# Patient Record
Sex: Female | Born: 1945 | Race: White | Hispanic: No | Smoking: Former smoker
Health system: Southern US, Community
[De-identification: ages and names within clinical notes are randomized; demographics above are authoritative.]

## PROBLEM LIST (undated history)

## (undated) DIAGNOSIS — Z972 Presence of dental prosthetic device (complete) (partial): Secondary | ICD-10-CM

## (undated) DIAGNOSIS — J309 Allergic rhinitis, unspecified: Secondary | ICD-10-CM

## (undated) DIAGNOSIS — I639 Cerebral infarction, unspecified: Secondary | ICD-10-CM

## (undated) DIAGNOSIS — I10 Essential (primary) hypertension: Secondary | ICD-10-CM

## (undated) DIAGNOSIS — J189 Pneumonia, unspecified organism: Secondary | ICD-10-CM

## (undated) DIAGNOSIS — R7303 Prediabetes: Secondary | ICD-10-CM

## (undated) DIAGNOSIS — D6851 Activated protein C resistance: Secondary | ICD-10-CM

## (undated) DIAGNOSIS — C801 Malignant (primary) neoplasm, unspecified: Secondary | ICD-10-CM

## (undated) DIAGNOSIS — R011 Cardiac murmur, unspecified: Secondary | ICD-10-CM

## (undated) DIAGNOSIS — E669 Obesity, unspecified: Secondary | ICD-10-CM

## (undated) DIAGNOSIS — E785 Hyperlipidemia, unspecified: Secondary | ICD-10-CM

## (undated) DIAGNOSIS — Z8489 Family history of other specified conditions: Secondary | ICD-10-CM

## (undated) DIAGNOSIS — R760 Raised antibody titer: Secondary | ICD-10-CM

## (undated) DIAGNOSIS — D759 Disease of blood and blood-forming organs, unspecified: Secondary | ICD-10-CM

## (undated) DIAGNOSIS — D689 Coagulation defect, unspecified: Secondary | ICD-10-CM

## (undated) DIAGNOSIS — E119 Type 2 diabetes mellitus without complications: Secondary | ICD-10-CM

## (undated) HISTORY — DX: Obesity, unspecified: E66.9

## (undated) HISTORY — DX: Allergic rhinitis, unspecified: J30.9

## (undated) HISTORY — DX: Hyperlipidemia, unspecified: E78.5

## (undated) HISTORY — DX: Raised antibody titer: R76.0

## (undated) HISTORY — PX: APPENDECTOMY: SHX54

## (undated) HISTORY — DX: Cerebral infarction, unspecified: I63.9

## (undated) HISTORY — DX: Type 2 diabetes mellitus without complications: E11.9

## (undated) HISTORY — DX: Coagulation defect, unspecified: D68.9

## (undated) HISTORY — DX: Essential (primary) hypertension: I10

## (undated) HISTORY — PX: DILATION AND CURETTAGE OF UTERUS: SHX78

---

## 1979-10-15 HISTORY — PX: ABDOMINAL HYSTERECTOMY: SHX81

## 2008-10-14 DIAGNOSIS — I639 Cerebral infarction, unspecified: Secondary | ICD-10-CM

## 2008-10-14 HISTORY — DX: Cerebral infarction, unspecified: I63.9

## 2009-08-30 ENCOUNTER — Inpatient Hospital Stay (HOSPITAL_COMMUNITY): Admission: EM | Admit: 2009-08-30 | Discharge: 2009-09-05 | Payer: Self-pay | Admitting: Emergency Medicine

## 2009-08-31 ENCOUNTER — Encounter (INDEPENDENT_AMBULATORY_CARE_PROVIDER_SITE_OTHER): Payer: Self-pay | Admitting: Internal Medicine

## 2009-09-01 ENCOUNTER — Encounter (INDEPENDENT_AMBULATORY_CARE_PROVIDER_SITE_OTHER): Payer: Self-pay | Admitting: Internal Medicine

## 2009-09-01 ENCOUNTER — Ambulatory Visit: Payer: Self-pay | Admitting: Physical Medicine & Rehabilitation

## 2009-09-05 ENCOUNTER — Ambulatory Visit: Payer: Self-pay | Admitting: Physical Medicine & Rehabilitation

## 2009-09-05 ENCOUNTER — Encounter (INDEPENDENT_AMBULATORY_CARE_PROVIDER_SITE_OTHER): Payer: Self-pay | Admitting: Internal Medicine

## 2009-09-05 ENCOUNTER — Ambulatory Visit: Payer: Self-pay | Admitting: Cardiology

## 2009-09-05 ENCOUNTER — Inpatient Hospital Stay (HOSPITAL_COMMUNITY)
Admission: RE | Admit: 2009-09-05 | Discharge: 2009-09-20 | Payer: Self-pay | Admitting: Physical Medicine & Rehabilitation

## 2009-09-14 ENCOUNTER — Ambulatory Visit: Payer: Self-pay | Admitting: Psychology

## 2009-10-18 ENCOUNTER — Encounter
Admission: RE | Admit: 2009-10-18 | Discharge: 2010-01-16 | Payer: Self-pay | Admitting: Physical Medicine & Rehabilitation

## 2009-10-18 ENCOUNTER — Ambulatory Visit: Payer: Self-pay | Admitting: Physical Medicine & Rehabilitation

## 2009-11-08 ENCOUNTER — Ambulatory Visit: Payer: Self-pay | Admitting: Psychology

## 2009-11-29 ENCOUNTER — Ambulatory Visit: Payer: Self-pay | Admitting: Physical Medicine & Rehabilitation

## 2010-07-09 IMAGING — CR DG WRIST 2V*R*
2 series · 2 of 2 positions shown · non-contrast
Comparison: 09/01/2009

CLINICAL DATA: Fracture

RIGHT WRIST - 2 VIEW

[x wrist pa right *]
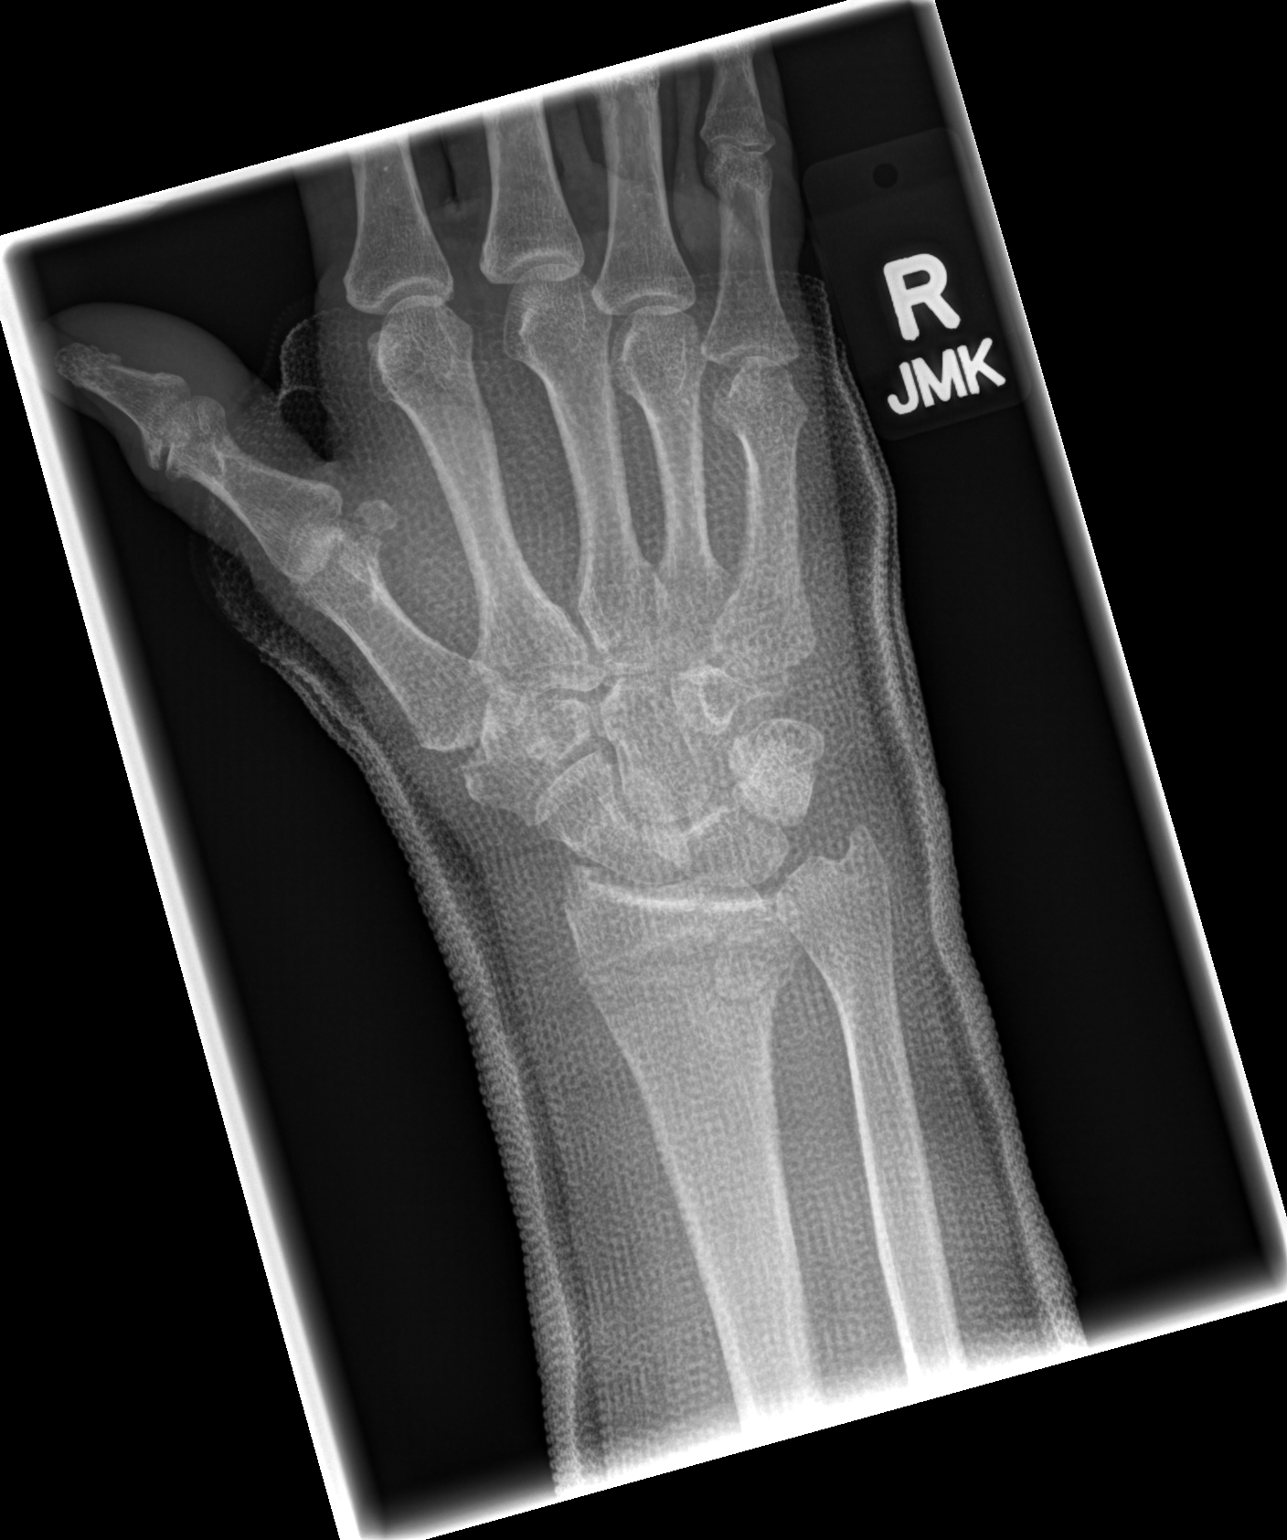

[x wrist lat right *]
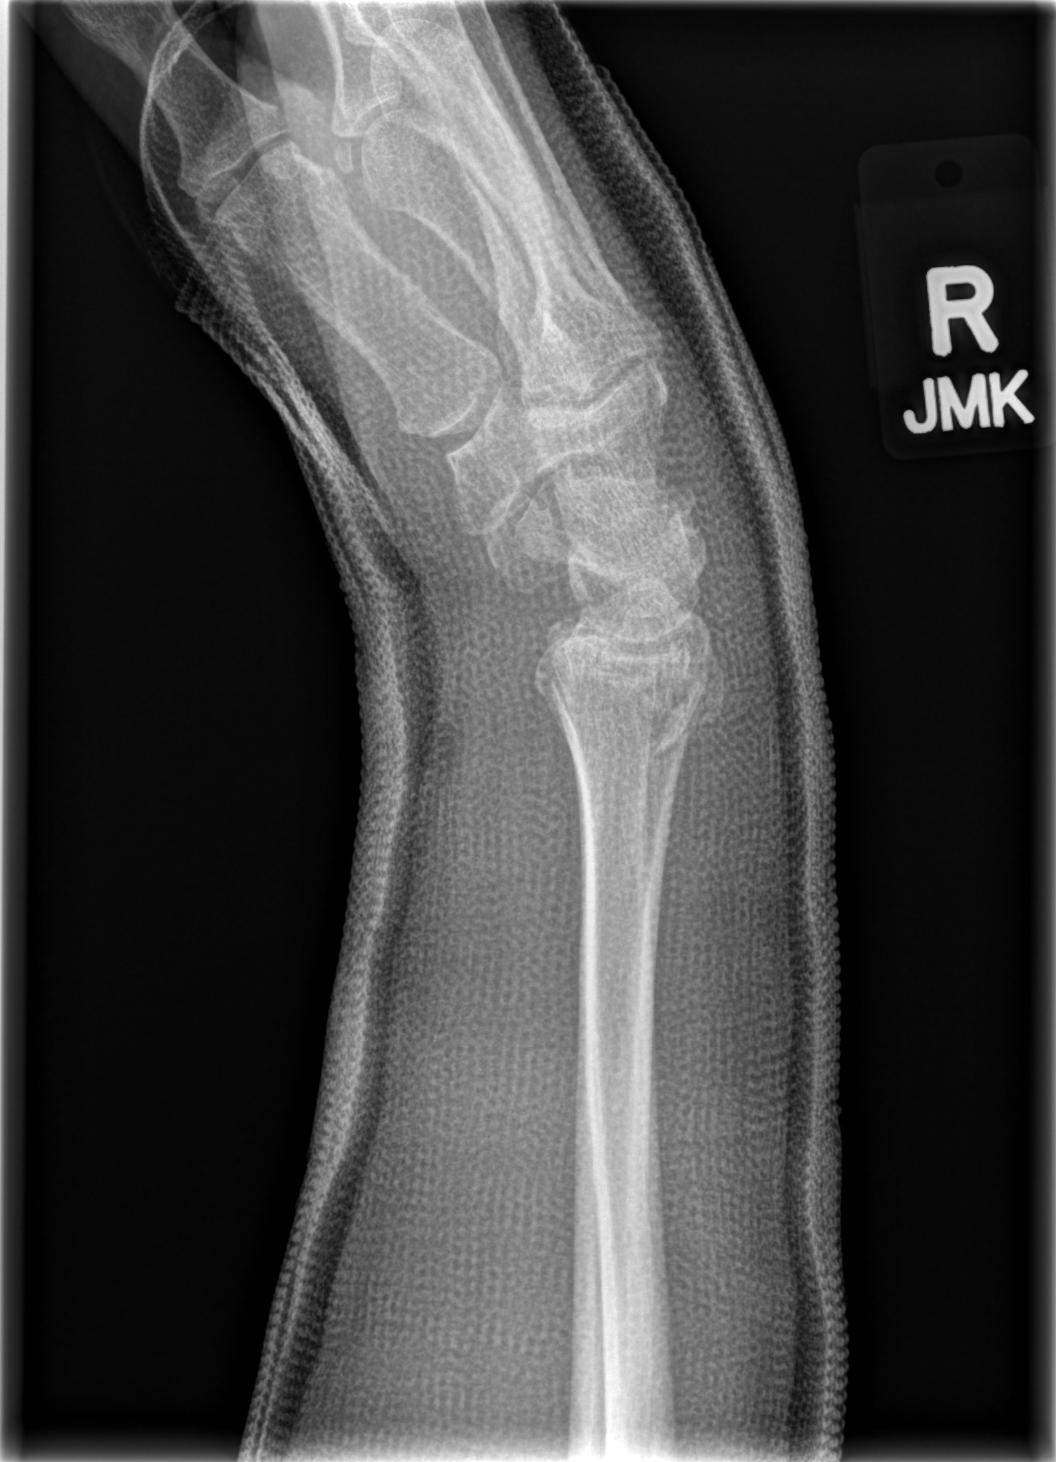

[2 of 2 positions shown; findings below may reference images not displayed]

FINDINGS: Cast material obscures fine bone detail.  The comminuted
distal right radial metaphyseal fracture is in near anatomic
alignment with neutral angulation of the distal radial articular
surface.  Carpal rows appear intact.  Diffuse osteopenia.
IMPRESSION: Stable fixation of distal radial fracture

## 2010-11-04 ENCOUNTER — Encounter: Payer: Self-pay | Admitting: Physical Medicine & Rehabilitation

## 2011-01-07 ENCOUNTER — Emergency Department: Payer: Self-pay | Admitting: Emergency Medicine

## 2011-01-15 LAB — PROTIME-INR
INR: 2.16 — ABNORMAL HIGH (ref 0.00–1.49)
INR: 2.17 — ABNORMAL HIGH (ref 0.00–1.49)
INR: 2.56 — ABNORMAL HIGH (ref 0.00–1.49)
INR: 2.89 — ABNORMAL HIGH (ref 0.00–1.49)
INR: 3.01 — ABNORMAL HIGH (ref 0.00–1.49)
Prothrombin Time: 23.9 seconds — ABNORMAL HIGH (ref 11.6–15.2)
Prothrombin Time: 24 seconds — ABNORMAL HIGH (ref 11.6–15.2)
Prothrombin Time: 25.8 seconds — ABNORMAL HIGH (ref 11.6–15.2)
Prothrombin Time: 26.1 seconds — ABNORMAL HIGH (ref 11.6–15.2)
Prothrombin Time: 27.1 seconds — ABNORMAL HIGH (ref 11.6–15.2)
Prothrombin Time: 30 seconds — ABNORMAL HIGH (ref 11.6–15.2)
Prothrombin Time: 31 seconds — ABNORMAL HIGH (ref 11.6–15.2)

## 2011-01-15 LAB — CBC
MCHC: 34.2 g/dL (ref 30.0–36.0)
MCV: 95.3 fL (ref 78.0–100.0)
RBC: 4.23 MIL/uL (ref 3.87–5.11)
RDW: 13.1 % (ref 11.5–15.5)

## 2011-01-16 LAB — URINE CULTURE: Colony Count: >=100000

## 2011-01-16 LAB — PROTIME-INR
INR: 1.06 (ref 0.00–1.49)
INR: 1.31 (ref 0.00–1.49)
INR: 1.85 — ABNORMAL HIGH (ref 0.00–1.49)
INR: 2.61 — ABNORMAL HIGH (ref 0.00–1.49)
Prothrombin Time: 12.9 seconds (ref 11.6–15.2)
Prothrombin Time: 13.7 seconds (ref 11.6–15.2)
Prothrombin Time: 16.2 seconds — ABNORMAL HIGH (ref 11.6–15.2)
Prothrombin Time: 22.3 seconds — ABNORMAL HIGH (ref 11.6–15.2)

## 2011-01-16 LAB — CBC
HCT: 39.6 % (ref 36.0–46.0)
HCT: 40.3 % (ref 36.0–46.0)
HCT: 43 % (ref 36.0–46.0)
Hemoglobin: 13.4 g/dL (ref 12.0–15.0)
Hemoglobin: 13.6 g/dL (ref 12.0–15.0)
Hemoglobin: 13.8 g/dL (ref 12.0–15.0)
Hemoglobin: 14.1 g/dL (ref 12.0–15.0)
MCHC: 33.8 g/dL (ref 30.0–36.0)
MCHC: 34.2 g/dL (ref 30.0–36.0)
MCHC: 34.2 g/dL (ref 30.0–36.0)
MCHC: 34.3 g/dL (ref 30.0–36.0)
MCV: 95.8 fL (ref 78.0–100.0)
MCV: 95.9 fL (ref 78.0–100.0)
MCV: 96.3 fL (ref 78.0–100.0)
Platelets: 214 10*3/uL (ref 150–400)
RBC: 4.06 MIL/uL (ref 3.87–5.11)
RBC: 4.13 MIL/uL (ref 3.87–5.11)
RBC: 4.18 MIL/uL (ref 3.87–5.11)
RBC: 4.31 MIL/uL (ref 3.87–5.11)
RBC: 4.45 MIL/uL (ref 3.87–5.11)
RDW: 12.9 % (ref 11.5–15.5)
RDW: 13.2 % (ref 11.5–15.5)
RDW: 13.2 % (ref 11.5–15.5)
WBC: 8.5 10*3/uL (ref 4.0–10.5)
WBC: 8.7 10*3/uL (ref 4.0–10.5)
WBC: 9 10*3/uL (ref 4.0–10.5)
WBC: 9.4 10*3/uL (ref 4.0–10.5)

## 2011-01-16 LAB — LIPID PANEL: VLDL: 16 mg/dL (ref 0–40)

## 2011-01-16 LAB — URINALYSIS, ROUTINE W REFLEX MICROSCOPIC
Bilirubin Urine: NEGATIVE
Glucose, UA: NEGATIVE mg/dL
Hgb urine dipstick: NEGATIVE
Ketones, ur: 40 mg/dL — AB
Ketones, ur: NEGATIVE mg/dL
Nitrite: POSITIVE — AB
Protein, ur: NEGATIVE mg/dL
Specific Gravity, Urine: 1.013 (ref 1.005–1.030)
Urobilinogen, UA: 1 mg/dL (ref 0.0–1.0)
pH: 7 (ref 5.0–8.0)

## 2011-01-16 LAB — COMPREHENSIVE METABOLIC PANEL
ALT: 31 U/L (ref 0–35)
AST: 21 U/L (ref 0–37)
AST: 25 U/L (ref 0–37)
Alkaline Phosphatase: 77 U/L (ref 39–117)
BUN: 14 mg/dL (ref 6–23)
CO2: 25 mEq/L (ref 19–32)
CO2: 26 mEq/L (ref 19–32)
CO2: 28 mEq/L (ref 19–32)
Calcium: 8.7 mg/dL (ref 8.4–10.5)
Calcium: 8.9 mg/dL (ref 8.4–10.5)
Calcium: 9.1 mg/dL (ref 8.4–10.5)
Chloride: 107 mEq/L (ref 96–112)
Chloride: 110 mEq/L (ref 96–112)
Creatinine, Ser: 0.7 mg/dL (ref 0.4–1.2)
Creatinine, Ser: 0.71 mg/dL (ref 0.4–1.2)
GFR calc Af Amer: >60 mL/min (ref >60)
GFR calc Af Amer: >60 mL/min (ref >60)
GFR calc non Af Amer: >60 mL/min (ref >60)
GFR calc non Af Amer: >60 mL/min (ref >60)
GFR calc non Af Amer: >60 mL/min (ref >60)
Glucose, Bld: 100 mg/dL — ABNORMAL HIGH (ref 70–99)
Glucose, Bld: 103 mg/dL — ABNORMAL HIGH (ref 70–99)
Potassium: 4.2 mEq/L (ref 3.5–5.1)
Sodium: 142 mEq/L (ref 135–145)
Total Bilirubin: 0.5 mg/dL (ref 0.3–1.2)
Total Bilirubin: 0.5 mg/dL (ref 0.3–1.2)
Total Protein: 6.2 g/dL (ref 6.0–8.3)

## 2011-01-16 LAB — BASIC METABOLIC PANEL
CO2: 24 mEq/L (ref 19–32)
CO2: 24 mEq/L (ref 19–32)
Calcium: 8.2 mg/dL — ABNORMAL LOW (ref 8.4–10.5)
Calcium: 8.5 mg/dL (ref 8.4–10.5)
Creatinine, Ser: 0.77 mg/dL (ref 0.4–1.2)
GFR calc Af Amer: >60 mL/min (ref >60)
GFR calc Af Amer: >60 mL/min (ref >60)
GFR calc non Af Amer: >60 mL/min (ref >60)
GFR calc non Af Amer: >60 mL/min (ref >60)
GFR calc non Af Amer: >60 mL/min (ref >60)
Glucose, Bld: 110 mg/dL — ABNORMAL HIGH (ref 70–99)
Potassium: 3.6 mEq/L (ref 3.5–5.1)
Sodium: 140 mEq/L (ref 135–145)
Sodium: 142 mEq/L (ref 135–145)

## 2011-01-16 LAB — ANA: Anti Nuclear Antibody(ANA): NEGATIVE

## 2011-01-16 LAB — URINE MICROSCOPIC-ADD ON

## 2011-01-16 LAB — POCT I-STAT, CHEM 8
BUN: 16 mg/dL (ref 6–23)
Calcium, Ion: 1.1 mmol/L — ABNORMAL LOW (ref 1.12–1.32)
Creatinine, Ser: 0.7 mg/dL (ref 0.4–1.2)
Hemoglobin: 16 g/dL — ABNORMAL HIGH (ref 12.0–15.0)
Sodium: 141 mEq/L (ref 135–145)
TCO2: 28 mmol/L (ref 0–100)

## 2011-01-16 LAB — BETA-2-GLYCOPROTEIN I ABS, IGG/M/A
Beta-2 Glyco I IgG: <3 U/mL (ref <=15)
Beta-2-Glycoprotein I IgA: <3 U/mL (ref <=15)
Beta-2-Glycoprotein I IgM: 34 U/mL — ABNORMAL HIGH (ref <=15)

## 2011-01-16 LAB — GLUCOSE, CAPILLARY
Glucose-Capillary: 101 mg/dL — ABNORMAL HIGH (ref 70–99)
Glucose-Capillary: 89 mg/dL (ref 70–99)

## 2011-01-16 LAB — CK TOTAL AND CKMB (NOT AT ARMC): Total CK: 1002 U/L — ABNORMAL HIGH (ref 7–177)

## 2011-01-16 LAB — PROTEIN C ACTIVITY: Protein C Activity: 106 % (ref 75–133)

## 2011-01-16 LAB — LUPUS ANTICOAGULANT PANEL
DRVVT: 61.3 secs — ABNORMAL HIGH (ref 34.7–40.5)
Lupus Anticoagulant: DETECTED — AB

## 2011-01-16 LAB — SEDIMENTATION RATE
Sed Rate: 6 mm/hr (ref 0–22)
Sed Rate: 9 mm/hr (ref 0–22)

## 2011-01-16 LAB — FACTOR 5 LEIDEN

## 2011-01-16 LAB — DIFFERENTIAL
Basophils Absolute: 0 10*3/uL (ref 0.0–0.1)
Lymphocytes Relative: 13 % (ref 12–46)
Lymphocytes Relative: 34 % (ref 12–46)
Lymphs Abs: 1.2 10*3/uL (ref 0.7–4.0)
Lymphs Abs: 2.9 10*3/uL (ref 0.7–4.0)
Monocytes Relative: 8 % (ref 3–12)
Neutrophils Relative %: 55 % (ref 43–77)
Neutrophils Relative %: 81 % — ABNORMAL HIGH (ref 43–77)

## 2011-01-16 LAB — PROTEIN S ACTIVITY: Protein S Activity: 70 % (ref 69–129)

## 2011-01-16 LAB — ANTI-SMITH ANTIBODY: ENA SM Ab Ser-aCnc: <0.2 AI (ref <1.0)

## 2011-01-16 LAB — PROTEIN C, TOTAL: Protein C, Total: 95 % (ref 70–140)

## 2011-01-16 LAB — PROTHROMBIN GENE MUTATION

## 2011-01-16 LAB — HEMOGLOBIN A1C: Hgb A1c MFr Bld: 5.9 % (ref 4.6–6.1)

## 2011-01-16 LAB — ANTITHROMBIN III: AntiThromb III Func: 86 % (ref 76–126)

## 2011-01-16 LAB — TROPONIN I: Troponin I: 0.03 ng/mL (ref 0.00–0.06)

## 2011-01-16 LAB — HOMOCYSTEINE: Homocysteine: 7.5 umol/L (ref 4.0–15.4)

## 2011-10-21 DIAGNOSIS — IMO0002 Reserved for concepts with insufficient information to code with codable children: Secondary | ICD-10-CM | POA: Diagnosis not present

## 2011-10-31 DIAGNOSIS — I69959 Hemiplegia and hemiparesis following unspecified cerebrovascular disease affecting unspecified side: Secondary | ICD-10-CM | POA: Diagnosis not present

## 2011-10-31 DIAGNOSIS — Z7901 Long term (current) use of anticoagulants: Secondary | ICD-10-CM | POA: Diagnosis not present

## 2011-10-31 DIAGNOSIS — D689 Coagulation defect, unspecified: Secondary | ICD-10-CM | POA: Diagnosis not present

## 2011-11-20 DIAGNOSIS — E119 Type 2 diabetes mellitus without complications: Secondary | ICD-10-CM | POA: Diagnosis not present

## 2011-11-20 DIAGNOSIS — Z7901 Long term (current) use of anticoagulants: Secondary | ICD-10-CM | POA: Diagnosis not present

## 2011-11-20 DIAGNOSIS — E785 Hyperlipidemia, unspecified: Secondary | ICD-10-CM | POA: Diagnosis not present

## 2011-11-20 DIAGNOSIS — I10 Essential (primary) hypertension: Secondary | ICD-10-CM | POA: Diagnosis not present

## 2011-12-24 DIAGNOSIS — D689 Coagulation defect, unspecified: Secondary | ICD-10-CM | POA: Diagnosis not present

## 2011-12-24 DIAGNOSIS — I69959 Hemiplegia and hemiparesis following unspecified cerebrovascular disease affecting unspecified side: Secondary | ICD-10-CM | POA: Diagnosis not present

## 2011-12-24 DIAGNOSIS — Z7901 Long term (current) use of anticoagulants: Secondary | ICD-10-CM | POA: Diagnosis not present

## 2012-01-23 DIAGNOSIS — D689 Coagulation defect, unspecified: Secondary | ICD-10-CM | POA: Diagnosis not present

## 2012-01-23 DIAGNOSIS — Z7901 Long term (current) use of anticoagulants: Secondary | ICD-10-CM | POA: Diagnosis not present

## 2012-01-23 DIAGNOSIS — I69959 Hemiplegia and hemiparesis following unspecified cerebrovascular disease affecting unspecified side: Secondary | ICD-10-CM | POA: Diagnosis not present

## 2012-04-02 DIAGNOSIS — Z7901 Long term (current) use of anticoagulants: Secondary | ICD-10-CM | POA: Diagnosis not present

## 2012-04-02 DIAGNOSIS — I69959 Hemiplegia and hemiparesis following unspecified cerebrovascular disease affecting unspecified side: Secondary | ICD-10-CM | POA: Diagnosis not present

## 2012-04-02 DIAGNOSIS — D689 Coagulation defect, unspecified: Secondary | ICD-10-CM | POA: Diagnosis not present

## 2012-05-08 DIAGNOSIS — B029 Zoster without complications: Secondary | ICD-10-CM | POA: Diagnosis not present

## 2012-05-08 DIAGNOSIS — Z7901 Long term (current) use of anticoagulants: Secondary | ICD-10-CM | POA: Diagnosis not present

## 2012-05-08 DIAGNOSIS — D689 Coagulation defect, unspecified: Secondary | ICD-10-CM | POA: Diagnosis not present

## 2012-05-08 DIAGNOSIS — I10 Essential (primary) hypertension: Secondary | ICD-10-CM | POA: Diagnosis not present

## 2013-01-27 DIAGNOSIS — IMO0002 Reserved for concepts with insufficient information to code with codable children: Secondary | ICD-10-CM | POA: Diagnosis not present

## 2013-02-02 DIAGNOSIS — IMO0002 Reserved for concepts with insufficient information to code with codable children: Secondary | ICD-10-CM | POA: Diagnosis not present

## 2013-02-04 DIAGNOSIS — IMO0002 Reserved for concepts with insufficient information to code with codable children: Secondary | ICD-10-CM | POA: Diagnosis not present

## 2013-02-08 DIAGNOSIS — IMO0002 Reserved for concepts with insufficient information to code with codable children: Secondary | ICD-10-CM | POA: Diagnosis not present

## 2013-02-15 DIAGNOSIS — IMO0002 Reserved for concepts with insufficient information to code with codable children: Secondary | ICD-10-CM | POA: Diagnosis not present

## 2013-02-17 DIAGNOSIS — IMO0002 Reserved for concepts with insufficient information to code with codable children: Secondary | ICD-10-CM | POA: Diagnosis not present

## 2013-02-22 DIAGNOSIS — IMO0002 Reserved for concepts with insufficient information to code with codable children: Secondary | ICD-10-CM | POA: Diagnosis not present

## 2013-02-24 DIAGNOSIS — IMO0002 Reserved for concepts with insufficient information to code with codable children: Secondary | ICD-10-CM | POA: Diagnosis not present

## 2013-03-01 DIAGNOSIS — IMO0002 Reserved for concepts with insufficient information to code with codable children: Secondary | ICD-10-CM | POA: Diagnosis not present

## 2013-03-16 DIAGNOSIS — IMO0002 Reserved for concepts with insufficient information to code with codable children: Secondary | ICD-10-CM | POA: Diagnosis not present

## 2013-03-23 DIAGNOSIS — IMO0002 Reserved for concepts with insufficient information to code with codable children: Secondary | ICD-10-CM | POA: Diagnosis not present

## 2013-03-25 DIAGNOSIS — IMO0002 Reserved for concepts with insufficient information to code with codable children: Secondary | ICD-10-CM | POA: Diagnosis not present

## 2013-03-25 DIAGNOSIS — I69959 Hemiplegia and hemiparesis following unspecified cerebrovascular disease affecting unspecified side: Secondary | ICD-10-CM | POA: Diagnosis not present

## 2013-03-25 DIAGNOSIS — Z7901 Long term (current) use of anticoagulants: Secondary | ICD-10-CM | POA: Diagnosis not present

## 2013-03-25 DIAGNOSIS — D689 Coagulation defect, unspecified: Secondary | ICD-10-CM | POA: Diagnosis not present

## 2013-03-30 DIAGNOSIS — IMO0002 Reserved for concepts with insufficient information to code with codable children: Secondary | ICD-10-CM | POA: Diagnosis not present

## 2013-04-01 DIAGNOSIS — IMO0002 Reserved for concepts with insufficient information to code with codable children: Secondary | ICD-10-CM | POA: Diagnosis not present

## 2013-04-26 DIAGNOSIS — Z7901 Long term (current) use of anticoagulants: Secondary | ICD-10-CM | POA: Diagnosis not present

## 2013-04-26 DIAGNOSIS — I69959 Hemiplegia and hemiparesis following unspecified cerebrovascular disease affecting unspecified side: Secondary | ICD-10-CM | POA: Diagnosis not present

## 2013-05-25 DIAGNOSIS — D689 Coagulation defect, unspecified: Secondary | ICD-10-CM | POA: Diagnosis not present

## 2013-05-25 DIAGNOSIS — I69959 Hemiplegia and hemiparesis following unspecified cerebrovascular disease affecting unspecified side: Secondary | ICD-10-CM | POA: Diagnosis not present

## 2013-05-25 DIAGNOSIS — Z7901 Long term (current) use of anticoagulants: Secondary | ICD-10-CM | POA: Diagnosis not present

## 2013-06-18 DIAGNOSIS — E119 Type 2 diabetes mellitus without complications: Secondary | ICD-10-CM | POA: Diagnosis not present

## 2013-06-29 DIAGNOSIS — Z23 Encounter for immunization: Secondary | ICD-10-CM | POA: Diagnosis not present

## 2013-06-29 DIAGNOSIS — I69959 Hemiplegia and hemiparesis following unspecified cerebrovascular disease affecting unspecified side: Secondary | ICD-10-CM | POA: Diagnosis not present

## 2013-06-29 DIAGNOSIS — Z7901 Long term (current) use of anticoagulants: Secondary | ICD-10-CM | POA: Diagnosis not present

## 2013-06-29 DIAGNOSIS — D689 Coagulation defect, unspecified: Secondary | ICD-10-CM | POA: Diagnosis not present

## 2013-07-19 ENCOUNTER — Ambulatory Visit: Payer: Self-pay | Admitting: Internal Medicine

## 2013-07-19 DIAGNOSIS — Z1231 Encounter for screening mammogram for malignant neoplasm of breast: Secondary | ICD-10-CM | POA: Diagnosis not present

## 2013-08-03 DIAGNOSIS — I69959 Hemiplegia and hemiparesis following unspecified cerebrovascular disease affecting unspecified side: Secondary | ICD-10-CM | POA: Diagnosis not present

## 2013-08-03 DIAGNOSIS — D689 Coagulation defect, unspecified: Secondary | ICD-10-CM | POA: Diagnosis not present

## 2013-08-03 DIAGNOSIS — Z7901 Long term (current) use of anticoagulants: Secondary | ICD-10-CM | POA: Diagnosis not present

## 2013-09-02 DIAGNOSIS — I69959 Hemiplegia and hemiparesis following unspecified cerebrovascular disease affecting unspecified side: Secondary | ICD-10-CM | POA: Diagnosis not present

## 2013-09-02 DIAGNOSIS — E119 Type 2 diabetes mellitus without complications: Secondary | ICD-10-CM | POA: Diagnosis not present

## 2013-09-02 DIAGNOSIS — I10 Essential (primary) hypertension: Secondary | ICD-10-CM | POA: Diagnosis not present

## 2013-09-02 DIAGNOSIS — Z7901 Long term (current) use of anticoagulants: Secondary | ICD-10-CM | POA: Diagnosis not present

## 2013-09-02 DIAGNOSIS — E785 Hyperlipidemia, unspecified: Secondary | ICD-10-CM | POA: Diagnosis not present

## 2013-09-13 ENCOUNTER — Encounter: Payer: Self-pay | Admitting: Internal Medicine

## 2013-09-13 DIAGNOSIS — R269 Unspecified abnormalities of gait and mobility: Secondary | ICD-10-CM | POA: Diagnosis not present

## 2013-09-13 DIAGNOSIS — E785 Hyperlipidemia, unspecified: Secondary | ICD-10-CM | POA: Diagnosis not present

## 2013-09-13 DIAGNOSIS — Z7901 Long term (current) use of anticoagulants: Secondary | ICD-10-CM | POA: Diagnosis not present

## 2013-09-13 DIAGNOSIS — E669 Obesity, unspecified: Secondary | ICD-10-CM | POA: Diagnosis not present

## 2013-09-13 DIAGNOSIS — Z780 Asymptomatic menopausal state: Secondary | ICD-10-CM | POA: Diagnosis not present

## 2013-09-13 DIAGNOSIS — Z1331 Encounter for screening for depression: Secondary | ICD-10-CM | POA: Diagnosis not present

## 2013-09-13 DIAGNOSIS — E1169 Type 2 diabetes mellitus with other specified complication: Secondary | ICD-10-CM | POA: Diagnosis not present

## 2013-09-13 DIAGNOSIS — Z0000 Encounter for general adult medical examination without abnormal findings: Secondary | ICD-10-CM | POA: Diagnosis not present

## 2013-09-13 DIAGNOSIS — M25559 Pain in unspecified hip: Secondary | ICD-10-CM | POA: Diagnosis not present

## 2013-09-16 DIAGNOSIS — Z1212 Encounter for screening for malignant neoplasm of rectum: Secondary | ICD-10-CM | POA: Diagnosis not present

## 2013-09-30 DIAGNOSIS — I69959 Hemiplegia and hemiparesis following unspecified cerebrovascular disease affecting unspecified side: Secondary | ICD-10-CM | POA: Diagnosis not present

## 2013-09-30 DIAGNOSIS — D689 Coagulation defect, unspecified: Secondary | ICD-10-CM | POA: Diagnosis not present

## 2013-09-30 DIAGNOSIS — Z7901 Long term (current) use of anticoagulants: Secondary | ICD-10-CM | POA: Diagnosis not present

## 2013-10-10 DIAGNOSIS — L049 Acute lymphadenitis, unspecified: Secondary | ICD-10-CM | POA: Diagnosis not present

## 2013-10-20 DIAGNOSIS — F329 Major depressive disorder, single episode, unspecified: Secondary | ICD-10-CM | POA: Diagnosis not present

## 2013-10-20 DIAGNOSIS — Z7901 Long term (current) use of anticoagulants: Secondary | ICD-10-CM | POA: Diagnosis not present

## 2013-10-20 DIAGNOSIS — F3289 Other specified depressive episodes: Secondary | ICD-10-CM | POA: Diagnosis not present

## 2013-10-20 DIAGNOSIS — E785 Hyperlipidemia, unspecified: Secondary | ICD-10-CM | POA: Diagnosis not present

## 2013-10-20 DIAGNOSIS — I10 Essential (primary) hypertension: Secondary | ICD-10-CM | POA: Diagnosis not present

## 2013-10-20 DIAGNOSIS — Z6831 Body mass index (BMI) 31.0-31.9, adult: Secondary | ICD-10-CM | POA: Diagnosis not present

## 2013-10-20 DIAGNOSIS — D689 Coagulation defect, unspecified: Secondary | ICD-10-CM | POA: Diagnosis not present

## 2013-10-22 ENCOUNTER — Ambulatory Visit: Payer: Self-pay | Admitting: Internal Medicine

## 2013-11-02 DIAGNOSIS — IMO0002 Reserved for concepts with insufficient information to code with codable children: Secondary | ICD-10-CM | POA: Diagnosis not present

## 2013-11-04 DIAGNOSIS — D689 Coagulation defect, unspecified: Secondary | ICD-10-CM | POA: Diagnosis not present

## 2013-11-04 DIAGNOSIS — Z7901 Long term (current) use of anticoagulants: Secondary | ICD-10-CM | POA: Diagnosis not present

## 2013-11-04 DIAGNOSIS — I69959 Hemiplegia and hemiparesis following unspecified cerebrovascular disease affecting unspecified side: Secondary | ICD-10-CM | POA: Diagnosis not present

## 2013-11-04 DIAGNOSIS — IMO0002 Reserved for concepts with insufficient information to code with codable children: Secondary | ICD-10-CM | POA: Diagnosis not present

## 2013-11-09 DIAGNOSIS — IMO0002 Reserved for concepts with insufficient information to code with codable children: Secondary | ICD-10-CM | POA: Diagnosis not present

## 2013-11-11 DIAGNOSIS — IMO0002 Reserved for concepts with insufficient information to code with codable children: Secondary | ICD-10-CM | POA: Diagnosis not present

## 2013-11-16 DIAGNOSIS — IMO0002 Reserved for concepts with insufficient information to code with codable children: Secondary | ICD-10-CM | POA: Diagnosis not present

## 2013-11-18 DIAGNOSIS — IMO0002 Reserved for concepts with insufficient information to code with codable children: Secondary | ICD-10-CM | POA: Diagnosis not present

## 2013-11-23 DIAGNOSIS — IMO0002 Reserved for concepts with insufficient information to code with codable children: Secondary | ICD-10-CM | POA: Diagnosis not present

## 2013-11-25 DIAGNOSIS — IMO0002 Reserved for concepts with insufficient information to code with codable children: Secondary | ICD-10-CM | POA: Diagnosis not present

## 2013-12-08 DIAGNOSIS — IMO0002 Reserved for concepts with insufficient information to code with codable children: Secondary | ICD-10-CM | POA: Diagnosis not present

## 2013-12-14 DIAGNOSIS — Z7901 Long term (current) use of anticoagulants: Secondary | ICD-10-CM | POA: Diagnosis not present

## 2013-12-14 DIAGNOSIS — D689 Coagulation defect, unspecified: Secondary | ICD-10-CM | POA: Diagnosis not present

## 2013-12-14 DIAGNOSIS — I69959 Hemiplegia and hemiparesis following unspecified cerebrovascular disease affecting unspecified side: Secondary | ICD-10-CM | POA: Diagnosis not present

## 2013-12-14 DIAGNOSIS — IMO0002 Reserved for concepts with insufficient information to code with codable children: Secondary | ICD-10-CM | POA: Diagnosis not present

## 2013-12-16 DIAGNOSIS — IMO0002 Reserved for concepts with insufficient information to code with codable children: Secondary | ICD-10-CM | POA: Diagnosis not present

## 2013-12-28 DIAGNOSIS — IMO0002 Reserved for concepts with insufficient information to code with codable children: Secondary | ICD-10-CM | POA: Diagnosis not present

## 2013-12-30 DIAGNOSIS — IMO0002 Reserved for concepts with insufficient information to code with codable children: Secondary | ICD-10-CM | POA: Diagnosis not present

## 2014-01-05 DIAGNOSIS — IMO0002 Reserved for concepts with insufficient information to code with codable children: Secondary | ICD-10-CM | POA: Diagnosis not present

## 2014-01-06 DIAGNOSIS — IMO0002 Reserved for concepts with insufficient information to code with codable children: Secondary | ICD-10-CM | POA: Diagnosis not present

## 2014-01-11 DIAGNOSIS — IMO0002 Reserved for concepts with insufficient information to code with codable children: Secondary | ICD-10-CM | POA: Diagnosis not present

## 2014-01-13 DIAGNOSIS — Z7901 Long term (current) use of anticoagulants: Secondary | ICD-10-CM | POA: Diagnosis not present

## 2014-01-13 DIAGNOSIS — IMO0002 Reserved for concepts with insufficient information to code with codable children: Secondary | ICD-10-CM | POA: Diagnosis not present

## 2014-01-13 DIAGNOSIS — I69959 Hemiplegia and hemiparesis following unspecified cerebrovascular disease affecting unspecified side: Secondary | ICD-10-CM | POA: Diagnosis not present

## 2014-01-13 DIAGNOSIS — D689 Coagulation defect, unspecified: Secondary | ICD-10-CM | POA: Diagnosis not present

## 2014-01-18 DIAGNOSIS — IMO0002 Reserved for concepts with insufficient information to code with codable children: Secondary | ICD-10-CM | POA: Diagnosis not present

## 2014-02-03 DIAGNOSIS — Z7901 Long term (current) use of anticoagulants: Secondary | ICD-10-CM | POA: Diagnosis not present

## 2014-03-09 DIAGNOSIS — L0292 Furuncle, unspecified: Secondary | ICD-10-CM | POA: Diagnosis not present

## 2014-03-09 DIAGNOSIS — Z7901 Long term (current) use of anticoagulants: Secondary | ICD-10-CM | POA: Diagnosis not present

## 2014-03-09 DIAGNOSIS — Z6832 Body mass index (BMI) 32.0-32.9, adult: Secondary | ICD-10-CM | POA: Diagnosis not present

## 2014-03-09 DIAGNOSIS — I69959 Hemiplegia and hemiparesis following unspecified cerebrovascular disease affecting unspecified side: Secondary | ICD-10-CM | POA: Diagnosis not present

## 2014-03-09 DIAGNOSIS — L0293 Carbuncle, unspecified: Secondary | ICD-10-CM | POA: Diagnosis not present

## 2014-03-09 DIAGNOSIS — I10 Essential (primary) hypertension: Secondary | ICD-10-CM | POA: Diagnosis not present

## 2014-03-14 ENCOUNTER — Encounter (INDEPENDENT_AMBULATORY_CARE_PROVIDER_SITE_OTHER): Payer: Self-pay | Admitting: General Surgery

## 2014-03-14 ENCOUNTER — Ambulatory Visit (INDEPENDENT_AMBULATORY_CARE_PROVIDER_SITE_OTHER): Payer: Medicare Other | Admitting: General Surgery

## 2014-03-14 VITALS — BP 130/76 | HR 80 | Temp 97.5°F | Ht 63.0 in | Wt 182.0 lb

## 2014-03-14 DIAGNOSIS — IMO0002 Reserved for concepts with insufficient information to code with codable children: Secondary | ICD-10-CM

## 2014-03-14 DIAGNOSIS — L02419 Cutaneous abscess of limb, unspecified: Secondary | ICD-10-CM

## 2014-03-14 NOTE — Progress Notes (Signed)
Patient ID: [name], female   DOB: [birth year], 68 y.o.   MRN: [id number]  Chief Complaint  Patient presents with  . left arm abscess    HPI [name] is a 68 y.o. female.  Referred by Dr Crist Infante HPI This is a 68 year old female is on Coumadin for a clotting disorder. She has a history of couple of times of a left axillary abscess that has been drained. This recurs every time. The last time she still had a marble-sized area that was present. Last Tuesday she began noticing this area again. This has  increased in size and become increasingly tender. She was attempting some local therapy but has not been successful. She comes in today to discuss drainage. Past Medical History  Diagnosis Date  . Clotting disorder   . Diabetes mellitus without complication   . Hyperlipidemia   . Hypertension   . Stroke     Past Surgical History  Procedure Laterality Date  . Abdominal hysterectomy      Family History  Problem Relation Age of Onset  . Heart disease Mother   . COPD Father     Social History History  Substance Use Topics  . Smoking status: Former Smoker    Types: Cigarettes    Quit date: 03/14/2009  . Smokeless tobacco: Not on file  . Alcohol Use: No    Allergies  Allergen Reactions  . Codeine     Current Outpatient Prescriptions  Medication Sig Dispense Refill  . amLODipine (NORVASC) 5 MG tablet Take 5 mg by mouth daily.      Marland Kitchen aspirin 81 MG tablet Take 81 mg by mouth daily.      Marland Kitchen ezetimibe (ZETIA) 10 MG tablet Take 10 mg by mouth daily.      Marland Kitchen lisinopril (PRINIVIL,ZESTRIL) 10 MG tablet Take 10 mg by mouth daily.      . simvastatin (ZOCOR) 20 MG tablet Take 20 mg by mouth daily.       No current facility-administered medications for this visit.    Review of Systems Review of Systems  Constitutional: Negative for fever, chills and unexpected weight change.  HENT: Negative for congestion, hearing loss, sore throat, trouble swallowing and voice change.     Eyes: Negative for visual disturbance.  Respiratory: Negative for cough and wheezing.   Cardiovascular: Negative for chest pain, palpitations and leg swelling.  Gastrointestinal: Negative for nausea, vomiting, abdominal pain, diarrhea, constipation, blood in stool, abdominal distention and anal bleeding.  Genitourinary: Negative for hematuria, vaginal bleeding and difficulty urinating.  Musculoskeletal: Negative for arthralgias.  Skin: Negative for rash and wound.  Neurological: Negative for seizures, syncope and headaches.  Hematological: Negative for adenopathy. Does not bruise/bleed easily.  Psychiatric/Behavioral: Negative for confusion.    Blood pressure 130/76, pulse 80, temperature 97.5 F (36.4 C), height 5\' 3"  (1.6 m), weight 182 lb (82.555 kg).  Physical Exam Physical Exam  Vitals reviewed. Constitutional: She appears well-developed and well-nourished.  Pulmonary/Chest:      Data Reviewed Dr Beckie Salts notes  Assessment    Left axillary abscess     Plan    I discussed incision and drainage. I think it would be worse to stop her Coumadin as this should be fairly limited. I then cleansed the area. I then injected lidocaine. I then made a cruciate incision entering the contents which were.. This was then packed. A dressing was placed. She's going to remove the packing on Wednesday. I will then follow her up  in a couple weeks. I do think that she's going to need what appears to be a sebaceous cyst excised at some point also.        [name] 03/14/2014, 4:21 PM

## 2014-04-12 ENCOUNTER — Telehealth (INDEPENDENT_AMBULATORY_CARE_PROVIDER_SITE_OTHER): Payer: Self-pay | Admitting: General Surgery

## 2014-04-12 ENCOUNTER — Ambulatory Visit (INDEPENDENT_AMBULATORY_CARE_PROVIDER_SITE_OTHER): Payer: Medicare Other | Admitting: General Surgery

## 2014-04-12 ENCOUNTER — Encounter (INDEPENDENT_AMBULATORY_CARE_PROVIDER_SITE_OTHER): Payer: Self-pay | Admitting: General Surgery

## 2014-04-12 ENCOUNTER — Encounter (INDEPENDENT_AMBULATORY_CARE_PROVIDER_SITE_OTHER): Payer: Self-pay

## 2014-04-12 VITALS — BP 122/80 | HR 76 | Temp 97.0°F | Ht 63.0 in | Wt 179.0 lb

## 2014-04-12 DIAGNOSIS — L02419 Cutaneous abscess of limb, unspecified: Secondary | ICD-10-CM

## 2014-04-12 DIAGNOSIS — IMO0002 Reserved for concepts with insufficient information to code with codable children: Secondary | ICD-10-CM | POA: Diagnosis not present

## 2014-04-12 DIAGNOSIS — Z7901 Long term (current) use of anticoagulants: Secondary | ICD-10-CM | POA: Diagnosis not present

## 2014-04-12 NOTE — Progress Notes (Signed)
Subjective:     Patient ID: [name], female   DOB: 01-21-1946, 68 y.o.   MRN: [id number]  HPI This is a 68 year old female has a history of a clotting disorder as well as a stroke on Coumadin who presents after undergoing incision and drainage of a left axillary abscess. She's doing well after I did that. This has happened 3 times now. She returns today to discuss definitive therapy.  Review of Systems     Objective:   Physical Exam    Healed left axillary incision with no evidence of infection, there appeared to be about a 2 cm cyst which is likely a sebaceous cyst underlying the incision Assessment:     Status post incision and drainage of left axillary abscess, likely sebaceous cyst     Plan:     We discussed observation versus surgery. I think to prevent this from coming back again that she will need to have this area removed. We discussed excision under local anesthesia with monitored anesthesia care. I discussed the risks including bleeding, infection, recurrence. Were going to plan on doing this in August all discussed with Dr. Joylene Draft management of her anti-coagulation.

## 2014-04-12 NOTE — Telephone Encounter (Signed)
[name], from Dr. Silvestre Mesi office, called to let us know that we need to go ahead and get the patient scheduled for a date to remove the axillary abscess and then we need to call their office so they can put the patient in for them to see 6-8 days prior to in order to start her lovenox bridge.  Informed them that I would pass this message along to Lars Mage so that they can start the order forms and get a date for them.

## 2014-04-12 NOTE — Telephone Encounter (Signed)
LMOM for pt to call me b/c I want to give her the message below from Dr Perini's office and let her know that her surgical orders have been turned into scheduling.

## 2014-04-27 ENCOUNTER — Other Ambulatory Visit (INDEPENDENT_AMBULATORY_CARE_PROVIDER_SITE_OTHER): Payer: Self-pay | Admitting: General Surgery

## 2014-04-28 ENCOUNTER — Encounter (INDEPENDENT_AMBULATORY_CARE_PROVIDER_SITE_OTHER): Payer: Self-pay

## 2014-04-28 NOTE — Telephone Encounter (Signed)
LMOM with Estill Bamberg at Dr Perini's office's giving them the surgery date for pt on 05/16/14 with Dr Donne Hazel b/c they wanted to know in order to arrange the Lovenox bridge on pt.

## 2014-04-28 NOTE — Telephone Encounter (Signed)
LMOM for pt notifying her that I did call Dr Perini's office with the surgery date and that she should be getting a call from their office about arranging the Lovenox bridge. I advised pt that if she does not hear from Dr Perini's office in the next few days for her to call Dr Perini's office.

## 2014-05-10 ENCOUNTER — Encounter (HOSPITAL_BASED_OUTPATIENT_CLINIC_OR_DEPARTMENT_OTHER): Payer: Self-pay | Admitting: *Deleted

## 2014-05-10 DIAGNOSIS — D689 Coagulation defect, unspecified: Secondary | ICD-10-CM | POA: Diagnosis not present

## 2014-05-10 DIAGNOSIS — Z7901 Long term (current) use of anticoagulants: Secondary | ICD-10-CM | POA: Diagnosis not present

## 2014-05-10 DIAGNOSIS — I69959 Hemiplegia and hemiparesis following unspecified cerebrovascular disease affecting unspecified side: Secondary | ICD-10-CM | POA: Diagnosis not present

## 2014-05-10 NOTE — Progress Notes (Signed)
Pt hx cva-but lives independent living in [place] She drives and comes to [facility] for her doctors She will bridge lovenox -coumadin herself-saw dr perrini today To come Friday for labs pt

## 2014-05-13 ENCOUNTER — Encounter (HOSPITAL_BASED_OUTPATIENT_CLINIC_OR_DEPARTMENT_OTHER): Payer: Self-pay | Admitting: *Deleted

## 2014-05-13 ENCOUNTER — Encounter (HOSPITAL_BASED_OUTPATIENT_CLINIC_OR_DEPARTMENT_OTHER)
Admission: RE | Admit: 2014-05-13 | Discharge: 2014-05-13 | Disposition: A | Discharge status: Home / Self Care | Payer: Medicare Other | Source: Ambulatory Visit | Attending: General Surgery | Admitting: General Surgery

## 2014-05-13 DIAGNOSIS — E785 Hyperlipidemia, unspecified: Secondary | ICD-10-CM | POA: Diagnosis not present

## 2014-05-13 DIAGNOSIS — Z7901 Long term (current) use of anticoagulants: Secondary | ICD-10-CM | POA: Diagnosis not present

## 2014-05-13 DIAGNOSIS — Z01812 Encounter for preprocedural laboratory examination: Secondary | ICD-10-CM | POA: Diagnosis not present

## 2014-05-13 DIAGNOSIS — M216X9 Other acquired deformities of unspecified foot: Secondary | ICD-10-CM | POA: Diagnosis not present

## 2014-05-13 DIAGNOSIS — Z7982 Long term (current) use of aspirin: Secondary | ICD-10-CM | POA: Diagnosis not present

## 2014-05-13 DIAGNOSIS — Z8673 Personal history of transient ischemic attack (TIA), and cerebral infarction without residual deficits: Secondary | ICD-10-CM | POA: Diagnosis not present

## 2014-05-13 DIAGNOSIS — Z87891 Personal history of nicotine dependence: Secondary | ICD-10-CM | POA: Diagnosis not present

## 2014-05-13 DIAGNOSIS — E119 Type 2 diabetes mellitus without complications: Secondary | ICD-10-CM | POA: Diagnosis not present

## 2014-05-13 DIAGNOSIS — L723 Sebaceous cyst: Secondary | ICD-10-CM | POA: Diagnosis not present

## 2014-05-13 DIAGNOSIS — Z01810 Encounter for preprocedural cardiovascular examination: Secondary | ICD-10-CM | POA: Diagnosis not present

## 2014-05-13 DIAGNOSIS — I10 Essential (primary) hypertension: Secondary | ICD-10-CM | POA: Diagnosis not present

## 2014-05-13 LAB — BASIC METABOLIC PANEL
ANION GAP: 13 (ref 5–15)
BUN: 14 mg/dL (ref 6–23)
CALCIUM: 9.1 mg/dL (ref 8.4–10.5)
CO2: 24 meq/L (ref 19–32)
CREATININE: 0.77 mg/dL (ref 0.50–1.10)
Chloride: 102 mEq/L (ref 96–112)
GFR calc non Af Amer: 84 mL/min — ABNORMAL LOW (ref >90)
Glucose, Bld: 91 mg/dL (ref 70–99)
Potassium: 4.8 mEq/L (ref 3.7–5.3)
SODIUM: 139 meq/L (ref 137–147)

## 2014-05-13 LAB — CBC WITH DIFFERENTIAL/PLATELET
BASOS ABS: 0 10*3/uL (ref 0.0–0.1)
BASOS PCT: 0 % (ref 0–1)
Eosinophils Absolute: 0.1 10*3/uL (ref 0.0–0.7)
Eosinophils Relative: 1 % (ref 0–5)
HCT: 44.9 % (ref 36.0–46.0)
Hemoglobin: 14.6 g/dL (ref 12.0–15.0)
LYMPHS PCT: 38 % (ref 12–46)
Lymphs Abs: 2.6 10*3/uL (ref 0.7–4.0)
MCH: 31 pg (ref 26.0–34.0)
MCHC: 32.5 g/dL (ref 30.0–36.0)
MCV: 95.3 fL (ref 78.0–100.0)
Monocytes Absolute: 0.7 10*3/uL (ref 0.1–1.0)
Monocytes Relative: 10 % (ref 3–12)
NEUTROS ABS: 3.6 10*3/uL (ref 1.7–7.7)
NEUTROS PCT: 51 % (ref 43–77)
PLATELETS: 241 10*3/uL (ref 150–400)
RBC: 4.71 MIL/uL (ref 3.87–5.11)
RDW: 13.7 % (ref 11.5–15.5)
WBC: 7 10*3/uL (ref 4.0–10.5)

## 2014-05-13 LAB — PROTIME-INR
INR: 1.88 — AB (ref 0.00–1.49)
PROTHROMBIN TIME: 21.6 s — AB (ref 11.6–15.2)

## 2014-05-16 ENCOUNTER — Ambulatory Visit (HOSPITAL_BASED_OUTPATIENT_CLINIC_OR_DEPARTMENT_OTHER): Payer: Medicare Other | Admitting: Anesthesiology

## 2014-05-16 ENCOUNTER — Telehealth (INDEPENDENT_AMBULATORY_CARE_PROVIDER_SITE_OTHER): Payer: Self-pay

## 2014-05-16 ENCOUNTER — Encounter (HOSPITAL_BASED_OUTPATIENT_CLINIC_OR_DEPARTMENT_OTHER): Payer: Self-pay | Admitting: *Deleted

## 2014-05-16 ENCOUNTER — Ambulatory Visit (HOSPITAL_BASED_OUTPATIENT_CLINIC_OR_DEPARTMENT_OTHER)
Admission: RE | Admit: 2014-05-16 | Discharge: 2014-05-16 | Disposition: A | Discharge status: Home / Self Care | Payer: Medicare Other | Source: Ambulatory Visit | Attending: General Surgery | Admitting: General Surgery

## 2014-05-16 ENCOUNTER — Encounter (HOSPITAL_BASED_OUTPATIENT_CLINIC_OR_DEPARTMENT_OTHER): Payer: Medicare Other | Admitting: Anesthesiology

## 2014-05-16 ENCOUNTER — Encounter (HOSPITAL_BASED_OUTPATIENT_CLINIC_OR_DEPARTMENT_OTHER)
Admission: RE | Disposition: A | Discharge status: Home / Self Care | Payer: Self-pay | Source: Ambulatory Visit | Attending: General Surgery

## 2014-05-16 DIAGNOSIS — Z87891 Personal history of nicotine dependence: Secondary | ICD-10-CM | POA: Insufficient documentation

## 2014-05-16 DIAGNOSIS — R229 Localized swelling, mass and lump, unspecified: Secondary | ICD-10-CM | POA: Diagnosis not present

## 2014-05-16 DIAGNOSIS — Z7982 Long term (current) use of aspirin: Secondary | ICD-10-CM | POA: Insufficient documentation

## 2014-05-16 DIAGNOSIS — L723 Sebaceous cyst: Secondary | ICD-10-CM | POA: Insufficient documentation

## 2014-05-16 DIAGNOSIS — I10 Essential (primary) hypertension: Secondary | ICD-10-CM | POA: Insufficient documentation

## 2014-05-16 DIAGNOSIS — Z01812 Encounter for preprocedural laboratory examination: Secondary | ICD-10-CM | POA: Insufficient documentation

## 2014-05-16 DIAGNOSIS — Z01810 Encounter for preprocedural cardiovascular examination: Secondary | ICD-10-CM | POA: Diagnosis not present

## 2014-05-16 DIAGNOSIS — Z8673 Personal history of transient ischemic attack (TIA), and cerebral infarction without residual deficits: Secondary | ICD-10-CM | POA: Diagnosis not present

## 2014-05-16 DIAGNOSIS — E119 Type 2 diabetes mellitus without complications: Secondary | ICD-10-CM | POA: Insufficient documentation

## 2014-05-16 DIAGNOSIS — E785 Hyperlipidemia, unspecified: Secondary | ICD-10-CM | POA: Insufficient documentation

## 2014-05-16 DIAGNOSIS — Z7901 Long term (current) use of anticoagulants: Secondary | ICD-10-CM | POA: Insufficient documentation

## 2014-05-16 DIAGNOSIS — M216X9 Other acquired deformities of unspecified foot: Secondary | ICD-10-CM | POA: Insufficient documentation

## 2014-05-16 HISTORY — PX: AXILLARY LYMPH NODE BIOPSY: SHX5737

## 2014-05-16 HISTORY — DX: Cardiac murmur, unspecified: R01.1

## 2014-05-16 SURGERY — AXILLARY LYMPH NODE BIOPSY
Anesthesia: Monitor Anesthesia Care | Site: Axilla | Laterality: Left

## 2014-05-16 MED ORDER — FENTANYL CITRATE 0.05 MG/ML IJ SOLN
INTRAMUSCULAR | Status: DC | PRN
  Administered 2014-05-16 (×3): 25 ug via INTRAVENOUS

## 2014-05-16 MED ORDER — BUPIVACAINE HCL (PF) 0.25 % IJ SOLN
INTRAMUSCULAR | Status: AC
  Filled 2014-05-16: qty 30

## 2014-05-16 MED ORDER — PROPOFOL 10 MG/ML IV EMUL
INTRAVENOUS | Status: DC | PRN
  Administered 2014-05-16: 120 ug/kg/min via INTRAVENOUS

## 2014-05-16 MED ORDER — LIDOCAINE HCL (PF) 1 % IJ SOLN
INTRAMUSCULAR | Status: AC
  Filled 2014-05-16: qty 30

## 2014-05-16 MED ORDER — FENTANYL CITRATE 0.05 MG/ML IJ SOLN: 50.0000 ug | INTRAMUSCULAR | Status: DC | PRN

## 2014-05-16 MED ORDER — DEXAMETHASONE SODIUM PHOSPHATE 4 MG/ML IJ SOLN
INTRAMUSCULAR | Status: DC | PRN
  Administered 2014-05-16: 10 mg via INTRAVENOUS

## 2014-05-16 MED ORDER — CEFAZOLIN SODIUM-DEXTROSE 2-3 GM-% IV SOLR
2.0000 g | INTRAVENOUS | Status: AC
  Administered 2014-05-16: 2 g via INTRAVENOUS

## 2014-05-16 MED ORDER — FENTANYL CITRATE 0.05 MG/ML IJ SOLN: 25.0000 ug | INTRAMUSCULAR | Status: DC | PRN

## 2014-05-16 MED ORDER — LIDOCAINE-EPINEPHRINE (PF) 1 %-1:200000 IJ SOLN
INTRAMUSCULAR | Status: AC
  Filled 2014-05-16: qty 10

## 2014-05-16 MED ORDER — FENTANYL CITRATE 0.05 MG/ML IJ SOLN
INTRAMUSCULAR | Status: AC
  Filled 2014-05-16: qty 4

## 2014-05-16 MED ORDER — LACTATED RINGERS IV SOLN
INTRAVENOUS | Status: DC
  Administered 2014-05-16: 07:00:00 via INTRAVENOUS

## 2014-05-16 MED ORDER — ONDANSETRON HCL 4 MG/2ML IJ SOLN
INTRAMUSCULAR | Status: DC | PRN
  Administered 2014-05-16: 4 mg via INTRAVENOUS

## 2014-05-16 MED ORDER — HYDROCODONE-ACETAMINOPHEN 5-325 MG PO TABS: 1.0000 | ORAL_TABLET | Freq: Four times a day (QID) | ORAL | Status: DC | PRN

## 2014-05-16 MED ORDER — LIDOCAINE HCL (CARDIAC) 20 MG/ML IV SOLN
INTRAVENOUS | Status: DC | PRN
  Administered 2014-05-16: 50 mg via INTRAVENOUS

## 2014-05-16 MED ORDER — MIDAZOLAM HCL 5 MG/5ML IJ SOLN
INTRAMUSCULAR | Status: DC | PRN
  Administered 2014-05-16: 2 mg via INTRAVENOUS

## 2014-05-16 MED ORDER — LIDOCAINE-EPINEPHRINE (PF) 1 %-1:200000 IJ SOLN
INTRAMUSCULAR | Status: DC | PRN
  Administered 2014-05-16: 08:00:00

## 2014-05-16 MED ORDER — CEFAZOLIN SODIUM-DEXTROSE 2-3 GM-% IV SOLR
INTRAVENOUS | Status: AC
Start: 2014-05-16 — End: 2014-05-16
  Filled 2014-05-16: qty 50

## 2014-05-16 MED ORDER — OXYCODONE HCL 5 MG PO TABS: 5.0000 mg | ORAL_TABLET | Freq: Once | ORAL | Status: DC | PRN

## 2014-05-16 MED ORDER — MIDAZOLAM HCL 2 MG/2ML IJ SOLN: 1.0000 mg | INTRAMUSCULAR | Status: DC | PRN

## 2014-05-16 MED ORDER — OXYCODONE HCL 5 MG/5ML PO SOLN: 5.0000 mg | Freq: Once | ORAL | Status: DC | PRN

## 2014-05-16 MED ORDER — PROMETHAZINE HCL 25 MG/ML IJ SOLN: 6.2500 mg | INTRAMUSCULAR | Status: DC | PRN

## 2014-05-16 MED ORDER — MIDAZOLAM HCL 2 MG/2ML IJ SOLN
INTRAMUSCULAR | Status: AC
  Filled 2014-05-16: qty 2

## 2014-05-16 SURGICAL SUPPLY — 54 items
ADH SKN CLS APL DERMABOND .7 (GAUZE/BANDAGES/DRESSINGS)
APL SKNCLS STERI-STRIP NONHPOA (GAUZE/BANDAGES/DRESSINGS) ×1
APPLIER CLIP 9.375 MED OPEN (MISCELLANEOUS)
APR CLP MED 9.3 20 MLT OPN (MISCELLANEOUS)
BENZOIN TINCTURE PRP APPL 2/3 (GAUZE/BANDAGES/DRESSINGS) ×2 IMPLANT
BLADE SURG 15 STRL LF DISP TIS (BLADE) ×1 IMPLANT
BLADE SURG 15 STRL SS (BLADE) ×2
BNDG COHESIVE 4X5 TAN STRL (GAUZE/BANDAGES/DRESSINGS) IMPLANT
CANISTER SUCT 1200ML W/VALVE (MISCELLANEOUS) ×1 IMPLANT
CHLORAPREP W/TINT 26ML (MISCELLANEOUS) ×2 IMPLANT
CLIP APPLIE 9.375 MED OPEN (MISCELLANEOUS) IMPLANT
COVER MAYO STAND STRL (DRAPES) ×2 IMPLANT
COVER PROBE W GEL 5X96 (DRAPES) ×1 IMPLANT
COVER TABLE BACK 60X90 (DRAPES) ×2 IMPLANT
DECANTER SPIKE VIAL GLASS SM (MISCELLANEOUS) IMPLANT
DERMABOND ADVANCED (GAUZE/BANDAGES/DRESSINGS)
DERMABOND ADVANCED .7 DNX12 (GAUZE/BANDAGES/DRESSINGS) IMPLANT
DRAPE LAPAROSCOPIC ABDOMINAL (DRAPES) IMPLANT
DRAPE U-SHAPE 76X120 STRL (DRAPES) IMPLANT
DRSG TEGADERM 4X4.75 (GAUZE/BANDAGES/DRESSINGS) ×2 IMPLANT
ELECT COATED BLADE 2.86 ST (ELECTRODE) ×2 IMPLANT
ELECT REM PT RETURN 9FT ADLT (ELECTROSURGICAL) ×2
ELECTRODE REM PT RTRN 9FT ADLT (ELECTROSURGICAL) ×1 IMPLANT
GLOVE BIO SURGEON STRL SZ7 (GLOVE) ×2 IMPLANT
GLOVE BIOGEL PI IND STRL 6.5 (GLOVE) IMPLANT
GLOVE BIOGEL PI IND STRL 7.5 (GLOVE) ×1 IMPLANT
GLOVE BIOGEL PI INDICATOR 6.5 (GLOVE) ×1
GLOVE BIOGEL PI INDICATOR 7.5 (GLOVE) ×1
GLOVE ECLIPSE 6.5 STRL STRAW (GLOVE) ×1 IMPLANT
GOWN STRL REUS W/ TWL LRG LVL3 (GOWN DISPOSABLE) ×3 IMPLANT
GOWN STRL REUS W/TWL LRG LVL3 (GOWN DISPOSABLE) ×4
NDL HYPO 25X1 1.5 SAFETY (NEEDLE) ×1 IMPLANT
NEEDLE HYPO 25X1 1.5 SAFETY (NEEDLE) ×2 IMPLANT
NS IRRIG 1000ML POUR BTL (IV SOLUTION) IMPLANT
PACK BASIN DAY SURGERY FS (CUSTOM PROCEDURE TRAY) ×2 IMPLANT
PENCIL BUTTON HOLSTER BLD 10FT (ELECTRODE) ×2 IMPLANT
SLEEVE SCD COMPRESS KNEE MED (MISCELLANEOUS) ×1 IMPLANT
SPONGE GAUZE 4X4 12PLY STER LF (GAUZE/BANDAGES/DRESSINGS) ×1 IMPLANT
SPONGE LAP 4X18 X RAY DECT (DISPOSABLE) ×2 IMPLANT
STAPLER VISISTAT 35W (STAPLE) ×1 IMPLANT
STOCKINETTE IMPERVIOUS LG (DRAPES) IMPLANT
STRIP CLOSURE SKIN 1/2X4 (GAUZE/BANDAGES/DRESSINGS) ×2 IMPLANT
SUT MNCRL AB 4-0 PS2 18 (SUTURE) ×3 IMPLANT
SUT SILK 2 0 SH (SUTURE) IMPLANT
SUT VIC AB 2-0 SH 27 (SUTURE) ×2
SUT VIC AB 2-0 SH 27XBRD (SUTURE) ×1 IMPLANT
SUT VIC AB 3-0 SH 27 (SUTURE) ×2
SUT VIC AB 3-0 SH 27X BRD (SUTURE) IMPLANT
SUT VICRYL AB 3 0 TIES (SUTURE) ×1 IMPLANT
SYR CONTROL 10ML LL (SYRINGE) ×3 IMPLANT
TOWEL OR 17X24 6PK STRL BLUE (TOWEL DISPOSABLE) ×2 IMPLANT
TOWEL OR NON WOVEN STRL DISP B (DISPOSABLE) ×1 IMPLANT
TUBE CONNECTING 20X1/4 (TUBING) IMPLANT
YANKAUER SUCT BULB TIP NO VENT (SUCTIONS) IMPLANT

## 2014-05-16 NOTE — H&P (Signed)
This is a 68 year old female is on Coumadin for a clotting disorder. She has a history of couple of times of a left axillary abscess that has been drained. This recurs every time. The last time she still had a marble-sized area that was present. She presents after undergoing incision and drainage of a left axillary abscess. She's doing well after I did that. This has happened 3 times now. She returns today to discuss definitive therapy.   Past Medical History   Diagnosis  Date   .  Clotting disorder    .  Diabetes mellitus without complication    .  Hyperlipidemia    .  Hypertension    .  Stroke     Past Surgical History   Procedure  Laterality  Date   .  Abdominal hysterectomy      Family History   Problem  Relation  Age of Onset   .  Heart disease  Mother    .  COPD  Father    Social History  History   Substance Use Topics   .  Smoking status:  Former Smoker     Types:  Cigarettes     Quit date:  03/14/2009   .  Smokeless tobacco:  Not on file   .  Alcohol Use:  No    Allergies   Allergen  Reactions   .  Codeine     Current Outpatient Prescriptions   Medication  Sig  Dispense  Refill   .  amLODipine (NORVASC) 5 MG tablet  Take 5 mg by mouth daily.     Marland Kitchen  aspirin 81 MG tablet  Take 81 mg by mouth daily.     Marland Kitchen  ezetimibe (ZETIA) 10 MG tablet  Take 10 mg by mouth daily.     Marland Kitchen  lisinopril (PRINIVIL,ZESTRIL) 10 MG tablet  Take 10 mg by mouth daily.     .  simvastatin (ZOCOR) 20 MG tablet  Take 20 mg by mouth daily.      No current facility-administered medications for this visit.   Review of Systems  Review of Systems  Constitutional: Negative for fever, chills and unexpected weight change.  HENT: Negative for congestion, hearing loss, sore throat, trouble swallowing and voice change.  Eyes: Negative for visual disturbance.  Respiratory: Negative for cough and wheezing.  Cardiovascular: Negative for chest pain, palpitations and leg swelling.  Gastrointestinal: Negative  for nausea, vomiting, abdominal pain, diarrhea, constipation, blood in stool, abdominal distention and anal bleeding.  Genitourinary: Negative for hematuria, vaginal bleeding and difficulty urinating.  Musculoskeletal: Negative for arthralgias.  Skin: Negative for rash and wound.  Neurological: Negative for seizures, syncope and headaches.  Hematological: Negative for adenopathy. Does not bruise/bleed easily.  Psychiatric/Behavioral: Negative for confusion.  Blood pressure 130/76, pulse 80, temperature 97.5 F (36.4 C), height 5\' 3"  (1.6 m), weight 182 lb (82.555 kg).  Physical Exam  Physical Exam  Vitals reviewed.  Constitutional: She appears well-developed and well-nourished.  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 I think to prevent this from coming back again that she will need to have this area removed. We discussed excision under local anesthesia with monitored anesthesia care. I discussed the risks including bleeding, infection, recurrence. Were going to plan on doing this in August all discussed with Dr. Joylene Draft management of her anti-coagulation.

## 2014-05-16 NOTE — Transfer of Care (Signed)
Immediate Anesthesia Transfer of Care Note  Patient: [name]  Procedure(s) Performed: Procedure(s) with comments: EXCISION 2CM LEFT AXILLARY MASS (Left) - Left axillary sebaceous cyst excision  Patient Location: PACU  Anesthesia Type:MAC  Level of Consciousness: awake, alert , oriented and patient cooperative  Airway & Oxygen Therapy: Patient Spontanous Breathing and Patient connected to nasal cannula oxygen  Post-op Assessment: Report given to PACU RN and Post -op Vital signs reviewed and stable  Post vital signs: Reviewed and stable  Complications: No apparent anesthesia complications

## 2014-05-16 NOTE — Transfer of Care (Deleted)
Immediate Anesthesia Transfer of Care Note  Patient: [name]  Procedure(s) Performed: Procedure(s) with comments: EXCISION 2CM LEFT AXILLARY MASS (Left) - Left axillary sebaceous cyst excision  Patient Location: PACU  Anesthesia Type:MAC  Level of Consciousness: awake, alert , oriented and patient cooperative  Airway & Oxygen Therapy: Patient Spontanous Breathing and Patient connected to nasal cannula oxygen  Post-op Assessment: Report given to PACU RN and Post -op Vital signs reviewed and stable  Post vital signs: Reviewed and stable  Complications: No apparent anesthesia complications

## 2014-05-16 NOTE — Anesthesia Postprocedure Evaluation (Signed)
  Anesthesia Post-op Note  Patient: [name]  Procedure(s) Performed: Procedure(s) with comments: EXCISION 2CM LEFT AXILLARY MASS (Left) - Left axillary sebaceous cyst excision  Patient Location: PACU  Anesthesia Type:MAC  Level of Consciousness: awake and alert   Airway and Oxygen Therapy: Patient Spontanous Breathing  Post-op Pain: none  Post-op Assessment: Post-op Vital signs reviewed  Post-op Vital Signs: stable  Last Vitals:  Filed Vitals:   05/16/14 0845  BP: 119/95  Pulse: 75  Temp:   Resp: 18    Complications: No apparent anesthesia complications

## 2014-05-16 NOTE — Telephone Encounter (Signed)
Called and spoke to [facility] to notify them that the pt did have her surgery today. Dr Donne Hazel is sending pt home on Lovenox and Coumadin today. Estill Bamberg is aware.

## 2014-05-16 NOTE — Discharge Instructions (Signed)
[facility] Phone Number [phone]  POST OP INSTRUCTIONS  Always review your discharge instruction sheet given to you by the facility where your surgery was performed.  IF YOU HAVE DISABILITY OR FAMILY LEAVE FORMS, YOU MUST BRING THEM TO THE OFFICE FOR PROCESSING.  DO NOT GIVE THEM TO YOUR DOCTOR.  1. A prescription for pain medication may be given to you upon discharge.  Take your pain medication as prescribed, if needed.  If narcotic pain medicine is not needed, then you may take acetaminophen (Tylenol), naprosyn (Alleve) or ibuprofen (Advil) as needed. 2. Take your usually prescribed medications unless otherwise directed 3. If you need a refill on your pain medication, please contact your pharmacy.  They will contact our office to request authorization.  Prescriptions will not be filled after 5pm or on week-ends. 4. You should eat very light the first 24 hours after surgery, such as soup, crackers, pudding, etc.  Resume your normal diet the day after surgery. 5. Most patients will experience some swelling and bruising.  Ice packs will help.  Swelling and bruising can take several days to resolve.  6. It is common to experience some constipation if taking pain medication after surgery.  Increasing fluid intake and taking a stool softener will usually help or prevent this problem from occurring.  A mild laxative (Milk of Magnesia or Miralax) should be taken according to package directions if there are no bowel movements after 48 hours. 7. Unless discharge instructions indicate otherwise, you may remove your bandages 48 hours after surgery and you may shower at that time.  You may have steri-strips (small skin tapes) in place directly over the incision.  These strips should be left on the skin for 7-10 days and will come off on their own.   8. ACTIVITIES:  You may resume regular daily activities (gradually increasing) beginning the next day.   a. You may drive when you no  longer are taking prescription pain medication, you can comfortably wear a seatbelt, and you can safely maneuver your car and apply brakes. b. RETURN TO WORK:  ______________________________________________________________________________________ 9. You should see your doctor in the office for a follow-up appointment approximately 2-3 weeks after your surgery.  Your doctors nurse will typically make your follow-up appointment when she calls you with your pathology report.  Expect your pathology report 3-4 business days after your surgery.  You may call to check if you do not hear from [place] after three days. 10. OTHER INSTRUCTIONS: _______________________________________________________________________________________________ _____________________________________________________________________________________________________________________________________ _____________________________________________________________________________________________________________________________________ _____________________________________________________________________________________________________________________________________  WHEN TO CALL DR WAKEFIELD: 1. Fever over 101.0 2. Nausea and/or vomiting. 3. Extreme swelling or bruising. 4. Continued bleeding from incision. 5. Increased pain, redness, or drainage from the incision.  The clinic staff is available to answer your questions during regular business hours.  Please dont hesitate to call and ask to speak to one of the nurses for clinical concerns.  If you have a medical emergency, go to the nearest emergency room or call 911.  A surgeon from [facility] is always on call at the hospital.  For further questions, please visit [email or web address]   Post Anesthesia Home Care Instructions  Activity: Get plenty of rest for the remainder of the day. A responsible adult should stay with you for 24 hours following the procedure.    For the next 24 hours, DO NOT: -Drive a car -Paediatric nurse -Drink alcoholic beverages -Take any medication unless instructed by your physician -Make any legal decisions or sign important  papers.  Meals: Start with liquid foods such as gelatin or soup. Progress to regular foods as tolerated. Avoid greasy, spicy, heavy foods. If nausea and/or vomiting occur, drink only clear liquids until the nausea and/or vomiting subsides. Call your physician if vomiting continues.  Special Instructions/Symptoms: Your throat may feel dry or sore from the anesthesia or the breathing tube placed in your throat during surgery. If this causes discomfort, gargle with warm salt water. The discomfort should disappear within 24 hours.

## 2014-05-16 NOTE — Telephone Encounter (Signed)
Message copied by Illene Regulus on Mon May 16, 2014 11:16 AM ------      Message from: Donne Hazel, MATTHEW      Created: Mon May 16, 2014  8:17 AM       Lars Mage,      Could you make sure Dr perinis office knows I operated on her today. She is back on lovenox and her coumadin as of today and will need follow up      Thanks,      MW ------

## 2014-05-16 NOTE — Anesthesia Preprocedure Evaluation (Addendum)
Anesthesia Evaluation  Patient identified by MRN, date of birth, ID band Patient awake    Reviewed: Allergy & Precautions, H&P , Patient's Chart, lab work & pertinent test results  Airway Mallampati: I TM Distance: >3 FB Neck ROM: Full    Dental  (+) Teeth Intact, Dental Advisory Given, Missing   Pulmonary former smoker,  breath sounds clear to auscultation        Cardiovascular hypertension, Pt. on medications + Valvular Problems/Murmurs AI Rhythm:Regular Rate:Normal     Neuro/Psych Right foot drop, uses cane to ambulate. CVA, Residual Symptoms    GI/Hepatic   Endo/Other  diabetes, Well Controlled, Type 2  Renal/GU      Musculoskeletal   Abdominal   Peds  Hematology  (+) Blood dyscrasia, , Factor V    Anesthesia Other Findings Several teeth missing upper and lower.  Reproductive/Obstetrics                      Anesthesia Physical Anesthesia Plan  ASA: III  Anesthesia Plan: MAC   Post-op Pain Management:    Induction: Intravenous  Airway Management Planned: Nasal Cannula  Additional Equipment:   Intra-op Plan:   Post-operative Plan:   Informed Consent: I have reviewed the patients History and Physical, chart, labs and discussed the procedure including the risks, benefits and alternatives for the proposed anesthesia with the patient or authorized representative who has indicated his/her understanding and acceptance.   Dental advisory given  Plan Discussed with:   Anesthesia Plan Comments:        Anesthesia Quick Evaluation

## 2014-05-16 NOTE — Op Note (Signed)
Preoperative diagnosis: Left axillary sebaceous cyst with recurrent infections Postoperative diagnosis: Same as above Procedure: Left axillary mass excision, 4 x 4 centimeters Surgeon: Dr. Serita Grammes Anesthesia: Local with monitored anesthesia care Specimens: Left axillary mass Estimated blood loss: Minimal Consultations: None Drains: None Sponge and needle count correct at completion Disposition to recovery stable  Indications: This is a 68 year old female multiple medical problems who is on Coumadin and has a history of a stroke who presents with a recurrent left axillary mass. This area has been drained multiple times and I drained it recently. We discussed all the options and we decided to proceed with excising this due to the recurrent infections.  Procedure: After informed consent was obtained she was taken to the operating room. She was given cefazolin. Sequential compression devices were on her legs. She was placed under monitored anesthesia care. Her left axilla was prepped and draped in the standard sterile surgical fashion. A surgical timeout was then performed.  I infiltrated a mixture of 1% lidocaine with epinephrine and quarter percent Marcaine and infiltrated the surrounding area. My prior incision as well as the connection of the skin were easily visible. There was an underlying mass. I made an elliptical incision around my old incision as well as this connection. I then used cautery to excise this area in its entirety. Hemostasis was then obtained. I then closed this with 2-0 Vicryl, 3-0 Vicryl, 4-0 Monocryl. Benzoin and Steri-Strips are placed. A sterile dressing was placed. She tolerated this well was and was transferred to recovery in stable condition.

## 2014-05-17 ENCOUNTER — Encounter (HOSPITAL_BASED_OUTPATIENT_CLINIC_OR_DEPARTMENT_OTHER): Payer: Self-pay | Admitting: General Surgery

## 2014-05-20 DIAGNOSIS — Z7901 Long term (current) use of anticoagulants: Secondary | ICD-10-CM | POA: Diagnosis not present

## 2014-05-23 DIAGNOSIS — Z7901 Long term (current) use of anticoagulants: Secondary | ICD-10-CM | POA: Diagnosis not present

## 2014-05-23 DIAGNOSIS — L0293 Carbuncle, unspecified: Secondary | ICD-10-CM | POA: Diagnosis not present

## 2014-05-23 DIAGNOSIS — L0292 Furuncle, unspecified: Secondary | ICD-10-CM | POA: Diagnosis not present

## 2014-05-23 DIAGNOSIS — D689 Coagulation defect, unspecified: Secondary | ICD-10-CM | POA: Diagnosis not present

## 2014-05-23 DIAGNOSIS — I69959 Hemiplegia and hemiparesis following unspecified cerebrovascular disease affecting unspecified side: Secondary | ICD-10-CM | POA: Diagnosis not present

## 2014-05-25 ENCOUNTER — Telehealth (INDEPENDENT_AMBULATORY_CARE_PROVIDER_SITE_OTHER): Payer: Self-pay

## 2014-05-25 NOTE — Telephone Encounter (Signed)
Pt notified of message below on the pathology.

## 2014-05-25 NOTE — Telephone Encounter (Signed)
LMOM for pt to call. I want to give her pathology results on left axillary cyst that is benign no malignancy per Dr Donne Hazel.

## 2014-06-09 ENCOUNTER — Ambulatory Visit (INDEPENDENT_AMBULATORY_CARE_PROVIDER_SITE_OTHER): Payer: Medicare Other | Admitting: General Surgery

## 2014-06-09 ENCOUNTER — Encounter (INDEPENDENT_AMBULATORY_CARE_PROVIDER_SITE_OTHER): Payer: Self-pay | Admitting: General Surgery

## 2014-06-09 VITALS — BP 122/80 | HR 76 | Temp 97.5°F | Ht 63.0 in | Wt 177.0 lb

## 2014-06-09 DIAGNOSIS — Z09 Encounter for follow-up examination after completed treatment for conditions other than malignant neoplasm: Secondary | ICD-10-CM

## 2014-06-09 NOTE — Progress Notes (Signed)
Subjective:     Patient ID: [name], female   DOB: 1946-09-05, 68 y.o.   MRN: [id number]  HPI This is a 68 year old female who had a left axillary sebaceous cyst that had multiple infections. I excised this. She has done well. She returns today. This area is healed. She is back on all her medications including her Coumadin. Her pathology shows a benign epidermal inclusion cyst. She has some numbness around the incision that will get better with some time. Review of Systems     Objective:   Physical Exam Healing left axillary incision without infection    Assessment:     Status post left axillary sebaceous cyst excision     Plan:     She can return to full activity. I will see her back as needed. The numbness will improve over some time.

## 2014-06-14 DIAGNOSIS — Z7901 Long term (current) use of anticoagulants: Secondary | ICD-10-CM | POA: Diagnosis not present

## 2014-06-14 DIAGNOSIS — I69959 Hemiplegia and hemiparesis following unspecified cerebrovascular disease affecting unspecified side: Secondary | ICD-10-CM | POA: Diagnosis not present

## 2014-06-14 DIAGNOSIS — D689 Coagulation defect, unspecified: Secondary | ICD-10-CM | POA: Diagnosis not present

## 2014-06-15 DIAGNOSIS — R82998 Other abnormal findings in urine: Secondary | ICD-10-CM | POA: Diagnosis not present

## 2014-06-15 DIAGNOSIS — R350 Frequency of micturition: Secondary | ICD-10-CM | POA: Diagnosis not present

## 2014-06-21 DIAGNOSIS — D689 Coagulation defect, unspecified: Secondary | ICD-10-CM | POA: Diagnosis not present

## 2014-06-21 DIAGNOSIS — Z7901 Long term (current) use of anticoagulants: Secondary | ICD-10-CM | POA: Diagnosis not present

## 2014-07-14 DIAGNOSIS — Z7901 Long term (current) use of anticoagulants: Secondary | ICD-10-CM | POA: Diagnosis not present

## 2014-07-14 DIAGNOSIS — I69959 Hemiplegia and hemiparesis following unspecified cerebrovascular disease affecting unspecified side: Secondary | ICD-10-CM | POA: Diagnosis not present

## 2014-07-14 DIAGNOSIS — D689 Coagulation defect, unspecified: Secondary | ICD-10-CM | POA: Diagnosis not present

## 2014-07-14 DIAGNOSIS — Z23 Encounter for immunization: Secondary | ICD-10-CM | POA: Diagnosis not present

## 2014-08-15 DIAGNOSIS — Z7901 Long term (current) use of anticoagulants: Secondary | ICD-10-CM | POA: Diagnosis not present

## 2014-08-15 DIAGNOSIS — D689 Coagulation defect, unspecified: Secondary | ICD-10-CM | POA: Diagnosis not present

## 2014-08-31 DIAGNOSIS — S90851A Superficial foreign body, right foot, initial encounter: Secondary | ICD-10-CM | POA: Diagnosis not present

## 2014-09-05 DIAGNOSIS — Z1881 Retained glass fragments: Secondary | ICD-10-CM | POA: Diagnosis not present

## 2014-09-05 DIAGNOSIS — L02611 Cutaneous abscess of right foot: Secondary | ICD-10-CM | POA: Diagnosis not present

## 2014-09-15 DIAGNOSIS — Z7901 Long term (current) use of anticoagulants: Secondary | ICD-10-CM | POA: Diagnosis not present

## 2014-09-15 DIAGNOSIS — D689 Coagulation defect, unspecified: Secondary | ICD-10-CM | POA: Diagnosis not present

## 2014-09-15 DIAGNOSIS — L02611 Cutaneous abscess of right foot: Secondary | ICD-10-CM | POA: Diagnosis not present

## 2014-09-15 DIAGNOSIS — I69959 Hemiplegia and hemiparesis following unspecified cerebrovascular disease affecting unspecified side: Secondary | ICD-10-CM | POA: Diagnosis not present

## 2014-09-15 DIAGNOSIS — L97909 Non-pressure chronic ulcer of unspecified part of unspecified lower leg with unspecified severity: Secondary | ICD-10-CM | POA: Diagnosis not present

## 2014-09-15 DIAGNOSIS — E1340 Other specified diabetes mellitus with diabetic neuropathy, unspecified: Secondary | ICD-10-CM | POA: Diagnosis not present

## 2014-09-15 DIAGNOSIS — Z1881 Retained glass fragments: Secondary | ICD-10-CM | POA: Diagnosis not present

## 2014-09-21 DIAGNOSIS — R8299 Other abnormal findings in urine: Secondary | ICD-10-CM | POA: Diagnosis not present

## 2014-09-21 DIAGNOSIS — R350 Frequency of micturition: Secondary | ICD-10-CM | POA: Diagnosis not present

## 2014-09-29 DIAGNOSIS — Z7901 Long term (current) use of anticoagulants: Secondary | ICD-10-CM | POA: Diagnosis not present

## 2014-10-14 HISTORY — PX: SKIN CANCER EXCISION: SHX779

## 2014-10-21 DIAGNOSIS — R8299 Other abnormal findings in urine: Secondary | ICD-10-CM | POA: Diagnosis not present

## 2014-10-21 DIAGNOSIS — E785 Hyperlipidemia, unspecified: Secondary | ICD-10-CM | POA: Diagnosis not present

## 2014-10-21 DIAGNOSIS — Z008 Encounter for other general examination: Secondary | ICD-10-CM | POA: Diagnosis not present

## 2014-10-21 DIAGNOSIS — E119 Type 2 diabetes mellitus without complications: Secondary | ICD-10-CM | POA: Diagnosis not present

## 2014-10-21 DIAGNOSIS — Z7901 Long term (current) use of anticoagulants: Secondary | ICD-10-CM | POA: Diagnosis not present

## 2014-10-21 DIAGNOSIS — I10 Essential (primary) hypertension: Secondary | ICD-10-CM | POA: Diagnosis not present

## 2014-10-26 ENCOUNTER — Encounter: Payer: Self-pay | Admitting: Internal Medicine

## 2014-10-26 DIAGNOSIS — Z0000 Encounter for general adult medical examination without abnormal findings: Secondary | ICD-10-CM | POA: Diagnosis not present

## 2014-10-26 DIAGNOSIS — F329 Major depressive disorder, single episode, unspecified: Secondary | ICD-10-CM | POA: Diagnosis not present

## 2014-10-26 DIAGNOSIS — Z23 Encounter for immunization: Secondary | ICD-10-CM | POA: Diagnosis not present

## 2014-10-26 DIAGNOSIS — R2689 Other abnormalities of gait and mobility: Secondary | ICD-10-CM | POA: Diagnosis not present

## 2014-10-26 DIAGNOSIS — E785 Hyperlipidemia, unspecified: Secondary | ICD-10-CM | POA: Diagnosis not present

## 2014-10-26 DIAGNOSIS — S62109D Fracture of unspecified carpal bone, unspecified wrist, subsequent encounter for fracture with routine healing: Secondary | ICD-10-CM | POA: Diagnosis not present

## 2014-10-26 DIAGNOSIS — Z7901 Long term (current) use of anticoagulants: Secondary | ICD-10-CM | POA: Diagnosis not present

## 2014-10-26 DIAGNOSIS — E119 Type 2 diabetes mellitus without complications: Secondary | ICD-10-CM | POA: Diagnosis not present

## 2014-10-26 DIAGNOSIS — M859 Disorder of bone density and structure, unspecified: Secondary | ICD-10-CM | POA: Diagnosis not present

## 2014-10-26 DIAGNOSIS — Z6831 Body mass index (BMI) 31.0-31.9, adult: Secondary | ICD-10-CM | POA: Diagnosis not present

## 2014-10-26 DIAGNOSIS — E669 Obesity, unspecified: Secondary | ICD-10-CM | POA: Diagnosis not present

## 2014-10-26 DIAGNOSIS — I10 Essential (primary) hypertension: Secondary | ICD-10-CM | POA: Diagnosis not present

## 2014-10-28 DIAGNOSIS — Z1212 Encounter for screening for malignant neoplasm of rectum: Secondary | ICD-10-CM | POA: Diagnosis not present

## 2014-11-02 DIAGNOSIS — Z1231 Encounter for screening mammogram for malignant neoplasm of breast: Secondary | ICD-10-CM | POA: Diagnosis not present

## 2014-11-24 DIAGNOSIS — E119 Type 2 diabetes mellitus without complications: Secondary | ICD-10-CM | POA: Diagnosis not present

## 2014-12-05 DIAGNOSIS — M6281 Muscle weakness (generalized): Secondary | ICD-10-CM | POA: Diagnosis not present

## 2014-12-05 DIAGNOSIS — R262 Difficulty in walking, not elsewhere classified: Secondary | ICD-10-CM | POA: Diagnosis not present

## 2014-12-05 DIAGNOSIS — R2689 Other abnormalities of gait and mobility: Secondary | ICD-10-CM | POA: Diagnosis not present

## 2014-12-07 DIAGNOSIS — M6281 Muscle weakness (generalized): Secondary | ICD-10-CM | POA: Diagnosis not present

## 2014-12-07 DIAGNOSIS — R2689 Other abnormalities of gait and mobility: Secondary | ICD-10-CM | POA: Diagnosis not present

## 2014-12-07 DIAGNOSIS — R262 Difficulty in walking, not elsewhere classified: Secondary | ICD-10-CM | POA: Diagnosis not present

## 2014-12-12 DIAGNOSIS — R2689 Other abnormalities of gait and mobility: Secondary | ICD-10-CM | POA: Diagnosis not present

## 2014-12-12 DIAGNOSIS — R262 Difficulty in walking, not elsewhere classified: Secondary | ICD-10-CM | POA: Diagnosis not present

## 2014-12-12 DIAGNOSIS — M6281 Muscle weakness (generalized): Secondary | ICD-10-CM | POA: Diagnosis not present

## 2014-12-13 ENCOUNTER — Ambulatory Visit (INDEPENDENT_AMBULATORY_CARE_PROVIDER_SITE_OTHER): Payer: Medicare Other | Admitting: Internal Medicine

## 2014-12-13 ENCOUNTER — Encounter: Payer: Self-pay | Admitting: Internal Medicine

## 2014-12-13 VITALS — BP 126/72 | HR 80 | Ht 63.0 in | Wt 177.6 lb

## 2014-12-13 DIAGNOSIS — Z1211 Encounter for screening for malignant neoplasm of colon: Secondary | ICD-10-CM

## 2014-12-13 DIAGNOSIS — Z7901 Long term (current) use of anticoagulants: Secondary | ICD-10-CM | POA: Diagnosis not present

## 2014-12-13 DIAGNOSIS — I6330 Cerebral infarction due to thrombosis of unspecified cerebral artery: Secondary | ICD-10-CM

## 2014-12-13 DIAGNOSIS — I639 Cerebral infarction, unspecified: Secondary | ICD-10-CM

## 2014-12-13 DIAGNOSIS — D6852 Prothrombin gene mutation: Secondary | ICD-10-CM

## 2014-12-13 DIAGNOSIS — D6859 Other primary thrombophilia: Secondary | ICD-10-CM

## 2014-12-13 NOTE — Progress Notes (Signed)
HISTORY OF PRESENT ILLNESS:  [name] is a 69 y.o. female who is referred to this office by Dr. Joylene Draft regarding a family history of colon cancer and colon cancer screening in the form of colonoscopy. Special consideration chronic anticoagulation and comorbidities. The patient has not had prior screening. Her sister, at age 38, recent diagnosed with colon cancer. Outside records reviewed. Recent stool Hemoccult testing was normal. Hemoglobin normal. Patient's GI review of systems is negative. 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 Patient has decreased mobility due to effects on her right side from a stroke. When she does come off anticoagulation, she must be bridged with Lovenox.  REVIEW OF SYSTEMS:  All non-GI ROS negative except for allergy, anxiety, depression, right-sided weakness  Past Medical History  Diagnosis Date  . Clotting disorder   . Hyperlipidemia   . Hypertension   . Diabetes mellitus without complication     no meds-diet controlled  . Stroke 2010  . Heart murmur     mild mitral regurg-echo 2010  . Allergic rhinitis   . Lupus anticoagulant positive   . Obesity     Past Surgical History  Procedure Laterality Date  . Abdominal hysterectomy  1981  . Appendectomy    . Dilation and curettage of uterus    . Axillary lymph node biopsy Left 05/16/2014    Procedure: EXCISION 2CM LEFT AXILLARY MASS;  Surgeon: [name], MD;  Location: [facility];  Service: General;  Laterality: Left;  Left axillary sebaceous cyst excision    Social History [name]  reports that she quit smoking about 5 years ago. Her smoking use included Cigarettes. She has never used smokeless tobacco. She reports that she does not drink alcohol or use illicit drugs.  family history includes COPD in her father; Clotting disorder in her sister; Colon cancer in  her sister; Diabetes in her sister; Heart disease in her brother and mother; Kidney disease in her sister.  Allergies  Allergen Reactions  . Codeine     hallucination       PHYSICAL EXAMINATION: Vital signs: BP 126/72 mmHg  Pulse 80  Ht 5\' 3"  (1.6 m)  Wt 177 lb 9.6 oz (80.559 kg)  BMI 31.47 kg/m2  Constitutional: generally well-appearing, no acute distress Psychiatric: alert and oriented x3, cooperative Eyes: extraocular movements intact, anicteric, conjunctiva pink Mouth: oral pharynx moist, no lesions Neck: supple no lymphadenopathy Cardiovascular: heart regular rate and rhythm, no murmur Lungs: clear to auscultation bilaterally Abdomen: soft, obese, nontender, nondistended, no obvious ascites, no peritoneal signs, normal bowel sounds, no organomegaly Rectal: Omitted Extremities: no lower extremity edema bilaterally Skin: no lesions on visible extremities Neuro: Right lower extremity weakness greater than right upper extremity weakness.    ASSESSMENT:   #1. Colon cancer screening. See below #2. Hypercoagulable state on chronic anticoagulation #3. Prior stroke with significant residual deficits #4. Multiple general medical problems. Stable #5. Family history of colon cancer in sister at age 42   PLAN:  #1. I discussed with the patient in great detail colon cancer screening strategies. She has had Hemoccult studies. We focused on optical colonoscopy and cologard testing. In addition to the nature of each procedure as well as the risks benefits, I discussed with her the sensitivity and specificity of each test. I also informed her that her risk of colon cancer is approximately that of the population a large,  as her sister was diagnosed in her 41s. We also discussed the pros and cons of needing to manipulate anticoagulation as well as difficulties related to her stroke. To that end, I recommended cologard. If negative, repeat cologard in 3 years based on [facility] studies  and recommendations. If positive, she would need optical colonoscopy with the associated risks (were she to agree) and anticoagulation management in the form of Lovenox bridge. 45 minutes were spent reviewing records and counseling the patient. Greater than half this time spent face-to-face with the patient  A copy has been sent to Dr. Crist Infante

## 2014-12-13 NOTE — Patient Instructions (Signed)
You will receive a phone call from [employer] [facility] to start the process of initiating a Cologuard test.

## 2014-12-14 ENCOUNTER — Telehealth: Payer: Self-pay

## 2014-12-14 DIAGNOSIS — R2689 Other abnormalities of gait and mobility: Secondary | ICD-10-CM | POA: Diagnosis not present

## 2014-12-14 DIAGNOSIS — R262 Difficulty in walking, not elsewhere classified: Secondary | ICD-10-CM | POA: Diagnosis not present

## 2014-12-14 DIAGNOSIS — M6281 Muscle weakness (generalized): Secondary | ICD-10-CM | POA: Diagnosis not present

## 2014-12-14 NOTE — Telephone Encounter (Signed)
Submitted Cologuard through portal.

## 2014-12-20 DIAGNOSIS — R262 Difficulty in walking, not elsewhere classified: Secondary | ICD-10-CM | POA: Diagnosis not present

## 2014-12-20 DIAGNOSIS — M6281 Muscle weakness (generalized): Secondary | ICD-10-CM | POA: Diagnosis not present

## 2014-12-20 DIAGNOSIS — R2689 Other abnormalities of gait and mobility: Secondary | ICD-10-CM | POA: Diagnosis not present

## 2014-12-22 DIAGNOSIS — R262 Difficulty in walking, not elsewhere classified: Secondary | ICD-10-CM | POA: Diagnosis not present

## 2014-12-22 DIAGNOSIS — R2689 Other abnormalities of gait and mobility: Secondary | ICD-10-CM | POA: Diagnosis not present

## 2014-12-22 DIAGNOSIS — M6281 Muscle weakness (generalized): Secondary | ICD-10-CM | POA: Diagnosis not present

## 2014-12-26 DIAGNOSIS — R262 Difficulty in walking, not elsewhere classified: Secondary | ICD-10-CM | POA: Diagnosis not present

## 2014-12-26 DIAGNOSIS — M6281 Muscle weakness (generalized): Secondary | ICD-10-CM | POA: Diagnosis not present

## 2014-12-26 DIAGNOSIS — R2689 Other abnormalities of gait and mobility: Secondary | ICD-10-CM | POA: Diagnosis not present

## 2014-12-28 DIAGNOSIS — M6281 Muscle weakness (generalized): Secondary | ICD-10-CM | POA: Diagnosis not present

## 2014-12-28 DIAGNOSIS — R2689 Other abnormalities of gait and mobility: Secondary | ICD-10-CM | POA: Diagnosis not present

## 2014-12-28 DIAGNOSIS — R262 Difficulty in walking, not elsewhere classified: Secondary | ICD-10-CM | POA: Diagnosis not present

## 2014-12-29 DIAGNOSIS — Z1211 Encounter for screening for malignant neoplasm of colon: Secondary | ICD-10-CM | POA: Diagnosis not present

## 2014-12-29 DIAGNOSIS — Z1212 Encounter for screening for malignant neoplasm of rectum: Secondary | ICD-10-CM | POA: Diagnosis not present

## 2014-12-29 LAB — COLOGUARD

## 2015-01-02 DIAGNOSIS — R2689 Other abnormalities of gait and mobility: Secondary | ICD-10-CM | POA: Diagnosis not present

## 2015-01-02 DIAGNOSIS — R262 Difficulty in walking, not elsewhere classified: Secondary | ICD-10-CM | POA: Diagnosis not present

## 2015-01-02 DIAGNOSIS — M6281 Muscle weakness (generalized): Secondary | ICD-10-CM | POA: Diagnosis not present

## 2015-01-04 DIAGNOSIS — R262 Difficulty in walking, not elsewhere classified: Secondary | ICD-10-CM | POA: Diagnosis not present

## 2015-01-04 DIAGNOSIS — R2689 Other abnormalities of gait and mobility: Secondary | ICD-10-CM | POA: Diagnosis not present

## 2015-01-04 DIAGNOSIS — M6281 Muscle weakness (generalized): Secondary | ICD-10-CM | POA: Diagnosis not present

## 2015-01-05 LAB — COLOGUARD: Cologuard: POSITIVE

## 2015-01-10 DIAGNOSIS — R262 Difficulty in walking, not elsewhere classified: Secondary | ICD-10-CM | POA: Diagnosis not present

## 2015-01-10 DIAGNOSIS — R2689 Other abnormalities of gait and mobility: Secondary | ICD-10-CM | POA: Diagnosis not present

## 2015-01-10 DIAGNOSIS — M6281 Muscle weakness (generalized): Secondary | ICD-10-CM | POA: Diagnosis not present

## 2015-01-12 DIAGNOSIS — R262 Difficulty in walking, not elsewhere classified: Secondary | ICD-10-CM | POA: Diagnosis not present

## 2015-01-12 DIAGNOSIS — M6281 Muscle weakness (generalized): Secondary | ICD-10-CM | POA: Diagnosis not present

## 2015-01-17 DIAGNOSIS — M6281 Muscle weakness (generalized): Secondary | ICD-10-CM | POA: Diagnosis not present

## 2015-01-17 DIAGNOSIS — R2689 Other abnormalities of gait and mobility: Secondary | ICD-10-CM | POA: Diagnosis not present

## 2015-01-17 DIAGNOSIS — R262 Difficulty in walking, not elsewhere classified: Secondary | ICD-10-CM | POA: Diagnosis not present

## 2015-01-18 DIAGNOSIS — Z7901 Long term (current) use of anticoagulants: Secondary | ICD-10-CM | POA: Diagnosis not present

## 2015-01-19 DIAGNOSIS — R262 Difficulty in walking, not elsewhere classified: Secondary | ICD-10-CM | POA: Diagnosis not present

## 2015-01-19 DIAGNOSIS — R2689 Other abnormalities of gait and mobility: Secondary | ICD-10-CM | POA: Diagnosis not present

## 2015-01-19 DIAGNOSIS — M6281 Muscle weakness (generalized): Secondary | ICD-10-CM | POA: Diagnosis not present

## 2015-01-25 DIAGNOSIS — M6281 Muscle weakness (generalized): Secondary | ICD-10-CM | POA: Diagnosis not present

## 2015-01-25 DIAGNOSIS — R262 Difficulty in walking, not elsewhere classified: Secondary | ICD-10-CM | POA: Diagnosis not present

## 2015-01-25 DIAGNOSIS — R2689 Other abnormalities of gait and mobility: Secondary | ICD-10-CM | POA: Diagnosis not present

## 2015-01-27 DIAGNOSIS — R262 Difficulty in walking, not elsewhere classified: Secondary | ICD-10-CM | POA: Diagnosis not present

## 2015-01-27 DIAGNOSIS — M6281 Muscle weakness (generalized): Secondary | ICD-10-CM | POA: Diagnosis not present

## 2015-01-27 DIAGNOSIS — R2689 Other abnormalities of gait and mobility: Secondary | ICD-10-CM | POA: Diagnosis not present

## 2015-01-31 DIAGNOSIS — M6281 Muscle weakness (generalized): Secondary | ICD-10-CM | POA: Diagnosis not present

## 2015-01-31 DIAGNOSIS — R2689 Other abnormalities of gait and mobility: Secondary | ICD-10-CM | POA: Diagnosis not present

## 2015-01-31 DIAGNOSIS — R262 Difficulty in walking, not elsewhere classified: Secondary | ICD-10-CM | POA: Diagnosis not present

## 2015-02-23 DIAGNOSIS — Z7901 Long term (current) use of anticoagulants: Secondary | ICD-10-CM | POA: Diagnosis not present

## 2015-02-27 DIAGNOSIS — M9903 Segmental and somatic dysfunction of lumbar region: Secondary | ICD-10-CM | POA: Diagnosis not present

## 2015-02-27 DIAGNOSIS — M5137 Other intervertebral disc degeneration, lumbosacral region: Secondary | ICD-10-CM | POA: Diagnosis not present

## 2015-02-27 DIAGNOSIS — M9904 Segmental and somatic dysfunction of sacral region: Secondary | ICD-10-CM | POA: Diagnosis not present

## 2015-02-27 DIAGNOSIS — M791 Myalgia: Secondary | ICD-10-CM | POA: Diagnosis not present

## 2015-02-27 DIAGNOSIS — M5441 Lumbago with sciatica, right side: Secondary | ICD-10-CM | POA: Diagnosis not present

## 2015-02-28 DIAGNOSIS — M9904 Segmental and somatic dysfunction of sacral region: Secondary | ICD-10-CM | POA: Diagnosis not present

## 2015-02-28 DIAGNOSIS — M5137 Other intervertebral disc degeneration, lumbosacral region: Secondary | ICD-10-CM | POA: Diagnosis not present

## 2015-02-28 DIAGNOSIS — M791 Myalgia: Secondary | ICD-10-CM | POA: Diagnosis not present

## 2015-02-28 DIAGNOSIS — M9903 Segmental and somatic dysfunction of lumbar region: Secondary | ICD-10-CM | POA: Diagnosis not present

## 2015-02-28 DIAGNOSIS — M5441 Lumbago with sciatica, right side: Secondary | ICD-10-CM | POA: Diagnosis not present

## 2015-03-01 DIAGNOSIS — M9904 Segmental and somatic dysfunction of sacral region: Secondary | ICD-10-CM | POA: Diagnosis not present

## 2015-03-01 DIAGNOSIS — M5137 Other intervertebral disc degeneration, lumbosacral region: Secondary | ICD-10-CM | POA: Diagnosis not present

## 2015-03-01 DIAGNOSIS — M791 Myalgia: Secondary | ICD-10-CM | POA: Diagnosis not present

## 2015-03-01 DIAGNOSIS — M5441 Lumbago with sciatica, right side: Secondary | ICD-10-CM | POA: Diagnosis not present

## 2015-03-01 DIAGNOSIS — M9903 Segmental and somatic dysfunction of lumbar region: Secondary | ICD-10-CM | POA: Diagnosis not present

## 2015-03-02 DIAGNOSIS — M791 Myalgia: Secondary | ICD-10-CM | POA: Diagnosis not present

## 2015-03-02 DIAGNOSIS — M5137 Other intervertebral disc degeneration, lumbosacral region: Secondary | ICD-10-CM | POA: Diagnosis not present

## 2015-03-02 DIAGNOSIS — M9903 Segmental and somatic dysfunction of lumbar region: Secondary | ICD-10-CM | POA: Diagnosis not present

## 2015-03-02 DIAGNOSIS — M5441 Lumbago with sciatica, right side: Secondary | ICD-10-CM | POA: Diagnosis not present

## 2015-03-02 DIAGNOSIS — M9904 Segmental and somatic dysfunction of sacral region: Secondary | ICD-10-CM | POA: Diagnosis not present

## 2015-03-06 DIAGNOSIS — M5137 Other intervertebral disc degeneration, lumbosacral region: Secondary | ICD-10-CM | POA: Diagnosis not present

## 2015-03-06 DIAGNOSIS — M9903 Segmental and somatic dysfunction of lumbar region: Secondary | ICD-10-CM | POA: Diagnosis not present

## 2015-03-06 DIAGNOSIS — M9904 Segmental and somatic dysfunction of sacral region: Secondary | ICD-10-CM | POA: Diagnosis not present

## 2015-03-06 DIAGNOSIS — M5441 Lumbago with sciatica, right side: Secondary | ICD-10-CM | POA: Diagnosis not present

## 2015-03-06 DIAGNOSIS — M791 Myalgia: Secondary | ICD-10-CM | POA: Diagnosis not present

## 2015-03-07 DIAGNOSIS — M791 Myalgia: Secondary | ICD-10-CM | POA: Diagnosis not present

## 2015-03-07 DIAGNOSIS — M9904 Segmental and somatic dysfunction of sacral region: Secondary | ICD-10-CM | POA: Diagnosis not present

## 2015-03-07 DIAGNOSIS — M5137 Other intervertebral disc degeneration, lumbosacral region: Secondary | ICD-10-CM | POA: Diagnosis not present

## 2015-03-07 DIAGNOSIS — M5441 Lumbago with sciatica, right side: Secondary | ICD-10-CM | POA: Diagnosis not present

## 2015-03-07 DIAGNOSIS — M9903 Segmental and somatic dysfunction of lumbar region: Secondary | ICD-10-CM | POA: Diagnosis not present

## 2015-03-08 DIAGNOSIS — M5441 Lumbago with sciatica, right side: Secondary | ICD-10-CM | POA: Diagnosis not present

## 2015-03-08 DIAGNOSIS — M9904 Segmental and somatic dysfunction of sacral region: Secondary | ICD-10-CM | POA: Diagnosis not present

## 2015-03-08 DIAGNOSIS — M5137 Other intervertebral disc degeneration, lumbosacral region: Secondary | ICD-10-CM | POA: Diagnosis not present

## 2015-03-08 DIAGNOSIS — M791 Myalgia: Secondary | ICD-10-CM | POA: Diagnosis not present

## 2015-03-08 DIAGNOSIS — M9903 Segmental and somatic dysfunction of lumbar region: Secondary | ICD-10-CM | POA: Diagnosis not present

## 2015-03-14 DIAGNOSIS — M5137 Other intervertebral disc degeneration, lumbosacral region: Secondary | ICD-10-CM | POA: Diagnosis not present

## 2015-03-14 DIAGNOSIS — M5441 Lumbago with sciatica, right side: Secondary | ICD-10-CM | POA: Diagnosis not present

## 2015-03-14 DIAGNOSIS — M9903 Segmental and somatic dysfunction of lumbar region: Secondary | ICD-10-CM | POA: Diagnosis not present

## 2015-03-14 DIAGNOSIS — M791 Myalgia: Secondary | ICD-10-CM | POA: Diagnosis not present

## 2015-03-14 DIAGNOSIS — M9904 Segmental and somatic dysfunction of sacral region: Secondary | ICD-10-CM | POA: Diagnosis not present

## 2015-03-15 DIAGNOSIS — M791 Myalgia: Secondary | ICD-10-CM | POA: Diagnosis not present

## 2015-03-15 DIAGNOSIS — M5441 Lumbago with sciatica, right side: Secondary | ICD-10-CM | POA: Diagnosis not present

## 2015-03-15 DIAGNOSIS — M9903 Segmental and somatic dysfunction of lumbar region: Secondary | ICD-10-CM | POA: Diagnosis not present

## 2015-03-15 DIAGNOSIS — M9904 Segmental and somatic dysfunction of sacral region: Secondary | ICD-10-CM | POA: Diagnosis not present

## 2015-03-15 DIAGNOSIS — M5137 Other intervertebral disc degeneration, lumbosacral region: Secondary | ICD-10-CM | POA: Diagnosis not present

## 2015-03-16 DIAGNOSIS — M791 Myalgia: Secondary | ICD-10-CM | POA: Diagnosis not present

## 2015-03-16 DIAGNOSIS — M5137 Other intervertebral disc degeneration, lumbosacral region: Secondary | ICD-10-CM | POA: Diagnosis not present

## 2015-03-16 DIAGNOSIS — M9903 Segmental and somatic dysfunction of lumbar region: Secondary | ICD-10-CM | POA: Diagnosis not present

## 2015-03-16 DIAGNOSIS — M5441 Lumbago with sciatica, right side: Secondary | ICD-10-CM | POA: Diagnosis not present

## 2015-03-16 DIAGNOSIS — M9904 Segmental and somatic dysfunction of sacral region: Secondary | ICD-10-CM | POA: Diagnosis not present

## 2015-03-20 DIAGNOSIS — M5441 Lumbago with sciatica, right side: Secondary | ICD-10-CM | POA: Diagnosis not present

## 2015-03-20 DIAGNOSIS — M9903 Segmental and somatic dysfunction of lumbar region: Secondary | ICD-10-CM | POA: Diagnosis not present

## 2015-03-20 DIAGNOSIS — M9904 Segmental and somatic dysfunction of sacral region: Secondary | ICD-10-CM | POA: Diagnosis not present

## 2015-03-20 DIAGNOSIS — M5137 Other intervertebral disc degeneration, lumbosacral region: Secondary | ICD-10-CM | POA: Diagnosis not present

## 2015-03-20 DIAGNOSIS — M791 Myalgia: Secondary | ICD-10-CM | POA: Diagnosis not present

## 2015-03-22 DIAGNOSIS — M9904 Segmental and somatic dysfunction of sacral region: Secondary | ICD-10-CM | POA: Diagnosis not present

## 2015-03-22 DIAGNOSIS — M791 Myalgia: Secondary | ICD-10-CM | POA: Diagnosis not present

## 2015-03-22 DIAGNOSIS — M9903 Segmental and somatic dysfunction of lumbar region: Secondary | ICD-10-CM | POA: Diagnosis not present

## 2015-03-22 DIAGNOSIS — M5137 Other intervertebral disc degeneration, lumbosacral region: Secondary | ICD-10-CM | POA: Diagnosis not present

## 2015-03-22 DIAGNOSIS — M5441 Lumbago with sciatica, right side: Secondary | ICD-10-CM | POA: Diagnosis not present

## 2015-03-23 DIAGNOSIS — M791 Myalgia: Secondary | ICD-10-CM | POA: Diagnosis not present

## 2015-03-23 DIAGNOSIS — M9903 Segmental and somatic dysfunction of lumbar region: Secondary | ICD-10-CM | POA: Diagnosis not present

## 2015-03-23 DIAGNOSIS — M5441 Lumbago with sciatica, right side: Secondary | ICD-10-CM | POA: Diagnosis not present

## 2015-03-23 DIAGNOSIS — M5137 Other intervertebral disc degeneration, lumbosacral region: Secondary | ICD-10-CM | POA: Diagnosis not present

## 2015-03-23 DIAGNOSIS — M9904 Segmental and somatic dysfunction of sacral region: Secondary | ICD-10-CM | POA: Diagnosis not present

## 2015-03-28 DIAGNOSIS — M9904 Segmental and somatic dysfunction of sacral region: Secondary | ICD-10-CM | POA: Diagnosis not present

## 2015-03-28 DIAGNOSIS — M9903 Segmental and somatic dysfunction of lumbar region: Secondary | ICD-10-CM | POA: Diagnosis not present

## 2015-03-28 DIAGNOSIS — M5137 Other intervertebral disc degeneration, lumbosacral region: Secondary | ICD-10-CM | POA: Diagnosis not present

## 2015-03-28 DIAGNOSIS — M5441 Lumbago with sciatica, right side: Secondary | ICD-10-CM | POA: Diagnosis not present

## 2015-03-28 DIAGNOSIS — M791 Myalgia: Secondary | ICD-10-CM | POA: Diagnosis not present

## 2015-03-30 DIAGNOSIS — M791 Myalgia: Secondary | ICD-10-CM | POA: Diagnosis not present

## 2015-03-30 DIAGNOSIS — M9904 Segmental and somatic dysfunction of sacral region: Secondary | ICD-10-CM | POA: Diagnosis not present

## 2015-03-30 DIAGNOSIS — M5137 Other intervertebral disc degeneration, lumbosacral region: Secondary | ICD-10-CM | POA: Diagnosis not present

## 2015-03-30 DIAGNOSIS — M9903 Segmental and somatic dysfunction of lumbar region: Secondary | ICD-10-CM | POA: Diagnosis not present

## 2015-03-30 DIAGNOSIS — M5441 Lumbago with sciatica, right side: Secondary | ICD-10-CM | POA: Diagnosis not present

## 2015-04-03 DIAGNOSIS — Z7901 Long term (current) use of anticoagulants: Secondary | ICD-10-CM | POA: Diagnosis not present

## 2015-04-04 DIAGNOSIS — M5137 Other intervertebral disc degeneration, lumbosacral region: Secondary | ICD-10-CM | POA: Diagnosis not present

## 2015-04-04 DIAGNOSIS — M791 Myalgia: Secondary | ICD-10-CM | POA: Diagnosis not present

## 2015-04-04 DIAGNOSIS — M9904 Segmental and somatic dysfunction of sacral region: Secondary | ICD-10-CM | POA: Diagnosis not present

## 2015-04-04 DIAGNOSIS — M9903 Segmental and somatic dysfunction of lumbar region: Secondary | ICD-10-CM | POA: Diagnosis not present

## 2015-04-04 DIAGNOSIS — M5441 Lumbago with sciatica, right side: Secondary | ICD-10-CM | POA: Diagnosis not present

## 2015-04-06 DIAGNOSIS — M5137 Other intervertebral disc degeneration, lumbosacral region: Secondary | ICD-10-CM | POA: Diagnosis not present

## 2015-04-06 DIAGNOSIS — M9903 Segmental and somatic dysfunction of lumbar region: Secondary | ICD-10-CM | POA: Diagnosis not present

## 2015-04-06 DIAGNOSIS — M9904 Segmental and somatic dysfunction of sacral region: Secondary | ICD-10-CM | POA: Diagnosis not present

## 2015-04-06 DIAGNOSIS — M791 Myalgia: Secondary | ICD-10-CM | POA: Diagnosis not present

## 2015-04-06 DIAGNOSIS — M5441 Lumbago with sciatica, right side: Secondary | ICD-10-CM | POA: Diagnosis not present

## 2015-04-11 DIAGNOSIS — M9904 Segmental and somatic dysfunction of sacral region: Secondary | ICD-10-CM | POA: Diagnosis not present

## 2015-04-11 DIAGNOSIS — M5137 Other intervertebral disc degeneration, lumbosacral region: Secondary | ICD-10-CM | POA: Diagnosis not present

## 2015-04-11 DIAGNOSIS — M791 Myalgia: Secondary | ICD-10-CM | POA: Diagnosis not present

## 2015-04-11 DIAGNOSIS — M5441 Lumbago with sciatica, right side: Secondary | ICD-10-CM | POA: Diagnosis not present

## 2015-04-11 DIAGNOSIS — M9903 Segmental and somatic dysfunction of lumbar region: Secondary | ICD-10-CM | POA: Diagnosis not present

## 2015-04-13 DIAGNOSIS — M791 Myalgia: Secondary | ICD-10-CM | POA: Diagnosis not present

## 2015-04-13 DIAGNOSIS — M9904 Segmental and somatic dysfunction of sacral region: Secondary | ICD-10-CM | POA: Diagnosis not present

## 2015-04-13 DIAGNOSIS — M5441 Lumbago with sciatica, right side: Secondary | ICD-10-CM | POA: Diagnosis not present

## 2015-04-13 DIAGNOSIS — M5137 Other intervertebral disc degeneration, lumbosacral region: Secondary | ICD-10-CM | POA: Diagnosis not present

## 2015-04-13 DIAGNOSIS — M9903 Segmental and somatic dysfunction of lumbar region: Secondary | ICD-10-CM | POA: Diagnosis not present

## 2015-04-18 ENCOUNTER — Telehealth: Payer: Self-pay

## 2015-04-18 DIAGNOSIS — M9904 Segmental and somatic dysfunction of sacral region: Secondary | ICD-10-CM | POA: Diagnosis not present

## 2015-04-18 DIAGNOSIS — M5137 Other intervertebral disc degeneration, lumbosacral region: Secondary | ICD-10-CM | POA: Diagnosis not present

## 2015-04-18 DIAGNOSIS — M5441 Lumbago with sciatica, right side: Secondary | ICD-10-CM | POA: Diagnosis not present

## 2015-04-18 DIAGNOSIS — M791 Myalgia: Secondary | ICD-10-CM | POA: Diagnosis not present

## 2015-04-18 DIAGNOSIS — M9903 Segmental and somatic dysfunction of lumbar region: Secondary | ICD-10-CM | POA: Diagnosis not present

## 2015-04-18 NOTE — Telephone Encounter (Signed)
Pt with +Cologuard. Dr. Henrene Pastor would like to see pt to discuss. Pt scheduled to see Dr. Henrene Pastor 06/01/15@1 :30pm. Left message for pt to call back.  Spoke with pt and she is aware of appt. Copy of cologuard placed on Dr. Blanch [employer] desk and copy sent to be scanned into [facility] asap.

## 2015-04-19 ENCOUNTER — Encounter: Payer: Self-pay | Admitting: Internal Medicine

## 2015-04-20 DIAGNOSIS — M9904 Segmental and somatic dysfunction of sacral region: Secondary | ICD-10-CM | POA: Diagnosis not present

## 2015-04-20 DIAGNOSIS — M9903 Segmental and somatic dysfunction of lumbar region: Secondary | ICD-10-CM | POA: Diagnosis not present

## 2015-04-20 DIAGNOSIS — M5441 Lumbago with sciatica, right side: Secondary | ICD-10-CM | POA: Diagnosis not present

## 2015-04-20 DIAGNOSIS — M791 Myalgia: Secondary | ICD-10-CM | POA: Diagnosis not present

## 2015-04-20 DIAGNOSIS — M5137 Other intervertebral disc degeneration, lumbosacral region: Secondary | ICD-10-CM | POA: Diagnosis not present

## 2015-05-01 DIAGNOSIS — I69959 Hemiplegia and hemiparesis following unspecified cerebrovascular disease affecting unspecified side: Secondary | ICD-10-CM | POA: Diagnosis not present

## 2015-05-01 DIAGNOSIS — M859 Disorder of bone density and structure, unspecified: Secondary | ICD-10-CM | POA: Diagnosis not present

## 2015-05-01 DIAGNOSIS — D689 Coagulation defect, unspecified: Secondary | ICD-10-CM | POA: Diagnosis not present

## 2015-05-01 DIAGNOSIS — R7301 Impaired fasting glucose: Secondary | ICD-10-CM | POA: Diagnosis not present

## 2015-05-01 DIAGNOSIS — L989 Disorder of the skin and subcutaneous tissue, unspecified: Secondary | ICD-10-CM | POA: Diagnosis not present

## 2015-05-01 DIAGNOSIS — I10 Essential (primary) hypertension: Secondary | ICD-10-CM | POA: Diagnosis not present

## 2015-05-01 DIAGNOSIS — Z7901 Long term (current) use of anticoagulants: Secondary | ICD-10-CM | POA: Diagnosis not present

## 2015-05-01 DIAGNOSIS — Z6831 Body mass index (BMI) 31.0-31.9, adult: Secondary | ICD-10-CM | POA: Diagnosis not present

## 2015-05-01 DIAGNOSIS — E785 Hyperlipidemia, unspecified: Secondary | ICD-10-CM | POA: Diagnosis not present

## 2015-05-11 DIAGNOSIS — C44729 Squamous cell carcinoma of skin of left lower limb, including hip: Secondary | ICD-10-CM | POA: Diagnosis not present

## 2015-06-01 ENCOUNTER — Encounter: Payer: Self-pay | Admitting: Internal Medicine

## 2015-06-01 ENCOUNTER — Ambulatory Visit (INDEPENDENT_AMBULATORY_CARE_PROVIDER_SITE_OTHER): Payer: Medicare Other | Admitting: Internal Medicine

## 2015-06-01 VITALS — BP 118/64 | HR 80 | Ht 64.0 in | Wt 181.4 lb

## 2015-06-01 DIAGNOSIS — R195 Other fecal abnormalities: Secondary | ICD-10-CM | POA: Diagnosis not present

## 2015-06-01 DIAGNOSIS — K921 Melena: Secondary | ICD-10-CM | POA: Diagnosis not present

## 2015-06-01 DIAGNOSIS — Z7901 Long term (current) use of anticoagulants: Secondary | ICD-10-CM | POA: Diagnosis not present

## 2015-06-01 DIAGNOSIS — K625 Hemorrhage of anus and rectum: Secondary | ICD-10-CM | POA: Diagnosis not present

## 2015-06-01 DIAGNOSIS — I6330 Cerebral infarction due to thrombosis of unspecified cerebral artery: Secondary | ICD-10-CM

## 2015-06-01 NOTE — Progress Notes (Signed)
HISTORY OF PRESENT ILLNESS:  [name] is a 69 y.o. female who was initially evaluated in consultation, upon referral by Dr. Joylene Draft, regarding a family history of colon cancer (sister at age 74) and colon cancer screening. The patient had not had previous colon cancer screening. However, she has significant comorbidities as outlined including post stroke deficits and the need for chronic anticoagulation. The patient had no GI symptoms, normal hemoglobin, and Hemoccult-negative stool. We elected to perform Cologaurd which returned positive (released 01/05/2015 [facility]). This follow-up appointment made to discuss. Patient is accompanied today by her daughter. GI review of systems remains negative. Patient states that she is not dizzy ask about the prospects of coming off anticoagulation with Lovenox bridge  REVIEW OF SYSTEMS:  All non-GI ROS negative except for sinus and allergy, back pain  Past Medical History  Diagnosis Date  . Clotting disorder   . Hyperlipidemia   . Hypertension   . Diabetes mellitus without complication     no meds-diet controlled  . Stroke 2010  . Heart murmur     mild mitral regurg-echo 2010  . Allergic rhinitis   . Lupus anticoagulant positive   . Obesity     Past Surgical History  Procedure Laterality Date  . Abdominal hysterectomy  1981  . Appendectomy    . Dilation and curettage of uterus    . Axillary lymph node biopsy Left 05/16/2014    Procedure: EXCISION 2CM LEFT AXILLARY MASS;  Surgeon: [name], MD;  Location: [facility];  Service: General;  Laterality: Left;  Left axillary sebaceous cyst excision  . Skin cancer excision  2016    [facility]    Social History [name]  reports that she quit smoking about 6 years ago. Her smoking use included Cigarettes. She has never used smokeless tobacco. She reports that she does not drink alcohol or use illicit drugs.  family history includes COPD in her father; Clotting  disorder in her sister; Colon cancer in her sister; Diabetes in her sister; Heart disease in her brother and mother; Kidney disease in her sister.  Allergies  Allergen Reactions  . Codeine     hallucination       PHYSICAL EXAMINATION: Vital signs: BP 118/64 mmHg  Pulse 80  Ht 5\' 4"  (1.626 m)  Wt 181 lb 6 oz (82.271 kg)  BMI 31.12 kg/m2 General: Well-developed, well-nourished, no acute distress Abdomen: Not reexamined Psychiatric: alert and oriented x3. Cooperative  ASSESSMENT:  #1. Positive cologard. We discussed the data regarding positive cologard. 3.7% finding of colon cancer, 20% with advanced adenoma, 31% with non-advanced adenoma, and 45% with no adenoma. We also discussed ways to further evaluate the colon and focused on virtual colonoscopy and optical colonoscopy. Risk-benefit strategy of each discussed in detail. Multiple questions answered  PLAN:  #1. Virtual colonoscopy. If no significant abnormalities found, no further workup and could consider repeat virtual colonoscopy in 5 years. If small polyps found, could watch. If significant pathology found, would need optical colonoscopy.  20 minutes was spent with the patient and her daughter. Essentially the entire time used for counseling

## 2015-06-01 NOTE — Patient Instructions (Signed)
You have been scheduled for a virtual colonoscopy at [facility] located at [place].        If you need to reschedule this appointment please call [phone].

## 2015-06-08 ENCOUNTER — Telehealth: Payer: Self-pay | Admitting: Internal Medicine

## 2015-06-08 NOTE — Telephone Encounter (Signed)
Pt calling about virtual colon that is supposed to be scheduled per last OV note. Magda Paganini do you know about this?

## 2015-06-09 NOTE — Telephone Encounter (Signed)
Lm for patient regarding virtual colonoscopy scheduled for 06/15/2015 - she will need to go by [facility] Monday to get the prep and instructions from them.  I requested that she call me back to clarify that she understood everything.

## 2015-06-15 ENCOUNTER — Ambulatory Visit
Admission: RE | Admit: 2015-06-15 | Discharge: 2015-06-15 | Disposition: A | Discharge status: Home / Self Care | Payer: Medicare Other | Source: Ambulatory Visit | Attending: Internal Medicine | Admitting: Internal Medicine

## 2015-06-15 DIAGNOSIS — Z7901 Long term (current) use of anticoagulants: Secondary | ICD-10-CM | POA: Diagnosis not present

## 2015-06-15 DIAGNOSIS — K5730 Diverticulosis of large intestine without perforation or abscess without bleeding: Secondary | ICD-10-CM | POA: Diagnosis not present

## 2015-06-15 DIAGNOSIS — K921 Melena: Secondary | ICD-10-CM

## 2015-06-15 DIAGNOSIS — R195 Other fecal abnormalities: Secondary | ICD-10-CM

## 2015-06-16 NOTE — Telephone Encounter (Signed)
Patient did not return my call but successfully completed virtual colonoscopy

## 2015-07-04 DIAGNOSIS — M791 Myalgia: Secondary | ICD-10-CM | POA: Diagnosis not present

## 2015-07-04 DIAGNOSIS — M9904 Segmental and somatic dysfunction of sacral region: Secondary | ICD-10-CM | POA: Diagnosis not present

## 2015-07-04 DIAGNOSIS — M9903 Segmental and somatic dysfunction of lumbar region: Secondary | ICD-10-CM | POA: Diagnosis not present

## 2015-07-04 DIAGNOSIS — M5441 Lumbago with sciatica, right side: Secondary | ICD-10-CM | POA: Diagnosis not present

## 2015-07-04 DIAGNOSIS — M5137 Other intervertebral disc degeneration, lumbosacral region: Secondary | ICD-10-CM | POA: Diagnosis not present

## 2015-07-18 DIAGNOSIS — Z01419 Encounter for gynecological examination (general) (routine) without abnormal findings: Secondary | ICD-10-CM | POA: Diagnosis not present

## 2015-07-27 DIAGNOSIS — Z7901 Long term (current) use of anticoagulants: Secondary | ICD-10-CM | POA: Diagnosis not present

## 2015-07-27 DIAGNOSIS — Z23 Encounter for immunization: Secondary | ICD-10-CM | POA: Diagnosis not present

## 2015-08-01 DIAGNOSIS — M791 Myalgia: Secondary | ICD-10-CM | POA: Diagnosis not present

## 2015-08-01 DIAGNOSIS — M9903 Segmental and somatic dysfunction of lumbar region: Secondary | ICD-10-CM | POA: Diagnosis not present

## 2015-08-01 DIAGNOSIS — M9904 Segmental and somatic dysfunction of sacral region: Secondary | ICD-10-CM | POA: Diagnosis not present

## 2015-08-01 DIAGNOSIS — M5137 Other intervertebral disc degeneration, lumbosacral region: Secondary | ICD-10-CM | POA: Diagnosis not present

## 2015-08-01 DIAGNOSIS — M5441 Lumbago with sciatica, right side: Secondary | ICD-10-CM | POA: Diagnosis not present

## 2015-08-16 DIAGNOSIS — M6281 Muscle weakness (generalized): Secondary | ICD-10-CM | POA: Diagnosis not present

## 2015-08-16 DIAGNOSIS — R262 Difficulty in walking, not elsewhere classified: Secondary | ICD-10-CM | POA: Diagnosis not present

## 2015-08-16 DIAGNOSIS — R2689 Other abnormalities of gait and mobility: Secondary | ICD-10-CM | POA: Diagnosis not present

## 2015-08-29 DIAGNOSIS — M9903 Segmental and somatic dysfunction of lumbar region: Secondary | ICD-10-CM | POA: Diagnosis not present

## 2015-08-29 DIAGNOSIS — M5441 Lumbago with sciatica, right side: Secondary | ICD-10-CM | POA: Diagnosis not present

## 2015-08-29 DIAGNOSIS — M791 Myalgia: Secondary | ICD-10-CM | POA: Diagnosis not present

## 2015-08-29 DIAGNOSIS — M9904 Segmental and somatic dysfunction of sacral region: Secondary | ICD-10-CM | POA: Diagnosis not present

## 2015-08-29 DIAGNOSIS — M5137 Other intervertebral disc degeneration, lumbosacral region: Secondary | ICD-10-CM | POA: Diagnosis not present

## 2015-09-01 DIAGNOSIS — Z7901 Long term (current) use of anticoagulants: Secondary | ICD-10-CM | POA: Diagnosis not present

## 2015-09-19 DIAGNOSIS — N390 Urinary tract infection, site not specified: Secondary | ICD-10-CM | POA: Diagnosis not present

## 2015-09-19 DIAGNOSIS — Z7901 Long term (current) use of anticoagulants: Secondary | ICD-10-CM | POA: Diagnosis not present

## 2015-09-19 DIAGNOSIS — Z85828 Personal history of other malignant neoplasm of skin: Secondary | ICD-10-CM | POA: Diagnosis not present

## 2015-09-19 DIAGNOSIS — R8299 Other abnormal findings in urine: Secondary | ICD-10-CM | POA: Diagnosis not present

## 2015-09-26 DIAGNOSIS — M791 Myalgia: Secondary | ICD-10-CM | POA: Diagnosis not present

## 2015-09-26 DIAGNOSIS — M5441 Lumbago with sciatica, right side: Secondary | ICD-10-CM | POA: Diagnosis not present

## 2015-09-26 DIAGNOSIS — M9904 Segmental and somatic dysfunction of sacral region: Secondary | ICD-10-CM | POA: Diagnosis not present

## 2015-09-26 DIAGNOSIS — M9903 Segmental and somatic dysfunction of lumbar region: Secondary | ICD-10-CM | POA: Diagnosis not present

## 2015-09-26 DIAGNOSIS — M5137 Other intervertebral disc degeneration, lumbosacral region: Secondary | ICD-10-CM | POA: Diagnosis not present

## 2015-10-17 DIAGNOSIS — J069 Acute upper respiratory infection, unspecified: Secondary | ICD-10-CM | POA: Diagnosis not present

## 2015-10-17 DIAGNOSIS — D689 Coagulation defect, unspecified: Secondary | ICD-10-CM | POA: Diagnosis not present

## 2015-10-17 DIAGNOSIS — Z7901 Long term (current) use of anticoagulants: Secondary | ICD-10-CM | POA: Diagnosis not present

## 2015-10-17 DIAGNOSIS — Z6831 Body mass index (BMI) 31.0-31.9, adult: Secondary | ICD-10-CM | POA: Diagnosis not present

## 2015-11-07 DIAGNOSIS — M5137 Other intervertebral disc degeneration, lumbosacral region: Secondary | ICD-10-CM | POA: Diagnosis not present

## 2015-11-07 DIAGNOSIS — M791 Myalgia: Secondary | ICD-10-CM | POA: Diagnosis not present

## 2015-11-07 DIAGNOSIS — M5441 Lumbago with sciatica, right side: Secondary | ICD-10-CM | POA: Diagnosis not present

## 2015-11-07 DIAGNOSIS — M9903 Segmental and somatic dysfunction of lumbar region: Secondary | ICD-10-CM | POA: Diagnosis not present

## 2015-11-07 DIAGNOSIS — M9904 Segmental and somatic dysfunction of sacral region: Secondary | ICD-10-CM | POA: Diagnosis not present

## 2015-11-14 DIAGNOSIS — R102 Pelvic and perineal pain: Secondary | ICD-10-CM | POA: Diagnosis not present

## 2015-11-16 DIAGNOSIS — M9904 Segmental and somatic dysfunction of sacral region: Secondary | ICD-10-CM | POA: Diagnosis not present

## 2015-11-16 DIAGNOSIS — M9903 Segmental and somatic dysfunction of lumbar region: Secondary | ICD-10-CM | POA: Diagnosis not present

## 2015-11-16 DIAGNOSIS — M791 Myalgia: Secondary | ICD-10-CM | POA: Diagnosis not present

## 2015-11-16 DIAGNOSIS — M5441 Lumbago with sciatica, right side: Secondary | ICD-10-CM | POA: Diagnosis not present

## 2015-11-16 DIAGNOSIS — M5137 Other intervertebral disc degeneration, lumbosacral region: Secondary | ICD-10-CM | POA: Diagnosis not present

## 2015-11-17 DIAGNOSIS — M9904 Segmental and somatic dysfunction of sacral region: Secondary | ICD-10-CM | POA: Diagnosis not present

## 2015-11-17 DIAGNOSIS — M791 Myalgia: Secondary | ICD-10-CM | POA: Diagnosis not present

## 2015-11-17 DIAGNOSIS — M5441 Lumbago with sciatica, right side: Secondary | ICD-10-CM | POA: Diagnosis not present

## 2015-11-17 DIAGNOSIS — M5137 Other intervertebral disc degeneration, lumbosacral region: Secondary | ICD-10-CM | POA: Diagnosis not present

## 2015-11-17 DIAGNOSIS — M9903 Segmental and somatic dysfunction of lumbar region: Secondary | ICD-10-CM | POA: Diagnosis not present

## 2015-11-21 DIAGNOSIS — M5441 Lumbago with sciatica, right side: Secondary | ICD-10-CM | POA: Diagnosis not present

## 2015-11-21 DIAGNOSIS — M5137 Other intervertebral disc degeneration, lumbosacral region: Secondary | ICD-10-CM | POA: Diagnosis not present

## 2015-11-21 DIAGNOSIS — M9903 Segmental and somatic dysfunction of lumbar region: Secondary | ICD-10-CM | POA: Diagnosis not present

## 2015-11-21 DIAGNOSIS — M791 Myalgia: Secondary | ICD-10-CM | POA: Diagnosis not present

## 2015-11-21 DIAGNOSIS — M9904 Segmental and somatic dysfunction of sacral region: Secondary | ICD-10-CM | POA: Diagnosis not present

## 2015-11-23 DIAGNOSIS — M5137 Other intervertebral disc degeneration, lumbosacral region: Secondary | ICD-10-CM | POA: Diagnosis not present

## 2015-11-23 DIAGNOSIS — M9904 Segmental and somatic dysfunction of sacral region: Secondary | ICD-10-CM | POA: Diagnosis not present

## 2015-11-23 DIAGNOSIS — M5441 Lumbago with sciatica, right side: Secondary | ICD-10-CM | POA: Diagnosis not present

## 2015-11-23 DIAGNOSIS — M9903 Segmental and somatic dysfunction of lumbar region: Secondary | ICD-10-CM | POA: Diagnosis not present

## 2015-11-23 DIAGNOSIS — M791 Myalgia: Secondary | ICD-10-CM | POA: Diagnosis not present

## 2015-11-30 DIAGNOSIS — E784 Other hyperlipidemia: Secondary | ICD-10-CM | POA: Diagnosis not present

## 2015-11-30 DIAGNOSIS — I10 Essential (primary) hypertension: Secondary | ICD-10-CM | POA: Diagnosis not present

## 2015-11-30 DIAGNOSIS — Z7901 Long term (current) use of anticoagulants: Secondary | ICD-10-CM | POA: Diagnosis not present

## 2015-11-30 DIAGNOSIS — E119 Type 2 diabetes mellitus without complications: Secondary | ICD-10-CM | POA: Diagnosis not present

## 2015-11-30 DIAGNOSIS — M859 Disorder of bone density and structure, unspecified: Secondary | ICD-10-CM | POA: Diagnosis not present

## 2015-12-05 DIAGNOSIS — M9903 Segmental and somatic dysfunction of lumbar region: Secondary | ICD-10-CM | POA: Diagnosis not present

## 2015-12-05 DIAGNOSIS — M9904 Segmental and somatic dysfunction of sacral region: Secondary | ICD-10-CM | POA: Diagnosis not present

## 2015-12-05 DIAGNOSIS — M5137 Other intervertebral disc degeneration, lumbosacral region: Secondary | ICD-10-CM | POA: Diagnosis not present

## 2015-12-05 DIAGNOSIS — M791 Myalgia: Secondary | ICD-10-CM | POA: Diagnosis not present

## 2015-12-05 DIAGNOSIS — M5441 Lumbago with sciatica, right side: Secondary | ICD-10-CM | POA: Diagnosis not present

## 2015-12-06 DIAGNOSIS — D688 Other specified coagulation defects: Secondary | ICD-10-CM | POA: Diagnosis not present

## 2015-12-06 DIAGNOSIS — M859 Disorder of bone density and structure, unspecified: Secondary | ICD-10-CM | POA: Diagnosis not present

## 2015-12-06 DIAGNOSIS — M25551 Pain in right hip: Secondary | ICD-10-CM | POA: Diagnosis not present

## 2015-12-06 DIAGNOSIS — Z0000 Encounter for general adult medical examination without abnormal findings: Secondary | ICD-10-CM | POA: Diagnosis not present

## 2015-12-06 DIAGNOSIS — Z6830 Body mass index (BMI) 30.0-30.9, adult: Secondary | ICD-10-CM | POA: Diagnosis not present

## 2015-12-06 DIAGNOSIS — I10 Essential (primary) hypertension: Secondary | ICD-10-CM | POA: Diagnosis not present

## 2015-12-06 DIAGNOSIS — I69959 Hemiplegia and hemiparesis following unspecified cerebrovascular disease affecting unspecified side: Secondary | ICD-10-CM | POA: Diagnosis not present

## 2015-12-06 DIAGNOSIS — F329 Major depressive disorder, single episode, unspecified: Secondary | ICD-10-CM | POA: Diagnosis not present

## 2015-12-06 DIAGNOSIS — E784 Other hyperlipidemia: Secondary | ICD-10-CM | POA: Diagnosis not present

## 2015-12-06 DIAGNOSIS — Z1389 Encounter for screening for other disorder: Secondary | ICD-10-CM | POA: Diagnosis not present

## 2015-12-06 DIAGNOSIS — R7301 Impaired fasting glucose: Secondary | ICD-10-CM | POA: Diagnosis not present

## 2015-12-06 DIAGNOSIS — R2689 Other abnormalities of gait and mobility: Secondary | ICD-10-CM | POA: Diagnosis not present

## 2015-12-15 DIAGNOSIS — Z85828 Personal history of other malignant neoplasm of skin: Secondary | ICD-10-CM | POA: Diagnosis not present

## 2015-12-15 DIAGNOSIS — D2262 Melanocytic nevi of left upper limb, including shoulder: Secondary | ICD-10-CM | POA: Diagnosis not present

## 2015-12-15 DIAGNOSIS — D1801 Hemangioma of skin and subcutaneous tissue: Secondary | ICD-10-CM | POA: Diagnosis not present

## 2015-12-15 DIAGNOSIS — L814 Other melanin hyperpigmentation: Secondary | ICD-10-CM | POA: Diagnosis not present

## 2015-12-15 DIAGNOSIS — L821 Other seborrheic keratosis: Secondary | ICD-10-CM | POA: Diagnosis not present

## 2015-12-15 DIAGNOSIS — L738 Other specified follicular disorders: Secondary | ICD-10-CM | POA: Diagnosis not present

## 2015-12-15 DIAGNOSIS — D2239 Melanocytic nevi of other parts of face: Secondary | ICD-10-CM | POA: Diagnosis not present

## 2015-12-15 DIAGNOSIS — D224 Melanocytic nevi of scalp and neck: Secondary | ICD-10-CM | POA: Diagnosis not present

## 2016-01-02 DIAGNOSIS — M5441 Lumbago with sciatica, right side: Secondary | ICD-10-CM | POA: Diagnosis not present

## 2016-01-02 DIAGNOSIS — M5137 Other intervertebral disc degeneration, lumbosacral region: Secondary | ICD-10-CM | POA: Diagnosis not present

## 2016-01-02 DIAGNOSIS — M9904 Segmental and somatic dysfunction of sacral region: Secondary | ICD-10-CM | POA: Diagnosis not present

## 2016-01-02 DIAGNOSIS — M9903 Segmental and somatic dysfunction of lumbar region: Secondary | ICD-10-CM | POA: Diagnosis not present

## 2016-01-02 DIAGNOSIS — M791 Myalgia: Secondary | ICD-10-CM | POA: Diagnosis not present

## 2016-01-17 DIAGNOSIS — Z7901 Long term (current) use of anticoagulants: Secondary | ICD-10-CM | POA: Diagnosis not present

## 2016-01-17 DIAGNOSIS — J028 Acute pharyngitis due to other specified organisms: Secondary | ICD-10-CM | POA: Diagnosis not present

## 2016-01-18 DIAGNOSIS — E119 Type 2 diabetes mellitus without complications: Secondary | ICD-10-CM | POA: Diagnosis not present

## 2016-01-25 DIAGNOSIS — M9903 Segmental and somatic dysfunction of lumbar region: Secondary | ICD-10-CM | POA: Diagnosis not present

## 2016-01-25 DIAGNOSIS — M5137 Other intervertebral disc degeneration, lumbosacral region: Secondary | ICD-10-CM | POA: Diagnosis not present

## 2016-01-25 DIAGNOSIS — M5441 Lumbago with sciatica, right side: Secondary | ICD-10-CM | POA: Diagnosis not present

## 2016-01-25 DIAGNOSIS — M791 Myalgia: Secondary | ICD-10-CM | POA: Diagnosis not present

## 2016-01-25 DIAGNOSIS — M9904 Segmental and somatic dysfunction of sacral region: Secondary | ICD-10-CM | POA: Diagnosis not present

## 2016-02-19 DIAGNOSIS — M9903 Segmental and somatic dysfunction of lumbar region: Secondary | ICD-10-CM | POA: Diagnosis not present

## 2016-02-19 DIAGNOSIS — M5137 Other intervertebral disc degeneration, lumbosacral region: Secondary | ICD-10-CM | POA: Diagnosis not present

## 2016-02-19 DIAGNOSIS — M9904 Segmental and somatic dysfunction of sacral region: Secondary | ICD-10-CM | POA: Diagnosis not present

## 2016-02-19 DIAGNOSIS — M791 Myalgia: Secondary | ICD-10-CM | POA: Diagnosis not present

## 2016-02-19 DIAGNOSIS — M5441 Lumbago with sciatica, right side: Secondary | ICD-10-CM | POA: Diagnosis not present

## 2016-02-20 DIAGNOSIS — Z7901 Long term (current) use of anticoagulants: Secondary | ICD-10-CM | POA: Diagnosis not present

## 2016-02-20 DIAGNOSIS — R8299 Other abnormal findings in urine: Secondary | ICD-10-CM | POA: Diagnosis not present

## 2016-02-20 DIAGNOSIS — N390 Urinary tract infection, site not specified: Secondary | ICD-10-CM | POA: Diagnosis not present

## 2016-02-22 DIAGNOSIS — Z1231 Encounter for screening mammogram for malignant neoplasm of breast: Secondary | ICD-10-CM | POA: Diagnosis not present

## 2016-02-29 DIAGNOSIS — M5441 Lumbago with sciatica, right side: Secondary | ICD-10-CM | POA: Diagnosis not present

## 2016-02-29 DIAGNOSIS — M9903 Segmental and somatic dysfunction of lumbar region: Secondary | ICD-10-CM | POA: Diagnosis not present

## 2016-02-29 DIAGNOSIS — M791 Myalgia: Secondary | ICD-10-CM | POA: Diagnosis not present

## 2016-02-29 DIAGNOSIS — M5137 Other intervertebral disc degeneration, lumbosacral region: Secondary | ICD-10-CM | POA: Diagnosis not present

## 2016-02-29 DIAGNOSIS — M9904 Segmental and somatic dysfunction of sacral region: Secondary | ICD-10-CM | POA: Diagnosis not present

## 2016-03-08 DIAGNOSIS — R8299 Other abnormal findings in urine: Secondary | ICD-10-CM | POA: Diagnosis not present

## 2016-03-08 DIAGNOSIS — N390 Urinary tract infection, site not specified: Secondary | ICD-10-CM | POA: Diagnosis not present

## 2016-03-08 DIAGNOSIS — Z7901 Long term (current) use of anticoagulants: Secondary | ICD-10-CM | POA: Diagnosis not present

## 2016-03-21 DIAGNOSIS — M791 Myalgia: Secondary | ICD-10-CM | POA: Diagnosis not present

## 2016-03-21 DIAGNOSIS — M9904 Segmental and somatic dysfunction of sacral region: Secondary | ICD-10-CM | POA: Diagnosis not present

## 2016-03-21 DIAGNOSIS — M5441 Lumbago with sciatica, right side: Secondary | ICD-10-CM | POA: Diagnosis not present

## 2016-03-21 DIAGNOSIS — M5137 Other intervertebral disc degeneration, lumbosacral region: Secondary | ICD-10-CM | POA: Diagnosis not present

## 2016-03-21 DIAGNOSIS — M9903 Segmental and somatic dysfunction of lumbar region: Secondary | ICD-10-CM | POA: Diagnosis not present

## 2016-04-02 DIAGNOSIS — Z7901 Long term (current) use of anticoagulants: Secondary | ICD-10-CM | POA: Diagnosis not present

## 2016-04-11 DIAGNOSIS — M5137 Other intervertebral disc degeneration, lumbosacral region: Secondary | ICD-10-CM | POA: Diagnosis not present

## 2016-04-11 DIAGNOSIS — M9903 Segmental and somatic dysfunction of lumbar region: Secondary | ICD-10-CM | POA: Diagnosis not present

## 2016-04-11 DIAGNOSIS — M5441 Lumbago with sciatica, right side: Secondary | ICD-10-CM | POA: Diagnosis not present

## 2016-04-11 DIAGNOSIS — M9904 Segmental and somatic dysfunction of sacral region: Secondary | ICD-10-CM | POA: Diagnosis not present

## 2016-04-11 DIAGNOSIS — M791 Myalgia: Secondary | ICD-10-CM | POA: Diagnosis not present

## 2016-04-23 DIAGNOSIS — Z7901 Long term (current) use of anticoagulants: Secondary | ICD-10-CM | POA: Diagnosis not present

## 2016-05-02 DIAGNOSIS — M9904 Segmental and somatic dysfunction of sacral region: Secondary | ICD-10-CM | POA: Diagnosis not present

## 2016-05-02 DIAGNOSIS — M5441 Lumbago with sciatica, right side: Secondary | ICD-10-CM | POA: Diagnosis not present

## 2016-05-02 DIAGNOSIS — M791 Myalgia: Secondary | ICD-10-CM | POA: Diagnosis not present

## 2016-05-02 DIAGNOSIS — M9903 Segmental and somatic dysfunction of lumbar region: Secondary | ICD-10-CM | POA: Diagnosis not present

## 2016-05-02 DIAGNOSIS — M5137 Other intervertebral disc degeneration, lumbosacral region: Secondary | ICD-10-CM | POA: Diagnosis not present

## 2016-05-28 DIAGNOSIS — M791 Myalgia: Secondary | ICD-10-CM | POA: Diagnosis not present

## 2016-05-28 DIAGNOSIS — M9904 Segmental and somatic dysfunction of sacral region: Secondary | ICD-10-CM | POA: Diagnosis not present

## 2016-05-28 DIAGNOSIS — M5137 Other intervertebral disc degeneration, lumbosacral region: Secondary | ICD-10-CM | POA: Diagnosis not present

## 2016-05-28 DIAGNOSIS — M5441 Lumbago with sciatica, right side: Secondary | ICD-10-CM | POA: Diagnosis not present

## 2016-05-28 DIAGNOSIS — M9903 Segmental and somatic dysfunction of lumbar region: Secondary | ICD-10-CM | POA: Diagnosis not present

## 2016-06-10 DIAGNOSIS — Z7901 Long term (current) use of anticoagulants: Secondary | ICD-10-CM | POA: Diagnosis not present

## 2016-06-10 DIAGNOSIS — Z23 Encounter for immunization: Secondary | ICD-10-CM | POA: Diagnosis not present

## 2016-06-18 DIAGNOSIS — M791 Myalgia: Secondary | ICD-10-CM | POA: Diagnosis not present

## 2016-06-18 DIAGNOSIS — M9904 Segmental and somatic dysfunction of sacral region: Secondary | ICD-10-CM | POA: Diagnosis not present

## 2016-06-18 DIAGNOSIS — M5441 Lumbago with sciatica, right side: Secondary | ICD-10-CM | POA: Diagnosis not present

## 2016-06-18 DIAGNOSIS — M9903 Segmental and somatic dysfunction of lumbar region: Secondary | ICD-10-CM | POA: Diagnosis not present

## 2016-06-18 DIAGNOSIS — M5137 Other intervertebral disc degeneration, lumbosacral region: Secondary | ICD-10-CM | POA: Diagnosis not present

## 2016-06-27 DIAGNOSIS — Z7901 Long term (current) use of anticoagulants: Secondary | ICD-10-CM | POA: Diagnosis not present

## 2016-07-09 DIAGNOSIS — M9904 Segmental and somatic dysfunction of sacral region: Secondary | ICD-10-CM | POA: Diagnosis not present

## 2016-07-09 DIAGNOSIS — M791 Myalgia: Secondary | ICD-10-CM | POA: Diagnosis not present

## 2016-07-09 DIAGNOSIS — M5441 Lumbago with sciatica, right side: Secondary | ICD-10-CM | POA: Diagnosis not present

## 2016-07-09 DIAGNOSIS — M5137 Other intervertebral disc degeneration, lumbosacral region: Secondary | ICD-10-CM | POA: Diagnosis not present

## 2016-07-09 DIAGNOSIS — M9903 Segmental and somatic dysfunction of lumbar region: Secondary | ICD-10-CM | POA: Diagnosis not present

## 2016-07-30 DIAGNOSIS — M791 Myalgia: Secondary | ICD-10-CM | POA: Diagnosis not present

## 2016-07-30 DIAGNOSIS — M9904 Segmental and somatic dysfunction of sacral region: Secondary | ICD-10-CM | POA: Diagnosis not present

## 2016-07-30 DIAGNOSIS — M9903 Segmental and somatic dysfunction of lumbar region: Secondary | ICD-10-CM | POA: Diagnosis not present

## 2016-07-30 DIAGNOSIS — M5441 Lumbago with sciatica, right side: Secondary | ICD-10-CM | POA: Diagnosis not present

## 2016-07-30 DIAGNOSIS — M5137 Other intervertebral disc degeneration, lumbosacral region: Secondary | ICD-10-CM | POA: Diagnosis not present

## 2016-08-12 DIAGNOSIS — Z7901 Long term (current) use of anticoagulants: Secondary | ICD-10-CM | POA: Diagnosis not present

## 2016-08-19 DIAGNOSIS — Z7901 Long term (current) use of anticoagulants: Secondary | ICD-10-CM | POA: Diagnosis not present

## 2016-08-22 DIAGNOSIS — M9903 Segmental and somatic dysfunction of lumbar region: Secondary | ICD-10-CM | POA: Diagnosis not present

## 2016-08-22 DIAGNOSIS — M791 Myalgia: Secondary | ICD-10-CM | POA: Diagnosis not present

## 2016-08-22 DIAGNOSIS — M5137 Other intervertebral disc degeneration, lumbosacral region: Secondary | ICD-10-CM | POA: Diagnosis not present

## 2016-08-22 DIAGNOSIS — M5441 Lumbago with sciatica, right side: Secondary | ICD-10-CM | POA: Diagnosis not present

## 2016-08-22 DIAGNOSIS — M9904 Segmental and somatic dysfunction of sacral region: Secondary | ICD-10-CM | POA: Diagnosis not present

## 2016-09-02 DIAGNOSIS — Z7901 Long term (current) use of anticoagulants: Secondary | ICD-10-CM | POA: Diagnosis not present

## 2016-09-10 DIAGNOSIS — M5137 Other intervertebral disc degeneration, lumbosacral region: Secondary | ICD-10-CM | POA: Diagnosis not present

## 2016-09-10 DIAGNOSIS — M791 Myalgia: Secondary | ICD-10-CM | POA: Diagnosis not present

## 2016-09-10 DIAGNOSIS — M9904 Segmental and somatic dysfunction of sacral region: Secondary | ICD-10-CM | POA: Diagnosis not present

## 2016-09-10 DIAGNOSIS — M9903 Segmental and somatic dysfunction of lumbar region: Secondary | ICD-10-CM | POA: Diagnosis not present

## 2016-09-10 DIAGNOSIS — M5441 Lumbago with sciatica, right side: Secondary | ICD-10-CM | POA: Diagnosis not present

## 2016-09-19 DIAGNOSIS — R262 Difficulty in walking, not elsewhere classified: Secondary | ICD-10-CM | POA: Diagnosis not present

## 2016-10-01 DIAGNOSIS — M9904 Segmental and somatic dysfunction of sacral region: Secondary | ICD-10-CM | POA: Diagnosis not present

## 2016-10-01 DIAGNOSIS — M5137 Other intervertebral disc degeneration, lumbosacral region: Secondary | ICD-10-CM | POA: Diagnosis not present

## 2016-10-01 DIAGNOSIS — M5441 Lumbago with sciatica, right side: Secondary | ICD-10-CM | POA: Diagnosis not present

## 2016-10-01 DIAGNOSIS — M791 Myalgia: Secondary | ICD-10-CM | POA: Diagnosis not present

## 2016-10-01 DIAGNOSIS — M9903 Segmental and somatic dysfunction of lumbar region: Secondary | ICD-10-CM | POA: Diagnosis not present

## 2016-10-17 DIAGNOSIS — Z7901 Long term (current) use of anticoagulants: Secondary | ICD-10-CM | POA: Diagnosis not present

## 2016-10-22 DIAGNOSIS — M9904 Segmental and somatic dysfunction of sacral region: Secondary | ICD-10-CM | POA: Diagnosis not present

## 2016-10-22 DIAGNOSIS — M9903 Segmental and somatic dysfunction of lumbar region: Secondary | ICD-10-CM | POA: Diagnosis not present

## 2016-10-22 DIAGNOSIS — M5137 Other intervertebral disc degeneration, lumbosacral region: Secondary | ICD-10-CM | POA: Diagnosis not present

## 2016-10-22 DIAGNOSIS — M791 Myalgia: Secondary | ICD-10-CM | POA: Diagnosis not present

## 2016-10-22 DIAGNOSIS — M5441 Lumbago with sciatica, right side: Secondary | ICD-10-CM | POA: Diagnosis not present

## 2016-11-14 DIAGNOSIS — M5441 Lumbago with sciatica, right side: Secondary | ICD-10-CM | POA: Diagnosis not present

## 2016-11-14 DIAGNOSIS — M5137 Other intervertebral disc degeneration, lumbosacral region: Secondary | ICD-10-CM | POA: Diagnosis not present

## 2016-11-14 DIAGNOSIS — M9904 Segmental and somatic dysfunction of sacral region: Secondary | ICD-10-CM | POA: Diagnosis not present

## 2016-11-14 DIAGNOSIS — M791 Myalgia: Secondary | ICD-10-CM | POA: Diagnosis not present

## 2016-11-14 DIAGNOSIS — M9903 Segmental and somatic dysfunction of lumbar region: Secondary | ICD-10-CM | POA: Diagnosis not present

## 2016-12-13 DIAGNOSIS — I10 Essential (primary) hypertension: Secondary | ICD-10-CM | POA: Diagnosis not present

## 2016-12-13 DIAGNOSIS — R8299 Other abnormal findings in urine: Secondary | ICD-10-CM | POA: Diagnosis not present

## 2016-12-13 DIAGNOSIS — N390 Urinary tract infection, site not specified: Secondary | ICD-10-CM | POA: Diagnosis not present

## 2016-12-13 DIAGNOSIS — E784 Other hyperlipidemia: Secondary | ICD-10-CM | POA: Diagnosis not present

## 2016-12-13 DIAGNOSIS — M859 Disorder of bone density and structure, unspecified: Secondary | ICD-10-CM | POA: Diagnosis not present

## 2016-12-13 DIAGNOSIS — R7301 Impaired fasting glucose: Secondary | ICD-10-CM | POA: Diagnosis not present

## 2016-12-13 DIAGNOSIS — Z7901 Long term (current) use of anticoagulants: Secondary | ICD-10-CM | POA: Diagnosis not present

## 2016-12-17 DIAGNOSIS — D224 Melanocytic nevi of scalp and neck: Secondary | ICD-10-CM | POA: Diagnosis not present

## 2016-12-17 DIAGNOSIS — Z85828 Personal history of other malignant neoplasm of skin: Secondary | ICD-10-CM | POA: Diagnosis not present

## 2016-12-17 DIAGNOSIS — L821 Other seborrheic keratosis: Secondary | ICD-10-CM | POA: Diagnosis not present

## 2016-12-17 DIAGNOSIS — L918 Other hypertrophic disorders of the skin: Secondary | ICD-10-CM | POA: Diagnosis not present

## 2016-12-17 DIAGNOSIS — L720 Epidermal cyst: Secondary | ICD-10-CM | POA: Diagnosis not present

## 2016-12-17 DIAGNOSIS — D2371 Other benign neoplasm of skin of right lower limb, including hip: Secondary | ICD-10-CM | POA: Diagnosis not present

## 2016-12-17 DIAGNOSIS — D2262 Melanocytic nevi of left upper limb, including shoulder: Secondary | ICD-10-CM | POA: Diagnosis not present

## 2016-12-17 DIAGNOSIS — D225 Melanocytic nevi of trunk: Secondary | ICD-10-CM | POA: Diagnosis not present

## 2016-12-17 DIAGNOSIS — L738 Other specified follicular disorders: Secondary | ICD-10-CM | POA: Diagnosis not present

## 2016-12-20 DIAGNOSIS — M545 Low back pain: Secondary | ICD-10-CM | POA: Diagnosis not present

## 2016-12-20 DIAGNOSIS — E784 Other hyperlipidemia: Secondary | ICD-10-CM | POA: Diagnosis not present

## 2016-12-20 DIAGNOSIS — Z0000 Encounter for general adult medical examination without abnormal findings: Secondary | ICD-10-CM | POA: Diagnosis not present

## 2016-12-20 DIAGNOSIS — R7301 Impaired fasting glucose: Secondary | ICD-10-CM | POA: Diagnosis not present

## 2016-12-20 DIAGNOSIS — Z1389 Encounter for screening for other disorder: Secondary | ICD-10-CM | POA: Diagnosis not present

## 2016-12-20 DIAGNOSIS — D689 Coagulation defect, unspecified: Secondary | ICD-10-CM | POA: Diagnosis not present

## 2016-12-20 DIAGNOSIS — M859 Disorder of bone density and structure, unspecified: Secondary | ICD-10-CM | POA: Diagnosis not present

## 2016-12-20 DIAGNOSIS — Z6828 Body mass index (BMI) 28.0-28.9, adult: Secondary | ICD-10-CM | POA: Diagnosis not present

## 2016-12-20 DIAGNOSIS — B029 Zoster without complications: Secondary | ICD-10-CM | POA: Diagnosis not present

## 2016-12-20 DIAGNOSIS — I69959 Hemiplegia and hemiparesis following unspecified cerebrovascular disease affecting unspecified side: Secondary | ICD-10-CM | POA: Diagnosis not present

## 2016-12-20 DIAGNOSIS — Z7901 Long term (current) use of anticoagulants: Secondary | ICD-10-CM | POA: Diagnosis not present

## 2016-12-20 DIAGNOSIS — E668 Other obesity: Secondary | ICD-10-CM | POA: Diagnosis not present

## 2016-12-26 DIAGNOSIS — Z1212 Encounter for screening for malignant neoplasm of rectum: Secondary | ICD-10-CM | POA: Diagnosis not present

## 2016-12-31 DIAGNOSIS — M5441 Lumbago with sciatica, right side: Secondary | ICD-10-CM | POA: Diagnosis not present

## 2016-12-31 DIAGNOSIS — M791 Myalgia: Secondary | ICD-10-CM | POA: Diagnosis not present

## 2016-12-31 DIAGNOSIS — M5137 Other intervertebral disc degeneration, lumbosacral region: Secondary | ICD-10-CM | POA: Diagnosis not present

## 2016-12-31 DIAGNOSIS — M9903 Segmental and somatic dysfunction of lumbar region: Secondary | ICD-10-CM | POA: Diagnosis not present

## 2016-12-31 DIAGNOSIS — M9904 Segmental and somatic dysfunction of sacral region: Secondary | ICD-10-CM | POA: Diagnosis not present

## 2017-01-13 ENCOUNTER — Other Ambulatory Visit: Payer: Self-pay | Admitting: Internal Medicine

## 2017-01-13 DIAGNOSIS — M545 Low back pain, unspecified: Secondary | ICD-10-CM

## 2017-01-21 DIAGNOSIS — M5137 Other intervertebral disc degeneration, lumbosacral region: Secondary | ICD-10-CM | POA: Diagnosis not present

## 2017-01-21 DIAGNOSIS — M9904 Segmental and somatic dysfunction of sacral region: Secondary | ICD-10-CM | POA: Diagnosis not present

## 2017-01-21 DIAGNOSIS — M9903 Segmental and somatic dysfunction of lumbar region: Secondary | ICD-10-CM | POA: Diagnosis not present

## 2017-01-21 DIAGNOSIS — M791 Myalgia: Secondary | ICD-10-CM | POA: Diagnosis not present

## 2017-01-21 DIAGNOSIS — M5441 Lumbago with sciatica, right side: Secondary | ICD-10-CM | POA: Diagnosis not present

## 2017-01-27 ENCOUNTER — Ambulatory Visit
Admission: RE | Admit: 2017-01-27 | Discharge: 2017-01-27 | Disposition: A | Discharge status: Home / Self Care | Payer: Medicare Other | Source: Ambulatory Visit | Attending: Internal Medicine | Admitting: Internal Medicine

## 2017-01-27 DIAGNOSIS — M545 Low back pain, unspecified: Secondary | ICD-10-CM

## 2017-01-27 DIAGNOSIS — M48061 Spinal stenosis, lumbar region without neurogenic claudication: Secondary | ICD-10-CM | POA: Diagnosis not present

## 2017-02-03 DIAGNOSIS — Z7901 Long term (current) use of anticoagulants: Secondary | ICD-10-CM | POA: Diagnosis not present

## 2017-02-13 DIAGNOSIS — M5441 Lumbago with sciatica, right side: Secondary | ICD-10-CM | POA: Diagnosis not present

## 2017-02-13 DIAGNOSIS — M791 Myalgia: Secondary | ICD-10-CM | POA: Diagnosis not present

## 2017-02-13 DIAGNOSIS — M5137 Other intervertebral disc degeneration, lumbosacral region: Secondary | ICD-10-CM | POA: Diagnosis not present

## 2017-02-13 DIAGNOSIS — M9904 Segmental and somatic dysfunction of sacral region: Secondary | ICD-10-CM | POA: Diagnosis not present

## 2017-02-13 DIAGNOSIS — M9903 Segmental and somatic dysfunction of lumbar region: Secondary | ICD-10-CM | POA: Diagnosis not present

## 2017-02-20 DIAGNOSIS — M9904 Segmental and somatic dysfunction of sacral region: Secondary | ICD-10-CM | POA: Diagnosis not present

## 2017-02-20 DIAGNOSIS — M791 Myalgia: Secondary | ICD-10-CM | POA: Diagnosis not present

## 2017-02-20 DIAGNOSIS — M5137 Other intervertebral disc degeneration, lumbosacral region: Secondary | ICD-10-CM | POA: Diagnosis not present

## 2017-02-20 DIAGNOSIS — M5441 Lumbago with sciatica, right side: Secondary | ICD-10-CM | POA: Diagnosis not present

## 2017-02-20 DIAGNOSIS — M9903 Segmental and somatic dysfunction of lumbar region: Secondary | ICD-10-CM | POA: Diagnosis not present

## 2017-02-25 DIAGNOSIS — M545 Low back pain: Secondary | ICD-10-CM | POA: Diagnosis not present

## 2017-02-27 DIAGNOSIS — M545 Low back pain: Secondary | ICD-10-CM | POA: Diagnosis not present

## 2017-03-03 DIAGNOSIS — M545 Low back pain: Secondary | ICD-10-CM | POA: Diagnosis not present

## 2017-03-04 DIAGNOSIS — D688 Other specified coagulation defects: Secondary | ICD-10-CM | POA: Diagnosis not present

## 2017-03-04 DIAGNOSIS — Z7901 Long term (current) use of anticoagulants: Secondary | ICD-10-CM | POA: Diagnosis not present

## 2017-03-06 DIAGNOSIS — M545 Low back pain: Secondary | ICD-10-CM | POA: Diagnosis not present

## 2017-03-11 DIAGNOSIS — M545 Low back pain: Secondary | ICD-10-CM | POA: Diagnosis not present

## 2017-03-13 DIAGNOSIS — M545 Low back pain: Secondary | ICD-10-CM | POA: Diagnosis not present

## 2017-03-18 DIAGNOSIS — M545 Low back pain: Secondary | ICD-10-CM | POA: Diagnosis not present

## 2017-03-19 DIAGNOSIS — M9903 Segmental and somatic dysfunction of lumbar region: Secondary | ICD-10-CM | POA: Diagnosis not present

## 2017-03-19 DIAGNOSIS — M9904 Segmental and somatic dysfunction of sacral region: Secondary | ICD-10-CM | POA: Diagnosis not present

## 2017-03-19 DIAGNOSIS — M5137 Other intervertebral disc degeneration, lumbosacral region: Secondary | ICD-10-CM | POA: Diagnosis not present

## 2017-03-19 DIAGNOSIS — M791 Myalgia: Secondary | ICD-10-CM | POA: Diagnosis not present

## 2017-03-19 DIAGNOSIS — M5441 Lumbago with sciatica, right side: Secondary | ICD-10-CM | POA: Diagnosis not present

## 2017-03-20 DIAGNOSIS — M545 Low back pain: Secondary | ICD-10-CM | POA: Diagnosis not present

## 2017-03-25 DIAGNOSIS — M545 Low back pain: Secondary | ICD-10-CM | POA: Diagnosis not present

## 2017-03-26 DIAGNOSIS — Z7901 Long term (current) use of anticoagulants: Secondary | ICD-10-CM | POA: Diagnosis not present

## 2017-03-28 DIAGNOSIS — M545 Low back pain: Secondary | ICD-10-CM | POA: Diagnosis not present

## 2017-04-01 DIAGNOSIS — M545 Low back pain: Secondary | ICD-10-CM | POA: Diagnosis not present

## 2017-04-03 DIAGNOSIS — M545 Low back pain: Secondary | ICD-10-CM | POA: Diagnosis not present

## 2017-04-08 DIAGNOSIS — M545 Low back pain: Secondary | ICD-10-CM | POA: Diagnosis not present

## 2017-04-09 DIAGNOSIS — M5441 Lumbago with sciatica, right side: Secondary | ICD-10-CM | POA: Diagnosis not present

## 2017-04-09 DIAGNOSIS — M9903 Segmental and somatic dysfunction of lumbar region: Secondary | ICD-10-CM | POA: Diagnosis not present

## 2017-04-09 DIAGNOSIS — M791 Myalgia: Secondary | ICD-10-CM | POA: Diagnosis not present

## 2017-04-09 DIAGNOSIS — M5137 Other intervertebral disc degeneration, lumbosacral region: Secondary | ICD-10-CM | POA: Diagnosis not present

## 2017-04-09 DIAGNOSIS — M9904 Segmental and somatic dysfunction of sacral region: Secondary | ICD-10-CM | POA: Diagnosis not present

## 2017-04-10 DIAGNOSIS — M545 Low back pain: Secondary | ICD-10-CM | POA: Diagnosis not present

## 2017-04-15 DIAGNOSIS — M545 Low back pain: Secondary | ICD-10-CM | POA: Diagnosis not present

## 2017-04-22 DIAGNOSIS — M545 Low back pain: Secondary | ICD-10-CM | POA: Diagnosis not present

## 2017-04-23 DIAGNOSIS — M5137 Other intervertebral disc degeneration, lumbosacral region: Secondary | ICD-10-CM | POA: Diagnosis not present

## 2017-04-23 DIAGNOSIS — M9903 Segmental and somatic dysfunction of lumbar region: Secondary | ICD-10-CM | POA: Diagnosis not present

## 2017-04-23 DIAGNOSIS — M5441 Lumbago with sciatica, right side: Secondary | ICD-10-CM | POA: Diagnosis not present

## 2017-04-23 DIAGNOSIS — M791 Myalgia: Secondary | ICD-10-CM | POA: Diagnosis not present

## 2017-04-23 DIAGNOSIS — M9904 Segmental and somatic dysfunction of sacral region: Secondary | ICD-10-CM | POA: Diagnosis not present

## 2017-04-29 DIAGNOSIS — M545 Low back pain: Secondary | ICD-10-CM | POA: Diagnosis not present

## 2017-05-01 DIAGNOSIS — M545 Low back pain: Secondary | ICD-10-CM | POA: Diagnosis not present

## 2017-05-06 DIAGNOSIS — M545 Low back pain: Secondary | ICD-10-CM | POA: Diagnosis not present

## 2017-05-08 DIAGNOSIS — M545 Low back pain: Secondary | ICD-10-CM | POA: Diagnosis not present

## 2017-05-14 DIAGNOSIS — M791 Myalgia: Secondary | ICD-10-CM | POA: Diagnosis not present

## 2017-05-14 DIAGNOSIS — Z7901 Long term (current) use of anticoagulants: Secondary | ICD-10-CM | POA: Diagnosis not present

## 2017-05-14 DIAGNOSIS — M9903 Segmental and somatic dysfunction of lumbar region: Secondary | ICD-10-CM | POA: Diagnosis not present

## 2017-05-14 DIAGNOSIS — M5441 Lumbago with sciatica, right side: Secondary | ICD-10-CM | POA: Diagnosis not present

## 2017-05-14 DIAGNOSIS — M9904 Segmental and somatic dysfunction of sacral region: Secondary | ICD-10-CM | POA: Diagnosis not present

## 2017-05-14 DIAGNOSIS — M5137 Other intervertebral disc degeneration, lumbosacral region: Secondary | ICD-10-CM | POA: Diagnosis not present

## 2017-06-04 DIAGNOSIS — M5137 Other intervertebral disc degeneration, lumbosacral region: Secondary | ICD-10-CM | POA: Diagnosis not present

## 2017-06-04 DIAGNOSIS — M791 Myalgia: Secondary | ICD-10-CM | POA: Diagnosis not present

## 2017-06-04 DIAGNOSIS — M9903 Segmental and somatic dysfunction of lumbar region: Secondary | ICD-10-CM | POA: Diagnosis not present

## 2017-06-04 DIAGNOSIS — M9904 Segmental and somatic dysfunction of sacral region: Secondary | ICD-10-CM | POA: Diagnosis not present

## 2017-06-04 DIAGNOSIS — M5441 Lumbago with sciatica, right side: Secondary | ICD-10-CM | POA: Diagnosis not present

## 2017-06-23 DIAGNOSIS — M545 Low back pain: Secondary | ICD-10-CM | POA: Diagnosis not present

## 2017-06-23 DIAGNOSIS — I10 Essential (primary) hypertension: Secondary | ICD-10-CM | POA: Diagnosis not present

## 2017-06-23 DIAGNOSIS — N390 Urinary tract infection, site not specified: Secondary | ICD-10-CM | POA: Diagnosis not present

## 2017-06-23 DIAGNOSIS — Z6828 Body mass index (BMI) 28.0-28.9, adult: Secondary | ICD-10-CM | POA: Diagnosis not present

## 2017-06-23 DIAGNOSIS — D689 Coagulation defect, unspecified: Secondary | ICD-10-CM | POA: Diagnosis not present

## 2017-06-23 DIAGNOSIS — R7301 Impaired fasting glucose: Secondary | ICD-10-CM | POA: Diagnosis not present

## 2017-06-23 DIAGNOSIS — Z23 Encounter for immunization: Secondary | ICD-10-CM | POA: Diagnosis not present

## 2017-06-23 DIAGNOSIS — R2689 Other abnormalities of gait and mobility: Secondary | ICD-10-CM | POA: Diagnosis not present

## 2017-06-23 DIAGNOSIS — I69959 Hemiplegia and hemiparesis following unspecified cerebrovascular disease affecting unspecified side: Secondary | ICD-10-CM | POA: Diagnosis not present

## 2017-07-02 DIAGNOSIS — M5441 Lumbago with sciatica, right side: Secondary | ICD-10-CM | POA: Diagnosis not present

## 2017-07-02 DIAGNOSIS — M791 Myalgia: Secondary | ICD-10-CM | POA: Diagnosis not present

## 2017-07-02 DIAGNOSIS — M9904 Segmental and somatic dysfunction of sacral region: Secondary | ICD-10-CM | POA: Diagnosis not present

## 2017-07-02 DIAGNOSIS — M9903 Segmental and somatic dysfunction of lumbar region: Secondary | ICD-10-CM | POA: Diagnosis not present

## 2017-07-02 DIAGNOSIS — M5137 Other intervertebral disc degeneration, lumbosacral region: Secondary | ICD-10-CM | POA: Diagnosis not present

## 2017-07-14 DIAGNOSIS — Z7901 Long term (current) use of anticoagulants: Secondary | ICD-10-CM | POA: Diagnosis not present

## 2017-07-14 DIAGNOSIS — N390 Urinary tract infection, site not specified: Secondary | ICD-10-CM | POA: Diagnosis not present

## 2017-07-14 DIAGNOSIS — R8290 Unspecified abnormal findings in urine: Secondary | ICD-10-CM | POA: Diagnosis not present

## 2017-07-21 DIAGNOSIS — Z1231 Encounter for screening mammogram for malignant neoplasm of breast: Secondary | ICD-10-CM | POA: Diagnosis not present

## 2017-08-05 DIAGNOSIS — Z7901 Long term (current) use of anticoagulants: Secondary | ICD-10-CM | POA: Diagnosis not present

## 2017-08-05 DIAGNOSIS — N390 Urinary tract infection, site not specified: Secondary | ICD-10-CM | POA: Diagnosis not present

## 2017-08-06 DIAGNOSIS — M5137 Other intervertebral disc degeneration, lumbosacral region: Secondary | ICD-10-CM | POA: Diagnosis not present

## 2017-08-06 DIAGNOSIS — M9904 Segmental and somatic dysfunction of sacral region: Secondary | ICD-10-CM | POA: Diagnosis not present

## 2017-08-06 DIAGNOSIS — M7918 Myalgia, other site: Secondary | ICD-10-CM | POA: Diagnosis not present

## 2017-08-06 DIAGNOSIS — M9903 Segmental and somatic dysfunction of lumbar region: Secondary | ICD-10-CM | POA: Diagnosis not present

## 2017-08-06 DIAGNOSIS — M5441 Lumbago with sciatica, right side: Secondary | ICD-10-CM | POA: Diagnosis not present

## 2017-08-22 DIAGNOSIS — H2513 Age-related nuclear cataract, bilateral: Secondary | ICD-10-CM | POA: Diagnosis not present

## 2017-09-01 DIAGNOSIS — M7918 Myalgia, other site: Secondary | ICD-10-CM | POA: Diagnosis not present

## 2017-09-01 DIAGNOSIS — M5441 Lumbago with sciatica, right side: Secondary | ICD-10-CM | POA: Diagnosis not present

## 2017-09-01 DIAGNOSIS — M9903 Segmental and somatic dysfunction of lumbar region: Secondary | ICD-10-CM | POA: Diagnosis not present

## 2017-09-01 DIAGNOSIS — M5137 Other intervertebral disc degeneration, lumbosacral region: Secondary | ICD-10-CM | POA: Diagnosis not present

## 2017-09-01 DIAGNOSIS — M9904 Segmental and somatic dysfunction of sacral region: Secondary | ICD-10-CM | POA: Diagnosis not present

## 2017-09-12 ENCOUNTER — Ambulatory Visit (INDEPENDENT_AMBULATORY_CARE_PROVIDER_SITE_OTHER): Payer: Medicare Other | Admitting: Obstetrics & Gynecology

## 2017-09-12 ENCOUNTER — Encounter: Payer: Self-pay | Admitting: Obstetrics & Gynecology

## 2017-09-12 VITALS — BP 120/70 | HR 81 | Ht 64.0 in | Wt 167.0 lb

## 2017-09-12 DIAGNOSIS — Z1272 Encounter for screening for malignant neoplasm of vagina: Secondary | ICD-10-CM

## 2017-09-12 DIAGNOSIS — Z0000 Encounter for general adult medical examination without abnormal findings: Secondary | ICD-10-CM

## 2017-09-12 NOTE — Progress Notes (Signed)
HPI:      [name] is a 71 y.o. [id number] who LMP was in the past, she presents today for her annual examination.  The patient has no complaints today. The patient is not sexually active. Herlast pap: approximate date 2015 and was normal and last mammogram: approximate date 07/2017 and was normal.  The patient does perform self breast exams.  There is no notable family history of breast or ovarian cancer in her family. The patient is not taking hormone replacement therapy. Patient denies post-menopausal vaginal bleeding.   The patient has regular exercise: no. The patient denies current symptoms of depression.    GYN Hx: Last Colonoscopy:2 years ago. Normal.  Last DEXA: never ago.    PMHx: Past Medical History:  Diagnosis Date  . Allergic rhinitis   . Clotting disorder ([place])   . Diabetes mellitus without complication ([place])    no meds-diet controlled  . Heart murmur    mild mitral regurg-echo 2010  . Hyperlipidemia   . Hypertension   . Lupus anticoagulant positive   . Obesity   . Stroke [facility]) 2010   Past Surgical History:  Procedure Laterality Date  . ABDOMINAL HYSTERECTOMY  1981  . APPENDECTOMY    . AXILLARY LYMPH NODE BIOPSY Left 05/16/2014   Procedure: EXCISION 2CM LEFT AXILLARY MASS;  Surgeon: [name], MD;  Location: [facility];  Service: General;  Laterality: Left;  Left axillary sebaceous cyst excision  . DILATION AND CURETTAGE OF UTERUS    . SKIN CANCER EXCISION  2016   [facility]   Family History  Problem Relation Age of Onset  . Heart disease Mother   . COPD Father   . Colon cancer Sister   . Clotting disorder Sister   . Diabetes Sister   . Kidney disease Sister   . Heart disease Brother    Social History   Tobacco Use  . Smoking status: Former Smoker    Types: Cigarettes    Last attempt to quit: 03/14/2009    Years since quitting: 8.5  . Smokeless tobacco: Never Used  Substance Use Topics  . Alcohol use: No    Alcohol/week: 0.0  oz  . Drug use: No    Current Outpatient Medications:  .  amLODipine (NORVASC) 5 MG tablet, Take 5 mg by mouth 2 (two) times daily. , Disp: , Rfl:  .  aspirin 81 MG tablet, Take 81 mg by mouth daily., Disp: , Rfl:  .  cholecalciferol (VITAMIN D) 1000 UNITS tablet, Take 2,000 Units by mouth daily., Disp: , Rfl:  .  escitalopram (LEXAPRO) 10 MG tablet, Take 10 mg by mouth daily., Disp: , Rfl: 11 .  ezetimibe (ZETIA) 10 MG tablet, Take 10 mg by mouth every 3 (three) days. Half 3x week, Disp: , Rfl:  .  fluticasone (FLONASE) 50 MCG/ACT nasal spray, USE TWO SPRAYS IN EACH NOSTRIL ONCE DAILY, Disp: , Rfl: 1 .  lisinopril (PRINIVIL,ZESTRIL) 10 MG tablet, Take 5 mg by mouth 2 (two) times daily. , Disp: , Rfl:  .  lisinopril (PRINIVIL,ZESTRIL) 20 MG tablet, Take 10 mg by mouth 2 (two) times daily., Disp: , Rfl: 2 .  nitrofurantoin, macrocrystal-monohydrate, (MACROBID) 100 MG capsule, Take 100 mg by mouth 2 (two) times daily., Disp: , Rfl: 0 .  simvastatin (ZOCOR) 20 MG tablet, Take 20 mg by mouth daily at 6 PM. , Disp: , Rfl:  .  warfarin (COUMADIN) 5 MG tablet, Take 5 mg by mouth daily. Thursday-extra  2.5mg , Disp: , Rfl:  Allergies: Codeine  Review of Systems  Constitutional: Negative for chills, fever and malaise/fatigue.  HENT: Negative for congestion, sinus pain and sore throat.   Eyes: Negative for blurred vision and pain.  Respiratory: Negative for cough and wheezing.   Cardiovascular: Negative for chest pain and leg swelling.  Gastrointestinal: Negative for abdominal pain, constipation, diarrhea, heartburn, nausea and vomiting.  Genitourinary: Negative for dysuria, frequency, hematuria and urgency.  Musculoskeletal: Negative for back pain, joint pain, myalgias and neck pain.  Skin: Negative for itching and rash.  Neurological: Negative for dizziness, tremors and weakness.  Endo/Heme/Allergies: Does not bruise/bleed easily.  Psychiatric/Behavioral: Negative for depression. The patient is  not nervous/anxious and does not have insomnia.     Objective: BP 120/70   Pulse 81   Ht 5\' 4"  (1.626 m)   Wt 167 lb (75.8 kg)   BMI 28.67 kg/m   Filed Weights   09/12/17 1408  Weight: 167 lb (75.8 kg)   Body mass index is 28.67 kg/m. Physical Exam  Constitutional: She is oriented to person, place, and time. She appears well-developed and well-nourished. No distress.  Genitourinary: Rectum normal and vagina normal. Pelvic exam was performed with patient supine. There is no rash or lesion on the right labia. There is no rash or lesion on the left labia. Vagina exhibits no lesion. No bleeding in the vagina. Right adnexum does not display mass and does not display tenderness. Left adnexum does not display mass and does not display tenderness.  Genitourinary Comments: Absent Uterus Absent cervix Vaginal cuff well healed  HENT:  Head: Normocephalic and atraumatic. Head is without laceration.  Right Ear: Hearing normal.  Left Ear: Hearing normal.  Nose: No epistaxis.  No foreign bodies.  Mouth/Throat: Uvula is midline, oropharynx is clear and moist and mucous membranes are normal.  Eyes: Pupils are equal, round, and reactive to light.  Neck: Normal range of motion. Neck supple. No thyromegaly present.  Cardiovascular: Normal rate and regular rhythm. Exam reveals no gallop and no friction rub.  No murmur heard. Pulmonary/Chest: Effort normal and breath sounds normal. No respiratory distress. She has no wheezes. Right breast exhibits no mass, no skin change and no tenderness. Left breast exhibits no mass, no skin change and no tenderness.  Abdominal: Soft. Bowel sounds are normal. She exhibits no distension. There is no tenderness. There is no rebound.  Musculoskeletal: Normal range of motion.  Neurological: She is alert and oriented to person, place, and time. No cranial nerve deficit.  Skin: Skin is warm and dry.  Psychiatric: She has a normal mood and affect. Judgment normal.  Vitals  reviewed.  Assessment: Annual Exam 1. Annual physical exam   2. Screening for vaginal cancer    Plan:            1.  Cervical Screening-  Pap smear done today  2. Breast screening- Exam annually and mammogram scheduled  3. Colonoscopy every 10 years, Hemoccult testing after age 23  4. Labs managed by PCP  5. Counseling for hormonal therapy: none    F/U  Return in about 1 year (around 09/12/2018) for Annual.  Barnett Applebaum, MD, [facility], [facility] 09/12/2017  2:31 PM

## 2017-09-16 DIAGNOSIS — Z7901 Long term (current) use of anticoagulants: Secondary | ICD-10-CM | POA: Diagnosis not present

## 2017-09-16 LAB — PAP IG (IMAGE GUIDED): PAP SMEAR COMMENT: 0

## 2017-09-19 ENCOUNTER — Telehealth: Payer: Self-pay | Admitting: Obstetrics & Gynecology

## 2017-09-19 NOTE — Telephone Encounter (Signed)
Pt is calling to find out about her labs. Please advise

## 2017-10-01 DIAGNOSIS — M7918 Myalgia, other site: Secondary | ICD-10-CM | POA: Diagnosis not present

## 2017-10-01 DIAGNOSIS — M5137 Other intervertebral disc degeneration, lumbosacral region: Secondary | ICD-10-CM | POA: Diagnosis not present

## 2017-10-01 DIAGNOSIS — M5441 Lumbago with sciatica, right side: Secondary | ICD-10-CM | POA: Diagnosis not present

## 2017-10-01 DIAGNOSIS — M9904 Segmental and somatic dysfunction of sacral region: Secondary | ICD-10-CM | POA: Diagnosis not present

## 2017-10-01 DIAGNOSIS — M9903 Segmental and somatic dysfunction of lumbar region: Secondary | ICD-10-CM | POA: Diagnosis not present

## 2017-10-29 DIAGNOSIS — M7918 Myalgia, other site: Secondary | ICD-10-CM | POA: Diagnosis not present

## 2017-10-29 DIAGNOSIS — M5137 Other intervertebral disc degeneration, lumbosacral region: Secondary | ICD-10-CM | POA: Diagnosis not present

## 2017-10-29 DIAGNOSIS — M9903 Segmental and somatic dysfunction of lumbar region: Secondary | ICD-10-CM | POA: Diagnosis not present

## 2017-10-29 DIAGNOSIS — M9904 Segmental and somatic dysfunction of sacral region: Secondary | ICD-10-CM | POA: Diagnosis not present

## 2017-10-29 DIAGNOSIS — M5441 Lumbago with sciatica, right side: Secondary | ICD-10-CM | POA: Diagnosis not present

## 2017-11-06 DIAGNOSIS — Z7901 Long term (current) use of anticoagulants: Secondary | ICD-10-CM | POA: Diagnosis not present

## 2017-11-25 DIAGNOSIS — M9903 Segmental and somatic dysfunction of lumbar region: Secondary | ICD-10-CM | POA: Diagnosis not present

## 2017-11-25 DIAGNOSIS — M5137 Other intervertebral disc degeneration, lumbosacral region: Secondary | ICD-10-CM | POA: Diagnosis not present

## 2017-11-25 DIAGNOSIS — M7918 Myalgia, other site: Secondary | ICD-10-CM | POA: Diagnosis not present

## 2017-11-25 DIAGNOSIS — M9904 Segmental and somatic dysfunction of sacral region: Secondary | ICD-10-CM | POA: Diagnosis not present

## 2017-11-25 DIAGNOSIS — M5441 Lumbago with sciatica, right side: Secondary | ICD-10-CM | POA: Diagnosis not present

## 2017-11-26 DIAGNOSIS — Z794 Long term (current) use of insulin: Secondary | ICD-10-CM | POA: Diagnosis not present

## 2017-11-26 DIAGNOSIS — E11649 Type 2 diabetes mellitus with hypoglycemia without coma: Secondary | ICD-10-CM | POA: Diagnosis not present

## 2017-11-26 DIAGNOSIS — M21371 Foot drop, right foot: Secondary | ICD-10-CM | POA: Diagnosis not present

## 2017-11-26 DIAGNOSIS — R2681 Unsteadiness on feet: Secondary | ICD-10-CM | POA: Diagnosis not present

## 2017-12-24 DIAGNOSIS — M5137 Other intervertebral disc degeneration, lumbosacral region: Secondary | ICD-10-CM | POA: Diagnosis not present

## 2017-12-24 DIAGNOSIS — M5441 Lumbago with sciatica, right side: Secondary | ICD-10-CM | POA: Diagnosis not present

## 2017-12-24 DIAGNOSIS — M9904 Segmental and somatic dysfunction of sacral region: Secondary | ICD-10-CM | POA: Diagnosis not present

## 2017-12-24 DIAGNOSIS — M9903 Segmental and somatic dysfunction of lumbar region: Secondary | ICD-10-CM | POA: Diagnosis not present

## 2017-12-24 DIAGNOSIS — M7918 Myalgia, other site: Secondary | ICD-10-CM | POA: Diagnosis not present

## 2017-12-25 DIAGNOSIS — Z7901 Long term (current) use of anticoagulants: Secondary | ICD-10-CM | POA: Diagnosis not present

## 2017-12-31 DIAGNOSIS — E11649 Type 2 diabetes mellitus with hypoglycemia without coma: Secondary | ICD-10-CM | POA: Diagnosis not present

## 2017-12-31 DIAGNOSIS — M21371 Foot drop, right foot: Secondary | ICD-10-CM | POA: Diagnosis not present

## 2017-12-31 DIAGNOSIS — Z794 Long term (current) use of insulin: Secondary | ICD-10-CM | POA: Diagnosis not present

## 2017-12-31 DIAGNOSIS — R2681 Unsteadiness on feet: Secondary | ICD-10-CM | POA: Diagnosis not present

## 2018-01-21 ENCOUNTER — Other Ambulatory Visit: Payer: Self-pay | Admitting: Orthopedic Surgery

## 2018-01-21 DIAGNOSIS — M5416 Radiculopathy, lumbar region: Secondary | ICD-10-CM

## 2018-01-21 DIAGNOSIS — M545 Low back pain: Secondary | ICD-10-CM

## 2018-01-22 ENCOUNTER — Other Ambulatory Visit: Payer: Self-pay | Admitting: Orthopedic Surgery

## 2018-01-22 DIAGNOSIS — M545 Low back pain: Secondary | ICD-10-CM

## 2018-01-22 DIAGNOSIS — S80219S Abrasion, unspecified knee, sequela: Secondary | ICD-10-CM

## 2018-02-02 DIAGNOSIS — M48061 Spinal stenosis, lumbar region without neurogenic claudication: Secondary | ICD-10-CM | POA: Diagnosis not present

## 2018-02-02 DIAGNOSIS — M545 Low back pain: Secondary | ICD-10-CM | POA: Diagnosis not present

## 2018-02-02 DIAGNOSIS — M5136 Other intervertebral disc degeneration, lumbar region: Secondary | ICD-10-CM | POA: Diagnosis not present

## 2018-02-04 ENCOUNTER — Ambulatory Visit
Admission: RE | Admit: 2018-02-04 | Discharge: 2018-02-04 | Disposition: A | Discharge status: Home / Self Care | Payer: Medicare Other | Source: Ambulatory Visit | Attending: Orthopedic Surgery | Admitting: Orthopedic Surgery

## 2018-02-04 ENCOUNTER — Other Ambulatory Visit: Payer: Self-pay | Admitting: Orthopedic Surgery

## 2018-02-04 DIAGNOSIS — S80219S Abrasion, unspecified knee, sequela: Secondary | ICD-10-CM

## 2018-02-04 DIAGNOSIS — M47816 Spondylosis without myelopathy or radiculopathy, lumbar region: Secondary | ICD-10-CM | POA: Diagnosis not present

## 2018-02-04 DIAGNOSIS — M5127 Other intervertebral disc displacement, lumbosacral region: Secondary | ICD-10-CM | POA: Diagnosis not present

## 2018-02-04 DIAGNOSIS — M5416 Radiculopathy, lumbar region: Secondary | ICD-10-CM | POA: Insufficient documentation

## 2018-02-04 DIAGNOSIS — M1712 Unilateral primary osteoarthritis, left knee: Secondary | ICD-10-CM | POA: Diagnosis not present

## 2018-02-04 DIAGNOSIS — M545 Low back pain: Secondary | ICD-10-CM | POA: Diagnosis not present

## 2018-02-04 DIAGNOSIS — M25562 Pain in left knee: Secondary | ICD-10-CM | POA: Insufficient documentation

## 2018-02-04 DIAGNOSIS — M7989 Other specified soft tissue disorders: Secondary | ICD-10-CM | POA: Diagnosis not present

## 2018-02-04 DIAGNOSIS — M549 Dorsalgia, unspecified: Secondary | ICD-10-CM | POA: Insufficient documentation

## 2018-02-05 DIAGNOSIS — M5136 Other intervertebral disc degeneration, lumbar region: Secondary | ICD-10-CM | POA: Diagnosis not present

## 2018-02-05 DIAGNOSIS — M48061 Spinal stenosis, lumbar region without neurogenic claudication: Secondary | ICD-10-CM | POA: Diagnosis not present

## 2018-02-05 DIAGNOSIS — M545 Low back pain: Secondary | ICD-10-CM | POA: Diagnosis not present

## 2018-02-12 DIAGNOSIS — M545 Low back pain: Secondary | ICD-10-CM | POA: Diagnosis not present

## 2018-02-12 DIAGNOSIS — M48061 Spinal stenosis, lumbar region without neurogenic claudication: Secondary | ICD-10-CM | POA: Diagnosis not present

## 2018-02-12 DIAGNOSIS — M5136 Other intervertebral disc degeneration, lumbar region: Secondary | ICD-10-CM | POA: Diagnosis not present

## 2018-02-17 DIAGNOSIS — Z1389 Encounter for screening for other disorder: Secondary | ICD-10-CM | POA: Diagnosis not present

## 2018-02-17 DIAGNOSIS — R7301 Impaired fasting glucose: Secondary | ICD-10-CM | POA: Diagnosis not present

## 2018-02-17 DIAGNOSIS — M859 Disorder of bone density and structure, unspecified: Secondary | ICD-10-CM | POA: Diagnosis not present

## 2018-02-17 DIAGNOSIS — R82998 Other abnormal findings in urine: Secondary | ICD-10-CM | POA: Diagnosis not present

## 2018-02-17 DIAGNOSIS — H6981 Other specified disorders of Eustachian tube, right ear: Secondary | ICD-10-CM | POA: Diagnosis not present

## 2018-02-17 DIAGNOSIS — Z7901 Long term (current) use of anticoagulants: Secondary | ICD-10-CM | POA: Diagnosis not present

## 2018-02-17 DIAGNOSIS — E7849 Other hyperlipidemia: Secondary | ICD-10-CM | POA: Diagnosis not present

## 2018-02-17 DIAGNOSIS — Z0000 Encounter for general adult medical examination without abnormal findings: Secondary | ICD-10-CM | POA: Diagnosis not present

## 2018-02-17 DIAGNOSIS — M25551 Pain in right hip: Secondary | ICD-10-CM | POA: Diagnosis not present

## 2018-02-17 DIAGNOSIS — M545 Low back pain: Secondary | ICD-10-CM | POA: Diagnosis not present

## 2018-02-17 DIAGNOSIS — M79671 Pain in right foot: Secondary | ICD-10-CM | POA: Diagnosis not present

## 2018-02-17 DIAGNOSIS — Z6828 Body mass index (BMI) 28.0-28.9, adult: Secondary | ICD-10-CM | POA: Diagnosis not present

## 2018-02-17 DIAGNOSIS — I10 Essential (primary) hypertension: Secondary | ICD-10-CM | POA: Diagnosis not present

## 2018-02-17 DIAGNOSIS — I69959 Hemiplegia and hemiparesis following unspecified cerebrovascular disease affecting unspecified side: Secondary | ICD-10-CM | POA: Diagnosis not present

## 2018-03-05 DIAGNOSIS — M545 Low back pain: Secondary | ICD-10-CM | POA: Diagnosis not present

## 2018-03-05 DIAGNOSIS — M1612 Unilateral primary osteoarthritis, left hip: Secondary | ICD-10-CM | POA: Diagnosis not present

## 2018-03-05 DIAGNOSIS — M25552 Pain in left hip: Secondary | ICD-10-CM | POA: Diagnosis not present

## 2018-03-10 DIAGNOSIS — Z85828 Personal history of other malignant neoplasm of skin: Secondary | ICD-10-CM | POA: Diagnosis not present

## 2018-03-10 DIAGNOSIS — L814 Other melanin hyperpigmentation: Secondary | ICD-10-CM | POA: Diagnosis not present

## 2018-03-10 DIAGNOSIS — L821 Other seborrheic keratosis: Secondary | ICD-10-CM | POA: Diagnosis not present

## 2018-03-10 DIAGNOSIS — D2262 Melanocytic nevi of left upper limb, including shoulder: Secondary | ICD-10-CM | POA: Diagnosis not present

## 2018-03-10 DIAGNOSIS — D225 Melanocytic nevi of trunk: Secondary | ICD-10-CM | POA: Diagnosis not present

## 2018-03-10 DIAGNOSIS — D2272 Melanocytic nevi of left lower limb, including hip: Secondary | ICD-10-CM | POA: Diagnosis not present

## 2018-03-10 DIAGNOSIS — D692 Other nonthrombocytopenic purpura: Secondary | ICD-10-CM | POA: Diagnosis not present

## 2018-03-10 DIAGNOSIS — D1801 Hemangioma of skin and subcutaneous tissue: Secondary | ICD-10-CM | POA: Diagnosis not present

## 2018-03-10 DIAGNOSIS — D2371 Other benign neoplasm of skin of right lower limb, including hip: Secondary | ICD-10-CM | POA: Diagnosis not present

## 2018-03-10 DIAGNOSIS — L738 Other specified follicular disorders: Secondary | ICD-10-CM | POA: Diagnosis not present

## 2018-04-07 DIAGNOSIS — M545 Low back pain: Secondary | ICD-10-CM | POA: Diagnosis not present

## 2018-04-07 DIAGNOSIS — Z7901 Long term (current) use of anticoagulants: Secondary | ICD-10-CM | POA: Diagnosis not present

## 2018-04-07 DIAGNOSIS — M48061 Spinal stenosis, lumbar region without neurogenic claudication: Secondary | ICD-10-CM | POA: Diagnosis not present

## 2018-04-07 DIAGNOSIS — M1612 Unilateral primary osteoarthritis, left hip: Secondary | ICD-10-CM | POA: Diagnosis not present

## 2018-04-07 DIAGNOSIS — M25552 Pain in left hip: Secondary | ICD-10-CM | POA: Diagnosis not present

## 2018-05-18 DIAGNOSIS — M545 Low back pain: Secondary | ICD-10-CM | POA: Diagnosis not present

## 2018-05-18 DIAGNOSIS — M48061 Spinal stenosis, lumbar region without neurogenic claudication: Secondary | ICD-10-CM | POA: Diagnosis not present

## 2018-05-25 DIAGNOSIS — D689 Coagulation defect, unspecified: Secondary | ICD-10-CM | POA: Diagnosis not present

## 2018-05-25 DIAGNOSIS — Z7901 Long term (current) use of anticoagulants: Secondary | ICD-10-CM | POA: Diagnosis not present

## 2018-05-25 DIAGNOSIS — I69959 Hemiplegia and hemiparesis following unspecified cerebrovascular disease affecting unspecified side: Secondary | ICD-10-CM | POA: Diagnosis not present

## 2018-05-25 DIAGNOSIS — R2689 Other abnormalities of gait and mobility: Secondary | ICD-10-CM | POA: Diagnosis not present

## 2018-05-25 DIAGNOSIS — M545 Low back pain: Secondary | ICD-10-CM | POA: Diagnosis not present

## 2018-05-31 DIAGNOSIS — M545 Low back pain: Secondary | ICD-10-CM | POA: Diagnosis not present

## 2018-05-31 DIAGNOSIS — Z7982 Long term (current) use of aspirin: Secondary | ICD-10-CM | POA: Diagnosis not present

## 2018-05-31 DIAGNOSIS — Z7901 Long term (current) use of anticoagulants: Secondary | ICD-10-CM | POA: Diagnosis not present

## 2018-05-31 DIAGNOSIS — R2689 Other abnormalities of gait and mobility: Secondary | ICD-10-CM | POA: Diagnosis not present

## 2018-05-31 DIAGNOSIS — I69351 Hemiplegia and hemiparesis following cerebral infarction affecting right dominant side: Secondary | ICD-10-CM | POA: Diagnosis not present

## 2018-05-31 DIAGNOSIS — Z5181 Encounter for therapeutic drug level monitoring: Secondary | ICD-10-CM | POA: Diagnosis not present

## 2018-05-31 DIAGNOSIS — Z9181 History of falling: Secondary | ICD-10-CM | POA: Diagnosis not present

## 2018-06-01 DIAGNOSIS — Z7901 Long term (current) use of anticoagulants: Secondary | ICD-10-CM | POA: Diagnosis not present

## 2018-06-01 DIAGNOSIS — M545 Low back pain: Secondary | ICD-10-CM | POA: Diagnosis not present

## 2018-06-01 DIAGNOSIS — Z5181 Encounter for therapeutic drug level monitoring: Secondary | ICD-10-CM | POA: Diagnosis not present

## 2018-06-01 DIAGNOSIS — R2689 Other abnormalities of gait and mobility: Secondary | ICD-10-CM | POA: Diagnosis not present

## 2018-06-01 DIAGNOSIS — I69351 Hemiplegia and hemiparesis following cerebral infarction affecting right dominant side: Secondary | ICD-10-CM | POA: Diagnosis not present

## 2018-06-01 DIAGNOSIS — Z7982 Long term (current) use of aspirin: Secondary | ICD-10-CM | POA: Diagnosis not present

## 2018-06-08 DIAGNOSIS — Z5181 Encounter for therapeutic drug level monitoring: Secondary | ICD-10-CM | POA: Diagnosis not present

## 2018-06-08 DIAGNOSIS — R2689 Other abnormalities of gait and mobility: Secondary | ICD-10-CM | POA: Diagnosis not present

## 2018-06-08 DIAGNOSIS — M545 Low back pain: Secondary | ICD-10-CM | POA: Diagnosis not present

## 2018-06-08 DIAGNOSIS — Z7982 Long term (current) use of aspirin: Secondary | ICD-10-CM | POA: Diagnosis not present

## 2018-06-08 DIAGNOSIS — I69351 Hemiplegia and hemiparesis following cerebral infarction affecting right dominant side: Secondary | ICD-10-CM | POA: Diagnosis not present

## 2018-06-08 DIAGNOSIS — Z7901 Long term (current) use of anticoagulants: Secondary | ICD-10-CM | POA: Diagnosis not present

## 2018-06-09 DIAGNOSIS — M48061 Spinal stenosis, lumbar region without neurogenic claudication: Secondary | ICD-10-CM | POA: Diagnosis not present

## 2018-06-09 DIAGNOSIS — M1612 Unilateral primary osteoarthritis, left hip: Secondary | ICD-10-CM | POA: Diagnosis not present

## 2018-06-09 DIAGNOSIS — M25552 Pain in left hip: Secondary | ICD-10-CM | POA: Diagnosis not present

## 2018-06-10 DIAGNOSIS — Z7982 Long term (current) use of aspirin: Secondary | ICD-10-CM | POA: Diagnosis not present

## 2018-06-10 DIAGNOSIS — Z5181 Encounter for therapeutic drug level monitoring: Secondary | ICD-10-CM | POA: Diagnosis not present

## 2018-06-10 DIAGNOSIS — Z7901 Long term (current) use of anticoagulants: Secondary | ICD-10-CM | POA: Diagnosis not present

## 2018-06-10 DIAGNOSIS — M545 Low back pain: Secondary | ICD-10-CM | POA: Diagnosis not present

## 2018-06-10 DIAGNOSIS — R2689 Other abnormalities of gait and mobility: Secondary | ICD-10-CM | POA: Diagnosis not present

## 2018-06-10 DIAGNOSIS — I69351 Hemiplegia and hemiparesis following cerebral infarction affecting right dominant side: Secondary | ICD-10-CM | POA: Diagnosis not present

## 2018-06-12 DIAGNOSIS — R2689 Other abnormalities of gait and mobility: Secondary | ICD-10-CM | POA: Diagnosis not present

## 2018-06-12 DIAGNOSIS — Z7982 Long term (current) use of aspirin: Secondary | ICD-10-CM | POA: Diagnosis not present

## 2018-06-12 DIAGNOSIS — I69351 Hemiplegia and hemiparesis following cerebral infarction affecting right dominant side: Secondary | ICD-10-CM | POA: Diagnosis not present

## 2018-06-12 DIAGNOSIS — Z5181 Encounter for therapeutic drug level monitoring: Secondary | ICD-10-CM | POA: Diagnosis not present

## 2018-06-12 DIAGNOSIS — M545 Low back pain: Secondary | ICD-10-CM | POA: Diagnosis not present

## 2018-06-12 DIAGNOSIS — Z7901 Long term (current) use of anticoagulants: Secondary | ICD-10-CM | POA: Diagnosis not present

## 2018-06-16 DIAGNOSIS — Z7982 Long term (current) use of aspirin: Secondary | ICD-10-CM | POA: Diagnosis not present

## 2018-06-16 DIAGNOSIS — M545 Low back pain: Secondary | ICD-10-CM | POA: Diagnosis not present

## 2018-06-16 DIAGNOSIS — R2689 Other abnormalities of gait and mobility: Secondary | ICD-10-CM | POA: Diagnosis not present

## 2018-06-16 DIAGNOSIS — Z7901 Long term (current) use of anticoagulants: Secondary | ICD-10-CM | POA: Diagnosis not present

## 2018-06-16 DIAGNOSIS — Z5181 Encounter for therapeutic drug level monitoring: Secondary | ICD-10-CM | POA: Diagnosis not present

## 2018-06-16 DIAGNOSIS — I69351 Hemiplegia and hemiparesis following cerebral infarction affecting right dominant side: Secondary | ICD-10-CM | POA: Diagnosis not present

## 2018-06-17 DIAGNOSIS — Z5181 Encounter for therapeutic drug level monitoring: Secondary | ICD-10-CM | POA: Diagnosis not present

## 2018-06-17 DIAGNOSIS — I69351 Hemiplegia and hemiparesis following cerebral infarction affecting right dominant side: Secondary | ICD-10-CM | POA: Diagnosis not present

## 2018-06-17 DIAGNOSIS — M545 Low back pain: Secondary | ICD-10-CM | POA: Diagnosis not present

## 2018-06-17 DIAGNOSIS — Z7982 Long term (current) use of aspirin: Secondary | ICD-10-CM | POA: Diagnosis not present

## 2018-06-17 DIAGNOSIS — Z7901 Long term (current) use of anticoagulants: Secondary | ICD-10-CM | POA: Diagnosis not present

## 2018-06-17 DIAGNOSIS — R2689 Other abnormalities of gait and mobility: Secondary | ICD-10-CM | POA: Diagnosis not present

## 2018-06-19 DIAGNOSIS — R2689 Other abnormalities of gait and mobility: Secondary | ICD-10-CM | POA: Diagnosis not present

## 2018-06-19 DIAGNOSIS — Z7901 Long term (current) use of anticoagulants: Secondary | ICD-10-CM | POA: Diagnosis not present

## 2018-06-19 DIAGNOSIS — M545 Low back pain: Secondary | ICD-10-CM | POA: Diagnosis not present

## 2018-06-19 DIAGNOSIS — M25552 Pain in left hip: Secondary | ICD-10-CM | POA: Diagnosis not present

## 2018-06-19 DIAGNOSIS — Z5181 Encounter for therapeutic drug level monitoring: Secondary | ICD-10-CM | POA: Diagnosis not present

## 2018-06-19 DIAGNOSIS — I69351 Hemiplegia and hemiparesis following cerebral infarction affecting right dominant side: Secondary | ICD-10-CM | POA: Diagnosis not present

## 2018-06-19 DIAGNOSIS — M1612 Unilateral primary osteoarthritis, left hip: Secondary | ICD-10-CM | POA: Diagnosis not present

## 2018-06-19 DIAGNOSIS — Z7982 Long term (current) use of aspirin: Secondary | ICD-10-CM | POA: Diagnosis not present

## 2018-06-22 DIAGNOSIS — M545 Low back pain: Secondary | ICD-10-CM | POA: Diagnosis not present

## 2018-06-22 DIAGNOSIS — Z5181 Encounter for therapeutic drug level monitoring: Secondary | ICD-10-CM | POA: Diagnosis not present

## 2018-06-22 DIAGNOSIS — Z7982 Long term (current) use of aspirin: Secondary | ICD-10-CM | POA: Diagnosis not present

## 2018-06-22 DIAGNOSIS — I69351 Hemiplegia and hemiparesis following cerebral infarction affecting right dominant side: Secondary | ICD-10-CM | POA: Diagnosis not present

## 2018-06-22 DIAGNOSIS — Z7901 Long term (current) use of anticoagulants: Secondary | ICD-10-CM | POA: Diagnosis not present

## 2018-06-22 DIAGNOSIS — R2689 Other abnormalities of gait and mobility: Secondary | ICD-10-CM | POA: Diagnosis not present

## 2018-06-23 DIAGNOSIS — R2689 Other abnormalities of gait and mobility: Secondary | ICD-10-CM | POA: Diagnosis not present

## 2018-06-23 DIAGNOSIS — Z7982 Long term (current) use of aspirin: Secondary | ICD-10-CM | POA: Diagnosis not present

## 2018-06-23 DIAGNOSIS — Z5181 Encounter for therapeutic drug level monitoring: Secondary | ICD-10-CM | POA: Diagnosis not present

## 2018-06-23 DIAGNOSIS — M545 Low back pain: Secondary | ICD-10-CM | POA: Diagnosis not present

## 2018-06-23 DIAGNOSIS — Z7901 Long term (current) use of anticoagulants: Secondary | ICD-10-CM | POA: Diagnosis not present

## 2018-06-23 DIAGNOSIS — I69351 Hemiplegia and hemiparesis following cerebral infarction affecting right dominant side: Secondary | ICD-10-CM | POA: Diagnosis not present

## 2018-06-25 DIAGNOSIS — Z7901 Long term (current) use of anticoagulants: Secondary | ICD-10-CM | POA: Diagnosis not present

## 2018-06-25 DIAGNOSIS — M545 Low back pain: Secondary | ICD-10-CM | POA: Diagnosis not present

## 2018-06-25 DIAGNOSIS — Z7982 Long term (current) use of aspirin: Secondary | ICD-10-CM | POA: Diagnosis not present

## 2018-06-25 DIAGNOSIS — Z5181 Encounter for therapeutic drug level monitoring: Secondary | ICD-10-CM | POA: Diagnosis not present

## 2018-06-25 DIAGNOSIS — I69351 Hemiplegia and hemiparesis following cerebral infarction affecting right dominant side: Secondary | ICD-10-CM | POA: Diagnosis not present

## 2018-06-25 DIAGNOSIS — R2689 Other abnormalities of gait and mobility: Secondary | ICD-10-CM | POA: Diagnosis not present

## 2018-06-29 DIAGNOSIS — Z5181 Encounter for therapeutic drug level monitoring: Secondary | ICD-10-CM | POA: Diagnosis not present

## 2018-06-29 DIAGNOSIS — Z7982 Long term (current) use of aspirin: Secondary | ICD-10-CM | POA: Diagnosis not present

## 2018-06-29 DIAGNOSIS — R2689 Other abnormalities of gait and mobility: Secondary | ICD-10-CM | POA: Diagnosis not present

## 2018-06-29 DIAGNOSIS — I69351 Hemiplegia and hemiparesis following cerebral infarction affecting right dominant side: Secondary | ICD-10-CM | POA: Diagnosis not present

## 2018-06-29 DIAGNOSIS — M545 Low back pain: Secondary | ICD-10-CM | POA: Diagnosis not present

## 2018-06-29 DIAGNOSIS — Z7901 Long term (current) use of anticoagulants: Secondary | ICD-10-CM | POA: Diagnosis not present

## 2018-06-30 DIAGNOSIS — R2689 Other abnormalities of gait and mobility: Secondary | ICD-10-CM | POA: Diagnosis not present

## 2018-06-30 DIAGNOSIS — Z7901 Long term (current) use of anticoagulants: Secondary | ICD-10-CM | POA: Diagnosis not present

## 2018-06-30 DIAGNOSIS — M545 Low back pain: Secondary | ICD-10-CM | POA: Diagnosis not present

## 2018-06-30 DIAGNOSIS — I69351 Hemiplegia and hemiparesis following cerebral infarction affecting right dominant side: Secondary | ICD-10-CM | POA: Diagnosis not present

## 2018-06-30 DIAGNOSIS — Z5181 Encounter for therapeutic drug level monitoring: Secondary | ICD-10-CM | POA: Diagnosis not present

## 2018-06-30 DIAGNOSIS — Z7982 Long term (current) use of aspirin: Secondary | ICD-10-CM | POA: Diagnosis not present

## 2018-07-02 DIAGNOSIS — Z7982 Long term (current) use of aspirin: Secondary | ICD-10-CM | POA: Diagnosis not present

## 2018-07-02 DIAGNOSIS — Z7901 Long term (current) use of anticoagulants: Secondary | ICD-10-CM | POA: Diagnosis not present

## 2018-07-02 DIAGNOSIS — M545 Low back pain: Secondary | ICD-10-CM | POA: Diagnosis not present

## 2018-07-02 DIAGNOSIS — Z5181 Encounter for therapeutic drug level monitoring: Secondary | ICD-10-CM | POA: Diagnosis not present

## 2018-07-02 DIAGNOSIS — R2689 Other abnormalities of gait and mobility: Secondary | ICD-10-CM | POA: Diagnosis not present

## 2018-07-02 DIAGNOSIS — I69351 Hemiplegia and hemiparesis following cerebral infarction affecting right dominant side: Secondary | ICD-10-CM | POA: Diagnosis not present

## 2018-07-06 DIAGNOSIS — Z7982 Long term (current) use of aspirin: Secondary | ICD-10-CM | POA: Diagnosis not present

## 2018-07-06 DIAGNOSIS — Z5181 Encounter for therapeutic drug level monitoring: Secondary | ICD-10-CM | POA: Diagnosis not present

## 2018-07-06 DIAGNOSIS — R2689 Other abnormalities of gait and mobility: Secondary | ICD-10-CM | POA: Diagnosis not present

## 2018-07-06 DIAGNOSIS — I69351 Hemiplegia and hemiparesis following cerebral infarction affecting right dominant side: Secondary | ICD-10-CM | POA: Diagnosis not present

## 2018-07-06 DIAGNOSIS — M545 Low back pain: Secondary | ICD-10-CM | POA: Diagnosis not present

## 2018-07-06 DIAGNOSIS — Z7901 Long term (current) use of anticoagulants: Secondary | ICD-10-CM | POA: Diagnosis not present

## 2018-07-07 DIAGNOSIS — Z5181 Encounter for therapeutic drug level monitoring: Secondary | ICD-10-CM | POA: Diagnosis not present

## 2018-07-07 DIAGNOSIS — Z7982 Long term (current) use of aspirin: Secondary | ICD-10-CM | POA: Diagnosis not present

## 2018-07-07 DIAGNOSIS — R2689 Other abnormalities of gait and mobility: Secondary | ICD-10-CM | POA: Diagnosis not present

## 2018-07-07 DIAGNOSIS — I69351 Hemiplegia and hemiparesis following cerebral infarction affecting right dominant side: Secondary | ICD-10-CM | POA: Diagnosis not present

## 2018-07-07 DIAGNOSIS — M545 Low back pain: Secondary | ICD-10-CM | POA: Diagnosis not present

## 2018-07-07 DIAGNOSIS — Z7901 Long term (current) use of anticoagulants: Secondary | ICD-10-CM | POA: Diagnosis not present

## 2018-07-13 DIAGNOSIS — M25552 Pain in left hip: Secondary | ICD-10-CM | POA: Diagnosis not present

## 2018-07-13 DIAGNOSIS — M1612 Unilateral primary osteoarthritis, left hip: Secondary | ICD-10-CM | POA: Diagnosis not present

## 2018-07-13 DIAGNOSIS — M545 Low back pain: Secondary | ICD-10-CM | POA: Diagnosis not present

## 2018-07-14 DIAGNOSIS — I69351 Hemiplegia and hemiparesis following cerebral infarction affecting right dominant side: Secondary | ICD-10-CM | POA: Diagnosis not present

## 2018-07-14 DIAGNOSIS — R2689 Other abnormalities of gait and mobility: Secondary | ICD-10-CM | POA: Diagnosis not present

## 2018-07-14 DIAGNOSIS — Z7982 Long term (current) use of aspirin: Secondary | ICD-10-CM | POA: Diagnosis not present

## 2018-07-14 DIAGNOSIS — Z7901 Long term (current) use of anticoagulants: Secondary | ICD-10-CM | POA: Diagnosis not present

## 2018-07-14 DIAGNOSIS — M545 Low back pain: Secondary | ICD-10-CM | POA: Diagnosis not present

## 2018-07-14 DIAGNOSIS — Z5181 Encounter for therapeutic drug level monitoring: Secondary | ICD-10-CM | POA: Diagnosis not present

## 2018-07-16 DIAGNOSIS — M545 Low back pain: Secondary | ICD-10-CM | POA: Diagnosis not present

## 2018-07-16 DIAGNOSIS — Z5181 Encounter for therapeutic drug level monitoring: Secondary | ICD-10-CM | POA: Diagnosis not present

## 2018-07-16 DIAGNOSIS — R2689 Other abnormalities of gait and mobility: Secondary | ICD-10-CM | POA: Diagnosis not present

## 2018-07-16 DIAGNOSIS — Z7982 Long term (current) use of aspirin: Secondary | ICD-10-CM | POA: Diagnosis not present

## 2018-07-16 DIAGNOSIS — I69351 Hemiplegia and hemiparesis following cerebral infarction affecting right dominant side: Secondary | ICD-10-CM | POA: Diagnosis not present

## 2018-07-16 DIAGNOSIS — Z7901 Long term (current) use of anticoagulants: Secondary | ICD-10-CM | POA: Diagnosis not present

## 2018-07-22 DIAGNOSIS — R2689 Other abnormalities of gait and mobility: Secondary | ICD-10-CM | POA: Diagnosis not present

## 2018-07-22 DIAGNOSIS — Z7901 Long term (current) use of anticoagulants: Secondary | ICD-10-CM | POA: Diagnosis not present

## 2018-07-22 DIAGNOSIS — M545 Low back pain: Secondary | ICD-10-CM | POA: Diagnosis not present

## 2018-07-22 DIAGNOSIS — Z7982 Long term (current) use of aspirin: Secondary | ICD-10-CM | POA: Diagnosis not present

## 2018-07-22 DIAGNOSIS — Z5181 Encounter for therapeutic drug level monitoring: Secondary | ICD-10-CM | POA: Diagnosis not present

## 2018-07-22 DIAGNOSIS — I69351 Hemiplegia and hemiparesis following cerebral infarction affecting right dominant side: Secondary | ICD-10-CM | POA: Diagnosis not present

## 2018-07-24 DIAGNOSIS — M545 Low back pain: Secondary | ICD-10-CM | POA: Diagnosis not present

## 2018-07-24 DIAGNOSIS — Z7901 Long term (current) use of anticoagulants: Secondary | ICD-10-CM | POA: Diagnosis not present

## 2018-07-24 DIAGNOSIS — I69351 Hemiplegia and hemiparesis following cerebral infarction affecting right dominant side: Secondary | ICD-10-CM | POA: Diagnosis not present

## 2018-07-24 DIAGNOSIS — Z7982 Long term (current) use of aspirin: Secondary | ICD-10-CM | POA: Diagnosis not present

## 2018-07-24 DIAGNOSIS — Z5181 Encounter for therapeutic drug level monitoring: Secondary | ICD-10-CM | POA: Diagnosis not present

## 2018-07-24 DIAGNOSIS — R2689 Other abnormalities of gait and mobility: Secondary | ICD-10-CM | POA: Diagnosis not present

## 2018-07-28 DIAGNOSIS — Z7982 Long term (current) use of aspirin: Secondary | ICD-10-CM | POA: Diagnosis not present

## 2018-07-28 DIAGNOSIS — R2689 Other abnormalities of gait and mobility: Secondary | ICD-10-CM | POA: Diagnosis not present

## 2018-07-28 DIAGNOSIS — M545 Low back pain: Secondary | ICD-10-CM | POA: Diagnosis not present

## 2018-07-28 DIAGNOSIS — Z7901 Long term (current) use of anticoagulants: Secondary | ICD-10-CM | POA: Diagnosis not present

## 2018-07-28 DIAGNOSIS — I69351 Hemiplegia and hemiparesis following cerebral infarction affecting right dominant side: Secondary | ICD-10-CM | POA: Diagnosis not present

## 2018-07-28 DIAGNOSIS — Z5181 Encounter for therapeutic drug level monitoring: Secondary | ICD-10-CM | POA: Diagnosis not present

## 2018-07-29 DIAGNOSIS — Z5181 Encounter for therapeutic drug level monitoring: Secondary | ICD-10-CM | POA: Diagnosis not present

## 2018-07-29 DIAGNOSIS — I69351 Hemiplegia and hemiparesis following cerebral infarction affecting right dominant side: Secondary | ICD-10-CM | POA: Diagnosis not present

## 2018-07-29 DIAGNOSIS — Z7901 Long term (current) use of anticoagulants: Secondary | ICD-10-CM | POA: Diagnosis not present

## 2018-07-29 DIAGNOSIS — R2689 Other abnormalities of gait and mobility: Secondary | ICD-10-CM | POA: Diagnosis not present

## 2018-07-29 DIAGNOSIS — M545 Low back pain: Secondary | ICD-10-CM | POA: Diagnosis not present

## 2018-07-29 DIAGNOSIS — Z7982 Long term (current) use of aspirin: Secondary | ICD-10-CM | POA: Diagnosis not present

## 2018-08-03 ENCOUNTER — Ambulatory Visit: Payer: Self-pay | Admitting: Neurology

## 2018-08-20 ENCOUNTER — Encounter: Payer: Self-pay | Admitting: Neurology

## 2018-08-20 ENCOUNTER — Ambulatory Visit (INDEPENDENT_AMBULATORY_CARE_PROVIDER_SITE_OTHER): Payer: Medicare Other | Admitting: Neurology

## 2018-08-20 VITALS — BP 145/87 | HR 75 | Ht 64.0 in | Wt 163.0 lb

## 2018-08-20 DIAGNOSIS — R2689 Other abnormalities of gait and mobility: Secondary | ICD-10-CM | POA: Diagnosis not present

## 2018-08-20 DIAGNOSIS — Z7901 Long term (current) use of anticoagulants: Secondary | ICD-10-CM | POA: Diagnosis not present

## 2018-08-20 DIAGNOSIS — G218 Other secondary parkinsonism: Secondary | ICD-10-CM | POA: Diagnosis not present

## 2018-08-20 DIAGNOSIS — I69359 Hemiplegia and hemiparesis following cerebral infarction affecting unspecified side: Secondary | ICD-10-CM

## 2018-08-20 NOTE — Patient Instructions (Addendum)
You don't have classic parkinsonian symptoms, but may have some features that can be seen in parkinsonism.   Your situation is complicated by your orthopedic issues, residual weakness from the stroke and ageing, it's not impossible that you have developed a mild parkisonism like picture.   I would like to do a DaT scan: This is a specialized brain scan designed to help with diagnosis of tremor disorders. A radioactive marker gets injected and the uptake is measured in the brain and compared to normal controls and right side is compared to the left, a change in uptake can help with diagnosis of certain tremor disorders. A brain MRI on the other hand is a brain scan that helps look at the brain structure in more detail overall and look for age-related changes, blood vessel related changes and look for stroke and volume loss which we call atrophy.   I am worried about your driving, please have your family monitor it and I would suggest at this point only local roads, familiar routes, no nighttime and no highway driving.

## 2018-08-20 NOTE — Progress Notes (Signed)
Subjective:    [name] is a 72 y.o. female.  HPI     [name], MD, [name] [facility] [address], [address] [place], [place] [address]  Dear Dr. Ron Agee,   I saw your [name], Tiffany [name], upon your kind request in my neurologic clinic today for initial consultation of her gait disorder. Tiffany [name] is accompanied by her daughter today. 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 She had multiple strokes in Tiffany left MCA area in 2010. She saw Dr. Erling Cruz at Tiffany time. She had over time gradual but very good recovery to Tiffany point where she had mild residual right-sided weakness but regained her ability to live by herself and drive a car using her left foot with a specially equipped car. I reviewed your office note from 06/09/2018, which you kindly included. 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 She had injection of Tiffany lumbar spine with improvement of her back pain. She has arthritis of Tiffany left hip. She was noted to have a shuffling gait and tremors. Of note, she had a brain MRI with and without contrast as well as MRA head without contrast and MRA neck with and without contrast on 08/31/2009 I reviewed Tiffany results:  MRI BRAIN: IMPRESSION: Scattered areas of acute infarct in Tiffany left middle cerebral artery distribution including Tiffany left putamen.  There is some enhancement of Tiffany left posterior temporal left parietal lobe related to subacute infarcts.  Some of these infarcts may be subacute and others may be  more acute.   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  There is no flow identified in Tiffany left middle cerebral artery.  Both anterior cerebral arteries are supplied from Tiffany right.  There is a mild intracranial atherosclerotic disease in Tiffany posterior cerebral arteries bilaterally.   MRA NECK: IMPRESSION: No significant carotid or vertebral artery stenosis.  She reports no recent falls but has noticed difficulty with her balance and her gait gradually over months. She has started using her walker more consistently whereas she had been comfortable enough to mobilize without any walking aid inside her home. She has had no significant cognitive issues. She lives alone. She has 2 children, older daughter lives in [place] and son lives in [place]. She quit smoking in 2010. She does not utilize alcohol, she drinks caffeine in Tiffany form of coffee, 2 cups per day on average. Her latest INR was subtherapeutic and she reports that she ate too many leafy greens and vegetables. She has had less confidence in her driving and in Tiffany past 6 months has avoided longer distance driving, such as visiting her kids in [place] and [place].   Her Past Medical History Is Significant For: Past Medical History:  Diagnosis Date  . Allergic rhinitis   . Clotting disorder ([place])   . Diabetes mellitus without complication ([place])    no meds-diet controlled  . Heart murmur    mild mitral regurg-echo 2010  . Hyperlipidemia   . Hypertension   . Lupus anticoagulant positive   . Obesity   .  Stroke [facility]) 2010    Her Past Surgical History Is Significant For: Past Surgical History:  Procedure Laterality Date  . ABDOMINAL HYSTERECTOMY  1981  . APPENDECTOMY    . AXILLARY LYMPH NODE BIOPSY Left 05/16/2014   Procedure: EXCISION 2CM LEFT AXILLARY MASS;  Surgeon: [name], MD;  Location: [facility];  Service: General;  Laterality: Left;  Left axillary sebaceous  cyst excision  . DILATION AND CURETTAGE OF UTERUS    . SKIN CANCER EXCISION  2016   [facility]    Her Family History Is Significant For: Family History  Problem Relation Age of Onset  . Heart disease Mother   . COPD Father   . Colon cancer Sister   . Clotting disorder Sister   . Diabetes Sister   . Kidney disease Sister   . Heart disease Brother     Her Social History Is Significant For: Social History   Socioeconomic History  . Marital status: Divorced    Spouse name: Not on file  . Number of children: 2  . Years of education: Not on file  . Highest education level: Not on file  Occupational History  . Occupation: Retired  Scientific laboratory technician  . Financial resource strain: Not on file  . Food insecurity:    Worry: Not on file    Inability: Not on file  . Transportation needs:    Medical: Not on file    Non-medical: Not on file  Tobacco Use  . Smoking status: Former Smoker    Types: Cigarettes    Last attempt to quit: 03/14/2009    Years since quitting: 9.4  . Smokeless tobacco: Never Used  Substance and Sexual Activity  . Alcohol use: No    Alcohol/week: 0.0 standard drinks  . Drug use: No  . Sexual activity: Not on file  Lifestyle  . Physical activity:    Days per week: Not on file    Minutes per session: Not on file  . Stress: Not on file  Relationships  . Social connections:    Talks on phone: Not on file    Gets together: Not on file    Attends religious service: Not on file    Active member of club or organization: Not on file    Attends meetings of clubs or organizations: Not on file    Relationship status: Not on file  Other Topics Concern  . Not on file  Social History Narrative  . Not on file    Her Allergies Are:  Allergies  Allergen Reactions  . Codeine     hallucination  :   Her Current Medications Are:  Outpatient Encounter Medications as of 08/20/2018  Medication Sig  . Acetaminophen (TYLENOL ARTHRITIS PAIN PO) Take by mouth.  Marland Kitchen amLODipine  (NORVASC) 5 MG tablet Take 5 mg by mouth 2 (two) times daily.   Marland Kitchen aspirin 81 MG tablet Take 81 mg by mouth daily.  . cholecalciferol (VITAMIN D) 1000 UNITS tablet Take 2,000 Units by mouth daily.  Marland Kitchen escitalopram (LEXAPRO) 10 MG tablet Take 10 mg by mouth daily.  Marland Kitchen ezetimibe (ZETIA) 10 MG tablet Take 10 mg by mouth every 3 (three) days. Half 3x week  . fluticasone (FLONASE) 50 MCG/ACT nasal spray USE TWO SPRAYS IN EACH NOSTRIL ONCE DAILY  . lisinopril (PRINIVIL,ZESTRIL) 20 MG tablet Take 10 mg by mouth 2 (two) times daily.  . simvastatin (ZOCOR) 20 MG tablet Take 20 mg by mouth daily at 6 PM.   .  warfarin (COUMADIN) 5 MG tablet Take 5 mg by mouth daily. Thursday-extra 2.5mg   . [DISCONTINUED] lisinopril (PRINIVIL,ZESTRIL) 10 MG tablet Take 5 mg by mouth 2 (two) times daily.   . [DISCONTINUED] nitrofurantoin, macrocrystal-monohydrate, (MACROBID) 100 MG capsule Take 100 mg by mouth 2 (two) times daily.   No facility-administered encounter medications on file as of 08/20/2018.   : Review of Systems:  Out of a complete 14 point review of systems, all are reviewed and negative with Tiffany exception of these symptoms as listed below:  Review of Systems  Neurological:       Pt presents today to discuss her gait. Pt is concerned about her toes flexing/cramping when she is driving. Pt is right handed. Pt only gets tremors in her hands when her protein is low.    Objective:  Neurological Exam  Physical Exam Physical Examination:   Vitals:   08/20/18 1434  BP: (!) 145/87  Pulse: 75    General Examination: Tiffany [name] is a very pleasant 72 y.o. female in no acute distress. She appears well-developed and well-nourished and well groomed. She is mildly anxious appearing at times, she is near tears at one point.  HEENT: Normocephalic, atraumatic, pupils are equal, round and reactive to light and accommodation. Corrective eyeglasses in place. Extraocular tracking is fair, she has no obvious facial  masking or facial weakness. She has very mild dysarthria and word finding difficulty, some slowness in responding, no obvious hypophonia. She has fairly normal nuchal tone. She has good range of motion in her neck. She has no orofacial dyskinesias. Tongue protrudes centrally and palate elevates symmetrically. Hearing is grossly intact.  Chest: Clear to auscultation without wheezing, rhonchi or crackles noted.  Heart: S1+S2+0, regular and normal without murmurs, rubs or gallops noted.   Abdomen: Soft, non-tender and non-distended with normal bowel sounds appreciated on auscultation.  Extremities: There is no pitting edema in Tiffany distal lower extremities bilaterally. Pedal pulses are intact.  Skin: Warm and dry without trophic changes noted. Mild bruising noted in her legs and arms, of note, she is on Coumadin.  Musculoskeletal: exam reveals: Decrease in range of motion right foot, decrease in range of motion left hip.   Neurologically:  Mental status: Tiffany [name] is awake, alert and oriented in all 4 spheres. Her immediate and remote memory, attention, language skills and fund of knowledge are appropriate. There is no evidence of aphasia, agnosia, apraxia or anomia. Speech is clear with normal prosody and enunciation. Thought process is linear. Mood is normal and affect is normal.  Cranial nerves II - XII are as described above under HEENT exam. In addition: shoulder shrug is normal with equal shoulder height noted. Motor exam: Normal bulk, strength and tone is notedon Tiffany left, she has mild increase in tone in Tiffany right upper extremity, no telltale cogwheeling. She has no resting tremor, she has no obvious postural or action tremor. Strength is 4 out of 5 in Tiffany right upper extremity, slightly weaker generally in Tiffany right lower extremity. She has mild weakness in right foot dorsiflexion. Reflexes are fairly symmetrical. Sensory exam is intact to light touch. She has mild fine motor dyscontrol in  Tiffany right upper extremity and right lower extremity. There is no drift, Romberg is not tested due to safety concern.  Cerebellar testing: No dysmetria or intention tremor on finger to nose testing. Heel to shin is not tested due to left hip pain and right-sided weakness. Sensory exam: intact to light touch.  Gait, station  and balance: She stands with difficulty and has to push to self up, she stands slightly wide-based. She has a 3 wheeled walker and also a 3, she does not feel comfortable walking unassisted. She holds onto her daughters in my hand on each side and walks with a wide-based gait, no shuffling noted, no telltale freezing or festination, slight steppage on Tiffany right side. Balance is impaired.               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 On examination, she has no telltale signs and symptoms of idiopathic Parkinson's disease. She does have fine motor dyscontrol particularly on Tiffany right side. She has mild increase in tone on Tiffany right side which could be residual from her stroke. She had multiple left MCA strokes in Tiffany context of hypercoagulability state. She has been on Coumadin. She has had orthopedic issues including low back pain and left hip pain and has had some symptomatic treatment in Tiffany form of injections. 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 I would like to proceed with a nuclear medicine DaT scan for more diagnostic help. We may  consider a brain MRI but her symptoms do not support another stroke or TIA. We talked about supportive treatments and healthy lifestyle, Tiffany importance of pursuing good nutrition and optimal hydration. She admits that she does not always drink enough water. She is advised to use her walker at all times. I'm worried about her ability to drive. She has a specially adapted car to be able to use her left leg for driving. She has limited her driving on her own and avoids long-distance driving at this point. I suggested follow-up after her DaT scan. I did not suggest any new medications from my end of things at this time. I answered all their questions today and Tiffany [name] and her daughter were in agreement.  Thank you very much for allowing me to participate in Tiffany care of this nice [name]. If I can be of any further assistance to you please do not hesitate to call me at [phone].  Sincerely,   [name], MD, [name]

## 2018-08-28 DIAGNOSIS — Z7901 Long term (current) use of anticoagulants: Secondary | ICD-10-CM | POA: Diagnosis not present

## 2018-09-01 DIAGNOSIS — Z1231 Encounter for screening mammogram for malignant neoplasm of breast: Secondary | ICD-10-CM | POA: Diagnosis not present

## 2018-09-24 DIAGNOSIS — Z7901 Long term (current) use of anticoagulants: Secondary | ICD-10-CM | POA: Diagnosis not present

## 2018-09-24 DIAGNOSIS — M1612 Unilateral primary osteoarthritis, left hip: Secondary | ICD-10-CM | POA: Diagnosis not present

## 2018-11-05 ENCOUNTER — Telehealth: Payer: Self-pay

## 2018-11-05 ENCOUNTER — Other Ambulatory Visit (HOSPITAL_COMMUNITY)
Admission: RE | Admit: 2018-11-05 | Discharge: 2018-11-05 | Disposition: A | Discharge status: Home / Self Care | Payer: Medicare Other | Source: Ambulatory Visit | Attending: Internal Medicine | Admitting: Internal Medicine

## 2018-11-05 ENCOUNTER — Other Ambulatory Visit (HOSPITAL_COMMUNITY)
Admission: RE | Admit: 2018-11-05 | Discharge: 2018-11-05 | Disposition: A | Discharge status: Home / Self Care | Payer: Medicare Other | Source: Other Acute Inpatient Hospital | Attending: Internal Medicine | Admitting: Internal Medicine

## 2018-11-05 ENCOUNTER — Encounter (HOSPITAL_COMMUNITY)
Admission: RE | Admit: 2018-11-05 | Discharge: 2018-11-05 | Disposition: A | Discharge status: Home / Self Care | Payer: Medicare Other | Source: Ambulatory Visit | Attending: Neurology | Admitting: Neurology

## 2018-11-05 DIAGNOSIS — I69359 Hemiplegia and hemiparesis following cerebral infarction affecting unspecified side: Secondary | ICD-10-CM

## 2018-11-05 DIAGNOSIS — R251 Tremor, unspecified: Secondary | ICD-10-CM | POA: Diagnosis not present

## 2018-11-05 DIAGNOSIS — R2689 Other abnormalities of gait and mobility: Secondary | ICD-10-CM | POA: Diagnosis not present

## 2018-11-05 DIAGNOSIS — R269 Unspecified abnormalities of gait and mobility: Secondary | ICD-10-CM | POA: Diagnosis not present

## 2018-11-05 DIAGNOSIS — G218 Other secondary parkinsonism: Secondary | ICD-10-CM | POA: Diagnosis not present

## 2018-11-05 DIAGNOSIS — R791 Abnormal coagulation profile: Secondary | ICD-10-CM | POA: Insufficient documentation

## 2018-11-05 LAB — PROTIME-INR
INR: 2.12
Prothrombin Time: 23.5 seconds — ABNORMAL HIGH (ref 11.4–15.2)

## 2018-11-05 MED ORDER — IOFLUPANE I 123 185 MBQ/2.5ML IV SOLN
4.5000 | Freq: Once | INTRAVENOUS | Status: AC
  Administered 2018-11-05: 4.5 via INTRAVENOUS
  Filled 2018-11-05: qty 5

## 2018-11-05 MED ORDER — IODINE STRONG (LUGOLS) 5 % PO SOLN
0.8000 mL | Freq: Once | ORAL | Status: AC
  Administered 2018-11-05: 0.8 mL via ORAL

## 2018-11-05 NOTE — Telephone Encounter (Signed)
-----   Message from [name], MD sent at 11/05/2018  4:01 PM EST ----- Please call pt regarding the recent DaT scan: the radioactive marker uptake pattern was not very clear in that the findings may be confounded by the fact that she had a stroke. The uptake is less on the Left, but she had a stroke in that area as well. On the reassuring side, she has normal findings on the right side and the decrease in uptake on the left side may be due to the prior stroke. All in all, not altogether supportive of a true parkinsonian picture, unless findings are due to her prior stroke. We will have to follow her symptoms clinically.  Has appt in Feb, please remind her to keep it.  Michel Bickers

## 2018-11-05 NOTE — Telephone Encounter (Signed)
I called pt to discuss her DAT scan results. No answer, left a message asking her to call me back. 

## 2018-11-05 NOTE — Progress Notes (Signed)
Please call pt regarding the recent DaT scan: the radioactive marker uptake pattern was not very clear in that the findings may be confounded by the fact that she had a stroke. The uptake is less on the Left, but she had a stroke in that area as well. On the reassuring side, she has normal findings on the right side and the decrease in uptake on the left side may be due to the prior stroke. All in all, not altogether supportive of a true parkinsonian picture, unless findings are due to her prior stroke. We will have to follow her symptoms clinically.  Has appt in Feb, please remind her to keep it.  Michel Bickers

## 2018-11-05 NOTE — Telephone Encounter (Signed)
Pt returned my call. I advised her of her DAT scan results. She will keep her follow up in February for further discussion of the results and clinical assessment. Pt verbalized understanding of results. Pt had no questions at this time but was encouraged to call back if questions arise.

## 2018-11-09 DIAGNOSIS — I619 Nontraumatic intracerebral hemorrhage, unspecified: Secondary | ICD-10-CM | POA: Diagnosis not present

## 2018-11-09 DIAGNOSIS — Z6828 Body mass index (BMI) 28.0-28.9, adult: Secondary | ICD-10-CM | POA: Diagnosis not present

## 2018-11-18 ENCOUNTER — Encounter: Payer: Self-pay | Admitting: Neurology

## 2018-11-18 ENCOUNTER — Ambulatory Visit (INDEPENDENT_AMBULATORY_CARE_PROVIDER_SITE_OTHER): Payer: Medicare Other | Admitting: Neurology

## 2018-11-18 VITALS — BP 130/71 | HR 89 | Ht 64.0 in | Wt 167.0 lb

## 2018-11-18 DIAGNOSIS — G218 Other secondary parkinsonism: Secondary | ICD-10-CM | POA: Diagnosis not present

## 2018-11-18 DIAGNOSIS — R2689 Other abnormalities of gait and mobility: Secondary | ICD-10-CM

## 2018-11-18 DIAGNOSIS — I69359 Hemiplegia and hemiparesis following cerebral infarction affecting unspecified side: Secondary | ICD-10-CM

## 2018-11-18 DIAGNOSIS — M1612 Unilateral primary osteoarthritis, left hip: Secondary | ICD-10-CM | POA: Diagnosis not present

## 2018-11-18 NOTE — Patient Instructions (Signed)
Let's wait things out after the L hip surgery.  We may do another MRI down the road with special attention to the Caudate area on the L, close to where your stroke was.  We may consider trying a medication for Parkinsonism at some point. Let's follow up in 6 months, because you may also try a device for your R foot drop in the interim.

## 2018-11-18 NOTE — Progress Notes (Signed)
Subjective:    Patient ID: [name] is a 73 y.o. female.  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 The patient is accompanied by her daughter again today. I first met her on 08/20/2018 at the request of Dr. Ron Agee, at which time she reported worsening of her gait, intermittent tremors, balance issues with ongoing but stable right hemiparesis from previous strokes. She had no telltale symptoms or signs of Parkinson's disease but had some signs of parkinsonism. I suggested we proceed with a DaT scan.   She had a DaT scan on 11/05/18 and I reviewed the results:   IMPRESSION: Findings confounded by a LEFT putamen infarction identified on MRI of 08/31/2009 with expected loss of posterior striata activity on the LEFT. There is however decreased activity in the head of the LEFT caudate nucleus compared to the RIGHT. The RIGHT caudate nucleus appears relatively normal. Findings are suggestive of Parkinson's syndrome pathology with some reservation.   Repeat MRI may be of benefit to determine if the initial basal ganglia infarct evolved to involve the caudate nucleus on the LEFT which could explain the findings.   We called her with the test results.   Today, 11/18/2018: She reports ongoing issues with left hip pain, which she will need a hip replacement surgery, she is meeting with Dr. Mardelle Matte today. She had a consultation with a rehabilitation specialist through [facility] and I reviewed the note, she had her first visit with Dr. Cleda Mccreedy in January 2020. She may be eligible for a device that can help her chronic right foot drop. She was also advised to  consider consultation with a hematologist because of her clotting disorder. She is on aspirin and warfarin. She suspects that part of her trembling could be a blood sugar fluctuations, particularly hypoglycemia. She has a glucometer at home but it is old. She had no recent falls. She did have a syncopal spell sometime ago and at that time her blood pressure was rather low, systolic in the 76E as she recalls.  The patient's allergies, current medications, family history, past medical history, past social history, past surgical history and problem list were reviewed and updated as appropriate.   Previously:  08/20/2018: (She) reports gradual worsening of her gait, also intermittent tremors, balance issues. 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 She had multiple strokes in the left MCA area in 2010. She saw Dr. Erling Cruz at the time. She had over time gradual but very good recovery to the point where she had mild residual right-sided weakness but regained her ability to live by herself and drive a car using her left foot with a specially equipped car. I reviewed your office note from 06/09/2018, which you kindly included. She has a history of multilevel degenerative disc disease of the lumbar spine. She had injection of the lumbar spine with improvement of her back pain. She has arthritis of the left hip. She was noted to have a shuffling gait and tremors. Of note, she had a brain MRI with and without contrast as well as MRA head without contrast and MRA neck with and without contrast on 08/31/2009 and I reviewed the results:   MRI BRAIN: IMPRESSION: Scattered areas  of acute infarct in the left middle cerebral artery distribution including the left putamen.  There is some enhancement of the left posterior temporal left parietal lobe related to subacute infarcts.  Some of these infarcts may be subacute and others may be more acute.   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  There is no flow identified in the left middle cerebral artery.  Both anterior cerebral arteries are supplied from the right.  There is a mild intracranial atherosclerotic disease in the posterior cerebral arteries bilaterally.   MRA NECK: IMPRESSION: No significant carotid or vertebral artery stenosis.   She reports no recent falls but has noticed difficulty with her balance and her gait gradually over months. She has started using her walker more consistently whereas she had been comfortable enough to mobilize without any walking aid inside her home. She has had no significant cognitive issues. She lives alone. She has 2 children, older daughter lives in [place] and son lives in [place]. She quit smoking in 2010. She does not utilize alcohol, she drinks caffeine in the form of coffee, 2 cups per day on average. Her latest INR was subtherapeutic and she reports that she ate too many leafy greens and vegetables. She has had less confidence in her driving and in the past 6 months has avoided longer distance driving, such as visiting her kids in [place] and [place].   Her Past Medical History Is Significant For: Past Medical History:  Diagnosis Date  . Allergic rhinitis   . Clotting disorder ([place])   . Diabetes mellitus without complication ([place])    no meds-diet controlled  . Heart murmur    mild mitral regurg-echo 2010  . Hyperlipidemia   . Hypertension   . Lupus anticoagulant positive   . Obesity   . Stroke [facility]) 2010    Her Past Surgical History Is Significant For: Past Surgical History:  Procedure Laterality Date  . ABDOMINAL HYSTERECTOMY  1981  . APPENDECTOMY    . AXILLARY LYMPH NODE BIOPSY Left 05/16/2014   Procedure: EXCISION 2CM LEFT AXILLARY MASS;  Surgeon: [name], MD;  Location: [facility];  Service: General;  Laterality: Left;  Left axillary sebaceous cyst excision  . DILATION AND CURETTAGE OF UTERUS     . SKIN CANCER EXCISION  2016   [facility]    Her Family History Is Significant For: Family History  Problem Relation Age of Onset  . Heart disease Mother   . COPD Father   . Colon cancer Sister   . Clotting disorder Sister   . Diabetes Sister   . Kidney disease Sister   . Heart disease Brother     Her Social History Is Significant For: Social History   Socioeconomic History  . Marital status: Divorced    Spouse name: Not on file  . Number of children: 2  . Years of education: Not on file  . Highest education level: Not on file  Occupational History  . Occupation: Retired  Scientific laboratory technician  . Financial resource strain: Not on file  . Food insecurity:    Worry: Not on file    Inability: Not on file  . Transportation needs:    Medical: Not on file    Non-medical: Not on file  Tobacco Use  . Smoking status: Former Smoker    Types: Cigarettes    Last attempt to quit: 03/14/2009    Years since quitting: 9.6  . Smokeless tobacco: Never  Used  Substance and Sexual Activity  . Alcohol use: No    Alcohol/week: 0.0 standard drinks  . Drug use: No  . Sexual activity: Not on file  Lifestyle  . Physical activity:    Days per week: Not on file    Minutes per session: Not on file  . Stress: Not on file  Relationships  . Social connections:    Talks on phone: Not on file    Gets together: Not on file    Attends religious service: Not on file    Active member of club or organization: Not on file    Attends meetings of clubs or organizations: Not on file    Relationship status: Not on file  Other Topics Concern  . Not on file  Social History Narrative  . Not on file    Her Allergies Are:  Allergies  Allergen Reactions  . Codeine     hallucination  :   Her Current Medications Are:  Outpatient Encounter Medications as of 11/18/2018  Medication Sig  . Acetaminophen (TYLENOL ARTHRITIS PAIN PO) Take by mouth.  Marland Kitchen amLODipine (NORVASC) 5 MG tablet Take 5 mg by mouth 2 (two) times  daily.   Marland Kitchen aspirin 81 MG tablet Take 81 mg by mouth daily.  . B Complex Vitamins (B COMPLEX PO) Take by mouth.  Marland Kitchen BIOTIN PO Take by mouth.  Marland Kitchen CALCIUM PO Take by mouth.  . cholecalciferol (VITAMIN D) 1000 UNITS tablet Take 2,000 Units by mouth daily.  Marland Kitchen escitalopram (LEXAPRO) 10 MG tablet Take 10 mg by mouth daily.  Marland Kitchen ezetimibe (ZETIA) 10 MG tablet Take 10 mg by mouth every 3 (three) days. Half 3x week  . fluticasone (FLONASE) 50 MCG/ACT nasal spray USE TWO SPRAYS IN EACH NOSTRIL ONCE DAILY  . lisinopril (PRINIVIL,ZESTRIL) 20 MG tablet Take 10 mg by mouth 2 (two) times daily.  . Magnesium 250 MG TABS Take by mouth.  . simvastatin (ZOCOR) 20 MG tablet Take 20 mg by mouth daily at 6 PM.   . vitamin B-12 (CYANOCOBALAMIN) 1000 MCG tablet Take 1,000 mcg by mouth daily.  Marland Kitchen warfarin (COUMADIN) 5 MG tablet Take 5 mg by mouth daily. Thursday-extra 2.51m   No facility-administered encounter medications on file as of 11/18/2018.   :  Review of Systems:  Out of a complete 14 point review of systems, all are reviewed and negative with the exception of these symptoms as listed below: Review of Systems  Neurological:       Pt presents today to discuss her DAT scan results. Pt denies any recent falls.    Objective:  Neurological Exam  Physical Exam Physical Examination:   Vitals:   11/18/18 0840  BP: 130/71  Pulse: 89    General Examination: The patient is a very pleasant 73y.o. female in no acute distress. She appears well-developed and well-nourished and well groomed.   HEENT: Normocephalic, atraumatic, pupils are equal, round and reactive to light and accommodation. Corrective eyeglasses in place. Extraocular tracking is fair, she has no obvious facial masking or facial weakness. She has very mild dysarthria and word finding difficulty, some slowness in responding, no obvious hypophonia, all stable. She has fairly normal nuchal tone. She has good range of motion in her neck. She has no  orofacial dyskinesias. Tongue protrudes centrally and palate elevates symmetrically. Hearing is grossly intact.  Chest: Clear to auscultation without wheezing, rhonchi or crackles noted.  Heart: S1+S2+0, regular and normal without murmurs, rubs or gallops noted.  Abdomen: Soft, non-tender and non-distended with normal bowel sounds appreciated on auscultation.  Extremities: There is no pitting edema in the distal lower extremities bilaterally.   Skin: Warm and dry without trophic changes noted. Mild chronic appearing bruising noted in her legs and arms, of note, she is on Coumadin and ASA.  Musculoskeletal: exam reveals: Decrease in range of motion right foot, decrease in range of motion left hip, discomfort.  Neurologically:  Mental status: The patient is awake, alert and oriented in all 4 spheres. Her immediate and remote memory, attention, language skills and fund of knowledge are appropriate. There is no evidence of aphasia, agnosia, apraxia or anomia, but slow in responding and speech mildly dysarthric. Thought process is linear. Mood is normal and affect is normal.  Cranial nerves II - XII are as described above under HEENT exam.  Motor exam: Normal bulk, strength and tone is noted on the left, she has mild increase in tone in the right upper extremity, no telltale cogwheeling, more in the realm of spasticity. She has no resting tremor, she has no obvious postural or action tremor. Strength is 4 out of 5 in the right upper extremity, slightly weaker generally in the right lower extremity with R foot drop, decrease ROM L hip. Reflexes are fairly symmetrical. Sensory exam is intact to light touch. She has mild fine motor dyscontrol in the right upper extremity and right lower extremity. There is no drift, Romberg is not tested due to safety concern.  Cerebellar testing: No dysmetria or intention tremor on finger to nose testing. Heel to shin is not tested due to left hip pain and  right-sided weakness. Sensory exam: intact to light touch.  Gait, station and balance: she leans to the L in the chair. She stands with difficulty and has to push to self up, she stands slightly wide-based. She has a 3 wheeled walker, maneuvers it well. She walks with a wide-based gait, no shuffling noted, no telltale freezing or festination, slight steppage on the right side. Balance is impaired.               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 On examination, she has no telltale signs and symptoms of idiopathic Parkinson's disease. She does have fine motor dyscontrol particularly on the right side. She has mild increase in tone on the right side which could be residual from her stroke. She had multiple left MCA strokes in the context of hypercoagulability state in the past and has been on Coumadin. Her recent DaT scan from last month was not conclusive enough to support an underlying true parkinsonian disorder. She did have a stroke in the putamen area and perhaps also questionable into the caudate area. We could potentially pursue an MRI, perhaps with more detailed or thinner cuts through the basal ganglia area in the future. She has had other issues which make her situation more complicated. On the more favorable side her nuclear medicine scan showed normal findings on the right. She has seen a stroke specialist or rehabilitation specialist recently and may be able to use a device for her foot drop which may help her residual gait disorder. She is pursuing left  hip replacement surgery and is hoping to do this within the next 2 months.  She may benefit from seeing a hematologist for her hypercoagulable disorder. This was recommended by her stroke specialist as well. For now, we mutually agreed to follow her clinically. We could potentially consider utilizing Sinemet down the road and perhaps repeating her MRI. I would like to see her back in 6 months with the help that she has been able to have her hip replacement surgery and rehabilitation in the interim. She also has a follow-up appointment at [facility] with the PM&R physician in July. I answered all their questions today and the patient and her daughter were in agreement I spent 25 minutes in total face-to-face time with the patient, more than 50% of which was spent in counseling and coordination of care, reviewing test results, reviewing medication and discussing or reviewing the diagnosis of gait d/o, secondary parkinsonism, its prognosis and treatment options. Pertinent laboratory and imaging test results that were available during this visit with the patient were reviewed by me and considered in my medical decision making (see chart for details).

## 2018-11-26 ENCOUNTER — Ambulatory Visit: Payer: Self-pay | Admitting: Neurology

## 2018-11-26 DIAGNOSIS — D2272 Melanocytic nevi of left lower limb, including hip: Secondary | ICD-10-CM | POA: Diagnosis not present

## 2018-11-26 DIAGNOSIS — D2371 Other benign neoplasm of skin of right lower limb, including hip: Secondary | ICD-10-CM | POA: Diagnosis not present

## 2018-11-26 DIAGNOSIS — D2262 Melanocytic nevi of left upper limb, including shoulder: Secondary | ICD-10-CM | POA: Diagnosis not present

## 2018-11-26 DIAGNOSIS — D1801 Hemangioma of skin and subcutaneous tissue: Secondary | ICD-10-CM | POA: Diagnosis not present

## 2018-11-26 DIAGNOSIS — B351 Tinea unguium: Secondary | ICD-10-CM | POA: Diagnosis not present

## 2018-11-26 DIAGNOSIS — D225 Melanocytic nevi of trunk: Secondary | ICD-10-CM | POA: Diagnosis not present

## 2018-11-26 DIAGNOSIS — Z85828 Personal history of other malignant neoplasm of skin: Secondary | ICD-10-CM | POA: Diagnosis not present

## 2018-11-26 DIAGNOSIS — L821 Other seborrheic keratosis: Secondary | ICD-10-CM | POA: Diagnosis not present

## 2018-11-26 DIAGNOSIS — L814 Other melanin hyperpigmentation: Secondary | ICD-10-CM | POA: Diagnosis not present

## 2018-11-26 DIAGNOSIS — D2239 Melanocytic nevi of other parts of face: Secondary | ICD-10-CM | POA: Diagnosis not present

## 2018-12-01 ENCOUNTER — Telehealth: Payer: Self-pay | Admitting: Neurology

## 2018-12-01 IMAGING — MR MR LUMBAR SPINE W/O CM
4 of 5 series · 27 of 48 positions shown · non-contrast
Comparison: MRI lumbar spine 01/27/2017.

CLINICAL DATA: Low back with LEFT leg pain.  Longstanding symptoms.

EXAM:
MRI LUMBAR SPINE WITHOUT CONTRAST
TECHNIQUE: Multiplanar, multisequence MR imaging of the lumbar spine was
performed. No intravenous contrast was administered.

[Series 2: T2 · sagittal · 4.0mm · 0.81mm/px · 6 of 15 slices shown (1 of 2)]
[im 1/15]
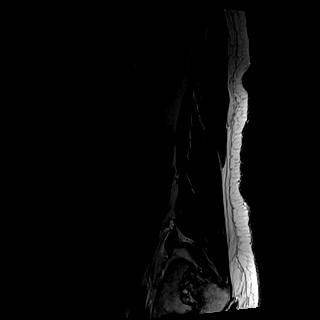
[im 3/15]
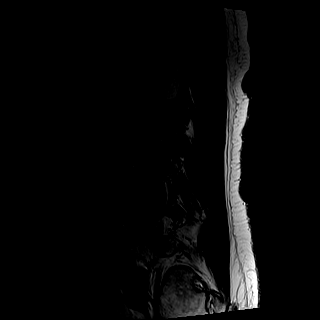
[im 6/15]
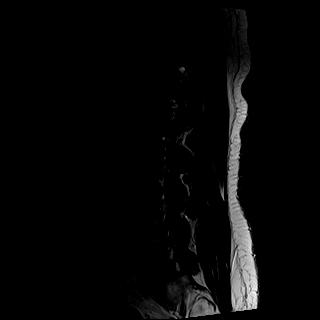
[im 9/15]
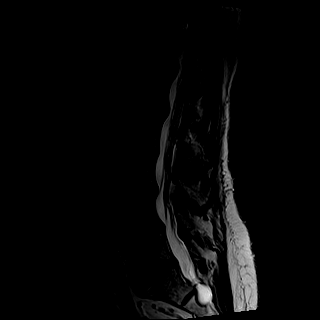
[im 12/15]
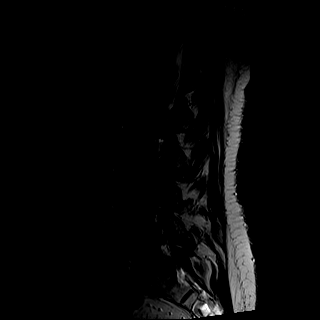
[im 15/15]
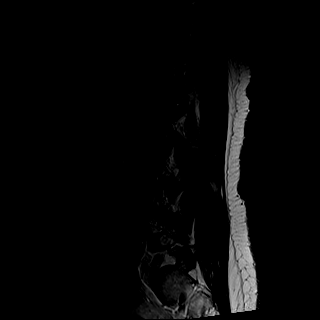

[Series 3: T1 · sagittal · 4.0mm · 0.41mm/px · 5 of 15 slices shown (1 of 2)]
[im 1/15]
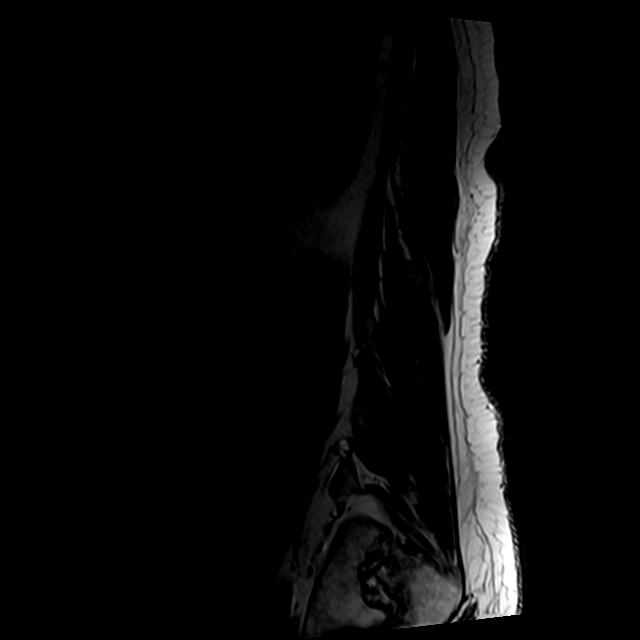
[im 4/15]
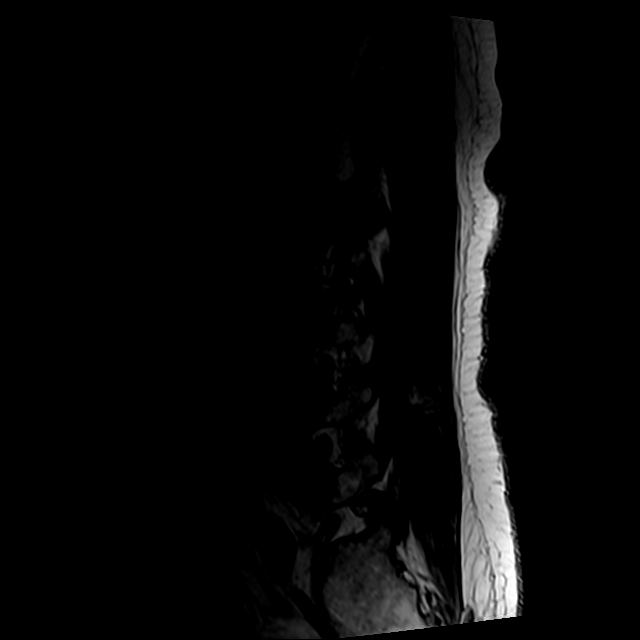
[im 8/15]
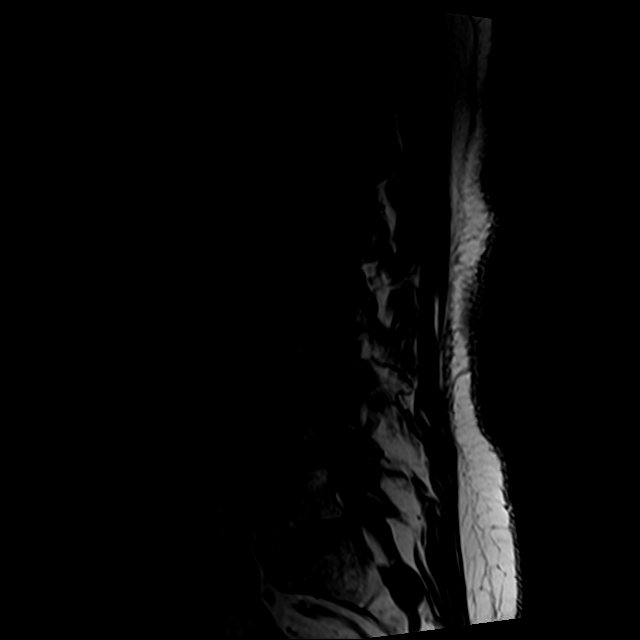
[im 11/15]
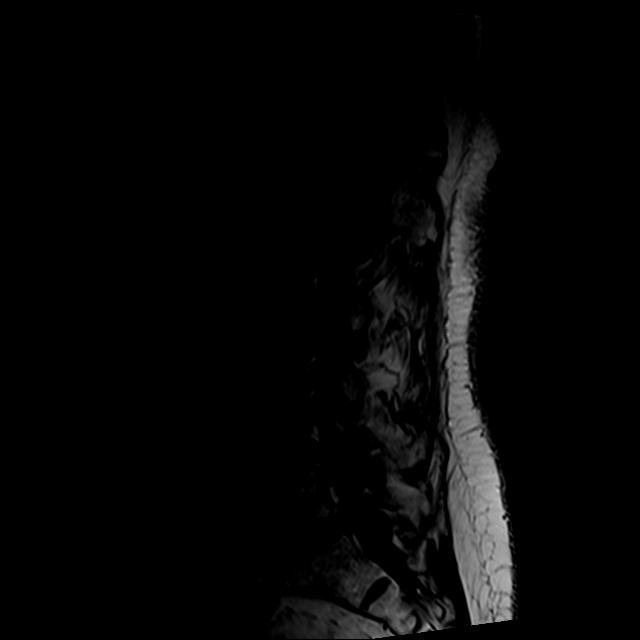
[im 15/15]
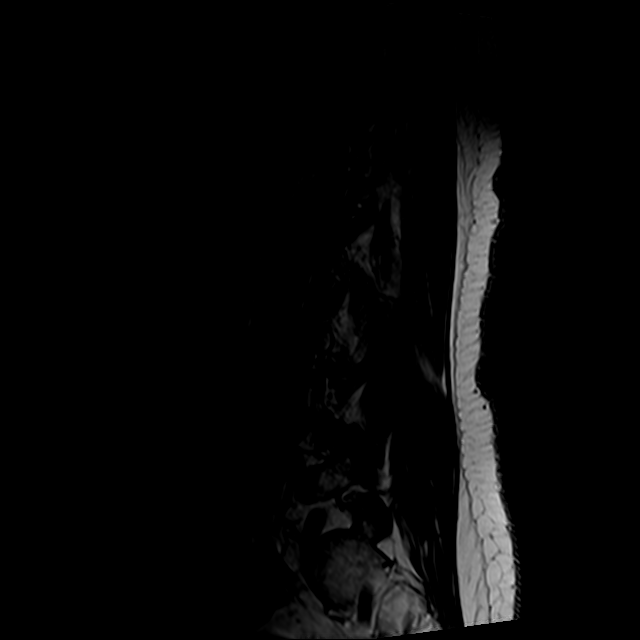

[Series 5: T2 · axial · 4.0mm · 0.78mm/px · z∈[-106,+128]mm · 10 of 46 slices shown (2 of 2)]
[im 4/46]
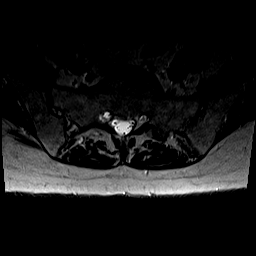
[im 7/46]
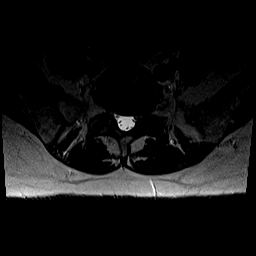
[im 10/46]
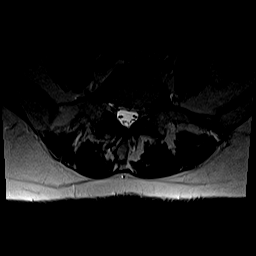
[im 16/46]
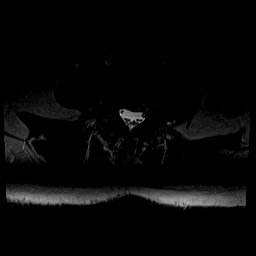
[im 22/46]
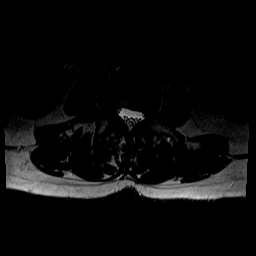
[im 25/46]
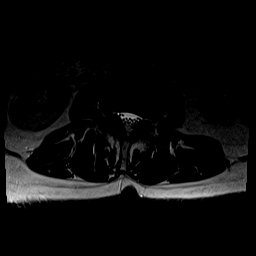
[im 28/46]
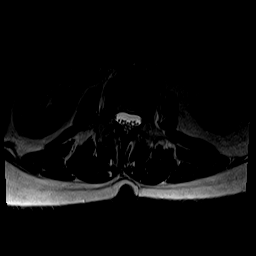
[im 34/46]
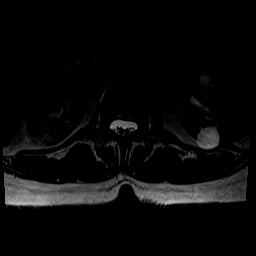
[im 40/46]
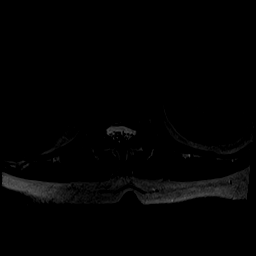
[im 46/46]
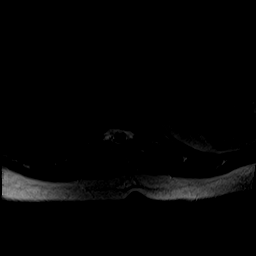

[Series 6: T1 · axial · 4.0mm · 0.39mm/px · z∈[-106,+98]mm · 6 of 46 slices shown (2 of 2)]
[im 4/46]
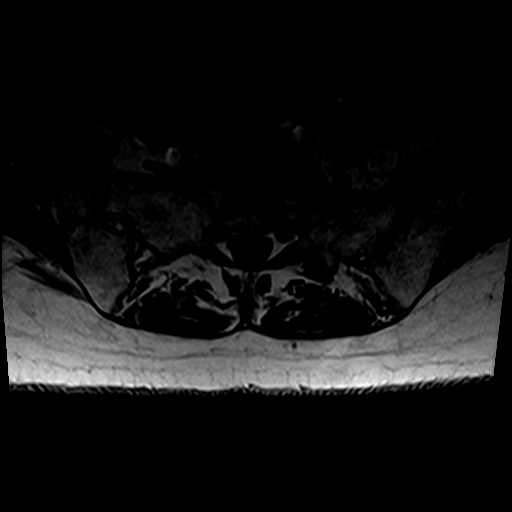
[im 7/46]
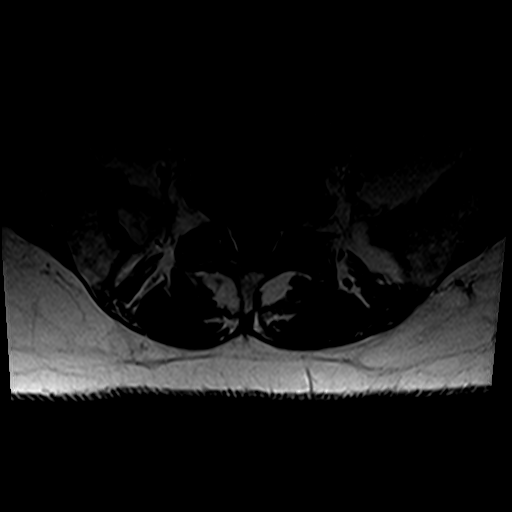
[im 10/46]
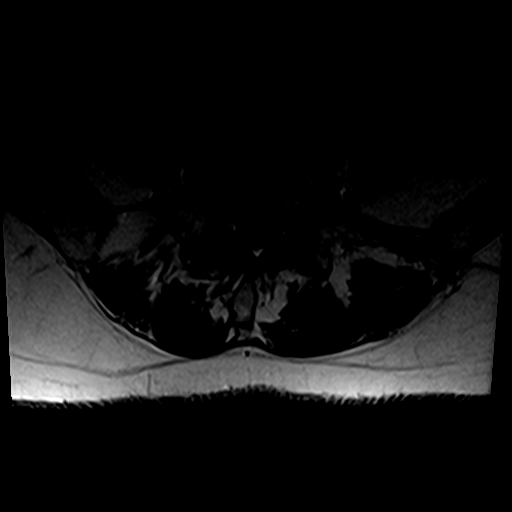
[im 16/46]
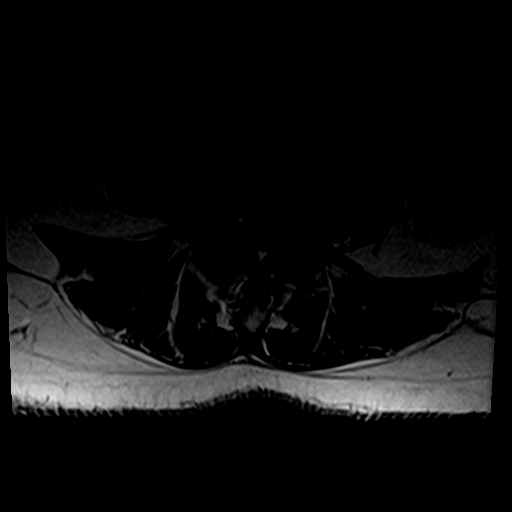
[im 25/46]
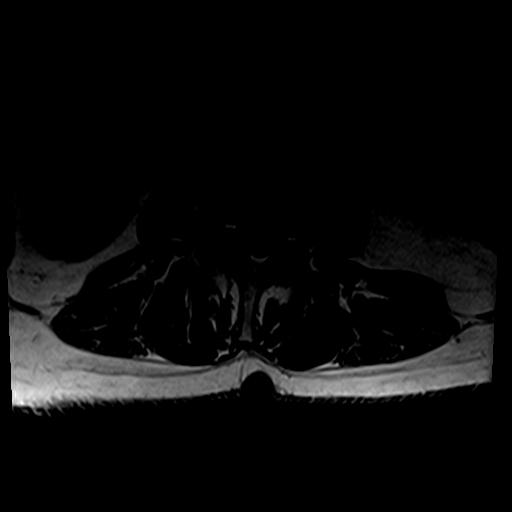
[im 40/46]
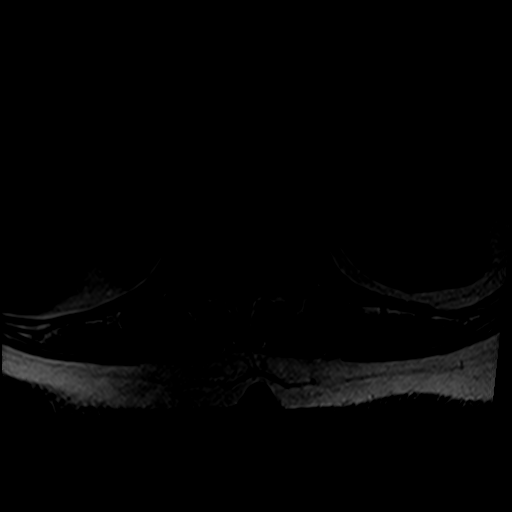

[27 of 48 positions shown; findings below may reference images not displayed]

FINDINGS: Segmentation:  Standard.

Alignment: 3 mm anterolisthesis L4-5, otherwise anatomic.
Degenerative scoliosis convex LEFT.

Vertebrae:  No worrisome osseous lesion.

Conus medullaris and cauda equina: Conus extends to the T12-L1
level. Conus and cauda equina appear normal. Mild distal thoracic
cord flattening from T11-12 disc pathology.

Paraspinal and other soft tissues: Renal cystic disease,
incompletely evaluated.

Disc levels:

T11-T12: Bilobed disc extrusion, LEFT much greater than RIGHT, is
redemonstrated. Correlate clinically for symptomatic LEFT T12 neural
impingement.

T12-L1: Rightward disc extrusion.  No definite impingement.

L1-L2:  Annular bulge.  Facet arthropathy.  No impingement.

L2-L3:  Annular bulge.  Facet arthropathy.  No impingement.

L3-L4: Annular bulge. Facet arthropathy. Asymmetric disc space
narrowing on the RIGHT. RIGHT L3 foraminal narrowing.

L4-L5: 3 mm anterolisthesis. Facet arthropathy worse on the RIGHT.
Annular bulge. Mild stenosis. Subarticular zone and foraminal zone
narrowing could affect both L4 and L5 nerve roots. Focal annular
rent on the LEFT, far-lateral, redemonstrated.

L5-S1: Shallow central protrusion. Facet arthropathy. No
impingement.

Compared with priors, similar appearance.
IMPRESSION: LEFT-sided neural impingement potentially symptomatic at multiple
levels including T11-12 disc extrusion, LEFT L4-5 far-lateral
annular rent, and LEFT S2 Tarlov cyst. Similar appearance to priors.

## 2018-12-01 NOTE — Telephone Encounter (Signed)
I don't remember seeing this form. Will ask MR to reach out to Raliegh Ip to get this form for Dr. Guadelupe Sabin review.

## 2018-12-01 NOTE — Telephone Encounter (Signed)
Pt's daughter Leafy Ro (on [place]) states that Raliegh Ip Dr. Mardelle Matte ortho is needing a form filled out for the pt to receive the surgery she is needing. Form was faxed according to the daughter on Feb the 5th. Please advise.

## 2018-12-15 NOTE — Telephone Encounter (Signed)
No neurological contraindication to proceed with left total hip replacement.

## 2018-12-15 NOTE — Telephone Encounter (Signed)
Received clearance request for pt's L total hip replacement, fax dated today.

## 2018-12-15 NOTE — Telephone Encounter (Signed)
Form faxed back to Raliegh Ip at [phone].

## 2018-12-29 NOTE — Patient Instructions (Signed)
ALLYAH HEATHER  12/29/2018   Your procedure is scheduled on: 01-12-19  Report to [facility] Main  Entrance              Report to  Short stay  at       0530 AM    Call this number if you have problems the morning of surgery [phone]    Remember: Do not eat food or drink liquids :After Midnight.  BRUSH YOUR TEETH MORNING OF SURGERY AND RINSE YOUR MOUTH OUT, NO CHEWING GUM CANDY OR MINTS.     Take these medicines the morning of surgery with A SIP OF WATER: Lexapro, zetia, amlodipine , tylenol                                You may not have any metal on your body including hair pins and              piercings  Do not wear jewelry, make-up, lotions, powders or perfumes, deodorant             Do not wear nail polish.  Do not shave  48 hours prior to surgery.     Do not bring valuables to the hospital. [place].  Contacts, dentures or bridgework may not be worn into surgery.  Leave suitcase in the car. After surgery it may be brought to your room.                 Please read over the following fact sheets you were given: _____________________________________________________________________           [facility] - Preparing for Surgery Before surgery, you can play an important role.  Because skin is not sterile, your skin needs to be as free of germs as possible.  You can reduce the number of germs on your skin by washing with CHG (chlorahexidine gluconate) soap before surgery.  CHG is an antiseptic cleaner which kills germs and bonds with the skin to continue killing germs even after washing. Please DO NOT use if you have an allergy to CHG or antibacterial soaps.  If your skin becomes reddened/irritated stop using the CHG and inform your nurse when you arrive at Short Stay. Do not shave (including legs and underarms) for at least 48 hours prior to the first CHG shower.  You may shave your face/neck. Please  follow these instructions carefully:  1.  Shower with CHG Soap the night before surgery and the  morning of Surgery.  2.  If you choose to wash your hair, wash your hair first as usual with your  normal  shampoo.  3.  After you shampoo, rinse your hair and body thoroughly to remove the  shampoo.                           4.  Use CHG as you would any other liquid soap.  You can apply chg directly  to the skin and wash                       Gently with a scrungie or clean washcloth.  5.  Apply the CHG Soap to  your body ONLY FROM THE NECK DOWN.   Do not use on face/ open                           Wound or open sores. Avoid contact with eyes, ears mouth and genitals (private parts).                       Wash face,  Genitals (private parts) with your normal soap.             6.  Wash thoroughly, paying special attention to the area where your surgery  will be performed.  7.  Thoroughly rinse your body with warm water from the neck down.  8.  DO NOT shower/wash with your normal soap after using and rinsing off  the CHG Soap.                9.  Pat yourself dry with a clean towel.            10.  Wear clean pajamas.            11.  Place clean sheets on your bed the night of your first shower and do not  sleep with pets. Day of Surgery : Do not apply any lotions/deodorants the morning of surgery.  Please wear clean clothes to the hospital/surgery center.  FAILURE TO FOLLOW THESE INSTRUCTIONS MAY RESULT IN THE CANCELLATION OF YOUR SURGERY PATIENT SIGNATURE_________________________________  NURSE SIGNATURE__________________________________  ________________________________________________________________________

## 2018-12-29 NOTE — Progress Notes (Signed)
Please place orders in epic pt. Has a preop scheduled !Thank You!

## 2018-12-31 ENCOUNTER — Encounter (HOSPITAL_COMMUNITY)
Admission: RE | Admit: 2018-12-31 | Discharge: 2018-12-31 | Disposition: A | Discharge status: Home / Self Care | Payer: Medicare Other | Source: Ambulatory Visit | Attending: Internal Medicine | Admitting: Internal Medicine

## 2019-01-12 ENCOUNTER — Inpatient Hospital Stay (HOSPITAL_COMMUNITY): Admission: RE | Admit: 2019-01-12 | Payer: Medicare Other | Source: Home / Self Care | Admitting: Orthopedic Surgery

## 2019-01-12 ENCOUNTER — Encounter (HOSPITAL_COMMUNITY): Admission: RE | Payer: Self-pay | Source: Home / Self Care

## 2019-01-12 SURGERY — ARTHROPLASTY, HIP, TOTAL,POSTERIOR APPROACH
Anesthesia: Choice | Laterality: Left

## 2019-02-12 DIAGNOSIS — I69351 Hemiplegia and hemiparesis following cerebral infarction affecting right dominant side: Secondary | ICD-10-CM | POA: Diagnosis not present

## 2019-02-12 DIAGNOSIS — Z5181 Encounter for therapeutic drug level monitoring: Secondary | ICD-10-CM | POA: Diagnosis not present

## 2019-02-12 DIAGNOSIS — Z9181 History of falling: Secondary | ICD-10-CM | POA: Diagnosis not present

## 2019-02-12 DIAGNOSIS — Z7901 Long term (current) use of anticoagulants: Secondary | ICD-10-CM | POA: Diagnosis not present

## 2019-02-12 DIAGNOSIS — D689 Coagulation defect, unspecified: Secondary | ICD-10-CM | POA: Diagnosis not present

## 2019-02-18 DIAGNOSIS — R2689 Other abnormalities of gait and mobility: Secondary | ICD-10-CM | POA: Diagnosis not present

## 2019-02-18 DIAGNOSIS — I10 Essential (primary) hypertension: Secondary | ICD-10-CM | POA: Diagnosis not present

## 2019-02-18 DIAGNOSIS — M25551 Pain in right hip: Secondary | ICD-10-CM | POA: Diagnosis not present

## 2019-02-18 DIAGNOSIS — I69959 Hemiplegia and hemiparesis following unspecified cerebrovascular disease affecting unspecified side: Secondary | ICD-10-CM | POA: Diagnosis not present

## 2019-02-18 DIAGNOSIS — Z7901 Long term (current) use of anticoagulants: Secondary | ICD-10-CM | POA: Diagnosis not present

## 2019-02-18 DIAGNOSIS — R7301 Impaired fasting glucose: Secondary | ICD-10-CM | POA: Diagnosis not present

## 2019-02-18 DIAGNOSIS — I69351 Hemiplegia and hemiparesis following cerebral infarction affecting right dominant side: Secondary | ICD-10-CM | POA: Diagnosis not present

## 2019-02-18 DIAGNOSIS — D689 Coagulation defect, unspecified: Secondary | ICD-10-CM | POA: Diagnosis not present

## 2019-02-18 DIAGNOSIS — Z9181 History of falling: Secondary | ICD-10-CM | POA: Diagnosis not present

## 2019-02-18 DIAGNOSIS — M545 Low back pain: Secondary | ICD-10-CM | POA: Diagnosis not present

## 2019-02-18 DIAGNOSIS — Z1331 Encounter for screening for depression: Secondary | ICD-10-CM | POA: Diagnosis not present

## 2019-02-18 DIAGNOSIS — Z5181 Encounter for therapeutic drug level monitoring: Secondary | ICD-10-CM | POA: Diagnosis not present

## 2019-03-03 DIAGNOSIS — Z5181 Encounter for therapeutic drug level monitoring: Secondary | ICD-10-CM | POA: Diagnosis not present

## 2019-03-03 DIAGNOSIS — D689 Coagulation defect, unspecified: Secondary | ICD-10-CM | POA: Diagnosis not present

## 2019-03-03 DIAGNOSIS — Z9181 History of falling: Secondary | ICD-10-CM | POA: Diagnosis not present

## 2019-03-03 DIAGNOSIS — Z7901 Long term (current) use of anticoagulants: Secondary | ICD-10-CM | POA: Diagnosis not present

## 2019-03-03 DIAGNOSIS — I69351 Hemiplegia and hemiparesis following cerebral infarction affecting right dominant side: Secondary | ICD-10-CM | POA: Diagnosis not present

## 2019-03-14 DIAGNOSIS — Z5181 Encounter for therapeutic drug level monitoring: Secondary | ICD-10-CM | POA: Diagnosis not present

## 2019-03-14 DIAGNOSIS — I69351 Hemiplegia and hemiparesis following cerebral infarction affecting right dominant side: Secondary | ICD-10-CM | POA: Diagnosis not present

## 2019-03-14 DIAGNOSIS — D689 Coagulation defect, unspecified: Secondary | ICD-10-CM | POA: Diagnosis not present

## 2019-03-14 DIAGNOSIS — Z7901 Long term (current) use of anticoagulants: Secondary | ICD-10-CM | POA: Diagnosis not present

## 2019-03-14 DIAGNOSIS — Z9181 History of falling: Secondary | ICD-10-CM | POA: Diagnosis not present

## 2019-03-16 DIAGNOSIS — I69351 Hemiplegia and hemiparesis following cerebral infarction affecting right dominant side: Secondary | ICD-10-CM | POA: Diagnosis not present

## 2019-03-16 DIAGNOSIS — Z9181 History of falling: Secondary | ICD-10-CM | POA: Diagnosis not present

## 2019-03-16 DIAGNOSIS — Z5181 Encounter for therapeutic drug level monitoring: Secondary | ICD-10-CM | POA: Diagnosis not present

## 2019-03-16 DIAGNOSIS — D689 Coagulation defect, unspecified: Secondary | ICD-10-CM | POA: Diagnosis not present

## 2019-03-16 DIAGNOSIS — Z7901 Long term (current) use of anticoagulants: Secondary | ICD-10-CM | POA: Diagnosis not present

## 2019-03-19 ENCOUNTER — Other Ambulatory Visit: Payer: Self-pay | Admitting: Orthopedic Surgery

## 2019-03-19 NOTE — Care Plan (Signed)
Lengthy conversation with patient and her son prior to surgery. She lives alone and independent currently. They plan to hire caregivers for her post op. 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 She has foot drop on her right. She has a balance disorder that is being followed by neuro. She uses a cane and a walker at present but may need something different. Will assess further needs with PT after surgery.  She has [facility] at home in now and would like to continue with them post op. She will transition to [facility] in [place] as appropriate.   Patient, family and MD aware of plan and agreeable. Choice was offered.    Ladell Heads, [place]

## 2019-03-22 NOTE — Patient Instructions (Addendum)
[name]     Your procedure is scheduled on: 03-30-2019   Report to [facility] Main  Entrance    Report to admitting at 1100 AM   [place] 19 TEST ON 6-12-20_ @ 10:00 AM, THIS TEST MUST BE DONE BEFORE SURGERY, COME TO [place]. ONCE YOUR COMPLETE YOUR COVID TEST, BEGIN THE QUARANTINE INSTRUCTIONS AS OUTLINED IN YOUR HANDOUT.   Call this number if you have problems the morning of surgery [phone]    Remember:. BRUSH YOUR TEETH MORNING OF SURGERY AND RINSE YOUR MOUTH OUT, NO CHEWING GUM CANDY OR MINTS.   NO SOLID FOOD AFTER MIDNIGHT THE NIGHT PRIOR TO SURGERY. NOTHING BY MOUTH EXCEPT CLEAR LIQUIDS UNTIL 730 AM. PLEASE FINISH G2  DRINK PER SURGEON ORDER  TIME WHICH NEEDS TO BE COMPLETED AT 430 AM.     CLEAR LIQUID DIET   Foods Allowed                                                                     Foods Excluded  Coffee and tea, regular and decaf                             liquids that you cannot  Plain Jell-O in any flavor                                             see through such as: Fruit ices (not with fruit pulp)                                     milk, soups, orange juice  Iced Popsicles                                    All solid food Carbonated beverages, regular and diet                                    Cranberry, grape and apple juices Sports drinks like Gatorade Lightly seasoned clear broth or consume(fat free) Sugar, honey syrup  Sample Menu Breakfast                                Lunch                                     Supper Cranberry juice                    Beef broth  Chicken broth Jell-O                                     Grape juice                           Apple juice Coffee or tea                        Jell-O                                      Popsicle                                                Coffee or tea                         Coffee or tea  _____________________________________________________________________    Take these medicines the morning of surgery with A SIP OF WATER: ESCITALOPRAM (LEXAPRO), AMLODIPINE (NORVASC)                               You may not have any metal on your body including hair pins and              piercings     Do not wear jewelry, make-up, lotions, powders or perfumes, deodorant             Do not wear nail polish.  Do not shave  48 hours prior to surgery.     Do not bring valuables to the hospital. [place].  Contacts, dentures or bridgework may not be worn into surgery.        _____________________________________________________________________             [facility] - Preparing for Surgery Before surgery, you can play an important role.  Because skin is not sterile, your skin needs to be as free of germs as possible.  You can reduce the number of germs on your skin by washing with CHG (chlorahexidine gluconate) soap before surgery.  CHG is an antiseptic cleaner which kills germs and bonds with the skin to continue killing germs even after washing. Please DO NOT use if you have an allergy to CHG or antibacterial soaps.  If your skin becomes reddened/irritated stop using the CHG and inform your nurse when you arrive at Short Stay. Do not shave (including legs and underarms) for at least 48 hours prior to the first CHG shower.  You may shave your face/neck. Please follow these instructions carefully:  1.  Shower with CHG Soap the night before surgery and the  morning of Surgery.  2.  If you choose to wash your hair, wash your hair first as usual with your  normal  shampoo.  3.  After you shampoo, rinse your hair and body thoroughly to remove the  shampoo.  4.  Use CHG as you would any other liquid soap.  You can apply chg directly  to the skin and wash                       Gently with a  scrungie or clean washcloth.  5.  Apply the CHG Soap to your body ONLY FROM THE NECK DOWN.   Do not use on face/ open                           Wound or open sores. Avoid contact with eyes, ears mouth and genitals (private parts).                       Wash face,  Genitals (private parts) with your normal soap.             6.  Wash thoroughly, paying special attention to the area where your surgery  will be performed.  7.  Thoroughly rinse your body with warm water from the neck down.  8.  DO NOT shower/wash with your normal soap after using and rinsing off  the CHG Soap.                9.  Pat yourself dry with a clean towel.            10.  Wear clean pajamas.            11.  Place clean sheets on your bed the night of your first shower and do not  sleep with pets. Day of Surgery : Do not apply any lotions/deodorants the morning of surgery.  Please wear clean clothes to the hospital/surgery center.  FAILURE TO FOLLOW THESE INSTRUCTIONS MAY RESULT IN THE CANCELLATION OF YOUR SURGERY PATIENT SIGNATURE_________________________________  NURSE SIGNATURE__________________________________  ________________________________________________________________________   Adam Phenix  An incentive spirometer is a tool that can help keep your lungs clear and active. This tool measures how well you are filling your lungs with each breath. Taking long deep breaths may help reverse or decrease the chance of developing breathing (pulmonary) problems (especially infection) following:  A long period of time when you are unable to move or be active. BEFORE THE PROCEDURE   If the spirometer includes an indicator to show your best effort, your nurse or respiratory therapist will set it to a desired goal.  If possible, sit up straight or lean slightly forward. Try not to slouch.  Hold the incentive spirometer in an upright position. INSTRUCTIONS FOR USE  1. Sit on the edge of your bed if possible,  or sit up as far as you can in bed or on a chair. 2. Hold the incentive spirometer in an upright position. 3. Breathe out normally. 4. Place the mouthpiece in your mouth and seal your lips tightly around it. 5. Breathe in slowly and as deeply as possible, raising the piston or the ball toward the top of the column. 6. Hold your breath for 3-5 seconds or for as long as possible. Allow the piston or ball to fall to the bottom of the column. 7. Remove the mouthpiece from your mouth and breathe out normally. 8. Rest for a few seconds and repeat Steps 1 through 7 at least 10 times every 1-2 hours when you are awake. Take your time and take a few normal breaths between deep breaths. 9. The spirometer may include an indicator to  show your best effort. Use the indicator as a goal to work toward during each repetition. 10. After each set of 10 deep breaths, practice coughing to be sure your lungs are clear. If you have an incision (the cut made at the time of surgery), support your incision when coughing by placing a pillow or rolled up towels firmly against it. Once you are able to get out of bed, walk around indoors and cough well. You may stop using the incentive spirometer when instructed by your caregiver.  RISKS AND COMPLICATIONS  Take your time so you do not get dizzy or light-headed.  If you are in pain, you may need to take or ask for pain medication before doing incentive spirometry. It is harder to take a deep breath if you are having pain. AFTER USE  Rest and breathe slowly and easily.  It can be helpful to keep track of a log of your progress. Your caregiver can provide you with a simple table to help with this. If you are using the spirometer at home, follow these instructions: [place] IF:   You are having difficultly using the spirometer.  You have trouble using the spirometer as often as instructed.  Your pain medication is not giving enough relief while using the  spirometer.  You develop fever of 100.5 F (38.1 C) or higher. SEEK IMMEDIATE MEDICAL CARE IF:   You cough up bloody sputum that had not been present before.  You develop fever of 102 F (38.9 C) or greater.  You develop worsening pain at or near the incision site. MAKE SURE YOU:   Understand these instructions.  Will watch your condition.  Will get help right away if you are not doing well or get worse. Document Released: 02/10/2007 Document Revised: 12/23/2011 Document Reviewed: 04/13/2007 [facility] Patient Information 2014 [place], [place].   ________________________________________________________________________

## 2019-03-23 ENCOUNTER — Other Ambulatory Visit: Payer: Self-pay

## 2019-03-23 ENCOUNTER — Encounter (HOSPITAL_COMMUNITY)
Admission: RE | Admit: 2019-03-23 | Discharge: 2019-03-23 | Disposition: A | Discharge status: Home / Self Care | Payer: Medicare Other | Source: Ambulatory Visit | Attending: Orthopedic Surgery | Admitting: Orthopedic Surgery

## 2019-03-23 ENCOUNTER — Encounter (HOSPITAL_COMMUNITY): Payer: Self-pay

## 2019-03-23 DIAGNOSIS — Z1159 Encounter for screening for other viral diseases: Secondary | ICD-10-CM | POA: Insufficient documentation

## 2019-03-23 DIAGNOSIS — Z01818 Encounter for other preprocedural examination: Secondary | ICD-10-CM | POA: Insufficient documentation

## 2019-03-23 DIAGNOSIS — I10 Essential (primary) hypertension: Secondary | ICD-10-CM | POA: Insufficient documentation

## 2019-03-23 DIAGNOSIS — M1612 Unilateral primary osteoarthritis, left hip: Secondary | ICD-10-CM | POA: Insufficient documentation

## 2019-03-23 HISTORY — DX: Activated protein C resistance: D68.51

## 2019-03-23 LAB — CBC
HCT: 43.9 % (ref 36.0–46.0)
Hemoglobin: 13.8 g/dL (ref 12.0–15.0)
MCH: 31.7 pg (ref 26.0–34.0)
MCHC: 31.4 g/dL (ref 30.0–36.0)
MCV: 100.9 fL — ABNORMAL HIGH (ref 80.0–100.0)
Platelets: 254 10*3/uL (ref 150–400)
RBC: 4.35 MIL/uL (ref 3.87–5.11)
RDW: 12.7 % (ref 11.5–15.5)
WBC: 10.3 10*3/uL (ref 4.0–10.5)
nRBC: 0 % (ref 0.0–0.2)

## 2019-03-23 LAB — SURGICAL PCR SCREEN
MRSA, PCR: NEGATIVE
Staphylococcus aureus: NEGATIVE

## 2019-03-23 LAB — BASIC METABOLIC PANEL
Anion gap: 9 (ref 5–15)
BUN: 21 mg/dL (ref 8–23)
CO2: 24 mmol/L (ref 22–32)
Calcium: 9.6 mg/dL (ref 8.9–10.3)
Chloride: 107 mmol/L (ref 98–111)
Creatinine, Ser: 0.79 mg/dL (ref 0.44–1.00)
GFR calc Af Amer: >60 mL/min (ref >60)
GFR calc non Af Amer: >60 mL/min (ref >60)
Glucose, Bld: 97 mg/dL (ref 70–99)
Potassium: 4.8 mmol/L (ref 3.5–5.1)
Sodium: 140 mmol/L (ref 135–145)

## 2019-03-23 LAB — HEMOGLOBIN A1C
Hgb A1c MFr Bld: 5.6 % (ref 4.8–5.6)
Mean Plasma Glucose: 114.02 mg/dL

## 2019-03-23 LAB — GLUCOSE, CAPILLARY: Glucose-Capillary: 100 mg/dL — ABNORMAL HIGH (ref 70–99)

## 2019-03-24 NOTE — Progress Notes (Signed)
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   Followed by neurologist for gradual worsening of her gait, also intermittent tremors, balance issues.  Per neurologist, Dr. [name], "No neurological contraindication to proceed with left total hip replacement."  Pt advised by PCP to hold Coumadin and start Lovenox bridge prior to surgery.  Instructions given to patient, on chart.   Anticipate pt can proceed with planned procedure barring acute status change.  VS: BP 112/62   Pulse 85   Temp 36.6 C (Oral)   Resp 16   Ht 5\' 4"  (1.626 m)   Wt 75.4 kg   SpO2 96%   BMI 28.55 kg/m   PROVIDERS: Crist Infante, MD is PCP    LABS: Labs reviewed: Acceptable for surgery. (all labs ordered are listed, but only abnormal results are displayed)  Labs Reviewed  CBC - Abnormal; Notable for the following components:      Result Value   MCV 100.9 (*)    All other components within normal limits  GLUCOSE, CAPILLARY - Abnormal; Notable for the following components:   Glucose-Capillary 100 (*)    All other components within normal limits  SURGICAL PCR SCREEN  BASIC METABOLIC PANEL  HEMOGLOBIN A1C     IMAGES:   EKG: 03/23/2019 Rate 74 bpm Normal sinus rhythm  Low voltage QRS   CV: Echo 09/05/2009 Study Conclusions  1. Left ventricle: Systolic function was normal. The estimated  ejection fraction was in the range of 55% to 60%. Wall motion was  normal; there were no regional wall motion abnormalities. 2. Mitral  valve: Mild regurgitation. 3. Left atrium: The atrium was mildly dilated. No evidence of  thrombus in the atrial cavity or appendage. 4. Atrial septum: Echo contrast study showed no right-to-left atrial  level shunt, at baseline or with provocation. Past Medical History:  Diagnosis Date  . Allergic rhinitis   . Clotting disorder ([place])    Factor 5  . Diabetes mellitus without complication ([place])    no meds-diet controlled  . Factor 5 Leiden mutation, heterozygous ([place])   . Heart murmur    mild mitral regurg-echo 2010  . Hyperlipidemia   . Hypertension   . Lupus anticoagulant positive   . Obesity   . Stroke [facility]) 2010    Past Surgical History:  Procedure Laterality Date  . ABDOMINAL HYSTERECTOMY  1981  . APPENDECTOMY    . AXILLARY LYMPH NODE BIOPSY Left 05/16/2014   Procedure: EXCISION 2CM LEFT AXILLARY MASS;  Surgeon: [name], MD;  Location: [place];  Service: General;  Laterality: Left;  Left axillary sebaceous cyst excision  . DILATION AND CURETTAGE OF UTERUS    . SKIN CANCER EXCISION  2016   [place]    MEDICATIONS: . acetaminophen (TYLENOL) 650 MG CR tablet  . amLODipine (NORVASC) 5 MG tablet  . aspirin 81 MG tablet  . B Complex Vitamins (B COMPLEX PO)  . BIOTIN PO  . CALCIUM PO  . cholecalciferol (VITAMIN D)  1000 UNITS tablet  . diclofenac sodium (VOLTAREN) 1 % GEL  . escitalopram (LEXAPRO) 10 MG tablet  . ezetimibe (ZETIA) 10 MG tablet  . fluticasone (FLONASE) 50 MCG/ACT nasal spray  . lisinopril (PRINIVIL,ZESTRIL) 10 MG tablet  . Magnesium 250 MG TABS  . simvastatin (ZOCOR) 20 MG tablet  . vitamin B-12 (CYANOCOBALAMIN) 1000 MCG tablet  . warfarin (COUMADIN) 5 MG tablet   No current facility-administered medications for this encounter.     Maia [facility] Pre-Surgical Testing [phone] 03/24/19 3:05 PM

## 2019-03-24 NOTE — Anesthesia Preprocedure Evaluation (Addendum)
Anesthesia Evaluation  Patient identified by MRN, date of birth, ID band Patient awake    Reviewed: Allergy & Precautions, NPO status , Patient's Chart, lab work & pertinent test results  Airway Mallampati: II  TM Distance: >3 FB Neck ROM: Full    Dental no notable dental hx. (+) Partial Upper, Partial Lower   Pulmonary former smoker,    Pulmonary exam normal breath sounds clear to auscultation       Cardiovascular hypertension, Pt. on medications Normal cardiovascular exam Rhythm:Regular Rate:Normal     Neuro/Psych CVA negative psych ROS   GI/Hepatic negative GI ROS, Neg liver ROS,   Endo/Other  diabetes, Type 2  Renal/GU negative Renal ROS  negative genitourinary   Musculoskeletal negative musculoskeletal ROS (+)   Abdominal   Peds negative pediatric ROS (+)  Hematology negative hematology ROS (+) Blood dyscrasia (Factor 5 Leiden), ,   Anesthesia Other Findings   Reproductive/Obstetrics negative OB ROS                            Anesthesia Physical Anesthesia Plan  ASA: III  Anesthesia Plan: General   Post-op Pain Management:    Induction: Intravenous  PONV Risk Score and Plan: 3 and Ondansetron and Treatment may vary due to age or medical condition  Airway Management Planned: Oral ETT  Additional Equipment:   Intra-op Plan:   Post-operative Plan: Extubation in OR  Informed Consent: I have reviewed the patients History and Physical, chart, labs and discussed the procedure including the risks, benefits and alternatives for the proposed anesthesia with the patient or authorized representative who has indicated his/her understanding and acceptance.     Dental advisory given  Plan Discussed with: CRNA  Anesthesia Plan Comments: (See PAT note 03/23/2019, Konrad Felix, PA-C  On BID lovenox, last dose was 8pm. Not a candidate for spinal)       Anesthesia Quick  Evaluation

## 2019-03-26 ENCOUNTER — Other Ambulatory Visit (HOSPITAL_COMMUNITY)
Admission: RE | Admit: 2019-03-26 | Discharge: 2019-03-26 | Disposition: A | Discharge status: Home / Self Care | Payer: Medicare Other | Source: Ambulatory Visit | Attending: Orthopedic Surgery | Admitting: Orthopedic Surgery

## 2019-03-26 DIAGNOSIS — Z01818 Encounter for other preprocedural examination: Secondary | ICD-10-CM | POA: Diagnosis not present

## 2019-03-26 DIAGNOSIS — Z1159 Encounter for screening for other viral diseases: Secondary | ICD-10-CM | POA: Diagnosis not present

## 2019-03-26 DIAGNOSIS — I10 Essential (primary) hypertension: Secondary | ICD-10-CM | POA: Diagnosis not present

## 2019-03-26 DIAGNOSIS — M1612 Unilateral primary osteoarthritis, left hip: Secondary | ICD-10-CM | POA: Diagnosis not present

## 2019-03-27 LAB — NOVEL CORONAVIRUS, NAA (HOSP ORDER, SEND-OUT TO REF LAB; TAT 18-24 HRS): SARS-CoV-2, NAA: NOT DETECTED

## 2019-03-29 NOTE — Progress Notes (Signed)
SPOKE W/  Mckinley Jewel via phone and reviewed the following questions:   SCREENING SYMPTOMS OF COVID 19:  COUGH--no  RUNNY NOSE--- no  SORE THROAT---no  NASAL CONGESTION----no  SNEEZING----no  SHORTNESS OF BREATH---no  DIFFICULTY BREATHING---no  TEMP >100.0 -----no  UNEXPLAINED BODY ACHES------no  CHILLS -------- no  HEADACHES ---------no  LOSS OF SMELL/ TASTE --------no   HAVE YOU OR ANY FAMILY MEMBER TRAVELLED PAST 14 DAYS OUT OF THE   COUNTY---no STATE----no COUNTRY----no  HAVE YOU OR ANY FAMILY MEMBER BEEN EXPOSED TO ANYONE WITH COVID 19?   no

## 2019-03-30 ENCOUNTER — Inpatient Hospital Stay (HOSPITAL_COMMUNITY)
Admission: RE | Admit: 2019-03-30 | Discharge: 2019-04-02 | DRG: 470 | Disposition: A | Discharge status: Home Health | Payer: Medicare Other | Attending: Orthopedic Surgery | Admitting: Orthopedic Surgery

## 2019-03-30 ENCOUNTER — Other Ambulatory Visit: Payer: Self-pay

## 2019-03-30 ENCOUNTER — Observation Stay (HOSPITAL_COMMUNITY): Payer: Medicare Other

## 2019-03-30 ENCOUNTER — Encounter (HOSPITAL_COMMUNITY)
Admission: RE | Disposition: A | Discharge status: Home Health | Payer: Self-pay | Source: Home / Self Care | Attending: Orthopedic Surgery

## 2019-03-30 ENCOUNTER — Inpatient Hospital Stay (HOSPITAL_COMMUNITY): Payer: Medicare Other | Admitting: Physician Assistant

## 2019-03-30 ENCOUNTER — Inpatient Hospital Stay (HOSPITAL_COMMUNITY): Payer: Medicare Other | Admitting: Anesthesiology

## 2019-03-30 ENCOUNTER — Encounter (HOSPITAL_COMMUNITY): Payer: Self-pay | Admitting: Orthopedic Surgery

## 2019-03-30 DIAGNOSIS — E119 Type 2 diabetes mellitus without complications: Secondary | ICD-10-CM | POA: Diagnosis present

## 2019-03-30 DIAGNOSIS — Z825 Family history of asthma and other chronic lower respiratory diseases: Secondary | ICD-10-CM

## 2019-03-30 DIAGNOSIS — M1612 Unilateral primary osteoarthritis, left hip: Secondary | ICD-10-CM | POA: Diagnosis not present

## 2019-03-30 DIAGNOSIS — Z8249 Family history of ischemic heart disease and other diseases of the circulatory system: Secondary | ICD-10-CM

## 2019-03-30 DIAGNOSIS — Z7901 Long term (current) use of anticoagulants: Secondary | ICD-10-CM

## 2019-03-30 DIAGNOSIS — E785 Hyperlipidemia, unspecified: Secondary | ICD-10-CM | POA: Diagnosis present

## 2019-03-30 DIAGNOSIS — Z7902 Long term (current) use of antithrombotics/antiplatelets: Secondary | ICD-10-CM | POA: Diagnosis not present

## 2019-03-30 DIAGNOSIS — Z9181 History of falling: Secondary | ICD-10-CM

## 2019-03-30 DIAGNOSIS — M25752 Osteophyte, left hip: Secondary | ICD-10-CM | POA: Diagnosis present

## 2019-03-30 DIAGNOSIS — Z832 Family history of diseases of the blood and blood-forming organs and certain disorders involving the immune mechanism: Secondary | ICD-10-CM

## 2019-03-30 DIAGNOSIS — E669 Obesity, unspecified: Secondary | ICD-10-CM | POA: Diagnosis present

## 2019-03-30 DIAGNOSIS — Z8673 Personal history of transient ischemic attack (TIA), and cerebral infarction without residual deficits: Secondary | ICD-10-CM

## 2019-03-30 DIAGNOSIS — D6862 Lupus anticoagulant syndrome: Secondary | ICD-10-CM | POA: Diagnosis present

## 2019-03-30 DIAGNOSIS — R55 Syncope and collapse: Secondary | ICD-10-CM | POA: Diagnosis not present

## 2019-03-30 DIAGNOSIS — I10 Essential (primary) hypertension: Secondary | ICD-10-CM | POA: Diagnosis present

## 2019-03-30 DIAGNOSIS — Z87891 Personal history of nicotine dependence: Secondary | ICD-10-CM

## 2019-03-30 DIAGNOSIS — M1611 Unilateral primary osteoarthritis, right hip: Secondary | ICD-10-CM | POA: Diagnosis not present

## 2019-03-30 DIAGNOSIS — Z885 Allergy status to narcotic agent status: Secondary | ICD-10-CM

## 2019-03-30 DIAGNOSIS — D62 Acute posthemorrhagic anemia: Secondary | ICD-10-CM | POA: Diagnosis not present

## 2019-03-30 DIAGNOSIS — Z841 Family history of disorders of kidney and ureter: Secondary | ICD-10-CM

## 2019-03-30 DIAGNOSIS — Z96642 Presence of left artificial hip joint: Secondary | ICD-10-CM

## 2019-03-30 DIAGNOSIS — Z96649 Presence of unspecified artificial hip joint: Secondary | ICD-10-CM

## 2019-03-30 DIAGNOSIS — I639 Cerebral infarction, unspecified: Secondary | ICD-10-CM | POA: Diagnosis not present

## 2019-03-30 DIAGNOSIS — Z79899 Other long term (current) drug therapy: Secondary | ICD-10-CM

## 2019-03-30 DIAGNOSIS — D6851 Activated protein C resistance: Secondary | ICD-10-CM | POA: Diagnosis not present

## 2019-03-30 DIAGNOSIS — Z471 Aftercare following joint replacement surgery: Secondary | ICD-10-CM | POA: Diagnosis not present

## 2019-03-30 DIAGNOSIS — Z833 Family history of diabetes mellitus: Secondary | ICD-10-CM

## 2019-03-30 DIAGNOSIS — Z7982 Long term (current) use of aspirin: Secondary | ICD-10-CM

## 2019-03-30 HISTORY — PX: TOTAL HIP ARTHROPLASTY: SHX124

## 2019-03-30 HISTORY — DX: Unilateral primary osteoarthritis, left hip: M16.12

## 2019-03-30 LAB — PROTIME-INR
INR: 0.9 (ref 0.8–1.2)
Prothrombin Time: 12.4 seconds (ref 11.4–15.2)

## 2019-03-30 LAB — GLUCOSE, CAPILLARY: Glucose-Capillary: 122 mg/dL — ABNORMAL HIGH (ref 70–99)

## 2019-03-30 SURGERY — ARTHROPLASTY, HIP, TOTAL,POSTERIOR APPROACH
Anesthesia: General | Site: Hip | Laterality: Left

## 2019-03-30 MED ORDER — MAGNESIUM 250 MG PO TABS: 250.0000 mg | ORAL_TABLET | Freq: Every day | ORAL | Status: DC

## 2019-03-30 MED ORDER — CEFAZOLIN SODIUM-DEXTROSE 2-4 GM/100ML-% IV SOLN
2.0000 g | INTRAVENOUS | Status: AC
  Administered 2019-03-30: 2 g via INTRAVENOUS
  Filled 2019-03-30: qty 100

## 2019-03-30 MED ORDER — LISINOPRIL 10 MG PO TABS
10.0000 mg | ORAL_TABLET | Freq: Two times a day (BID) | ORAL | Status: DC
  Administered 2019-03-31 – 2019-04-01 (×5): 10 mg via ORAL
  Filled 2019-03-30 (×6): qty 1

## 2019-03-30 MED ORDER — ESCITALOPRAM OXALATE 10 MG PO TABS
10.0000 mg | ORAL_TABLET | Freq: Every day | ORAL | Status: DC
  Administered 2019-03-31 – 2019-04-02 (×3): 10 mg via ORAL
  Filled 2019-03-30 (×3): qty 1

## 2019-03-30 MED ORDER — METHOCARBAMOL 500 MG IVPB - SIMPLE MED
500.0000 mg | Freq: Four times a day (QID) | INTRAVENOUS | Status: DC | PRN
  Administered 2019-03-30: 500 mg via INTRAVENOUS
  Filled 2019-03-30: qty 50

## 2019-03-30 MED ORDER — ONDANSETRON HCL 4 MG/2ML IJ SOLN
INTRAMUSCULAR | Status: DC | PRN
  Administered 2019-03-30: 4 mg via INTRAVENOUS

## 2019-03-30 MED ORDER — SUGAMMADEX SODIUM 200 MG/2ML IV SOLN
INTRAVENOUS | Status: DC | PRN
  Administered 2019-03-30: 150.8 mg via INTRAVENOUS

## 2019-03-30 MED ORDER — PROPOFOL 10 MG/ML IV BOLUS
INTRAVENOUS | Status: DC | PRN
  Administered 2019-03-30: 100 mg via INTRAVENOUS
  Administered 2019-03-30: 30 mg via INTRAVENOUS

## 2019-03-30 MED ORDER — METHOCARBAMOL 500 MG IVPB - SIMPLE MED
INTRAVENOUS | Status: AC
  Filled 2019-03-30: qty 50

## 2019-03-30 MED ORDER — METOCLOPRAMIDE HCL 5 MG/ML IJ SOLN: 10.0000 mg | Freq: Once | INTRAMUSCULAR | Status: DC | PRN

## 2019-03-30 MED ORDER — ROCURONIUM BROMIDE 10 MG/ML (PF) SYRINGE
PREFILLED_SYRINGE | INTRAVENOUS | Status: AC
  Filled 2019-03-30: qty 10

## 2019-03-30 MED ORDER — MORPHINE SULFATE (PF) 2 MG/ML IV SOLN: 0.5000 mg | INTRAVENOUS | Status: DC | PRN

## 2019-03-30 MED ORDER — MEPERIDINE HCL 50 MG/ML IJ SOLN: 6.2500 mg | INTRAMUSCULAR | Status: DC | PRN

## 2019-03-30 MED ORDER — ENOXAPARIN SODIUM 80 MG/0.8ML ~~LOC~~ SOLN
75.0000 mg | Freq: Two times a day (BID) | SUBCUTANEOUS | Status: DC
  Administered 2019-03-30 – 2019-04-02 (×6): 75 mg via SUBCUTANEOUS
  Filled 2019-03-30 (×7): qty 0.75

## 2019-03-30 MED ORDER — ALUM & MAG HYDROXIDE-SIMETH 200-200-20 MG/5ML PO SUSP: 30.0000 mL | ORAL | Status: DC | PRN

## 2019-03-30 MED ORDER — ENOXAPARIN SODIUM 80 MG/0.8ML ~~LOC~~ SOLN: 40.0000 mg | Freq: Two times a day (BID) | SUBCUTANEOUS | Status: DC

## 2019-03-30 MED ORDER — EPHEDRINE SULFATE-NACL 50-0.9 MG/10ML-% IV SOSY
PREFILLED_SYRINGE | INTRAVENOUS | Status: DC | PRN
  Administered 2019-03-30 (×4): 10 mg via INTRAVENOUS

## 2019-03-30 MED ORDER — HYDROCODONE-ACETAMINOPHEN 5-325 MG PO TABS
1.0000 | ORAL_TABLET | ORAL | Status: DC | PRN
  Administered 2019-03-30 – 2019-04-01 (×2): 1 via ORAL
  Filled 2019-03-30 (×2): qty 1

## 2019-03-30 MED ORDER — MAGNESIUM OXIDE 400 (241.3 MG) MG PO TABS
200.0000 mg | ORAL_TABLET | Freq: Every day | ORAL | Status: DC
  Administered 2019-03-31 – 2019-04-02 (×3): 200 mg via ORAL
  Filled 2019-03-30 (×3): qty 1

## 2019-03-30 MED ORDER — 0.9 % SODIUM CHLORIDE (POUR BTL) OPTIME
TOPICAL | Status: DC | PRN
  Administered 2019-03-30: 1000 mL

## 2019-03-30 MED ORDER — PROPOFOL 10 MG/ML IV BOLUS
INTRAVENOUS | Status: AC
  Filled 2019-03-30: qty 20

## 2019-03-30 MED ORDER — EZETIMIBE 10 MG PO TABS
5.0000 mg | ORAL_TABLET | ORAL | Status: DC
  Administered 2019-03-31 – 2019-04-02 (×2): 5 mg via ORAL
  Filled 2019-03-30: qty 0.5
  Filled 2019-03-30: qty 1

## 2019-03-30 MED ORDER — WARFARIN SODIUM 7.5 MG PO TABS
7.5000 mg | ORAL_TABLET | Freq: Once | ORAL | Status: AC
  Administered 2019-03-30: 7.5 mg via ORAL
  Filled 2019-03-30: qty 1

## 2019-03-30 MED ORDER — VITAMIN B-12 1000 MCG PO TABS
1000.0000 ug | ORAL_TABLET | Freq: Every day | ORAL | Status: DC
  Administered 2019-03-31 – 2019-04-02 (×3): 1000 ug via ORAL
  Filled 2019-03-30 (×3): qty 1

## 2019-03-30 MED ORDER — EPHEDRINE 5 MG/ML INJ
INTRAVENOUS | Status: AC
  Filled 2019-03-30: qty 10

## 2019-03-30 MED ORDER — LIDOCAINE 2% (20 MG/ML) 5 ML SYRINGE
INTRAMUSCULAR | Status: AC
  Filled 2019-03-30: qty 5

## 2019-03-30 MED ORDER — KETOROLAC TROMETHAMINE 30 MG/ML IJ SOLN
INTRAMUSCULAR | Status: DC | PRN
  Administered 2019-03-30: 30 mg via INTRAMUSCULAR

## 2019-03-30 MED ORDER — DEXAMETHASONE SODIUM PHOSPHATE 10 MG/ML IJ SOLN
INTRAMUSCULAR | Status: AC
  Filled 2019-03-30: qty 1

## 2019-03-30 MED ORDER — ACETAMINOPHEN 500 MG PO TABS
1000.0000 mg | ORAL_TABLET | Freq: Once | ORAL | Status: AC
  Administered 2019-03-30: 1000 mg via ORAL
  Filled 2019-03-30: qty 2

## 2019-03-30 MED ORDER — DEXAMETHASONE SODIUM PHOSPHATE 10 MG/ML IJ SOLN
10.0000 mg | Freq: Once | INTRAMUSCULAR | Status: AC
  Administered 2019-03-31: 10 mg via INTRAVENOUS
  Filled 2019-03-30: qty 1

## 2019-03-30 MED ORDER — DEXAMETHASONE SODIUM PHOSPHATE 10 MG/ML IJ SOLN
INTRAMUSCULAR | Status: DC | PRN
  Administered 2019-03-30: 10 mg via INTRAVENOUS

## 2019-03-30 MED ORDER — SODIUM CHLORIDE (PF) 0.9 % IJ SOLN
INTRAMUSCULAR | Status: AC
  Filled 2019-03-30: qty 10

## 2019-03-30 MED ORDER — POVIDONE-IODINE 10 % EX SWAB
2.0000 "application " | Freq: Once | CUTANEOUS | Status: AC
  Administered 2019-03-30: 2 via TOPICAL

## 2019-03-30 MED ORDER — BUPIVACAINE HCL (PF) 0.25 % IJ SOLN
INTRAMUSCULAR | Status: DC | PRN
  Administered 2019-03-30: 30 mL

## 2019-03-30 MED ORDER — METOCLOPRAMIDE HCL 5 MG PO TABS: 5.0000 mg | ORAL_TABLET | Freq: Three times a day (TID) | ORAL | Status: DC | PRN

## 2019-03-30 MED ORDER — B COMPLEX PO TABS: 1.0000 | ORAL_TABLET | Freq: Every day | ORAL | Status: DC

## 2019-03-30 MED ORDER — AMLODIPINE BESYLATE 5 MG PO TABS
5.0000 mg | ORAL_TABLET | Freq: Two times a day (BID) | ORAL | Status: DC
  Administered 2019-03-30 – 2019-04-01 (×5): 5 mg via ORAL
  Filled 2019-03-30 (×6): qty 1

## 2019-03-30 MED ORDER — ZOLPIDEM TARTRATE 5 MG PO TABS: 5.0000 mg | ORAL_TABLET | Freq: Every evening | ORAL | Status: DC | PRN

## 2019-03-30 MED ORDER — ONDANSETRON HCL 4 MG PO TABS: 4.0000 mg | ORAL_TABLET | Freq: Four times a day (QID) | ORAL | Status: DC | PRN

## 2019-03-30 MED ORDER — SUGAMMADEX SODIUM 200 MG/2ML IV SOLN
INTRAVENOUS | Status: AC
  Filled 2019-03-30: qty 2

## 2019-03-30 MED ORDER — HYDROMORPHONE HCL 1 MG/ML IJ SOLN
INTRAMUSCULAR | Status: DC | PRN
  Administered 2019-03-30: .2 mg via INTRAVENOUS
  Administered 2019-03-30 (×2): .4 mg via INTRAVENOUS
  Administered 2019-03-30: .2 mg via INTRAVENOUS

## 2019-03-30 MED ORDER — LACTATED RINGERS IV SOLN
INTRAVENOUS | Status: DC
  Administered 2019-03-30 (×2): via INTRAVENOUS

## 2019-03-30 MED ORDER — FENTANYL CITRATE (PF) 250 MCG/5ML IJ SOLN
INTRAMUSCULAR | Status: DC | PRN
  Administered 2019-03-30 (×2): 50 ug via INTRAVENOUS
  Administered 2019-03-30: 100 ug via INTRAVENOUS
  Administered 2019-03-30: 50 ug via INTRAVENOUS

## 2019-03-30 MED ORDER — ACETAMINOPHEN 325 MG PO TABS
325.0000 mg | ORAL_TABLET | Freq: Four times a day (QID) | ORAL | Status: DC | PRN
  Administered 2019-04-01 (×2): 650 mg via ORAL
  Administered 2019-04-02: 325 mg via ORAL
  Administered 2019-04-02: 650 mg via ORAL
  Filled 2019-03-30 (×4): qty 2

## 2019-03-30 MED ORDER — DOCUSATE SODIUM 100 MG PO CAPS
100.0000 mg | ORAL_CAPSULE | Freq: Two times a day (BID) | ORAL | Status: DC
  Administered 2019-03-31 – 2019-04-02 (×6): 100 mg via ORAL
  Filled 2019-03-30 (×6): qty 1

## 2019-03-30 MED ORDER — BACLOFEN 10 MG PO TABS: 10.0000 mg | ORAL_TABLET | Freq: Three times a day (TID) | ORAL | 0 refills | Status: DC

## 2019-03-30 MED ORDER — ONDANSETRON HCL 4 MG PO TABS: 4.0000 mg | ORAL_TABLET | Freq: Three times a day (TID) | ORAL | 0 refills | Status: DC | PRN

## 2019-03-30 MED ORDER — POLYETHYLENE GLYCOL 3350 17 G PO PACK: 17.0000 g | PACK | Freq: Every day | ORAL | Status: DC | PRN

## 2019-03-30 MED ORDER — WARFARIN SODIUM 5 MG PO TABS: 5.0000 mg | ORAL_TABLET | Freq: Every day | ORAL | Status: DC

## 2019-03-30 MED ORDER — SENNA-DOCUSATE SODIUM 8.6-50 MG PO TABS: 2.0000 | ORAL_TABLET | Freq: Every day | ORAL | 1 refills | Status: DC

## 2019-03-30 MED ORDER — CEFAZOLIN SODIUM-DEXTROSE 2-4 GM/100ML-% IV SOLN
2.0000 g | Freq: Four times a day (QID) | INTRAVENOUS | Status: AC
  Administered 2019-03-30 – 2019-03-31 (×2): 2 g via INTRAVENOUS
  Filled 2019-03-30 (×2): qty 100

## 2019-03-30 MED ORDER — HYDROCODONE-ACETAMINOPHEN 7.5-325 MG PO TABS
1.0000 | ORAL_TABLET | ORAL | Status: DC | PRN
  Administered 2019-03-31 – 2019-04-01 (×2): 1 via ORAL
  Filled 2019-03-30: qty 1
  Filled 2019-03-30: qty 2
  Filled 2019-03-30: qty 1

## 2019-03-30 MED ORDER — KETOROLAC TROMETHAMINE 30 MG/ML IJ SOLN
INTRAMUSCULAR | Status: AC
  Filled 2019-03-30: qty 1

## 2019-03-30 MED ORDER — BUPIVACAINE HCL (PF) 0.25 % IJ SOLN
INTRAMUSCULAR | Status: AC
  Filled 2019-03-30: qty 30

## 2019-03-30 MED ORDER — WARFARIN - PHARMACIST DOSING INPATIENT: Freq: Every day | Status: DC

## 2019-03-30 MED ORDER — MENTHOL 3 MG MT LOZG: 1.0000 | LOZENGE | OROMUCOSAL | Status: DC | PRN

## 2019-03-30 MED ORDER — STERILE WATER FOR IRRIGATION IR SOLN
Status: DC | PRN
  Administered 2019-03-30: 2000 mL

## 2019-03-30 MED ORDER — BISACODYL 10 MG RE SUPP: 10.0000 mg | Freq: Every day | RECTAL | Status: DC | PRN

## 2019-03-30 MED ORDER — HYDROCODONE-ACETAMINOPHEN 10-325 MG PO TABS: 1.0000 | ORAL_TABLET | Freq: Four times a day (QID) | ORAL | 0 refills | Status: DC | PRN

## 2019-03-30 MED ORDER — METOCLOPRAMIDE HCL 5 MG/ML IJ SOLN: 5.0000 mg | Freq: Three times a day (TID) | INTRAMUSCULAR | Status: DC | PRN

## 2019-03-30 MED ORDER — LIDOCAINE 2% (20 MG/ML) 5 ML SYRINGE
INTRAMUSCULAR | Status: DC | PRN
  Administered 2019-03-30: 60 mg via INTRAVENOUS

## 2019-03-30 MED ORDER — ROCURONIUM BROMIDE 10 MG/ML (PF) SYRINGE
PREFILLED_SYRINGE | INTRAVENOUS | Status: DC | PRN
  Administered 2019-03-30: 50 mg via INTRAVENOUS

## 2019-03-30 MED ORDER — ACETAMINOPHEN 500 MG PO TABS
500.0000 mg | ORAL_TABLET | Freq: Four times a day (QID) | ORAL | Status: AC
  Administered 2019-03-30 – 2019-03-31 (×4): 500 mg via ORAL
  Filled 2019-03-30 (×4): qty 1

## 2019-03-30 MED ORDER — VITAMIN D 25 MCG (1000 UNIT) PO TABS
1000.0000 [IU] | ORAL_TABLET | Freq: Every day | ORAL | Status: DC
  Administered 2019-03-31 – 2019-04-02 (×3): 1000 [IU] via ORAL
  Filled 2019-03-30 (×3): qty 1

## 2019-03-30 MED ORDER — MAGNESIUM CITRATE PO SOLN: 1.0000 | Freq: Once | ORAL | Status: DC | PRN

## 2019-03-30 MED ORDER — FENTANYL CITRATE (PF) 250 MCG/5ML IJ SOLN
INTRAMUSCULAR | Status: AC
  Filled 2019-03-30: qty 5

## 2019-03-30 MED ORDER — DIPHENHYDRAMINE HCL 12.5 MG/5ML PO ELIX: 12.5000 mg | ORAL_SOLUTION | ORAL | Status: DC | PRN

## 2019-03-30 MED ORDER — TRANEXAMIC ACID-NACL 1000-0.7 MG/100ML-% IV SOLN
1000.0000 mg | INTRAVENOUS | Status: AC
  Administered 2019-03-30: 1000 mg via INTRAVENOUS
  Filled 2019-03-30: qty 100

## 2019-03-30 MED ORDER — HYDROMORPHONE HCL 2 MG/ML IJ SOLN
INTRAMUSCULAR | Status: AC
  Filled 2019-03-30: qty 1

## 2019-03-30 MED ORDER — METHOCARBAMOL 500 MG PO TABS
500.0000 mg | ORAL_TABLET | Freq: Four times a day (QID) | ORAL | Status: DC | PRN
  Administered 2019-03-31 – 2019-04-01 (×4): 500 mg via ORAL
  Filled 2019-03-30 (×4): qty 1

## 2019-03-30 MED ORDER — ONDANSETRON HCL 4 MG/2ML IJ SOLN: 4.0000 mg | Freq: Four times a day (QID) | INTRAMUSCULAR | Status: DC | PRN

## 2019-03-30 MED ORDER — SIMVASTATIN 20 MG PO TABS
20.0000 mg | ORAL_TABLET | Freq: Every day | ORAL | Status: DC
  Administered 2019-03-31 – 2019-04-01 (×3): 20 mg via ORAL
  Filled 2019-03-30 (×3): qty 1

## 2019-03-30 MED ORDER — ONDANSETRON HCL 4 MG/2ML IJ SOLN
INTRAMUSCULAR | Status: AC
  Filled 2019-03-30: qty 2

## 2019-03-30 MED ORDER — PHENOL 1.4 % MT LIQD: 1.0000 | OROMUCOSAL | Status: DC | PRN

## 2019-03-30 MED ORDER — FLUTICASONE PROPIONATE 50 MCG/ACT NA SUSP: 2.0000 | Freq: Every day | NASAL | Status: DC | PRN

## 2019-03-30 MED ORDER — FENTANYL CITRATE (PF) 100 MCG/2ML IJ SOLN: 25.0000 ug | INTRAMUSCULAR | Status: DC | PRN

## 2019-03-30 MED ORDER — KETOROLAC TROMETHAMINE 15 MG/ML IJ SOLN
7.5000 mg | Freq: Four times a day (QID) | INTRAMUSCULAR | Status: AC
  Administered 2019-03-31 (×4): 7.5 mg via INTRAVENOUS
  Filled 2019-03-30 (×4): qty 1

## 2019-03-30 MED ORDER — POTASSIUM CHLORIDE IN NACL 20-0.45 MEQ/L-% IV SOLN
INTRAVENOUS | Status: DC
  Administered 2019-03-30: 18:00:00 via INTRAVENOUS
  Filled 2019-03-30 (×2): qty 1000

## 2019-03-30 MED ORDER — B COMPLEX-C PO TABS
1.0000 | ORAL_TABLET | Freq: Every day | ORAL | Status: DC
  Administered 2019-03-31 – 2019-04-02 (×3): 1 via ORAL
  Filled 2019-03-30 (×3): qty 1

## 2019-03-30 SURGICAL SUPPLY — 53 items
BIT DRILL 2.0X128 (BIT) ×2 IMPLANT
BLADE SAW SGTL 73X25 THK (BLADE) ×2 IMPLANT
BLADE SURG SZ10 CARB STEEL (BLADE) ×4 IMPLANT
COVER SURGICAL LIGHT HANDLE (MISCELLANEOUS) ×2 IMPLANT
COVER WAND RF STERILE (DRAPES) IMPLANT
DRAPE INCISE IOBAN 66X45 STRL (DRAPES) ×3 IMPLANT
DRAPE ORTHO SPLIT 77X108 STRL (DRAPES) ×4
DRAPE POUCH INSTRU U-SHP 10X18 (DRAPES) ×2 IMPLANT
DRAPE SHEET LG 3/4 BI-LAMINATE (DRAPES) ×2 IMPLANT
DRAPE SURG 17X11 SM STRL (DRAPES) ×2 IMPLANT
DRAPE SURG ORHT 6 SPLT 77X108 (DRAPES) ×2 IMPLANT
DRAPE U-SHAPE 47X51 STRL (DRAPES) ×2 IMPLANT
DRSG MEPILEX BORDER 4X8 (GAUZE/BANDAGES/DRESSINGS) ×2 IMPLANT
DURAPREP 26ML APPLICATOR (WOUND CARE) ×4 IMPLANT
ELECT BLADE TIP CTD 4 INCH (ELECTRODE) ×2 IMPLANT
ELECT REM PT RETURN 15FT ADLT (MISCELLANEOUS) ×2 IMPLANT
ELIMINATOR HOLE APEX DEPUY (Hips) ×1 IMPLANT
GLOVE BIO SURGEON STRL SZ7.5 (GLOVE) ×2 IMPLANT
GLOVE BIO SURGEON STRL SZ8 (GLOVE) ×2 IMPLANT
GLOVE BIOGEL PI IND STRL 8 (GLOVE) ×2 IMPLANT
GLOVE BIOGEL PI INDICATOR 8 (GLOVE) ×2
GOWN STRL REUS W/TWL 2XL LVL3 (GOWN DISPOSABLE) ×2 IMPLANT
GOWN STRL REUS W/TWL LRG LVL3 (GOWN DISPOSABLE) ×2 IMPLANT
HEAD M SROM 36MM PLUS 1.5 (Hips) IMPLANT
HOOD PEEL AWAY FLYTE STAYCOOL (MISCELLANEOUS) ×6 IMPLANT
KIT BASIN OR (CUSTOM PROCEDURE TRAY) ×2 IMPLANT
KIT TURNOVER KIT A (KITS) IMPLANT
LINER NEUTRAL 52X36MM PLUS 4 (Liner) ×1 IMPLANT
MANIFOLD NEPTUNE II (INSTRUMENTS) ×2 IMPLANT
NS IRRIG 1000ML POUR BTL (IV SOLUTION) ×2 IMPLANT
PACK TOTAL JOINT (CUSTOM PROCEDURE TRAY) ×2 IMPLANT
PIN SECTOR W/GRIP ACE CUP 52MM (Hips) ×1 IMPLANT
PROTECTOR NERVE ULNAR (MISCELLANEOUS) ×2 IMPLANT
RETRIEVER SUT HEWSON (MISCELLANEOUS) ×2 IMPLANT
SCREW 6.5MMX25MM (Screw) ×1 IMPLANT
SROM M HEAD 36MM PLUS 1.5 (Hips) ×2 IMPLANT
STEM SUMMIT STD 4 (Stem) ×1 IMPLANT
STRIP CLOSURE SKIN 1/2X4 (GAUZE/BANDAGES/DRESSINGS) ×1 IMPLANT
SUCTION FRAZIER HANDLE 12FR (TUBING) ×1
SUCTION TUBE FRAZIER 12FR DISP (TUBING) ×1 IMPLANT
SUT FIBERWIRE #2 38 REV NDL BL (SUTURE) ×6
SUT MNCRL AB 4-0 PS2 18 (SUTURE) ×1 IMPLANT
SUT VIC AB 0 CT1 36 (SUTURE) ×2 IMPLANT
SUT VIC AB 2-0 CT1 27 (SUTURE) ×4
SUT VIC AB 2-0 CT1 TAPERPNT 27 (SUTURE) ×2 IMPLANT
SUT VIC AB 3-0 SH 8-18 (SUTURE) ×2 IMPLANT
SUTURE FIBERWR#2 38 REV NDL BL (SUTURE) ×3 IMPLANT
SYR CONTROL 10ML LL (SYRINGE) ×2 IMPLANT
TOWEL OR 17X26 10 PK STRL BLUE (TOWEL DISPOSABLE) ×4 IMPLANT
TOWEL OR NON WOVEN STRL DISP B (DISPOSABLE) ×2 IMPLANT
TRAY FOLEY CATH 14FR (SET/KITS/TRAYS/PACK) ×1 IMPLANT
WATER STERILE IRR 1000ML POUR (IV SOLUTION) ×4 IMPLANT
YANKAUER SUCT BULB TIP 10FT TU (MISCELLANEOUS) ×2 IMPLANT

## 2019-03-30 NOTE — Anesthesia Postprocedure Evaluation (Signed)
Anesthesia Post Note  Patient: [name]  Procedure(s) Performed: TOTAL HIP ARTHROPLASTY (Left Hip)     Patient location during evaluation: PACU Anesthesia Type: General Level of consciousness: awake and alert Pain management: pain level controlled Vital Signs Assessment: post-procedure vital signs reviewed and stable Respiratory status: spontaneous breathing, nonlabored ventilation, respiratory function stable and patient connected to nasal cannula oxygen Cardiovascular status: blood pressure returned to baseline and stable Postop Assessment: no apparent nausea or vomiting Anesthetic complications: no    Last Vitals:  Vitals:   03/30/19 1630 03/30/19 1700  BP: 127/72 140/69  Pulse: 93 89  Resp: 15 15  Temp:  36.6 C  SpO2: 98% 97%    Last Pain:  Vitals:   03/30/19 1700  TempSrc:   PainSc: 3     LLE Motor Response: Purposeful movement (03/30/19 1700) LLE Sensation: Full sensation (03/30/19 1700)          Montez Hageman

## 2019-03-30 NOTE — Op Note (Addendum)
03/30/2019  4:06 PM  PATIENT:  [name]   MRN: [id number]  PRE-OPERATIVE DIAGNOSIS: Left hip primary localized osteoarthritis  POST-OPERATIVE DIAGNOSIS:  same  PROCEDURE:  Procedure(s): LEFT TOTAL HIP ARTHROPLASTY  PREOPERATIVE INDICATIONS:    [name] is an 73 y.o. female who has a diagnosis of left hip osteoarthritis and elected for surgical management after failing conservative treatment.  The risks benefits and alternatives were discussed with the patient including but not limited to the risks of nonoperative treatment, versus surgical intervention including infection, bleeding, nerve injury, periprosthetic fracture, the need for revision surgery, dislocation, leg length discrepancy, blood clots, cardiopulmonary complications, morbidity, mortality, among others, and they were willing to proceed.     OPERATIVE REPORT     SURGEON:  [name], MD    ASSISTANT:  Joya Gaskins, OPA-C  (Present throughout the entire procedure,  necessary for completion of procedure in a timely manner, assisting with retraction, instrumentation, and closure)     ANESTHESIA: General  ESTIMATED BLOOD LOSS: 734 mL    COMPLICATIONS:  None.     UNIQUE ASPECTS OF THE CASE: She had circumferential rimming osteophyte particularly posteriorly and inferiorly.  I had to remove a lot of this osteophyte off of the back and then even down below the inferior portion of the cup.  Bone quality was mediocre.  The femur was between a size 4 and a 5, but the size 5 was much too proud, so I ended up using 4, and had a couple of these showing proximally.  It had a stout medial lateral wedge.  COMPONENTS:  [employer] fit femur size 4 with a 36 mm + 1.5 metallic head ball and a Gription Acetabular shell size 52, with a single cancellous screw for backup fixation, with an apex hole eliminator and a +4 neutral polyethylene liner.    PROCEDURE IN DETAIL:   The patient was met in the holding area  and  identified.  The appropriate hip was identified and marked at the operative site.  The patient was then transported to the OR  and  placed under anesthesia.  At that point, the patient was  placed in the lateral decubitus position with the operative side up and  secured to the operating room table and all bony prominences padded.     The operative lower extremity was prepped from the iliac crest to the distal leg.  Sterile draping was performed.  Time out was performed prior to incision.      A routine posterolateral approach was utilized via sharp dissection  carried down to the subcutaneous tissue.  Gross bleeders were Bovie coagulated.  The iliotibial band was identified and incised along the length of the skin incision.  Self-retaining retractors were  inserted.  With the hip internally rotated, the short external rotators  were identified. The piriformis and capsule was tagged with FiberWire, and the hip capsule released in a T-type fashion.  The femoral neck was exposed, and I resected the femoral neck using the appropriate jig. This was performed at approximately a thumb's breadth above the lesser trochanter.    I then exposed the deep acetabulum, cleared out any tissue including the ligamentum teres.  A wing retractor was placed.  After adequate visualization, I excised the labrum, and then sequentially reamed.  I placed the trial acetabulum, which seated nicely, and then impacted the real cup into place.  Appropriate version and inclination was confirmed clinically matching their bony anatomy, and also  with the use of the jig.  I placed a cancellous screw to augment fixation.  A trial polyethylene liner was placed and the wing retractor removed.    I then prepared the proximal femur using the cookie-cutter, the lateralizing reamer, and then sequentially reamed and broached.  A trial broach, neck, and head was utilized, and I reduced the hip and it was found to have excellent stability  with functional range of motion. The trial components were then removed, and the real polyethylene liner was placed.  I removed some of the rimming osteophytes from inferiorly and posteriorly.  I then impacted the real femoral prosthesis into place into the appropriate version, and I impacted the real head ball into place. The hip was then reduced and taken through functional range of motion and found to have excellent stability. Leg lengths were restored.  I then used a 2 mm drill bits to pass the FiberWire suture from the capsule and piriformis through the greater trochanter, and secured this. Excellent posterior capsular repair was achieved. I also closed the T in the capsule.  I then irrigated the hip copiously again with pulse lavage, and repaired the fascia with Vicryl, followed by Vicryl for the subcutaneous tissue, Monocryl for the skin, Steri-Strips and sterile gauze. The wounds were injected. The patient was then awakened and returned to PACU in stable and satisfactory condition. There were no complications.  [name], MD Orthopedic Surgeon [phone]   03/30/2019 4:06 PM

## 2019-03-30 NOTE — H&P (Signed)
PREOPERATIVE H&P  Chief Complaint: Left hip pain  HPI: [name] is a 73 y.o. female who presents for preoperative history and physical with a diagnosis of left hip osteoarthritis. Symptoms are rated as moderate to severe, and have been worsening.  This is significantly impairing activities of daily living.  She has elected for surgical management.   She has failed injections, activity modification, anti-inflammatories, and assistive devices.  Preoperative X-rays demonstrate end stage degenerative changes with osteophyte formation, loss of joint space, subchondral sclerosis.   Past Medical History:  Diagnosis Date  . Allergic rhinitis   . Clotting disorder ([place])    Factor 5  . Diabetes mellitus without complication ([place])    no meds-diet controlled  . Factor 5 Leiden mutation, heterozygous ([place])   . Heart murmur    mild mitral regurg-echo 2010  . Hyperlipidemia   . Hypertension   . Lupus anticoagulant positive   . Obesity   . Primary localized osteoarthritis of left hip 03/30/2019  . Stroke [facility]) 2010   Past Surgical History:  Procedure Laterality Date  . ABDOMINAL HYSTERECTOMY  1981  . APPENDECTOMY    . AXILLARY LYMPH NODE BIOPSY Left 05/16/2014   Procedure: EXCISION 2CM LEFT AXILLARY MASS;  Surgeon: [name], MD;  Location: [facility];  Service: General;  Laterality: Left;  Left axillary sebaceous cyst excision  . DILATION AND CURETTAGE OF UTERUS    . SKIN CANCER EXCISION  2016   [facility]   Social History   Socioeconomic History  . Marital status: Divorced    Spouse name: Not on file  . Number of children: 2  . Years of education: Not on file  . Highest education level: Not on file  Occupational History  . Occupation: Retired  Scientific laboratory technician  . Financial resource strain: Not on file  . Food insecurity    Worry: Not on file    Inability: Not on file  . Transportation needs    Medical: Not on file    Non-medical: Not on file  Tobacco Use   . Smoking status: Former Smoker    Types: Cigarettes    Quit date: 03/14/2009    Years since quitting: 10.0  . Smokeless tobacco: Never Used  Substance and Sexual Activity  . Alcohol use: No    Alcohol/week: 0.0 standard drinks  . Drug use: No  . Sexual activity: Not on file  Lifestyle  . Physical activity    Days per week: Not on file    Minutes per session: Not on file  . Stress: Not on file  Relationships  . Social Herbalist on phone: Not on file    Gets together: Not on file    Attends religious service: Not on file    Active member of club or organization: Not on file    Attends meetings of clubs or organizations: Not on file    Relationship status: Not on file  Other Topics Concern  . Not on file  Social History Narrative  . Not on file   Family History  Problem Relation Age of Onset  . Heart disease Mother   . COPD Father   . Colon cancer Sister   . Clotting disorder Sister   . Diabetes Sister   . Kidney disease Sister   . Heart disease Brother    Allergies  Allergen Reactions  . Codeine     hallucination   Prior to Admission medications   Medication Sig  Start Date End Date Taking? Authorizing Provider  acetaminophen (TYLENOL) 650 MG CR tablet Take 1,300 mg by mouth every 8 (eight) hours.   Yes [provider]  amLODipine (NORVASC) 5 MG tablet Take 5 mg by mouth 2 (two) times daily.    Yes [provider]  aspirin 81 MG tablet Take 81 mg by mouth daily.   Yes [provider]  B Complex Vitamins (B COMPLEX PO) Take 1 tablet by mouth daily.    Yes [provider]  BIOTIN PO Take 1 tablet by mouth daily.    Yes [provider]  CALCIUM PO Take 2 tablets by mouth daily.    Yes [provider]  cholecalciferol (VITAMIN D) 1000 UNITS tablet Take 1,000 Units by mouth daily.    Yes [provider]  diclofenac sodium (VOLTAREN) 1 % GEL Apply 1 application topically 3 (three) times daily as  needed (pain).   Yes [provider]  enoxaparin (LOVENOX) 80 MG/0.8ML injection Inject 80 mg into the skin.   Yes [provider]  escitalopram (LEXAPRO) 10 MG tablet Take 10 mg by mouth daily. 11/25/14  Yes [provider]  ezetimibe (ZETIA) 10 MG tablet Take 5 mg by mouth 3 (three) times a week.    Yes [provider]  fluticasone (FLONASE) 50 MCG/ACT nasal spray Place 2 sprays into both nostrils daily as needed for allergies.  07/24/17  Yes [provider]  lisinopril (PRINIVIL,ZESTRIL) 10 MG tablet Take 10 mg by mouth 2 (two) times daily.  08/22/17  Yes [provider]  Magnesium 250 MG TABS Take 250 mg by mouth daily.    Yes [provider]  simvastatin (ZOCOR) 20 MG tablet Take 20 mg by mouth at bedtime.    Yes [provider]  vitamin B-12 (CYANOCOBALAMIN) 1000 MCG tablet Take 1,000 mcg by mouth daily.   Yes [provider]  warfarin (COUMADIN) 5 MG tablet Take 5 mg by mouth daily. Take 5 mg by mouth daily   Yes [provider]  baclofen (LIORESAL) 10 MG tablet Take 1 tablet (10 mg total) by mouth 3 (three) times daily. As needed for muscle spasm 03/30/19   [name], MD  HYDROcodone-acetaminophen [facility]) 10-325 MG tablet Take 1 tablet by mouth every 6 (six) hours as needed. 03/30/19   [name], MD  ondansetron (ZOFRAN) 4 MG tablet Take 1 tablet (4 mg total) by mouth every 8 (eight) hours as needed for nausea or vomiting. 03/30/19   [name], MD  sennosides-docusate sodium (SENOKOT-S) 8.6-50 MG tablet Take 2 tablets by mouth daily. 03/30/19   [name], MD     Positive ROS: All other systems have been reviewed and were otherwise negative with the exception of those mentioned in the HPI and as above.  Physical Exam: General: Alert, no acute distress Cardiovascular: No pedal edema Respiratory: No cyanosis, no use of accessory musculature GI: No organomegaly, abdomen is soft and  non-tender Skin: No lesions in the area of chief complaint Neurologic: Sensation intact distally Psychiatric: Patient is competent for consent with normal mood and affect Lymphatic: No axillary or cervical lymphadenopathy  MUSCULOSKELETAL: Left leg feels somewhat shorter than the other side, EHL is intact, hip motion 0 to 80 degrees with no internal rotation.  Assessment: Left hip primary localized osteoarthritis coexisting risk factors as indicated above including factor V Leiden and history of stroke   Plan: Plan for Procedure(s): TOTAL HIP ARTHROPLASTY  The risks benefits and alternatives  were discussed with the patient including but not limited to the risks of nonoperative treatment, versus surgical intervention including infection, bleeding, nerve injury,  blood clots, cardiopulmonary complications, morbidity, mortality, among others, and they were willing to proceed.     Patient's anticipated LOS is less than 2 midnights, she has been prior authorized for observation, despite not meeting these requirements: - Younger than 8 - Lives within 1 hour of care - Has a competent adult at home to recover with post-op recover - NO history of  - Chronic pain requiring opiods  - Diabetes  - Coronary Artery Disease  - Heart failure  - Heart attack  - Stroke  - DVT/VTE  - Cardiac arrhythmia  - Respiratory Failure/COPD  - Renal failure  - Anemia  - Advanced Liver disease        Johnny Bridge, MD Cell [phone]   03/30/2019 12:47 PM

## 2019-03-30 NOTE — Transfer of Care (Signed)
Immediate Anesthesia Transfer of Care Note  Patient: [name]  Procedure(s) Performed: TOTAL HIP ARTHROPLASTY (Left Hip)  Patient Location: PACU  Anesthesia Type:General  Level of Consciousness: awake, drowsy and patient cooperative  Airway & Oxygen Therapy: Patient Spontanous Breathing and Patient connected to face mask oxygen  Post-op Assessment: Report given to RN and Post -op Vital signs reviewed and stable  Post vital signs: Reviewed and stable  Last Vitals:  Vitals Value Taken Time  BP 139/74 03/30/19 1622  Temp    Pulse 98 03/30/19 1624  Resp 20 03/30/19 1622  SpO2 100 % 03/30/19 1624  Vitals shown include unvalidated device data.  Last Pain:  Vitals:   03/30/19 1135  TempSrc: Oral  PainSc:       Patients Stated Pain Goal: 4 (98/11/91 4782)  Complications: No apparent anesthesia complications

## 2019-03-30 NOTE — Care Plan (Signed)
Ortho Bundle Case Management Note  Patient Details  Name: [name] MRN: [id number] Date of Birth: 1946-05-07  Lengthy conversation with patient and her son prior to surgery. She lives alone and independent currently. They plan to hire caregivers for her post op. 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 She has foot drop on her right. She has a balance disorder that is being followed by neuro. She uses a cane and a walker at present but may need something different. Will assess further needs with PT after surgery.  She has [facility] in now and would like to continue with them post op. She will transition to [facility] in [place] as appropriate.   Patient, family and MD aware of plan and agreeable. Choice was offered.                    DME Arranged:    DME Agency:     HH Arranged:  PT, RN [place] Agency:  [facility] (formerly [facility])  Additional Comments: Please contact me with any questions of if this plan should need to change.  Ladell Heads,  [place] Orthopaedic Specialist  [phone] 03/30/2019, 12:42 PM

## 2019-03-30 NOTE — Anesthesia Procedure Notes (Signed)
Procedure Name: Intubation Date/Time: 03/30/2019 1:58 PM Performed by: Sharlette Dense, CRNA Pre-anesthesia Checklist: Patient identified Patient Re-evaluated:Patient Re-evaluated prior to induction Oxygen Delivery Method: Circle system utilized Preoxygenation: Pre-oxygenation with 100% oxygen Induction Type: IV induction Ventilation: Mask ventilation without difficulty and Oral airway inserted - appropriate to patient size Laryngoscope Size: Sabra Heck and 2 Grade View: Grade I Tube type: Oral Tube size: 7.5 mm Number of attempts: 1 Airway Equipment and Method: Stylet Placement Confirmation: ETT inserted through vocal cords under direct vision,  positive ETCO2 and breath sounds checked- equal and bilateral Secured at: 21 cm Tube secured with: Tape Dental Injury: Teeth and Oropharynx as per pre-operative assessment

## 2019-03-30 NOTE — Discharge Instructions (Signed)

## 2019-03-30 NOTE — Progress Notes (Signed)
ANTICOAGULATION CONSULT NOTE - Initial Consult  Pharmacy Consult for Coumadin Indication: VTE prophylaxis, hx Factor V Leiden/LA  Allergies  Allergen Reactions  . Codeine     hallucination    Patient Measurements: Height: 5\' 4"  (162.6 cm) Weight: 166 lb 4.8 oz (75.4 kg) IBW/kg (Calculated) : 54.7  Vital Signs: Temp: 98.2 F (36.8 C) (06/16 1135) Temp Source: Oral (06/16 1135) BP: 132/65 (06/16 1135) Pulse Rate: 76 (06/16 1135)  Labs: Recent Labs    03/30/19 1141  LABPROT 12.4  INR 0.9    Estimated Creatinine Clearance: 62.3 mL/min (by C-G formula based on SCr of 0.79 mg/dL).   Medical History: Past Medical History:  Diagnosis Date  . Allergic rhinitis   . 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 Diabetes mellitus without complication ([place])    no meds-diet controlled  . Factor 5 Leiden mutation, heterozygous ([place])   . Heart murmur    mild mitral regurg-echo 2010  . Hyperlipidemia   . Hypertension   . Lupus anticoagulant positive   . Obesity   . Primary localized osteoarthritis of left hip 03/30/2019  . Stroke [facility]) 2010    Medications:  Coumadin PTA 5mg  daily- last dose 6/11.  Bridged with Lovenox 80mg  sq BID- last dose 6/15 @ 8pm  Assessment: 67 yoF admitted for elective THA.  She is on chronic Coumadin for clotting disorder.  INR at baseline since Coumadin held x5 days pre-op.   CBC WNL at baseline. No bleeding noted.  Anticipate post-op blood loss.    Goal of Therapy:  INR 2-3 Monitor platelets by anticoagulation protocol: Yes   Plan:  Coumadin 7.5mg  po x1 today Bridge with Lovenox x2 days per MD Daily INR  Veyda Kaufman, [place] Drafts 03/30/2019,12:54 PM

## 2019-03-30 NOTE — Plan of Care (Signed)
  Problem: Education: Goal: Knowledge of the prescribed therapeutic regimen will improve Outcome: Progressing Goal: Understanding of discharge needs will improve Outcome: Progressing Goal: Individualized Educational Video(s) Outcome: Progressing   Problem: Activity: Goal: Ability to avoid complications of mobility impairment will improve Outcome: Progressing Goal: Ability to tolerate increased activity will improve Outcome: Progressing   Problem: Clinical Measurements: Goal: Postoperative complications will be avoided or minimized Outcome: Progressing   Problem: Pain Management: Goal: Pain level will decrease with appropriate interventions Outcome: Progressing   Problem: Skin Integrity: Goal: Will show signs of wound healing Outcome: Progressing   Problem: Education: Goal: Knowledge of General Education information will improve Description: Including pain rating scale, medication(s)/side effects and non-pharmacologic comfort measures Outcome: Progressing   Problem: Health Behavior/Discharge Planning: Goal: Ability to manage health-related needs will improve Outcome: Progressing   Problem: Clinical Measurements: Goal: Ability to maintain clinical measurements within normal limits will improve Outcome: Progressing Goal: Will remain free from infection Outcome: Progressing Goal: Diagnostic test results will improve Outcome: Progressing Goal: Respiratory complications will improve Outcome: Progressing Goal: Cardiovascular complication will be avoided Outcome: Progressing   Problem: Activity: Goal: Risk for activity intolerance will decrease Outcome: Progressing   Problem: Nutrition: Goal: Adequate nutrition will be maintained Outcome: Progressing   Problem: Coping: Goal: Level of anxiety will decrease Outcome: Progressing   Problem: Elimination: Goal: Will not experience complications related to bowel motility Outcome: Progressing Goal: Will not experience  complications related to urinary retention Outcome: Progressing   Problem: Pain Managment: Goal: General experience of comfort will improve Outcome: Progressing   Problem: Safety: Goal: Ability to remain free from injury will improve Outcome: Progressing   Problem: Skin Integrity: Goal: Risk for impaired skin integrity will decrease Outcome: Progressing  Plan of care discussed with patient

## 2019-03-31 ENCOUNTER — Encounter (HOSPITAL_COMMUNITY): Payer: Self-pay | Admitting: Orthopedic Surgery

## 2019-03-31 DIAGNOSIS — Z833 Family history of diabetes mellitus: Secondary | ICD-10-CM | POA: Diagnosis not present

## 2019-03-31 DIAGNOSIS — Z7901 Long term (current) use of anticoagulants: Secondary | ICD-10-CM | POA: Diagnosis not present

## 2019-03-31 DIAGNOSIS — Z96642 Presence of left artificial hip joint: Secondary | ICD-10-CM | POA: Diagnosis not present

## 2019-03-31 DIAGNOSIS — Z7902 Long term (current) use of antithrombotics/antiplatelets: Secondary | ICD-10-CM | POA: Diagnosis not present

## 2019-03-31 DIAGNOSIS — E669 Obesity, unspecified: Secondary | ICD-10-CM | POA: Diagnosis present

## 2019-03-31 DIAGNOSIS — Z885 Allergy status to narcotic agent status: Secondary | ICD-10-CM | POA: Diagnosis not present

## 2019-03-31 DIAGNOSIS — E785 Hyperlipidemia, unspecified: Secondary | ICD-10-CM | POA: Diagnosis present

## 2019-03-31 DIAGNOSIS — Z8249 Family history of ischemic heart disease and other diseases of the circulatory system: Secondary | ICD-10-CM | POA: Diagnosis not present

## 2019-03-31 DIAGNOSIS — M25552 Pain in left hip: Secondary | ICD-10-CM | POA: Diagnosis not present

## 2019-03-31 DIAGNOSIS — I10 Essential (primary) hypertension: Secondary | ICD-10-CM | POA: Diagnosis present

## 2019-03-31 DIAGNOSIS — Z79899 Other long term (current) drug therapy: Secondary | ICD-10-CM | POA: Diagnosis not present

## 2019-03-31 DIAGNOSIS — D62 Acute posthemorrhagic anemia: Secondary | ICD-10-CM | POA: Diagnosis not present

## 2019-03-31 DIAGNOSIS — Z87891 Personal history of nicotine dependence: Secondary | ICD-10-CM | POA: Diagnosis not present

## 2019-03-31 DIAGNOSIS — Z825 Family history of asthma and other chronic lower respiratory diseases: Secondary | ICD-10-CM | POA: Diagnosis not present

## 2019-03-31 DIAGNOSIS — Z8673 Personal history of transient ischemic attack (TIA), and cerebral infarction without residual deficits: Secondary | ICD-10-CM | POA: Diagnosis not present

## 2019-03-31 DIAGNOSIS — Z841 Family history of disorders of kidney and ureter: Secondary | ICD-10-CM | POA: Diagnosis not present

## 2019-03-31 DIAGNOSIS — D6851 Activated protein C resistance: Secondary | ICD-10-CM | POA: Diagnosis present

## 2019-03-31 DIAGNOSIS — E119 Type 2 diabetes mellitus without complications: Secondary | ICD-10-CM | POA: Diagnosis present

## 2019-03-31 DIAGNOSIS — R55 Syncope and collapse: Secondary | ICD-10-CM | POA: Diagnosis not present

## 2019-03-31 DIAGNOSIS — M25752 Osteophyte, left hip: Secondary | ICD-10-CM | POA: Diagnosis present

## 2019-03-31 DIAGNOSIS — Z7982 Long term (current) use of aspirin: Secondary | ICD-10-CM | POA: Diagnosis not present

## 2019-03-31 DIAGNOSIS — M1612 Unilateral primary osteoarthritis, left hip: Secondary | ICD-10-CM | POA: Diagnosis present

## 2019-03-31 DIAGNOSIS — Z832 Family history of diseases of the blood and blood-forming organs and certain disorders involving the immune mechanism: Secondary | ICD-10-CM | POA: Diagnosis not present

## 2019-03-31 DIAGNOSIS — D6862 Lupus anticoagulant syndrome: Secondary | ICD-10-CM | POA: Diagnosis present

## 2019-03-31 LAB — BASIC METABOLIC PANEL
Anion gap: 9 (ref 5–15)
BUN: 11 mg/dL (ref 8–23)
CO2: 24 mmol/L (ref 22–32)
Calcium: 8.7 mg/dL — ABNORMAL LOW (ref 8.9–10.3)
Chloride: 108 mmol/L (ref 98–111)
Creatinine, Ser: 0.62 mg/dL (ref 0.44–1.00)
GFR calc Af Amer: >60 mL/min (ref >60)
GFR calc non Af Amer: >60 mL/min (ref >60)
Glucose, Bld: 146 mg/dL — ABNORMAL HIGH (ref 70–99)
Potassium: 5.1 mmol/L (ref 3.5–5.1)
Sodium: 141 mmol/L (ref 135–145)

## 2019-03-31 LAB — CBC
HCT: 35 % — ABNORMAL LOW (ref 36.0–46.0)
Hemoglobin: 10.7 g/dL — ABNORMAL LOW (ref 12.0–15.0)
MCH: 31.3 pg (ref 26.0–34.0)
MCHC: 30.6 g/dL (ref 30.0–36.0)
MCV: 102.3 fL — ABNORMAL HIGH (ref 80.0–100.0)
Platelets: 204 10*3/uL (ref 150–400)
RBC: 3.42 MIL/uL — ABNORMAL LOW (ref 3.87–5.11)
RDW: 12.8 % (ref 11.5–15.5)
WBC: 8.8 10*3/uL (ref 4.0–10.5)
nRBC: 0 % (ref 0.0–0.2)

## 2019-03-31 LAB — PROTIME-INR
INR: 1 (ref 0.8–1.2)
Prothrombin Time: 13.5 seconds (ref 11.4–15.2)

## 2019-03-31 MED ORDER — WARFARIN SODIUM 5 MG PO TABS
7.5000 mg | ORAL_TABLET | Freq: Once | ORAL | Status: AC
  Administered 2019-03-31: 7.5 mg via ORAL
  Filled 2019-03-31: qty 1

## 2019-03-31 NOTE — Plan of Care (Signed)
Plan of care reviewed and discussed with the patient. 

## 2019-03-31 NOTE — Evaluation (Signed)
Physical Therapy Evaluation Patient Details Name: [name] MRN: [id number] DOB: 1946-06-04 Today's Date: 03/31/2019   History of Present Illness  Pt is a 73 year old female s/p L THA posterior approach with hx of R residual hemiparesis from CVA  Clinical Impression  Pt is s/p THA resulting in the deficits listed below (see PT Problem List).  Pt will benefit from skilled PT to increase their independence and safety with mobility to allow discharge to the venue listed below.  Pt assisted OOB however unable to take steps this morning so assisted to recliner.  Pt may need platform(s) RW for d/c so will trial equipment to improve mobility next session.     Follow Up Recommendations Home health PT;Supervision/Assistance - 24 hour;Follow surgeon's recommendation for DC plan and follow-up therapies(plan is for home with HHPT)    Equipment Recommendations  Rolling walker with 5" wheels;Other (comment)(may need platforms, will trial next session)    Recommendations for Other Services       Precautions / Restrictions Precautions Precautions: Fall;Posterior Hip Precaution Comments: educated on posterior hip precautions Restrictions Other Position/Activity Restrictions: WBAT      Mobility  Bed Mobility Overal bed mobility: Needs Assistance Bed Mobility: Supine to Sit     Supine to sit: Mod assist     General bed mobility comments: assist for L LE and scooting to EOB, cues for maintaining precautions  Transfers Overall transfer level: Needs assistance Equipment used: Rolling walker (2 wheeled) Transfers: Sit to/from [facility] Sit to Stand: Mod assist;From elevated surface;+2 safety/equipment Stand pivot transfers: Min assist;+2 safety/equipment       General transfer comment: verbal cues for UE and LE positioning, assist to rise and steady, pt unable to take steps despite cues for technique so shuffling steps for pivoting and recliner brought behind pt, cues  for maintaining prcautions; pt with significant weight shift to left side; will trial platform (s) next session  Ambulation/Gait                Stairs            Wheelchair Mobility    Modified Rankin (Stroke Patients Only)       Balance                                             Pertinent Vitals/Pain Pain Assessment: 0-10 Pain Score: 6  Pain Location: left hip Pain Descriptors / Indicators: Aching;Sore Pain Intervention(s): Limited activity within patient's tolerance;Monitored during session;Repositioned;Ice applied    Home Living Family/patient expects to be discharged to:: Private residence Living Arrangements: Alone Available Help at Discharge: Available 24 hours/day;Personal care attendant(plans to have 24/7 caregivers upon d/c) Type of Home: House Home Access: Level entry     Home Layout: One level Home Equipment: Cane - single point;Other (comment)(3 wheeled walker)      Prior Function Level of Independence: Independent with assistive device(s)         Comments: using 3 wheeled walker prior to admission     Hand Dominance        Extremity/Trunk Assessment        Lower Extremity Assessment Lower Extremity Assessment: RLE deficits/detail;LLE deficits/detail RLE Deficits / Details: residual deficits from previous stroke, 2+/5 DF LLE Deficits / Details: anticipated post op hip weakness, able to perform ankle pumps, good quad contraction, decreased hip abduction AAROM  Communication   Communication: No difficulties  Cognition Arousal/Alertness: Awake/alert Behavior During Therapy: WFL for tasks assessed/performed Overall Cognitive Status: Within Functional Limits for tasks assessed                                        General Comments      Exercises Total Joint Exercises Ankle Circles/Pumps: AROM;10 reps;Both Quad Sets: AROM;10 reps;Both Short Arc Quad: AROM;Left;10 reps Heel Slides:  AAROM;10 reps;Left(maintained precautions) Hip ABduction/ADduction: AAROM;10 reps;Left Long Arc Quad: AROM;10 reps;Left;Seated   Assessment/Plan    PT Assessment Patient needs continued PT services  PT Problem List Decreased strength;Decreased mobility;Decreased activity tolerance;Decreased knowledge of use of DME;Decreased range of motion;Decreased safety awareness;Decreased knowledge of precautions;Pain;Decreased balance;Decreased coordination       PT Treatment Interventions Functional mobility training;Stair training;Balance training;Gait training;Therapeutic exercise;Patient/family education;DME instruction;Therapeutic activities    PT Goals (Current goals can be found in the Care Plan section)  Acute Rehab PT Goals PT Goal Formulation: With patient Time For Goal Achievement: 04/06/19 Potential to Achieve Goals: Good    Frequency 7X/week   Barriers to discharge        Co-evaluation               AM-PAC PT "6 Clicks" Mobility  Outcome Measure Help needed turning from your back to your side while in a flat bed without using bedrails?: A Little Help needed moving from lying on your back to sitting on the side of a flat bed without using bedrails?: A Lot Help needed moving to and from a bed to a chair (including a wheelchair)?: A Lot Help needed standing up from a chair using your arms (e.g., wheelchair or bedside chair)?: A Lot Help needed to walk in hospital room?: A Lot Help needed climbing 3-5 steps with a railing? : Total 6 Click Score: 12    End of Session Equipment Utilized During Treatment: Gait belt Activity Tolerance: Patient tolerated treatment well Patient left: with call bell/phone within reach;in chair;with chair alarm set Nurse Communication: Mobility status PT Visit Diagnosis: Other abnormalities of gait and mobility (R26.89)    Time: 8182-9937 PT Time Calculation (min) (ACUTE ONLY): 25 min   Charges:   PT Evaluation $PT Eval Low Complexity: 1  Low PT Treatments $Therapeutic Exercise: 8-22 mins        Carmelia Bake, PT, DPT Acute Rehabilitation Services Office: [phone] Pager: [phone]  Trena Platt 03/31/2019, 12:24 PM

## 2019-03-31 NOTE — Progress Notes (Addendum)
ANTICOAGULATION CONSULT NOTE - Initial Consult  Pharmacy Consult for Coumadin Indication: VTE prophylaxis, hx Factor V Leiden/LA  Allergies  Allergen Reactions  . Codeine     hallucination    Patient Measurements: Height: 5\' 4"  (162.6 cm) Weight: 166 lb 4.8 oz (75.4 kg) IBW/kg (Calculated) : 54.7  Vital Signs: Temp: 97.6 F (36.4 C) (06/17 0536) Temp Source: Oral (06/16 2146) BP: 122/57 (06/17 0536) Pulse Rate: 63 (06/17 0536)  Labs: Recent Labs    03/30/19 1141 03/31/19 0327  HGB  --  10.7*  HCT  --  35.0*  PLT  --  204  LABPROT 12.4 13.5  INR 0.9 1.0  CREATININE  --  0.62    Estimated Creatinine Clearance: 62.3 mL/min (by C-G formula based on SCr of 0.62 mg/dL).   Medical History: Past Medical History:  Diagnosis Date  . Allergic rhinitis   . 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 Diabetes mellitus without complication ([place])    no meds-diet controlled  . Factor 5 Leiden mutation, heterozygous ([place])   . Heart murmur    mild mitral regurg-echo 2010  . Hyperlipidemia   . Hypertension   . Lupus anticoagulant positive   . Obesity   . Primary localized osteoarthritis of left hip 03/30/2019  . Stroke [facility]) 2010    Medications:  Coumadin PTA 5mg  daily- last dose 6/11.  Bridged with Lovenox 80mg  sq BID- last dose 6/15 @ 8pm  Assessment: 78 yoF admitted for elective THA.  She is on chronic Coumadin for clotting disorder.  INR at baseline since Coumadin held x5 days pre-op.   CBC WNL at baseline. No bleeding noted.  Anticipate post-op blood loss.    Today, 03/31/19 - INR subtherapeutic - Enoxaparin 75mg  sq q12h post-op as recommended by hematologist - Hgb low post-op, platelets WNL - No reported bleeding - No major DDI noted - Monitor for s/s of bleeding  Goal of Therapy:  INR 2-3 Monitor platelets by anticoagulation protocol: Yes   Plan:  Coumadin 7.5mg  po x1 today Bridge with Lovenox 75mg  sq q12h per MD Daily INR  Natale Lay, PharmD  Candidate 03/31/2019,8:40 AM   Netta Cedars, PharmD, BCPS 03/31/2019@9 :24 AM

## 2019-03-31 NOTE — Progress Notes (Signed)
Spoke with patient, doing better with the platform walker.   Working on getting coumadin therapeutic.   Plan dc home tomorrow.   Johnny Bridge, MD

## 2019-03-31 NOTE — Progress Notes (Signed)
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  Pt reports this assistive device is more helpful and allowed her to ambulate.  Pt would benefit from another PT session prior to d/c.   Follow Up Recommendations  Home health PT;Supervision/Assistance - 24 hour;Follow surgeon's recommendation for DC plan and follow-up therapies     Equipment Recommendations  Rolling walker with 5" wheels;Other (comment)(bil platforms)    Recommendations for Other Services       Precautions / Restrictions Precautions Precautions: Fall;Posterior Hip Precaution Comments: educated on posterior hip precautions Restrictions Other Position/Activity Restrictions: WBAT    Mobility  Bed Mobility Overal bed mobility: Needs Assistance Bed Mobility: Supine to Sit;Sit to Supine     Supine to sit: Min assist Sit to supine: Min assist   General bed mobility comments: assist for L LE, cues for maintaining precautions  Transfers Overall transfer level: Needs assistance Equipment used: Bilateral platform walker Transfers: Sit to/from Stand Sit to Stand: Min assist Stand pivot transfers: Min assist;+2 safety/equipment       General transfer comment: verbal cues for UE and LE positioning, assist to steady with performing arm placement, utilized bil platforms  Ambulation/Gait Ambulation/Gait assistance: Herbalist (Feet): 120 Feet Assistive device: Bilateral platform walker Gait Pattern/deviations: Step-to pattern;Decreased stance time - left;Decreased weight shift to right     General Gait Details: verbal cues for sequencing, forearm  positioning, achieving midline (tends to lean more to left side)   Stairs             Wheelchair Mobility    Modified Rankin (Stroke Patients Only)       Balance                                            Cognition Arousal/Alertness: Awake/alert Behavior During Therapy: WFL for tasks assessed/performed Overall Cognitive Status: Within Functional Limits for tasks assessed                                        Exercises     General Comments        Pertinent Vitals/Pain Pain Assessment: 0-10 Pain Score: 5  Pain Location: left hip Pain Descriptors / Indicators: Aching;Sore Pain Intervention(s): Limited activity within patient's tolerance;Monitored during session;Repositioned;Ice applied    Home Living Family/patient expects to be discharged to:: Private residence Living Arrangements: Alone Available Help at Discharge: Available 24 hours/day;Personal care attendant(plans to have 24/7 caregivers upon d/c) Type of Home: House Home Access: Level entry   Home Layout: One level Home Equipment: Cane - single point;Other (comment)(3 wheeled walker)      Prior Function Level of Independence: Independent with assistive device(s)      Comments: using 3 wheeled walker prior to admission   PT Goals (current goals can now be found in the care plan section) Acute Rehab PT Goals PT Goal Formulation: With patient Time For Goal Achievement: 04/06/19 Potential to  Achieve Goals: Good Progress towards PT goals: Progressing toward goals    Frequency    7X/week      PT Plan Current plan remains appropriate    Co-evaluation              AM-PAC PT "6 Clicks" Mobility   Outcome Measure  Help needed turning from your back to your side while in a flat bed without using bedrails?: A Little Help needed moving from lying on your back to sitting on the side of a flat bed without using bedrails?: A Little Help needed moving to and  from a bed to a chair (including a wheelchair)?: A Little Help needed standing up from a chair using your arms (e.g., wheelchair or bedside chair)?: A Little Help needed to walk in hospital room?: A Little Help needed climbing 3-5 steps with a railing? : A Lot 6 Click Score: 17    End of Session Equipment Utilized During Treatment: Gait belt Activity Tolerance: Patient tolerated treatment well Patient left: in bed;with call bell/phone within reach;with bed alarm set Nurse Communication: Mobility status PT Visit Diagnosis: Other abnormalities of gait and mobility (R26.89)     Time: 1340-1405 PT Time Calculation (min) (ACUTE ONLY): 25 min  Charges:  $Gait Training: 23-37 mins                     Carmelia Bake, PT, DPT Acute Rehabilitation Services Office: [phone] Pager: [phone]  Trena Platt 03/31/2019, 3:04 PM

## 2019-03-31 NOTE — Progress Notes (Addendum)
Patient ID: [name], female   DOB: Nov 28, 1945, 73 y.o.   MRN: [id number]     Subjective:  Patient reports pain as mild to moderate.  Patient in bed and has not been up with PT as of yet.  Objective:   VITALS:   Vitals:   03/30/19 2035 03/30/19 2146 03/31/19 0108 03/31/19 0536  BP: 115/61 (!) 107/56 (!) 115/54 (!) 122/57  Pulse: 88 93 68 63  Resp: 18 16 16 16   Temp: 97.7 F (36.5 C) (!) 97.5 F (36.4 C) 97.8 F (36.6 C) 97.6 F (36.4 C)  TempSrc: Oral Oral    SpO2: 99% 98% 96% 99%  Weight:      Height:        ABD soft Sensation intact distally Dorsiflexion/Plantar flexion intact Incision: dressing C/D/I and no drainage   Lab Results  Component Value Date   WBC 8.8 03/31/2019   HGB 10.7 (L) 03/31/2019   HCT 35.0 (L) 03/31/2019   MCV 102.3 (H) 03/31/2019   PLT 204 03/31/2019   BMET    Component Value Date/Time   NA 141 03/31/2019 0327   K 5.1 03/31/2019 0327   CL 108 03/31/2019 0327   CO2 24 03/31/2019 0327   GLUCOSE 146 (H) 03/31/2019 0327   BUN 11 03/31/2019 0327   CREATININE 0.62 03/31/2019 0327   CALCIUM 8.7 (L) 03/31/2019 0327   GFRNONAA >60 03/31/2019 0327   GFRAA >60 03/31/2019 0327     Assessment/Plan: 1 Day Post-Op   Principal Problem:   Primary localized osteoarthritis of left hip Active Problems:   Status post left hip replacement   Advance diet Up with therapy WBAT  Dry dressing PRN    Lunette Stands 03/31/2019, 7:53 AM  Anticipated LOS equal to or greater than 2 midnights due to - Age 64 and older with one or more of the following:  - Obesity  - Expected need for hospital services (PT, OT, Nursing) required for safe  discharge  - Anticipated need for postoperative skilled nursing care or inpatient rehab  - Active co-morbidities: Stroke, DVT/VTE and factor 5 leiden with complex anticoagulation needs, requires monitoring for bleeding complications OR   - Unanticipated findings during/Post Surgery: Slow post-op  progression: GI, pain control, mobility  - Patient is a high risk of re-admission due to: bleeding/clotting complications  Will convert to inpatient.  [name], MD Cell [phone]

## 2019-04-01 ENCOUNTER — Inpatient Hospital Stay (HOSPITAL_COMMUNITY): Payer: Medicare Other

## 2019-04-01 LAB — BASIC METABOLIC PANEL
Anion gap: 11 (ref 5–15)
BUN: 18 mg/dL (ref 8–23)
CO2: 22 mmol/L (ref 22–32)
Calcium: 8.5 mg/dL — ABNORMAL LOW (ref 8.9–10.3)
Chloride: 107 mmol/L (ref 98–111)
Creatinine, Ser: 0.61 mg/dL (ref 0.44–1.00)
GFR calc Af Amer: >60 mL/min (ref >60)
GFR calc non Af Amer: >60 mL/min (ref >60)
Glucose, Bld: 115 mg/dL — ABNORMAL HIGH (ref 70–99)
Potassium: 4.2 mmol/L (ref 3.5–5.1)
Sodium: 140 mmol/L (ref 135–145)

## 2019-04-01 LAB — CBC
HCT: 30.2 % — ABNORMAL LOW (ref 36.0–46.0)
Hemoglobin: 9.4 g/dL — ABNORMAL LOW (ref 12.0–15.0)
MCH: 31.3 pg (ref 26.0–34.0)
MCHC: 31.1 g/dL (ref 30.0–36.0)
MCV: 100.7 fL — ABNORMAL HIGH (ref 80.0–100.0)
Platelets: 189 10*3/uL (ref 150–400)
RBC: 3 MIL/uL — ABNORMAL LOW (ref 3.87–5.11)
RDW: 13 % (ref 11.5–15.5)
WBC: 11.2 10*3/uL — ABNORMAL HIGH (ref 4.0–10.5)
nRBC: 0 % (ref 0.0–0.2)

## 2019-04-01 LAB — GLUCOSE, CAPILLARY: Glucose-Capillary: 118 mg/dL — ABNORMAL HIGH (ref 70–99)

## 2019-04-01 LAB — PROTIME-INR
INR: 1.4 — ABNORMAL HIGH (ref 0.8–1.2)
Prothrombin Time: 17.1 seconds — ABNORMAL HIGH (ref 11.4–15.2)

## 2019-04-01 MED ORDER — WARFARIN SODIUM 5 MG PO TABS
5.0000 mg | ORAL_TABLET | Freq: Every day | ORAL | Status: AC
  Administered 2019-04-01: 5 mg via ORAL
  Filled 2019-04-01: qty 1

## 2019-04-01 NOTE — Plan of Care (Signed)
Plan of care reviewed and discussed with the patient. 

## 2019-04-01 NOTE — Discharge Summary (Addendum)
Physician Discharge Summary  Patient ID: [name] MRN: [id number] DOB/AGE: Dec 20, 1945 73 y.o.  Admit date: 03/30/2019 Discharge date: 04/02/2019  Admission Diagnoses:  Primary localized osteoarthritis of left hip  Discharge Diagnoses:  Principal Problem:   Primary localized osteoarthritis of left hip Active Problems:   Status post left hip replacement ABLA observed Hyperocoaguability: aggressive anticoagulation with lovenox 80 bid and coumadin bridge per hematology  Past Medical History:  Diagnosis Date  . Allergic rhinitis   . 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 Diabetes mellitus without complication ([place])    no meds-diet controlled  . Factor 5 Leiden mutation, heterozygous ([place])   . Heart murmur    mild mitral regurg-echo 2010  . Hyperlipidemia   . Hypertension   . Lupus anticoagulant positive   . Obesity   . Primary localized osteoarthritis of left hip 03/30/2019  . Stroke [facility]) 2010    Surgeries: Procedure(s): TOTAL HIP ARTHROPLASTY on 03/30/2019   Consultants (if any):   Discharged Condition: Improved  Hospital Course: [name] is an 73 y.o. female who was admitted 03/30/2019 with a diagnosis of Primary localized osteoarthritis of left hip and went to the operating room on 03/30/2019 and underwent the above named procedures.    She was given perioperative antibiotics:  Anti-infectives (From admission, onward)   Start     Dose/Rate Route Frequency Ordered Stop   03/30/19 2000  ceFAZolin (ANCEF) IVPB 2g/100 mL premix     2 g 200 mL/hr over 30 Minutes Intravenous Every 6 hours 03/30/19 1726 03/31/19 0358   03/30/19 1130  ceFAZolin (ANCEF) IVPB 2g/100 mL premix     2 g 200 mL/hr over 30 Minutes Intravenous On call to O.R. 03/30/19 1118 03/30/19 1410    .  She was given sequential compression devices, early ambulation, and lovenox bridging back to coumadin for DVT prophylaxis.  She benefited maximally from the hospital stay and there were no  complications.  She did have a fall with a syncopal episode with PT on 6/18 which delayed discharge.  XR looked ok, and she improved overnight after stopping hydrocodone.  Switched to tylenol and tramadol.    ABLA was observed.  Will recheck h/h next week.  Recent vital signs:  Vitals:   04/01/19 2137 04/02/19 0445  BP: (!) 124/58 (!) 112/57  Pulse: 77 77  Resp: 16 16  Temp: 98.1 F (36.7 C) 99.6 F (37.6 C)  SpO2: 96% 93%    Recent laboratory studies:  Lab Results  Component Value Date   HGB 8.5 (L) 04/02/2019   HGB 9.4 (L) 04/01/2019   HGB 10.7 (L) 03/31/2019   Lab Results  Component Value Date   WBC 10.7 (H) 04/02/2019   PLT 179 04/02/2019   Lab Results  Component Value Date   INR 1.8 (H) 04/02/2019   Lab Results  Component Value Date   NA 140 04/01/2019   K 4.2 04/01/2019   CL 107 04/01/2019   CO2 22 04/01/2019   BUN 18 04/01/2019   CREATININE 0.61 04/01/2019   GLUCOSE 115 (H) 04/01/2019    Discharge Medications:   Allergies as of 04/02/2019      Reactions   Codeine    hallucination      Medication List    TAKE these medications   acetaminophen 650 MG CR tablet Commonly known as: TYLENOL Take 1,300 mg by mouth every 8 (eight) hours.   amLODipine 5 MG tablet Commonly known as: NORVASC Take 5  mg by mouth 2 (two) times daily.   aspirin 81 MG tablet Take 81 mg by mouth daily.   B COMPLEX PO Take 1 tablet by mouth daily.   baclofen 10 MG tablet Commonly known as: LIORESAL Take 1 tablet (10 mg total) by mouth 3 (three) times daily. As needed for muscle spasm   BIOTIN PO Take 1 tablet by mouth daily.   CALCIUM PO Take 2 tablets by mouth daily.   cholecalciferol 1000 units tablet Commonly known as: VITAMIN D Take 1,000 Units by mouth daily.   diclofenac sodium 1 % Gel Commonly known as: VOLTAREN Apply 1 application topically 3 (three) times daily as needed (pain).   enoxaparin 80 MG/0.8ML injection Commonly known as: LOVENOX Inject 80  mg into the skin.   escitalopram 10 MG tablet Commonly known as: LEXAPRO Take 10 mg by mouth daily.   ezetimibe 10 MG tablet Commonly known as: ZETIA Take 5 mg by mouth 3 (three) times a week. Notes to patient: Take as prescribed   fluticasone 50 MCG/ACT nasal spray Commonly known as: FLONASE Place 2 sprays into both nostrils daily as needed for allergies.   lisinopril 10 MG tablet Commonly known as: ZESTRIL Take 10 mg by mouth 2 (two) times daily.   Magnesium 250 MG Tabs Take 250 mg by mouth daily.   ondansetron 4 MG tablet Commonly known as: Zofran Take 1 tablet (4 mg total) by mouth every 8 (eight) hours as needed for nausea or vomiting.   sennosides-docusate sodium 8.6-50 MG tablet Commonly known as: SENOKOT-S Take 2 tablets by mouth daily.   simvastatin 20 MG tablet Commonly known as: ZOCOR Take 20 mg by mouth at bedtime.   traMADol 50 MG tablet Commonly known as: Ultram Take 1 tablet (50 mg total) by mouth every 6 (six) hours as needed.   vitamin B-12 1000 MCG tablet Commonly known as: CYANOCOBALAMIN Take 1,000 mcg by mouth daily.   warfarin 5 MG tablet Commonly known as: COUMADIN Take 5 mg by mouth daily. Take 5 mg by mouth daily            Durable Medical Equipment  (From admission, onward)         Start     Ordered   03/31/19 1612  For home use only DME Walker rolling  Once    Comments: Walker with 5" front wheels and bilateral upper extremity platforms due to patient's right hemiparesis from prior CVA and status post left total hip replacement using posterior approach.  Question:  Patient needs a walker to treat with the following condition  Answer:  Status post total hip replacement, left   03/31/19 1613   03/31/19 1452  For home use only DME Walker rolling  Once    Comments: Needs RW and bilateral platforms for upper extremities  Question:  Patient needs a walker to treat with the following condition  Answer:  H/O joint surgery   03/31/19  1453          Diagnostic Studies: Dg Pelvis Portable  Result Date: 04/01/2019 CLINICAL DATA:  Status post left hip replacement 03/30/2019. The patient suffered a fall today during physical therapy. Initial encounter. EXAM: PORTABLE PELVIS 1-2 VIEWS COMPARISON:  Plain films left hip 03/30/2019. FINDINGS: Left hip arthroplasty is in place. The device is located. No fracture. Right hip osteoarthritis noted. Soft tissues unremarkable. IMPRESSION: No acute abnormality. Left hip arthroplasty in place without evidence of complication. Right hip osteoarthritis. Electronically Signed   By: Marcello Moores  Dalessio M.D.   On: 04/01/2019 15:13   Dg Pelvis Portable  Result Date: 03/30/2019 CLINICAL DATA:  Status post left hip arthroplasty. EXAM: PORTABLE PELVIS 1-2 VIEWS COMPARISON:  None. FINDINGS: Left hip arthroplasty in expected alignment. No periprosthetic lucency or fracture. Recent postsurgical change includes air and edema in the soft tissues and joint. Moderate degenerative change of the right hip. IMPRESSION: Left hip arthroplasty without immediate postoperative complication. Electronically Signed   By: Keith Rake M.D.   On: 03/30/2019 19:42   Dg Hip Port Unilat With Pelvis 1v Left  Result Date: 03/30/2019 CLINICAL DATA:  Hip replacement EXAM: DG HIP (WITH OR WITHOUT PELVIS) 1V PORT LEFT COMPARISON:  None. FINDINGS: There are expected postsurgical changes related to total hip arthroplasty on the left. Alignment appears near anatomic. There is subcutaneous gas and overlying soft tissue edema. There are advanced degenerative changes of the right femoroacetabular joint peer meters. IMPRESSION: Expected postsurgical changes related to total hip arthroplasty on the left. Electronically Signed   By: Constance Holster M.D.   On: 03/30/2019 19:38    Disposition:      Follow-up Information    [name], MD. Go on 04/12/2019.   Specialty: Orthopedic Surgery Why: your appointment is set for 12:00   Contact information: [address] [place] [address] [phone]        [facility] up.   Specialty: [place] Why: You will be seen at home by [place] as prior to surgery until OPPT is started  Contact information: [address] [place] [address] [phone]        [facility], [place]. Go on 04/13/2019.   Why: You are scheduled to start outpatient physical therapy at 11:00. Please arrive a few minutes early to complete your paperwork  Contact information: [facility] [place] [address] [phone]            Signed: Johnny Bridge 04/02/2019, 8:01 AM

## 2019-04-01 NOTE — Progress Notes (Signed)
     Subjective:  Patient reports pain as moderate.  Had a vagal episode with pt associated with nausea.  Feeling back to normal now.  Objective:   VITALS:   Vitals:   03/31/19 2132 04/01/19 0551 04/01/19 1013 04/01/19 1210  BP: (!) 122/57 (!) 117/56 (!) 125/57 (!) 124/56  Pulse: 71 71 75 78  Resp: 16 18 18 20   Temp: 98.6 F (37 C) 97.8 F (36.6 C) 97.7 F (36.5 C)   TempSrc: Oral     SpO2: 96% 97% 94% 93%  Weight:      Height:        Dorsiflexion/Plantar flexion intact Incision: scant drainage   XR looks good.  Lab Results  Component Value Date   WBC 11.2 (H) 04/01/2019   HGB 9.4 (L) 04/01/2019   HCT 30.2 (L) 04/01/2019   MCV 100.7 (H) 04/01/2019   PLT 189 04/01/2019   BMET    Component Value Date/Time   NA 140 04/01/2019 0325   K 4.2 04/01/2019 0325   CL 107 04/01/2019 0325   CO2 22 04/01/2019 0325   GLUCOSE 115 (H) 04/01/2019 0325   BUN 18 04/01/2019 0325   CREATININE 0.61 04/01/2019 0325   CALCIUM 8.5 (L) 04/01/2019 0325   GFRNONAA >60 04/01/2019 0325   GFRAA >60 04/01/2019 0325     Assessment/Plan: 2 Days Post-Op   Principal Problem:   Primary localized osteoarthritis of left hip Active Problems:   Status post left hip replacement   Advance diet Up with therapy Dc likely tomorrow.  Continue titrating lovenox bridge to coumadin.    Anticipated LOS equal to or greater than 2 midnights due to - Age 73 and older with one or more of the following:  - Obesity  - Expected need for hospital services (PT, OT, Nursing) required for safe  discharge  - Anticipated need for postoperative skilled nursing care or inpatient rehab  - Active co-morbidities: Stroke, DVT/VTE and Anemia OR   - Unanticipated findings during/Post Surgery: Slow post-op progression: GI, pain control, mobility  - Patient is a high risk of re-admission due to: syncopal epsiode today     Johnny Bridge 04/01/2019, 3:12 PM   [name], MD Cell [phone]

## 2019-04-01 NOTE — TOC Transition Note (Signed)
Transition of Care [facility]) - CM/SW Discharge Note   Patient Details  Name: [name] MRN: [id number] Date of Birth: 1945-12-01  Transition of Care [facility]) CM/SW Contact:  Lia Hopping, [place] Phone Number: 04/01/2019, 10:24 AM   Clinical Narrative:    [place] delivered. CSW notified Ortho. Case Manager Ong.   Final next level of care: [place] Barriers to Discharge: No Barriers Identified   Patient Goals and CMS Choice        Discharge Placement                       Discharge Plan and Services                DME Arranged: Walker platform DME Agency: [employer] Date DME Agency Contacted: 03/31/19 Time DME Agency Contacted: [phone] Representative spoke with at DME Agency: [place]: PT, RN Ramos Agency: [facility] ([facility])        Social Determinants of Health ([place]) Interventions     Readmission Risk Interventions No flowsheet data found.

## 2019-04-01 NOTE — Progress Notes (Signed)
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  Pt assisted with standing and taking a few steps however nausea returned, and pt reports feeling like this morning so returned safely to bed (+2 for session for safety).  Pt with plans to remain inpatient tonight.    Follow Up Recommendations  Home health PT;Supervision/Assistance - 24 hour;Follow surgeon's recommendation for DC plan and follow-up therapies     Equipment Recommendations  Rolling walker with 5" wheels;Other (comment)    Recommendations for Other Services       Precautions / Restrictions Precautions Precautions: Fall;Posterior Hip Precaution Comments: reviewed posterior hip precautions Restrictions Other Position/Activity Restrictions: WBAT    Mobility  Bed Mobility Overal bed mobility: Needs Assistance Bed Mobility: Supine to Sit;Sit to Supine     Supine to sit: Mod assist;+2 for physical assistance Sit to supine: Total assist;+2 for physical assistance   General bed mobility comments: assist for L LE, cues for maintaining precautions,assist for scooting to EOB; requiring more assist upon return to bed due to nausea  Transfers Overall transfer level: Needs assistance Equipment used: Bilateral platform walker Transfers: Sit to/from Stand Sit to Stand: Min assist         General transfer comment: verbal cues for UE and LE positioning, assist to steady with performing arm placement, utilized bil platforms  Ambulation/Gait Ambulation/Gait assistance: Herbalist (Feet): 2 Feet Assistive device: Bilateral platform walker Gait Pattern/deviations: Step-to pattern;Decreased stance time - left;Decreased weight shift to right      General Gait Details: verbal cues for sequencing, forearm positioning, achieving midline (tends to lean more to left side); pt attempting to take steps however great difficulty with advancing R LE; pt then reported nausea so took a couple steps back to bed; pt reports feeling like this morning and requested return to supine; vitals in flowsheet (WNL) and RN notified; pt believes nausea from pain meds   Stairs             Wheelchair Mobility    Modified Rankin (Stroke Patients Only)       Balance                                            Cognition Arousal/Alertness: Awake/alert Behavior During Therapy: WFL for tasks assessed/performed Overall Cognitive Status: Within Functional Limits for tasks assessed                                        Exercises      General Comments        Pertinent Vitals/Pain Pain Assessment: 0-10 Pain Score: 5  Pain Location: left hip Pain Descriptors / Indicators: Aching;Sore Pain Intervention(s): Limited activity within patient's tolerance;Repositioned;Monitored during session;Ice applied    Home Living                      Prior Function            PT Goals (current goals can now be found in  the care plan section) Progress towards PT goals: Not progressing toward goals - comment(limited by nausea)    Frequency    7X/week      PT Plan Current plan remains appropriate    Co-evaluation              AM-PAC PT "6 Clicks" Mobility   Outcome Measure  Help needed turning from your back to your side while in a flat bed without using bedrails?: A Little Help needed moving from lying on your back to sitting on the side of a flat bed without using bedrails?: A Lot Help needed moving to and from a bed to a chair (including a wheelchair)?: A Lot Help needed standing up from a chair using your arms (e.g., wheelchair or bedside chair)?: A Little Help needed to walk in hospital  room?: A Lot Help needed climbing 3-5 steps with a railing? : A Lot 6 Click Score: 14    End of Session Equipment Utilized During Treatment: Gait belt Activity Tolerance: Patient tolerated treatment well Patient left: in bed;with call bell/phone within reach;with bed alarm set Nurse Communication: Mobility status PT Visit Diagnosis: Other abnormalities of gait and mobility (R26.89)     Time: 2575-0518 PT Time Calculation (min) (ACUTE ONLY): 17 min  Charges:  $Gait Training: 8-22 mins                     Carmelia Bake, PT, DPT Acute Rehabilitation Services Office: [phone] Pager: [place] 04/01/2019, 4:22 PM

## 2019-04-01 NOTE — Progress Notes (Signed)
ANTICOAGULATION CONSULT NOTE - Initial Consult  Pharmacy Consult for Coumadin Indication: VTE prophylaxis, hx Factor V Leiden/LA  Allergies  Allergen Reactions  . Codeine     hallucination    Patient Measurements: Height: 5\' 4"  (162.6 cm) Weight: 166 lb 4.8 oz (75.4 kg) IBW/kg (Calculated) : 54.7  Vital Signs: Temp: 97.8 F (36.6 C) (06/18 0551) Temp Source: Oral (06/17 2132) BP: 117/56 (06/18 0551) Pulse Rate: 71 (06/18 0551)  Labs: Recent Labs    03/30/19 1141 03/31/19 0327 04/01/19 0325  HGB  --  10.7* 9.4*  HCT  --  35.0* 30.2*  PLT  --  204 189  LABPROT 12.4 13.5 17.1*  INR 0.9 1.0 1.4*  CREATININE  --  0.62 0.61    Estimated Creatinine Clearance: 62.3 mL/min (by C-G formula based on SCr of 0.61 mg/dL).   Medical History: Past Medical History:  Diagnosis Date  . Allergic rhinitis   . Clotting disorder ([place])    Factor 5  . Diabetes mellitus without complication ([place])    no meds-diet controlled  . Factor 5 Leiden mutation, heterozygous ([place])   . Heart murmur    mild mitral regurg-echo 2010  . Hyperlipidemia   . Hypertension   . Lupus anticoagulant positive   . Obesity   . Primary localized osteoarthritis of left hip 03/30/2019  . Stroke [facility]) 2010    Medications:  Coumadin PTA 5mg  daily- last dose 6/11.  Bridged with Lovenox 80mg  sq BID- last dose 6/15 @ 8pm  Assessment: 24 yoF admitted for elective THA.  She is on chronic Coumadin for clotting disorder.  INR at baseline since Coumadin held x5 days pre-op.   CBC WNL at baseline. No bleeding noted.  Anticipate post-op blood loss.    Today, 04/01/19 - INR subtherapeutic but trending upwards - Enoxaparin 75mg  sq q12h post-op as recommended by hematologist - last dose @ 1600 6/17 - Hgb low post-op trending down, platelets WNL - No reported bleeding - No major DDI noted - Monitor for s/s of bleeding  Goal of Therapy:  INR 2-3 Monitor platelets by anticoagulation protocol: Yes   Plan:   Coumadin 5mg  po x1 today Bridge with Lovenox 75mg  sq q12h per MD Daily INR

## 2019-04-01 NOTE — Progress Notes (Signed)
During PT session, pt complained of nausea and became blank. Pt did have a witnesses fall during PT session, landing on buttocks and did break posterior hip precautions per therapist.  Therapist was able to assist pt to the floor.   Pt assisted back to bed via lift.  Pt alert and oriented, stating she only felt nausea before passing out. No new pain, swelling, therapist stated she did not hear pop when pt landed on floor.  MD and PA paged, awaiting response.   Vitals stable: bp 124/56, HR 78, O2 93% RA.  CBG 118.  A/O x4. Pt resting in bed.

## 2019-04-01 NOTE — Progress Notes (Signed)
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  Pt with plans for d/c home today.  Pt assisted with ambulating (wearing gait belt) however had nonresponsive/?vasovagal episode and required safe assistance to be lowered to the floor.  RN and staff into room to assist with assessing pt,  and pt back to bed via hoyer lift.  Pt reports no pain upon returning to bed.  Pt states she only felt nausea with ambulating, no dizziness.    Follow Up Recommendations  Home health PT;Supervision/Assistance - 24 hour;Follow surgeon's recommendation for DC plan and follow-up therapies     Equipment Recommendations  Rolling walker with 5" wheels;Other (comment)    Recommendations for Other Services       Precautions / Restrictions Precautions Precautions: Fall;Posterior Hip Precaution Comments: reviewed posterior hip precautions Restrictions Other Position/Activity Restrictions: WBAT    Mobility  Bed Mobility               General bed mobility comments: pt up in recliner on arrival, back to bed via hoyer lift  Transfers Overall transfer level: Needs assistance Equipment used: Bilateral platform walker Transfers: Sit to/from Stand Sit to Stand: Min assist         General transfer comment: verbal cues for UE and LE positioning, assist to steady with performing arm placement, utilized bil platforms  Ambulation/Gait Ambulation/Gait assistance: Min assist;Total assist Gait Distance (Feet): 5 Feet Assistive device: Bilateral platform walker Gait Pattern/deviations: Step-to pattern;Decreased stance time - left;Decreased weight shift to right     General Gait Details: verbal cues  for sequencing, forearm positioning, achieving midline (tends to lean more to left side), pt more unsteady today despite cues, reported nausea, pt then with ?vasovagal? episode and assisted safely to the floor   Stairs             Wheelchair Mobility    Modified Rankin (Stroke Patients Only)       Balance                                            Cognition Arousal/Alertness: Awake/alert Behavior During Therapy: WFL for tasks assessed/performed Overall Cognitive Status: Within Functional Limits for tasks assessed                                        Exercises      General Comments        Pertinent Vitals/Pain Pain Score: 5  Pain Location: left hip Pain Descriptors / Indicators: Aching;Sore Pain Intervention(s): Repositioned;Monitored during session;Premedicated before session    Home Living                      Prior Function            PT Goals (current goals can now be found in the care plan section) Progress towards PT goals: Progressing toward goals    Frequency    7X/week  PT Plan Current plan remains appropriate    Co-evaluation              AM-PAC PT "6 Clicks" Mobility   Outcome Measure  Help needed turning from your back to your side while in a flat bed without using bedrails?: A Little Help needed moving from lying on your back to sitting on the side of a flat bed without using bedrails?: A Little Help needed moving to and from a bed to a chair (including a wheelchair)?: A Little Help needed standing up from a chair using your arms (e.g., wheelchair or bedside chair)?: A Little Help needed to walk in hospital room?: Total Help needed climbing 3-5 steps with a railing? : Total 6 Click Score: 14    End of Session Equipment Utilized During Treatment: Gait belt Activity Tolerance: Patient tolerated treatment well Patient left: in bed;with call bell/phone within reach;with bed  alarm set Nurse Communication: Mobility status PT Visit Diagnosis: Other abnormalities of gait and mobility (R26.89)     Time: 0240-9735 PT Time Calculation (min) (ACUTE ONLY): 26 min  Charges:  $Gait Training: 8-22 mins                     Carmelia Bake, PT, DPT Acute Rehabilitation Services Office: [phone] Pager: [place] 04/01/2019, 2:53 PM

## 2019-04-01 NOTE — Care Management Important Message (Signed)
Important Message  Patient Details  Name: [name] MRN: [id number] Date of Birth: 04-12-1946   Medicare Important Message Given:  Yes. CMA printed out IM for Case Manager Nurse, Velva Harman to give to patient.     Clay Solum 04/01/2019, 9:20 AM

## 2019-04-02 LAB — CBC
HCT: 27.3 % — ABNORMAL LOW (ref 36.0–46.0)
Hemoglobin: 8.5 g/dL — ABNORMAL LOW (ref 12.0–15.0)
MCH: 32.1 pg (ref 26.0–34.0)
MCHC: 31.1 g/dL (ref 30.0–36.0)
MCV: 103 fL — ABNORMAL HIGH (ref 80.0–100.0)
Platelets: 179 10*3/uL (ref 150–400)
RBC: 2.65 MIL/uL — ABNORMAL LOW (ref 3.87–5.11)
RDW: 13.3 % (ref 11.5–15.5)
WBC: 10.7 10*3/uL — ABNORMAL HIGH (ref 4.0–10.5)
nRBC: 0 % (ref 0.0–0.2)

## 2019-04-02 LAB — PROTIME-INR
INR: 1.8 — ABNORMAL HIGH (ref 0.8–1.2)
Prothrombin Time: 20.2 seconds — ABNORMAL HIGH (ref 11.4–15.2)

## 2019-04-02 MED ORDER — TRAMADOL HCL 50 MG PO TABS: 50.0000 mg | ORAL_TABLET | Freq: Four times a day (QID) | ORAL | 0 refills | Status: AC | PRN

## 2019-04-02 MED ORDER — WARFARIN SODIUM 5 MG PO TABS: 5.0000 mg | ORAL_TABLET | Freq: Every day | ORAL | Status: DC

## 2019-04-02 NOTE — Progress Notes (Signed)
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  Pt also has great difficulty advancing R LE due to foot drop so recommended pt return to prosthesis fitting or MD for f/u on AFO (she has had one in the past but it did not fit well so she has not worn it in years). Pt reports she has a caregiver 24/7 upon d/c and she lives in [place] and has access to using w/c.  Pt encouraged to use w/c initially for safety and work on ambulating with PT.  Pt agreeable and realizes safety concerns.  Pt provided with HEP handout and verbally reviewed.  Also provided with posterior hip precautions handout.  Pt feels ready for d/c home today.   Follow Up Recommendations  Home health PT;Supervision/Assistance - 24 hour;Follow surgeon's recommendation for DC plan and follow-up therapies     Equipment Recommendations  None recommended by PT(platform RW delivered to room)    Recommendations for Other Services       Precautions / Restrictions Precautions Precautions: Fall;Posterior Hip Precaution Comments: reviewed posterior hip precautions Restrictions Other Position/Activity Restrictions: WBAT    Mobility  Bed Mobility Overal bed mobility: Needs Assistance Bed Mobility: Supine to Sit     Supine to sit: Mod assist;+2 for physical assistance     General bed mobility comments: assist for L LE, cues for maintaining precautions,assist for scooting to EOB  Transfers Overall transfer level: Needs assistance Equipment used: Bilateral platform walker Transfers: Sit to/from Stand Sit to Stand: Min assist;+2 safety/equipment         General transfer comment: verbal cues for UE and LE  positioning, assist to steady with performing arm placement, utilized bil platforms  Ambulation/Gait Ambulation/Gait assistance: Min assist;+2 safety/equipment Gait Distance (Feet): 30 Feet Assistive device: Bilateral platform walker Gait Pattern/deviations: Step-to pattern;Decreased stance time - left;Decreased weight shift to right     General Gait Details: verbal cues for sequencing, forearm positioning, achieving midline (tends to lean more to left side); pt attempting to take steps however great difficulty with advancing R LE due to foot drop, assist for advancing R LE, distance to tolerance   Stairs             Wheelchair Mobility    Modified Rankin (Stroke Patients Only)       Balance                                            Cognition Arousal/Alertness: Awake/alert Behavior During Therapy: WFL for tasks assessed/performed Overall Cognitive Status: Within Functional Limits for tasks assessed                                        Exercises      General Comments        Pertinent Vitals/Pain Pain Assessment: 0-10 Pain Score: 5  Pain Location: left hip Pain Descriptors / Indicators: Aching;Sore Pain Intervention(s): Limited activity within patient's tolerance;Monitored during session;Repositioned;Ice applied  Home Living                      Prior Function            PT Goals (current goals can now be found in the care plan section) Progress towards PT goals: Progressing toward goals    Frequency    7X/week      PT Plan Current plan remains appropriate    Co-evaluation              AM-PAC PT "6 Clicks" Mobility   Outcome Measure  Help needed turning from your back to your side while in a flat bed without using bedrails?: A Little Help needed moving from lying on your back to sitting on the side of a flat bed without using bedrails?: A Lot Help needed moving to and from a bed to a  chair (including a wheelchair)?: A Little Help needed standing up from a chair using your arms (e.g., wheelchair or bedside chair)?: A Little Help needed to walk in hospital room?: A Little Help needed climbing 3-5 steps with a railing? : A Lot 6 Click Score: 16    End of Session Equipment Utilized During Treatment: Gait belt Activity Tolerance: Patient tolerated treatment well Patient left: with call bell/phone within reach;in chair;with chair alarm set Nurse Communication: Mobility status PT Visit Diagnosis: Other abnormalities of gait and mobility (R26.89)     Time: 9211-9417 PT Time Calculation (min) (ACUTE ONLY): 21 min  Charges:  $Gait Training: 8-22 mins                     Carmelia Bake, PT, DPT Acute Rehabilitation Services Office: [phone] Pager: [phone]  Trena Platt 04/02/2019, 1:45 PM

## 2019-04-02 NOTE — Progress Notes (Signed)
     Subjective:  Patient reports pain as mild.  Feels much better today, only taking tylenol.  Objective:   VITALS:   Vitals:   04/01/19 1554 04/01/19 1728 04/01/19 2137 04/02/19 0445  BP: (!) 127/59 120/65 (!) 124/58 (!) 112/57  Pulse: 62 79 77 77  Resp:  16 16 16   Temp:  98.3 F (36.8 C) 98.1 F (36.7 C) 99.6 F (37.6 C)  TempSrc:  Oral Oral Oral  SpO2: 97% 99% 96% 93%  Weight:      Height:        Dorsiflexion/Plantar flexion intact Incision: scant drainage   Lab Results  Component Value Date   WBC 10.7 (H) 04/02/2019   HGB 8.5 (L) 04/02/2019   HCT 27.3 (L) 04/02/2019   MCV 103.0 (H) 04/02/2019   PLT 179 04/02/2019   BMET    Component Value Date/Time   NA 140 04/01/2019 0325   K 4.2 04/01/2019 0325   CL 107 04/01/2019 0325   CO2 22 04/01/2019 0325   GLUCOSE 115 (H) 04/01/2019 0325   BUN 18 04/01/2019 0325   CREATININE 0.61 04/01/2019 0325   CALCIUM 8.5 (L) 04/01/2019 0325   GFRNONAA >60 04/01/2019 0325   GFRAA >60 04/01/2019 0325     Assessment/Plan: 3 Days Post-Op   Principal Problem:   Primary localized osteoarthritis of left hip Active Problems:   Status post left hip replacement   Advance diet Up with therapy Discharge home with home health ABLA, observe, might recheck h/h with INR checks at home next week.    Johnny Bridge 04/02/2019, 7:57 AM   [name], MD Cell [phone]

## 2019-04-02 NOTE — TOC Transition Note (Signed)
Transition of Care [facility]) - CM/SW Discharge Note   Patient Details  Name: [name] MRN: [id number] Date of Birth: 1945-11-24  Transition of Care [facility]) CM/SW Contact:  Leeroy Cha, RN Phone Number: 04/02/2019, 11:16 AM   Clinical Narrative:    dcd to home with HEP and wheelchair.   Final next level of care: Home/Self Care Barriers to Discharge: No Barriers Identified   Patient Goals and CMS Choice Patient states their goals for this hospitalization and ongoing recovery are:: to go home and do my excercises CMS Medicare.gov Compare Post Acute Care list provided to:: Patient Choice offered to / list presented to : Patient  Discharge Placement                       Discharge Plan and Services   Discharge Planning Services: CM Consult Post Acute Care Choice: Durable Medical Equipment          DME Arranged: Wheelchair manual DME Agency: [employer] Date DME Agency Contacted: 04/02/19 Time DME Agency Contacted: [id number] Representative spoke with at DME Agency: [place] Arranged: PT, RN [place] Agency: [facility] [employer] [facility] [employer]        Social Determinants of Health ([place]) Interventions     Readmission Risk Interventions No flowsheet data found.

## 2019-04-02 NOTE — Progress Notes (Signed)
    Durable Medical Equipment  (From admission, onward)         Start     Ordered   04/02/19 1101  For home use only DME standard manual wheelchair with seat cushion  Once    Comments: Patient suffers from right sided weakness which impairs their ability to perform daily activities in the home.  A platform walker will not resolve issue with performing activities of daily living. A wheelchair will allow patient to safely perform daily activities. Patient can safely propel the wheelchair in the home or has a caregiver who can provide assistance.  Accessories: elevating leg rests (ELRs), wheel locks, extensions and anti-tippers.   04/02/19 1104   03/31/19 1612  For home use only DME Walker rolling  Once    Comments: Walker with 5" front wheels and bilateral upper extremity platforms due to patient's right hemiparesis from prior CVA and status post left total hip replacement using posterior approach.  Question:  Patient needs a walker to treat with the following condition  Answer:  Status post total hip replacement, left   03/31/19 1613   03/31/19 1452  For home use only DME Walker rolling  Once    Comments: Needs RW and bilateral platforms for upper extremities  Question:  Patient needs a walker to treat with the following condition  Answer:  H/O joint surgery   03/31/19 1453

## 2019-04-02 NOTE — Plan of Care (Signed)
  Problem: Education: Goal: Understanding of discharge needs will improve Outcome: Adequate for Discharge   Problem: Activity: Goal: Ability to avoid complications of mobility impairment will improve Outcome: Adequate for Discharge Goal: Ability to tolerate increased activity will improve Outcome: Adequate for Discharge   Problem: Clinical Measurements: Goal: Postoperative complications will be avoided or minimized Outcome: Adequate for Discharge   Problem: Skin Integrity: Goal: Will show signs of wound healing Outcome: Adequate for Discharge   Problem: Health Behavior/Discharge Planning: Goal: Ability to manage health-related needs will improve Outcome: Adequate for Discharge   Problem: Clinical Measurements: Goal: Ability to maintain clinical measurements within normal limits will improve Outcome: Adequate for Discharge Goal: Will remain free from infection Outcome: Adequate for Discharge Goal: Diagnostic test results will improve Outcome: Adequate for Discharge   Problem: Elimination: Goal: Will not experience complications related to bowel motility Outcome: Adequate for Discharge

## 2019-04-02 NOTE — Progress Notes (Addendum)
ANTICOAGULATION CONSULT NOTE - Initial Consult  Pharmacy Consult for Coumadin Indication: VTE prophylaxis, hx Factor V Leiden/LA  Allergies  Allergen Reactions  . Codeine     hallucination    Patient Measurements: Height: 5\' 4"  (162.6 cm) Weight: 166 lb 4.8 oz (75.4 kg) IBW/kg (Calculated) : 54.7  Vital Signs: Temp: 99.6 F (37.6 C) (06/19 0445) Temp Source: Oral (06/19 0445) BP: 112/57 (06/19 0445) Pulse Rate: 77 (06/19 0445)  Labs: Recent Labs    03/31/19 0327 04/01/19 0325 04/02/19 0316  HGB 10.7* 9.4* 8.5*  HCT 35.0* 30.2* 27.3*  PLT 204 189 179  LABPROT 13.5 17.1* 20.2*  INR 1.0 1.4* 1.8*  CREATININE 0.62 0.61  --     Estimated Creatinine Clearance: 62.3 mL/min (by C-G formula based on SCr of 0.61 mg/dL).   Medical History: Past Medical History:  Diagnosis Date  . Allergic rhinitis   . Clotting disorder ([place])    Factor 5  . Diabetes mellitus without complication ([place])    no meds-diet controlled  . Factor 5 Leiden mutation, heterozygous ([place])   . Heart murmur    mild mitral regurg-echo 2010  . Hyperlipidemia   . Hypertension   . Lupus anticoagulant positive   . Obesity   . Primary localized osteoarthritis of left hip 03/30/2019  . Stroke [facility] 2010    Medications:  Coumadin PTA 5mg  daily- last dose 6/11.  Bridged with Lovenox 80mg  sq BID- last dose 6/15 @ 8pm  Assessment: 21 yoF admitted for elective THA.  She is on chronic Coumadin for clotting disorder.  INR at baseline since Coumadin held x5 days pre-op.   CBC WNL at baseline. No bleeding noted.  Anticipate post-op blood loss.    Today, 04/02/2019 - INR subtherapeutic but trending upwards - Enoxaparin 75mg  sq q12h post-op as recommended by hematologist - Hgb low post-op trending down, platelets WNL - No reported bleeding - No major DDI noted - Monitor for s/s of bleeding  Goal of Therapy:  INR 2-3 Monitor platelets by anticoagulation protocol: Yes   Plan:  Coumadin 5mg  po x1  today Bridge with Lovenox 75mg  sq q12h per MD Daily INR  Netta Cedars, PharmD, BCPS 04/02/2019@7 :53 AM

## 2019-04-02 NOTE — Progress Notes (Signed)
Wheelchair delivered to room. Pt ready for d/c home. Family on their way to pick up pt. RN will monitor.

## 2019-04-02 NOTE — Progress Notes (Signed)
Discharge paperwork discussed with pt at the bedside.  Pt did much better today with PT. She demonstrated understanding.  Awaiting family to pick pt up for d/c.  Equipment already in room.

## 2019-04-03 DIAGNOSIS — Z471 Aftercare following joint replacement surgery: Secondary | ICD-10-CM | POA: Diagnosis not present

## 2019-04-03 DIAGNOSIS — Z7901 Long term (current) use of anticoagulants: Secondary | ICD-10-CM | POA: Diagnosis not present

## 2019-04-03 DIAGNOSIS — Z5181 Encounter for therapeutic drug level monitoring: Secondary | ICD-10-CM | POA: Diagnosis not present

## 2019-04-03 DIAGNOSIS — D6862 Lupus anticoagulant syndrome: Secondary | ICD-10-CM | POA: Diagnosis not present

## 2019-04-03 DIAGNOSIS — D689 Coagulation defect, unspecified: Secondary | ICD-10-CM | POA: Diagnosis not present

## 2019-04-03 DIAGNOSIS — E669 Obesity, unspecified: Secondary | ICD-10-CM | POA: Diagnosis not present

## 2019-04-03 DIAGNOSIS — I10 Essential (primary) hypertension: Secondary | ICD-10-CM | POA: Diagnosis not present

## 2019-04-03 DIAGNOSIS — E119 Type 2 diabetes mellitus without complications: Secondary | ICD-10-CM | POA: Diagnosis not present

## 2019-04-03 DIAGNOSIS — Z96642 Presence of left artificial hip joint: Secondary | ICD-10-CM | POA: Diagnosis not present

## 2019-04-03 DIAGNOSIS — Z9181 History of falling: Secondary | ICD-10-CM | POA: Diagnosis not present

## 2019-04-03 DIAGNOSIS — I69351 Hemiplegia and hemiparesis following cerebral infarction affecting right dominant side: Secondary | ICD-10-CM | POA: Diagnosis not present

## 2019-04-03 DIAGNOSIS — E785 Hyperlipidemia, unspecified: Secondary | ICD-10-CM | POA: Diagnosis not present

## 2019-04-04 DIAGNOSIS — I69351 Hemiplegia and hemiparesis following cerebral infarction affecting right dominant side: Secondary | ICD-10-CM | POA: Diagnosis not present

## 2019-04-04 DIAGNOSIS — Z7901 Long term (current) use of anticoagulants: Secondary | ICD-10-CM | POA: Diagnosis not present

## 2019-04-04 DIAGNOSIS — Z5181 Encounter for therapeutic drug level monitoring: Secondary | ICD-10-CM | POA: Diagnosis not present

## 2019-04-04 DIAGNOSIS — Z9181 History of falling: Secondary | ICD-10-CM | POA: Diagnosis not present

## 2019-04-04 DIAGNOSIS — D689 Coagulation defect, unspecified: Secondary | ICD-10-CM | POA: Diagnosis not present

## 2019-04-05 DIAGNOSIS — Z7901 Long term (current) use of anticoagulants: Secondary | ICD-10-CM | POA: Diagnosis not present

## 2019-04-05 DIAGNOSIS — D689 Coagulation defect, unspecified: Secondary | ICD-10-CM | POA: Diagnosis not present

## 2019-04-05 DIAGNOSIS — Z9181 History of falling: Secondary | ICD-10-CM | POA: Diagnosis not present

## 2019-04-05 DIAGNOSIS — Z5181 Encounter for therapeutic drug level monitoring: Secondary | ICD-10-CM | POA: Diagnosis not present

## 2019-04-05 DIAGNOSIS — I69351 Hemiplegia and hemiparesis following cerebral infarction affecting right dominant side: Secondary | ICD-10-CM | POA: Diagnosis not present

## 2019-04-06 DIAGNOSIS — Z5181 Encounter for therapeutic drug level monitoring: Secondary | ICD-10-CM | POA: Diagnosis not present

## 2019-04-06 DIAGNOSIS — Z7901 Long term (current) use of anticoagulants: Secondary | ICD-10-CM | POA: Diagnosis not present

## 2019-04-06 DIAGNOSIS — Z9181 History of falling: Secondary | ICD-10-CM | POA: Diagnosis not present

## 2019-04-06 DIAGNOSIS — I69351 Hemiplegia and hemiparesis following cerebral infarction affecting right dominant side: Secondary | ICD-10-CM | POA: Diagnosis not present

## 2019-04-06 DIAGNOSIS — D689 Coagulation defect, unspecified: Secondary | ICD-10-CM | POA: Diagnosis not present

## 2019-04-07 DIAGNOSIS — Z7901 Long term (current) use of anticoagulants: Secondary | ICD-10-CM | POA: Diagnosis not present

## 2019-04-07 DIAGNOSIS — Z5181 Encounter for therapeutic drug level monitoring: Secondary | ICD-10-CM | POA: Diagnosis not present

## 2019-04-07 DIAGNOSIS — Z9181 History of falling: Secondary | ICD-10-CM | POA: Diagnosis not present

## 2019-04-07 DIAGNOSIS — I69351 Hemiplegia and hemiparesis following cerebral infarction affecting right dominant side: Secondary | ICD-10-CM | POA: Diagnosis not present

## 2019-04-07 DIAGNOSIS — D689 Coagulation defect, unspecified: Secondary | ICD-10-CM | POA: Diagnosis not present

## 2019-04-08 DIAGNOSIS — I69351 Hemiplegia and hemiparesis following cerebral infarction affecting right dominant side: Secondary | ICD-10-CM | POA: Diagnosis not present

## 2019-04-08 DIAGNOSIS — Z5181 Encounter for therapeutic drug level monitoring: Secondary | ICD-10-CM | POA: Diagnosis not present

## 2019-04-08 DIAGNOSIS — N390 Urinary tract infection, site not specified: Secondary | ICD-10-CM | POA: Diagnosis not present

## 2019-04-08 DIAGNOSIS — Z7901 Long term (current) use of anticoagulants: Secondary | ICD-10-CM | POA: Diagnosis not present

## 2019-04-08 DIAGNOSIS — D689 Coagulation defect, unspecified: Secondary | ICD-10-CM | POA: Diagnosis not present

## 2019-04-08 DIAGNOSIS — Z9181 History of falling: Secondary | ICD-10-CM | POA: Diagnosis not present

## 2019-04-12 DIAGNOSIS — M1612 Unilateral primary osteoarthritis, left hip: Secondary | ICD-10-CM | POA: Diagnosis not present

## 2019-04-12 DIAGNOSIS — Z7901 Long term (current) use of anticoagulants: Secondary | ICD-10-CM | POA: Diagnosis not present

## 2019-04-12 DIAGNOSIS — Z9181 History of falling: Secondary | ICD-10-CM | POA: Diagnosis not present

## 2019-04-12 DIAGNOSIS — Z5181 Encounter for therapeutic drug level monitoring: Secondary | ICD-10-CM | POA: Diagnosis not present

## 2019-04-12 DIAGNOSIS — D689 Coagulation defect, unspecified: Secondary | ICD-10-CM | POA: Diagnosis not present

## 2019-04-12 DIAGNOSIS — I69351 Hemiplegia and hemiparesis following cerebral infarction affecting right dominant side: Secondary | ICD-10-CM | POA: Diagnosis not present

## 2019-04-13 DIAGNOSIS — E785 Hyperlipidemia, unspecified: Secondary | ICD-10-CM | POA: Diagnosis not present

## 2019-04-13 DIAGNOSIS — I10 Essential (primary) hypertension: Secondary | ICD-10-CM | POA: Diagnosis not present

## 2019-04-13 DIAGNOSIS — D6862 Lupus anticoagulant syndrome: Secondary | ICD-10-CM | POA: Diagnosis not present

## 2019-04-13 DIAGNOSIS — Z471 Aftercare following joint replacement surgery: Secondary | ICD-10-CM | POA: Diagnosis not present

## 2019-04-13 DIAGNOSIS — Z87440 Personal history of urinary (tract) infections: Secondary | ICD-10-CM | POA: Diagnosis not present

## 2019-04-13 DIAGNOSIS — D689 Coagulation defect, unspecified: Secondary | ICD-10-CM | POA: Diagnosis not present

## 2019-04-13 DIAGNOSIS — E119 Type 2 diabetes mellitus without complications: Secondary | ICD-10-CM | POA: Diagnosis not present

## 2019-04-13 DIAGNOSIS — Z5181 Encounter for therapeutic drug level monitoring: Secondary | ICD-10-CM | POA: Diagnosis not present

## 2019-04-13 DIAGNOSIS — Z7901 Long term (current) use of anticoagulants: Secondary | ICD-10-CM | POA: Diagnosis not present

## 2019-04-13 DIAGNOSIS — Z87891 Personal history of nicotine dependence: Secondary | ICD-10-CM | POA: Diagnosis not present

## 2019-04-13 DIAGNOSIS — Z96642 Presence of left artificial hip joint: Secondary | ICD-10-CM | POA: Diagnosis not present

## 2019-04-13 DIAGNOSIS — E669 Obesity, unspecified: Secondary | ICD-10-CM | POA: Diagnosis not present

## 2019-04-13 DIAGNOSIS — I69351 Hemiplegia and hemiparesis following cerebral infarction affecting right dominant side: Secondary | ICD-10-CM | POA: Diagnosis not present

## 2019-04-13 DIAGNOSIS — F329 Major depressive disorder, single episode, unspecified: Secondary | ICD-10-CM | POA: Diagnosis not present

## 2019-04-13 DIAGNOSIS — M8580 Other specified disorders of bone density and structure, unspecified site: Secondary | ICD-10-CM | POA: Diagnosis not present

## 2019-04-15 DIAGNOSIS — M8580 Other specified disorders of bone density and structure, unspecified site: Secondary | ICD-10-CM | POA: Diagnosis not present

## 2019-04-15 DIAGNOSIS — I10 Essential (primary) hypertension: Secondary | ICD-10-CM | POA: Diagnosis not present

## 2019-04-15 DIAGNOSIS — E785 Hyperlipidemia, unspecified: Secondary | ICD-10-CM | POA: Diagnosis not present

## 2019-04-15 DIAGNOSIS — I69351 Hemiplegia and hemiparesis following cerebral infarction affecting right dominant side: Secondary | ICD-10-CM | POA: Diagnosis not present

## 2019-04-15 DIAGNOSIS — Z471 Aftercare following joint replacement surgery: Secondary | ICD-10-CM | POA: Diagnosis not present

## 2019-04-15 DIAGNOSIS — E119 Type 2 diabetes mellitus without complications: Secondary | ICD-10-CM | POA: Diagnosis not present

## 2019-04-20 DIAGNOSIS — E119 Type 2 diabetes mellitus without complications: Secondary | ICD-10-CM | POA: Diagnosis not present

## 2019-04-20 DIAGNOSIS — E785 Hyperlipidemia, unspecified: Secondary | ICD-10-CM | POA: Diagnosis not present

## 2019-04-20 DIAGNOSIS — I69351 Hemiplegia and hemiparesis following cerebral infarction affecting right dominant side: Secondary | ICD-10-CM | POA: Diagnosis not present

## 2019-04-20 DIAGNOSIS — M8580 Other specified disorders of bone density and structure, unspecified site: Secondary | ICD-10-CM | POA: Diagnosis not present

## 2019-04-20 DIAGNOSIS — I10 Essential (primary) hypertension: Secondary | ICD-10-CM | POA: Diagnosis not present

## 2019-04-20 DIAGNOSIS — Z471 Aftercare following joint replacement surgery: Secondary | ICD-10-CM | POA: Diagnosis not present

## 2019-04-22 DIAGNOSIS — I10 Essential (primary) hypertension: Secondary | ICD-10-CM | POA: Diagnosis not present

## 2019-04-22 DIAGNOSIS — Z471 Aftercare following joint replacement surgery: Secondary | ICD-10-CM | POA: Diagnosis not present

## 2019-04-22 DIAGNOSIS — I69351 Hemiplegia and hemiparesis following cerebral infarction affecting right dominant side: Secondary | ICD-10-CM | POA: Diagnosis not present

## 2019-04-22 DIAGNOSIS — M8580 Other specified disorders of bone density and structure, unspecified site: Secondary | ICD-10-CM | POA: Diagnosis not present

## 2019-04-22 DIAGNOSIS — E785 Hyperlipidemia, unspecified: Secondary | ICD-10-CM | POA: Diagnosis not present

## 2019-04-22 DIAGNOSIS — E119 Type 2 diabetes mellitus without complications: Secondary | ICD-10-CM | POA: Diagnosis not present

## 2019-04-26 DIAGNOSIS — I69351 Hemiplegia and hemiparesis following cerebral infarction affecting right dominant side: Secondary | ICD-10-CM | POA: Diagnosis not present

## 2019-04-26 DIAGNOSIS — M8580 Other specified disorders of bone density and structure, unspecified site: Secondary | ICD-10-CM | POA: Diagnosis not present

## 2019-04-26 DIAGNOSIS — I10 Essential (primary) hypertension: Secondary | ICD-10-CM | POA: Diagnosis not present

## 2019-04-26 DIAGNOSIS — Z471 Aftercare following joint replacement surgery: Secondary | ICD-10-CM | POA: Diagnosis not present

## 2019-04-26 DIAGNOSIS — E119 Type 2 diabetes mellitus without complications: Secondary | ICD-10-CM | POA: Diagnosis not present

## 2019-04-26 DIAGNOSIS — M1612 Unilateral primary osteoarthritis, left hip: Secondary | ICD-10-CM | POA: Diagnosis not present

## 2019-04-26 DIAGNOSIS — E785 Hyperlipidemia, unspecified: Secondary | ICD-10-CM | POA: Diagnosis not present

## 2019-04-28 DIAGNOSIS — Z7901 Long term (current) use of anticoagulants: Secondary | ICD-10-CM | POA: Diagnosis not present

## 2019-04-28 DIAGNOSIS — I69959 Hemiplegia and hemiparesis following unspecified cerebrovascular disease affecting unspecified side: Secondary | ICD-10-CM | POA: Diagnosis not present

## 2019-04-28 DIAGNOSIS — M545 Low back pain: Secondary | ICD-10-CM | POA: Diagnosis not present

## 2019-04-28 DIAGNOSIS — F329 Major depressive disorder, single episode, unspecified: Secondary | ICD-10-CM | POA: Diagnosis not present

## 2019-04-28 DIAGNOSIS — E785 Hyperlipidemia, unspecified: Secondary | ICD-10-CM | POA: Diagnosis not present

## 2019-04-28 DIAGNOSIS — R2689 Other abnormalities of gait and mobility: Secondary | ICD-10-CM | POA: Diagnosis not present

## 2019-04-28 DIAGNOSIS — R7301 Impaired fasting glucose: Secondary | ICD-10-CM | POA: Diagnosis not present

## 2019-04-28 DIAGNOSIS — D689 Coagulation defect, unspecified: Secondary | ICD-10-CM | POA: Diagnosis not present

## 2019-04-28 DIAGNOSIS — R413 Other amnesia: Secondary | ICD-10-CM | POA: Diagnosis not present

## 2019-04-28 DIAGNOSIS — I10 Essential (primary) hypertension: Secondary | ICD-10-CM | POA: Diagnosis not present

## 2019-05-04 DIAGNOSIS — R262 Difficulty in walking, not elsewhere classified: Secondary | ICD-10-CM | POA: Diagnosis not present

## 2019-05-04 DIAGNOSIS — M25552 Pain in left hip: Secondary | ICD-10-CM | POA: Diagnosis not present

## 2019-05-04 DIAGNOSIS — M6281 Muscle weakness (generalized): Secondary | ICD-10-CM | POA: Diagnosis not present

## 2019-05-05 DIAGNOSIS — Z7901 Long term (current) use of anticoagulants: Secondary | ICD-10-CM | POA: Diagnosis not present

## 2019-05-06 DIAGNOSIS — M25552 Pain in left hip: Secondary | ICD-10-CM | POA: Diagnosis not present

## 2019-05-06 DIAGNOSIS — M6281 Muscle weakness (generalized): Secondary | ICD-10-CM | POA: Diagnosis not present

## 2019-05-06 DIAGNOSIS — R262 Difficulty in walking, not elsewhere classified: Secondary | ICD-10-CM | POA: Diagnosis not present

## 2019-05-11 DIAGNOSIS — M6281 Muscle weakness (generalized): Secondary | ICD-10-CM | POA: Diagnosis not present

## 2019-05-11 DIAGNOSIS — R262 Difficulty in walking, not elsewhere classified: Secondary | ICD-10-CM | POA: Diagnosis not present

## 2019-05-11 DIAGNOSIS — M25552 Pain in left hip: Secondary | ICD-10-CM | POA: Diagnosis not present

## 2019-05-12 DIAGNOSIS — Z7901 Long term (current) use of anticoagulants: Secondary | ICD-10-CM | POA: Diagnosis not present

## 2019-05-13 DIAGNOSIS — R262 Difficulty in walking, not elsewhere classified: Secondary | ICD-10-CM | POA: Diagnosis not present

## 2019-05-13 DIAGNOSIS — M6281 Muscle weakness (generalized): Secondary | ICD-10-CM | POA: Diagnosis not present

## 2019-05-13 DIAGNOSIS — M25552 Pain in left hip: Secondary | ICD-10-CM | POA: Diagnosis not present

## 2019-05-14 DIAGNOSIS — M859 Disorder of bone density and structure, unspecified: Secondary | ICD-10-CM | POA: Diagnosis not present

## 2019-05-14 DIAGNOSIS — E7849 Other hyperlipidemia: Secondary | ICD-10-CM | POA: Diagnosis not present

## 2019-05-14 DIAGNOSIS — Z7901 Long term (current) use of anticoagulants: Secondary | ICD-10-CM | POA: Diagnosis not present

## 2019-05-19 ENCOUNTER — Ambulatory Visit: Payer: Self-pay | Admitting: Neurology

## 2019-05-21 DIAGNOSIS — F329 Major depressive disorder, single episode, unspecified: Secondary | ICD-10-CM | POA: Diagnosis not present

## 2019-05-21 DIAGNOSIS — Z0000 Encounter for general adult medical examination without abnormal findings: Secondary | ICD-10-CM | POA: Diagnosis not present

## 2019-05-21 DIAGNOSIS — R2689 Other abnormalities of gait and mobility: Secondary | ICD-10-CM | POA: Diagnosis not present

## 2019-05-21 DIAGNOSIS — E669 Obesity, unspecified: Secondary | ICD-10-CM | POA: Diagnosis not present

## 2019-05-21 DIAGNOSIS — E785 Hyperlipidemia, unspecified: Secondary | ICD-10-CM | POA: Diagnosis not present

## 2019-05-21 DIAGNOSIS — I10 Essential (primary) hypertension: Secondary | ICD-10-CM | POA: Diagnosis not present

## 2019-05-21 DIAGNOSIS — I69959 Hemiplegia and hemiparesis following unspecified cerebrovascular disease affecting unspecified side: Secondary | ICD-10-CM | POA: Diagnosis not present

## 2019-05-21 DIAGNOSIS — R7301 Impaired fasting glucose: Secondary | ICD-10-CM | POA: Diagnosis not present

## 2019-05-21 DIAGNOSIS — D689 Coagulation defect, unspecified: Secondary | ICD-10-CM | POA: Diagnosis not present

## 2019-05-21 DIAGNOSIS — Z1331 Encounter for screening for depression: Secondary | ICD-10-CM | POA: Diagnosis not present

## 2019-05-21 DIAGNOSIS — R413 Other amnesia: Secondary | ICD-10-CM | POA: Diagnosis not present

## 2019-05-21 DIAGNOSIS — Z7901 Long term (current) use of anticoagulants: Secondary | ICD-10-CM | POA: Diagnosis not present

## 2019-06-02 DIAGNOSIS — Z96642 Presence of left artificial hip joint: Secondary | ICD-10-CM | POA: Diagnosis not present

## 2019-06-11 DIAGNOSIS — Z23 Encounter for immunization: Secondary | ICD-10-CM | POA: Diagnosis not present

## 2019-06-11 DIAGNOSIS — Z7901 Long term (current) use of anticoagulants: Secondary | ICD-10-CM | POA: Diagnosis not present

## 2019-06-11 DIAGNOSIS — R82998 Other abnormal findings in urine: Secondary | ICD-10-CM | POA: Diagnosis not present

## 2019-06-25 DIAGNOSIS — Z7901 Long term (current) use of anticoagulants: Secondary | ICD-10-CM | POA: Diagnosis not present

## 2019-07-16 DIAGNOSIS — Z7901 Long term (current) use of anticoagulants: Secondary | ICD-10-CM | POA: Diagnosis not present

## 2019-08-11 DIAGNOSIS — D689 Coagulation defect, unspecified: Secondary | ICD-10-CM | POA: Diagnosis not present

## 2019-08-11 DIAGNOSIS — Z7901 Long term (current) use of anticoagulants: Secondary | ICD-10-CM | POA: Diagnosis not present

## 2019-08-30 DIAGNOSIS — H2513 Age-related nuclear cataract, bilateral: Secondary | ICD-10-CM | POA: Diagnosis not present

## 2019-09-01 DIAGNOSIS — R531 Weakness: Secondary | ICD-10-CM | POA: Diagnosis not present

## 2019-09-01 DIAGNOSIS — Z6827 Body mass index (BMI) 27.0-27.9, adult: Secondary | ICD-10-CM | POA: Diagnosis not present

## 2019-09-01 DIAGNOSIS — I69998 Other sequelae following unspecified cerebrovascular disease: Secondary | ICD-10-CM | POA: Diagnosis not present

## 2019-09-01 DIAGNOSIS — M21371 Foot drop, right foot: Secondary | ICD-10-CM | POA: Diagnosis not present

## 2019-09-01 DIAGNOSIS — I619 Nontraumatic intracerebral hemorrhage, unspecified: Secondary | ICD-10-CM | POA: Diagnosis not present

## 2019-09-03 DIAGNOSIS — Z9289 Personal history of other medical treatment: Secondary | ICD-10-CM | POA: Diagnosis not present

## 2019-09-03 DIAGNOSIS — Z1231 Encounter for screening mammogram for malignant neoplasm of breast: Secondary | ICD-10-CM | POA: Diagnosis not present

## 2019-09-14 DIAGNOSIS — Z7901 Long term (current) use of anticoagulants: Secondary | ICD-10-CM | POA: Diagnosis not present

## 2019-09-24 ENCOUNTER — Ambulatory Visit (INDEPENDENT_AMBULATORY_CARE_PROVIDER_SITE_OTHER): Payer: Medicare Other | Admitting: Obstetrics & Gynecology

## 2019-09-24 ENCOUNTER — Encounter: Payer: Self-pay | Admitting: Obstetrics & Gynecology

## 2019-09-24 ENCOUNTER — Other Ambulatory Visit: Payer: Self-pay

## 2019-09-24 VITALS — BP 150/80 | Temp 97.0°F | Ht 64.0 in | Wt 176.0 lb

## 2019-09-24 DIAGNOSIS — R300 Dysuria: Secondary | ICD-10-CM | POA: Diagnosis not present

## 2019-09-24 DIAGNOSIS — Z01419 Encounter for gynecological examination (general) (routine) without abnormal findings: Secondary | ICD-10-CM

## 2019-09-24 DIAGNOSIS — N3001 Acute cystitis with hematuria: Secondary | ICD-10-CM

## 2019-09-24 LAB — POCT URINALYSIS DIPSTICK
Blood, UA: POSITIVE
Nitrite, UA: POSITIVE

## 2019-09-24 MED ORDER — NITROFURANTOIN MONOHYD MACRO 100 MG PO CAPS: 100.0000 mg | ORAL_CAPSULE | Freq: Two times a day (BID) | ORAL | 0 refills | Status: DC

## 2019-09-24 NOTE — Addendum Note (Signed)
Addended by: Quintella Baton D on: 09/24/2019 10:43 AM   Modules accepted: Orders

## 2019-09-24 NOTE — Progress Notes (Signed)
HPI:      [name] is a 73 y.o. [id number] who LMP was in the past, she presents today for her annual examination.  The patient has no complaints today other than some mild dysuria. The patient is not sexually active. Herlast pap: was normal and last mammogram: approximate date 2020 (Nov) and was normal.  The patient does perform self breast exams.  There is no notable family history of breast or ovarian cancer in her family. The patient is not taking hormone replacement therapy. Patient denies post-menopausal vaginal bleeding.   The patient has regular exercise: yes. The patient denies current symptoms of depression.    GYN Hx: Last Colonoscopy:4 years ago. Normal.  Last DEXA: never ago.    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   Diabetes mellitus without complication ([place])    no meds-diet controlled   Factor 5 Leiden mutation, heterozygous ([place])    Heart murmur    mild mitral regurg-echo 2010   Hyperlipidemia    Hypertension    Lupus anticoagulant positive    Obesity    Primary localized osteoarthritis of left hip 03/30/2019   Stroke ([place]) 2010   Past Surgical History:  Procedure Laterality Date   ABDOMINAL HYSTERECTOMY  1981   APPENDECTOMY     AXILLARY LYMPH NODE BIOPSY Left 05/16/2014   Procedure: EXCISION 2CM LEFT AXILLARY MASS;  Surgeon: [name], MD;  Location: [place];  Service: General;  Laterality: Left;  Left axillary sebaceous cyst excision   DILATION AND CURETTAGE OF UTERUS     SKIN CANCER EXCISION  2016   anl[place]   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   Procedure: TOTAL HIP ARTHROPLASTY;  Surgeon: [name], MD;  Location: [facility];  Service: Orthopedics;  Laterality: Left;   Family History  Problem Relation Age of Onset   Heart disease Mother    COPD Father    Colon cancer Sister    Clotting disorder Sister    Diabetes Sister    Kidney  disease Sister    Heart disease Brother    Social History   Tobacco Use   Smoking status: Former Smoker    Types: Cigarettes    Quit date: 03/14/2009    Years since quitting: 10.5   Smokeless tobacco: Never Used  Substance Use Topics   Alcohol use: No    Alcohol/week: 0.0 standard drinks   Drug use: No    Current Outpatient Medications:    acetaminophen (TYLENOL) 650 MG CR tablet, Take 1,300 mg by mouth every 8 (eight) hours., Disp: , Rfl:    amLODipine (NORVASC) 5 MG tablet, Take 5 mg by mouth 2 (two) times daily. , Disp: , Rfl:    aspirin 81 MG tablet, Take 81 mg by mouth daily., Disp: , Rfl:    B Complex Vitamins (B COMPLEX PO), Take 1 tablet by mouth daily. , Disp: , Rfl:    baclofen (LIORESAL) 10 MG tablet, Take 1 tablet (10 mg total) by mouth 3 (three) times daily. As needed for muscle spasm, Disp: 50 tablet, Rfl: 0   BIOTIN PO, Take 1 tablet by mouth daily. , Disp: , Rfl:    CALCIUM PO, Take 2 tablets by mouth daily. , Disp: , Rfl:    cholecalciferol (VITAMIN D) 1000 UNITS tablet, Take 1,000 Units by mouth daily. , Disp: , Rfl:    diclofenac sodium (VOLTAREN) 1 %  GEL, Apply 1 application topically 3 (three) times daily as needed (pain)., Disp: , Rfl:    enoxaparin (LOVENOX) 80 MG/0.8ML injection, Inject 80 mg into the skin., Disp: , Rfl:    escitalopram (LEXAPRO) 10 MG tablet, Take 10 mg by mouth daily., Disp: , Rfl: 11   ezetimibe (ZETIA) 10 MG tablet, Take 5 mg by mouth 3 (three) times a week. , Disp: , Rfl:    fluticasone (FLONASE) 50 MCG/ACT nasal spray, Place 2 sprays into both nostrils daily as needed for allergies. , Disp: , Rfl: 1   lisinopril (PRINIVIL,ZESTRIL) 10 MG tablet, Take 10 mg by mouth 2 (two) times daily. , Disp: , Rfl: 2   Magnesium 250 MG TABS, Take 250 mg by mouth daily. , Disp: , Rfl:    ondansetron (ZOFRAN) 4 MG tablet, Take 1 tablet (4 mg total) by mouth every 8 (eight) hours as needed for nausea or vomiting., Disp: 10 tablet, Rfl:  0   sennosides-docusate sodium (SENOKOT-S) 8.6-50 MG tablet, Take 2 tablets by mouth daily., Disp: 30 tablet, Rfl: 1   simvastatin (ZOCOR) 20 MG tablet, Take 20 mg by mouth at bedtime. , Disp: , Rfl:    traMADol (ULTRAM) 50 MG tablet, Take 1 tablet (50 mg total) by mouth every 6 (six) hours as needed., Disp: 20 tablet, Rfl: 0   vitamin B-12 (CYANOCOBALAMIN) 1000 MCG tablet, Take 1,000 mcg by mouth daily., Disp: , Rfl:    warfarin (COUMADIN) 5 MG tablet, Take 5 mg by mouth daily. Take 5 mg by mouth daily, Disp: , Rfl:  Allergies: Codeine  Review of Systems  Constitutional: Negative for chills, fever and malaise/fatigue.  HENT: Negative for congestion, sinus pain and sore throat.   Eyes: Negative for blurred vision and pain.  Respiratory: Negative for cough and wheezing.   Cardiovascular: Negative for chest pain and leg swelling.  Gastrointestinal: Negative for abdominal pain, constipation, diarrhea, heartburn, nausea and vomiting.  Genitourinary: Negative for dysuria, frequency, hematuria and urgency.  Musculoskeletal: Negative for back pain, joint pain, myalgias and neck pain.  Skin: Negative for itching and rash.  Neurological: Negative for dizziness, tremors and weakness.  Endo/Heme/Allergies: Does not bruise/bleed easily.  Psychiatric/Behavioral: Negative for depression. The patient is not nervous/anxious and does not have insomnia.     Objective: BP (!) 150/80    Temp (!) 97 F (36.1 C)    Ht 5\' 4"  (1.626 m)    Wt 176 lb (79.8 kg)    BMI 30.21 kg/m   Filed Weights   09/24/19 0958  Weight: 176 lb (79.8 kg)   Body mass index is 30.21 kg/m. Physical Exam Constitutional:      General: She is not in acute distress.    Appearance: She is well-developed.  Genitourinary:     Pelvic exam was performed with patient supine.     Vagina and rectum normal.     No lesions in the vagina.     No vaginal bleeding.     No right or left adnexal mass present.     Right adnexa not  tender.     Left adnexa not tender.     Genitourinary Comments: Absent Uterus Absent cervix Vaginal cuff well healed  HENT:     Head: Normocephalic and atraumatic. No laceration.     Right Ear: Hearing normal.     Left Ear: Hearing normal.     Mouth/Throat:     Pharynx: Uvula midline.  Eyes:     Pupils: Pupils are  equal, round, and reactive to light.  Neck:     Thyroid: No thyromegaly.  Cardiovascular:     Rate and Rhythm: Normal rate and regular rhythm.     Heart sounds: No murmur. No friction rub. No gallop.   Pulmonary:     Effort: Pulmonary effort is normal. No respiratory distress.     Breath sounds: Normal breath sounds. No wheezing.  Abdominal:     General: Bowel sounds are normal. There is no distension.     Palpations: Abdomen is soft.     Tenderness: There is no abdominal tenderness. There is no rebound.  Musculoskeletal:        General: Normal range of motion.     Cervical back: Normal range of motion and neck supple.  Neurological:     Mental Status: She is alert and oriented to person, place, and time.     Cranial Nerves: No cranial nerve deficit.  Skin:    General: Skin is warm and dry.  Psychiatric:        Judgment: Judgment normal.  Vitals reviewed.    Assessment: Annual Exam 1. Women's annual routine gynecological examination   2. Dysuria   3. Acute cystitis with hematuria    UA- POS Nitrates    POS Blood  Plan:            1.  Vaginal Screening-  Pap smear schedule reviewed with patient (prior [facility])  2. Breast screening- Exam annually and mammogram scheduled ([facility])  3. Colonoscopy every 10 years  4. Labs managed by PCP  5. Counseling for hormonal therapy: none  6. UTI, tx w Macrobid     F/U  Return in about 1 year (around 09/23/2020) for Annual.  Barnett Applebaum, MD, [facility], [facility] 09/24/2019  10:38 AM

## 2019-09-24 NOTE — Addendum Note (Signed)
Addended by: Gae Dry on: 09/24/2019 04:36 PM   Modules accepted: Orders

## 2019-09-24 NOTE — Patient Instructions (Signed)
Nitrofurantoin tablets or capsules What is this medicine? NITROFURANTOIN (nye troe fyoor AN toyn) is an antibiotic. It is used to treat urinary tract infections. This medicine may be used for other purposes; ask your health care provider or pharmacist if you have questions. COMMON BRAND NAME(S): Macrobid, Macrodantin, Urotoin What should I tell my health care provider before I take this medicine? They need to know if you have any of these conditions:  anemia  diabetes  glucose-6-phosphate dehydrogenase deficiency  kidney disease  liver disease  lung disease  other chronic illness  an unusual or allergic reaction to nitrofurantoin, other antibiotics, other medicines, foods, dyes or preservatives  pregnant or trying to get pregnant  breast-feeding How should I use this medicine? Take this medicine by mouth with a glass of water. Follow the directions on the prescription label. Take this medicine with food or milk. Take your doses at regular intervals. Do not take your medicine more often than directed. Do not stop taking except on your doctor's advice. Talk to your pediatrician regarding the use of this medicine in children. While this drug may be prescribed for selected conditions, precautions do apply. Overdosage: If you think you have taken too much of this medicine contact a poison control center or emergency room at once. NOTE: This medicine is only for you. Do not share this medicine with others. What if I miss a dose? If you miss a dose, take it as soon as you can. If it is almost time for your next dose, take only that dose. Do not take double or extra doses. What may interact with this medicine?  antacids containing magnesium trisilicate  probenecid  quinolone antibiotics like ciprofloxacin, lomefloxacin, norfloxacin and ofloxacin  sulfinpyrazone This list may not describe all possible interactions. Give your health care provider a list of all the medicines, herbs,  non-prescription drugs, or dietary supplements you use. Also tell them if you smoke, drink alcohol, or use illegal drugs. Some items may interact with your medicine. What should I watch for while using this medicine? Tell your doctor or health care professional if your symptoms do not improve or if you get new symptoms. Drink several glasses of water a day. If you are taking this medicine for a long time, visit your doctor for regular checks on your progress. If you are diabetic, you may get a false positive result for sugar in your urine with certain brands of urine tests. Check with your doctor. What side effects may I notice from receiving this medicine? Side effects that you should report to your doctor or health care professional as soon as possible:  allergic reactions like skin rash or hives, swelling of the face, lips, or tongue  chest pain  cough  difficulty breathing  dizziness, drowsiness  fever or infection  joint aches or pains  pale or blue-tinted skin  redness, blistering, peeling or loosening of the skin, including inside the mouth  tingling, burning, pain, or numbness in hands or feet  unusual bleeding or bruising  unusually weak or tired  yellowing of eyes or skin Side effects that usually do not require medical attention (report to your doctor or health care professional if they continue or are bothersome):  dark urine  diarrhea  headache  loss of appetite  nausea or vomiting  temporary hair loss This list may not describe all possible side effects. Call your doctor for medical advice about side effects. You may report side effects to FDA at 1-800-FDA-1088. Where should  I keep my medicine? Keep out of the reach of children. Store at room temperature between 15 and 30 degrees C (59 and 86 degrees F). Protect from light. Throw away any unused medicine after the expiration date. NOTE: This sheet is a summary. It may not cover all possible information.  If you have questions about this medicine, talk to your doctor, pharmacist, or health care provider.  2020 Elsevier/Gold Standard (2008-04-20 15:56:47)

## 2019-09-29 LAB — URINE CULTURE

## 2019-10-01 DIAGNOSIS — Z7409 Other reduced mobility: Secondary | ICD-10-CM | POA: Diagnosis not present

## 2019-10-01 DIAGNOSIS — M21371 Foot drop, right foot: Secondary | ICD-10-CM | POA: Diagnosis not present

## 2019-10-07 DIAGNOSIS — Z7409 Other reduced mobility: Secondary | ICD-10-CM | POA: Diagnosis not present

## 2019-10-07 DIAGNOSIS — M21371 Foot drop, right foot: Secondary | ICD-10-CM | POA: Diagnosis not present

## 2019-10-21 DIAGNOSIS — M21371 Foot drop, right foot: Secondary | ICD-10-CM | POA: Diagnosis not present

## 2019-10-21 DIAGNOSIS — Z7409 Other reduced mobility: Secondary | ICD-10-CM | POA: Diagnosis not present

## 2019-10-28 DIAGNOSIS — M21371 Foot drop, right foot: Secondary | ICD-10-CM | POA: Diagnosis not present

## 2019-10-28 DIAGNOSIS — Z7409 Other reduced mobility: Secondary | ICD-10-CM | POA: Diagnosis not present

## 2019-11-09 DIAGNOSIS — Z7901 Long term (current) use of anticoagulants: Secondary | ICD-10-CM | POA: Diagnosis not present

## 2019-11-11 DIAGNOSIS — M21371 Foot drop, right foot: Secondary | ICD-10-CM | POA: Diagnosis not present

## 2019-11-11 DIAGNOSIS — Z7409 Other reduced mobility: Secondary | ICD-10-CM | POA: Diagnosis not present

## 2019-11-23 DIAGNOSIS — M21371 Foot drop, right foot: Secondary | ICD-10-CM | POA: Diagnosis not present

## 2019-11-23 DIAGNOSIS — Z7409 Other reduced mobility: Secondary | ICD-10-CM | POA: Diagnosis not present

## 2019-11-25 ENCOUNTER — Ambulatory Visit: Payer: Medicare Other | Attending: Internal Medicine

## 2019-11-25 DIAGNOSIS — D689 Coagulation defect, unspecified: Secondary | ICD-10-CM | POA: Diagnosis not present

## 2019-11-25 DIAGNOSIS — Z23 Encounter for immunization: Secondary | ICD-10-CM | POA: Insufficient documentation

## 2019-11-25 DIAGNOSIS — Z1331 Encounter for screening for depression: Secondary | ICD-10-CM | POA: Diagnosis not present

## 2019-11-25 DIAGNOSIS — F329 Major depressive disorder, single episode, unspecified: Secondary | ICD-10-CM | POA: Diagnosis not present

## 2019-11-25 DIAGNOSIS — E785 Hyperlipidemia, unspecified: Secondary | ICD-10-CM | POA: Diagnosis not present

## 2019-11-25 DIAGNOSIS — R413 Other amnesia: Secondary | ICD-10-CM | POA: Diagnosis not present

## 2019-11-25 DIAGNOSIS — I10 Essential (primary) hypertension: Secondary | ICD-10-CM | POA: Diagnosis not present

## 2019-11-25 DIAGNOSIS — R2689 Other abnormalities of gait and mobility: Secondary | ICD-10-CM | POA: Diagnosis not present

## 2019-11-25 DIAGNOSIS — I69959 Hemiplegia and hemiparesis following unspecified cerebrovascular disease affecting unspecified side: Secondary | ICD-10-CM | POA: Diagnosis not present

## 2019-11-25 DIAGNOSIS — R7301 Impaired fasting glucose: Secondary | ICD-10-CM | POA: Diagnosis not present

## 2019-11-25 DIAGNOSIS — Z7901 Long term (current) use of anticoagulants: Secondary | ICD-10-CM | POA: Diagnosis not present

## 2019-11-25 DIAGNOSIS — E669 Obesity, unspecified: Secondary | ICD-10-CM | POA: Diagnosis not present

## 2019-11-25 NOTE — Progress Notes (Signed)
   Covid-19 Vaccination Clinic  Name:  [name]    MRN: [id number] DOB: 1946/06/25  11/25/2019  [name] was observed post Covid-19 immunization for 15 minutes without incidence. She was provided with Vaccine Information Sheet and instruction to access the V-Safe system.   [name] was instructed to call 911 with any severe reactions post vaccine: Marland Kitchen Difficulty breathing  . Swelling of your face and throat  . A fast heartbeat  . A bad rash all over your body  . Dizziness and weakness    Immunizations Administered    Name Date Dose VIS Date Route   Pfizer COVID-19 Vaccine 11/25/2019  8:36 AM 0.3 mL 09/24/2019 Intramuscular   Manufacturer: [place]   Lot: [id number]   [place]: [id number]

## 2019-11-30 DIAGNOSIS — Z7409 Other reduced mobility: Secondary | ICD-10-CM | POA: Diagnosis not present

## 2019-11-30 DIAGNOSIS — M21371 Foot drop, right foot: Secondary | ICD-10-CM | POA: Diagnosis not present

## 2019-12-07 DIAGNOSIS — M21371 Foot drop, right foot: Secondary | ICD-10-CM | POA: Diagnosis not present

## 2019-12-07 DIAGNOSIS — Z7409 Other reduced mobility: Secondary | ICD-10-CM | POA: Diagnosis not present

## 2019-12-09 DIAGNOSIS — Z7901 Long term (current) use of anticoagulants: Secondary | ICD-10-CM | POA: Diagnosis not present

## 2019-12-21 ENCOUNTER — Ambulatory Visit: Payer: Medicare Other | Attending: Internal Medicine

## 2019-12-21 DIAGNOSIS — Z23 Encounter for immunization: Secondary | ICD-10-CM | POA: Insufficient documentation

## 2019-12-21 NOTE — Progress Notes (Signed)
   Covid-19 Vaccination Clinic  Name:  [name]    MRN: [id number] DOB: 1945/11/07  12/21/2019  [name] was observed post Covid-19 immunization for 15 minutes without incident. She was provided with Vaccine Information Sheet and instruction to access the V-Safe system.   [name] was instructed to call 911 with any severe reactions post vaccine: Marland Kitchen Difficulty breathing  . Swelling of face and throat  . A fast heartbeat  . A bad rash all over body  . Dizziness and weakness   Immunizations Administered    Name Date Dose VIS Date Route   Pfizer COVID-19 Vaccine 12/21/2019 10:13 AM 0.3 mL 09/24/2019 Intramuscular   Manufacturer: [place]   Lot: [id number]   [place]: [id number]

## 2020-01-13 DIAGNOSIS — Z7409 Other reduced mobility: Secondary | ICD-10-CM | POA: Diagnosis not present

## 2020-01-13 DIAGNOSIS — M21371 Foot drop, right foot: Secondary | ICD-10-CM | POA: Diagnosis not present

## 2020-01-24 DIAGNOSIS — Z7409 Other reduced mobility: Secondary | ICD-10-CM | POA: Diagnosis not present

## 2020-01-24 DIAGNOSIS — M21371 Foot drop, right foot: Secondary | ICD-10-CM | POA: Diagnosis not present

## 2020-01-24 IMAGING — DX PORTABLE PELVIS 1-2 VIEWS
1 series · 1 of 1 positions shown · non-contrast
Comparison: None.

CLINICAL DATA: Status post left hip arthroplasty.

EXAM:
PORTABLE PELVIS 1-2 VIEWS

[pelvis ap]
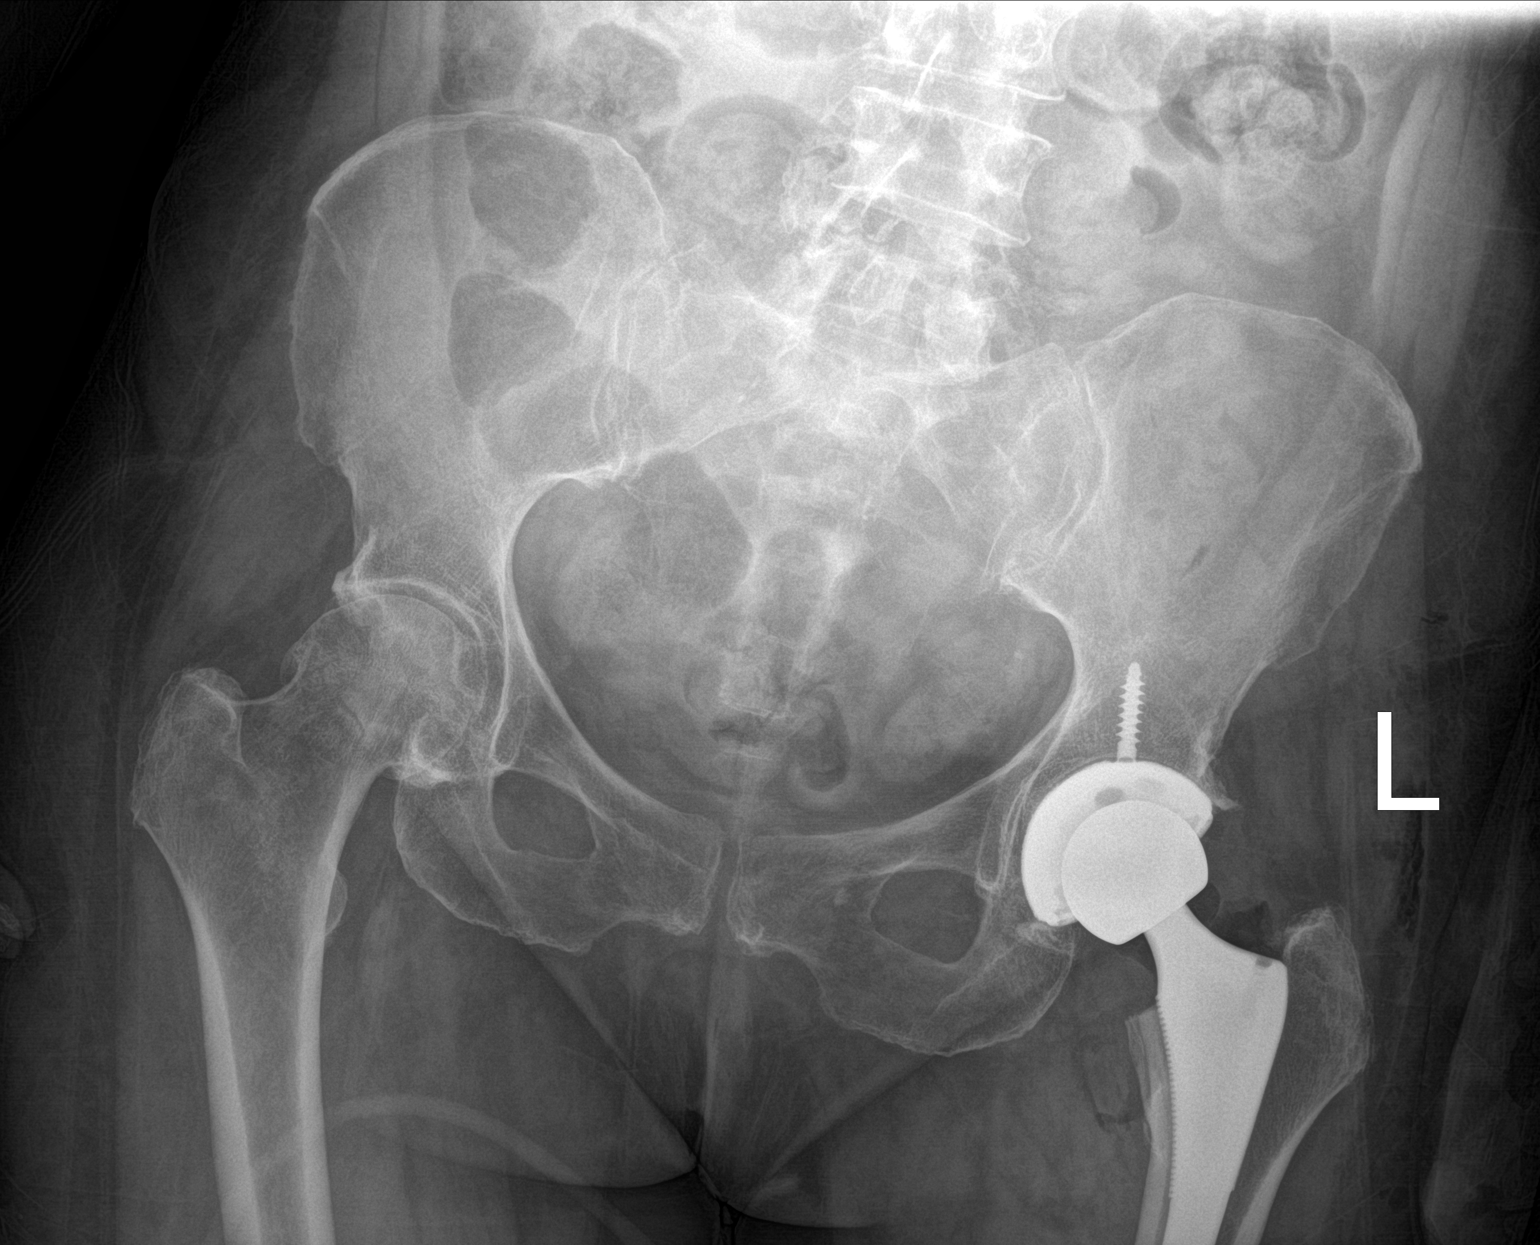

[1 of 1 positions shown; findings below may reference images not displayed]

FINDINGS: Left hip arthroplasty in expected alignment. No periprosthetic
lucency or fracture. Recent postsurgical change includes air and
edema in the soft tissues and joint. Moderate degenerative change of
the right hip.
IMPRESSION: Left hip arthroplasty without immediate postoperative complication.

## 2020-01-24 IMAGING — DX DG HIP (WITH OR WITHOUT PELVIS) 1V PORT LEFT
1 series · 1 of 1 positions shown · non-contrast
Comparison: None.

CLINICAL DATA: Hip replacement

EXAM:
DG HIP (WITH OR WITHOUT PELVIS) 1V PORT LEFT

[hip ap]
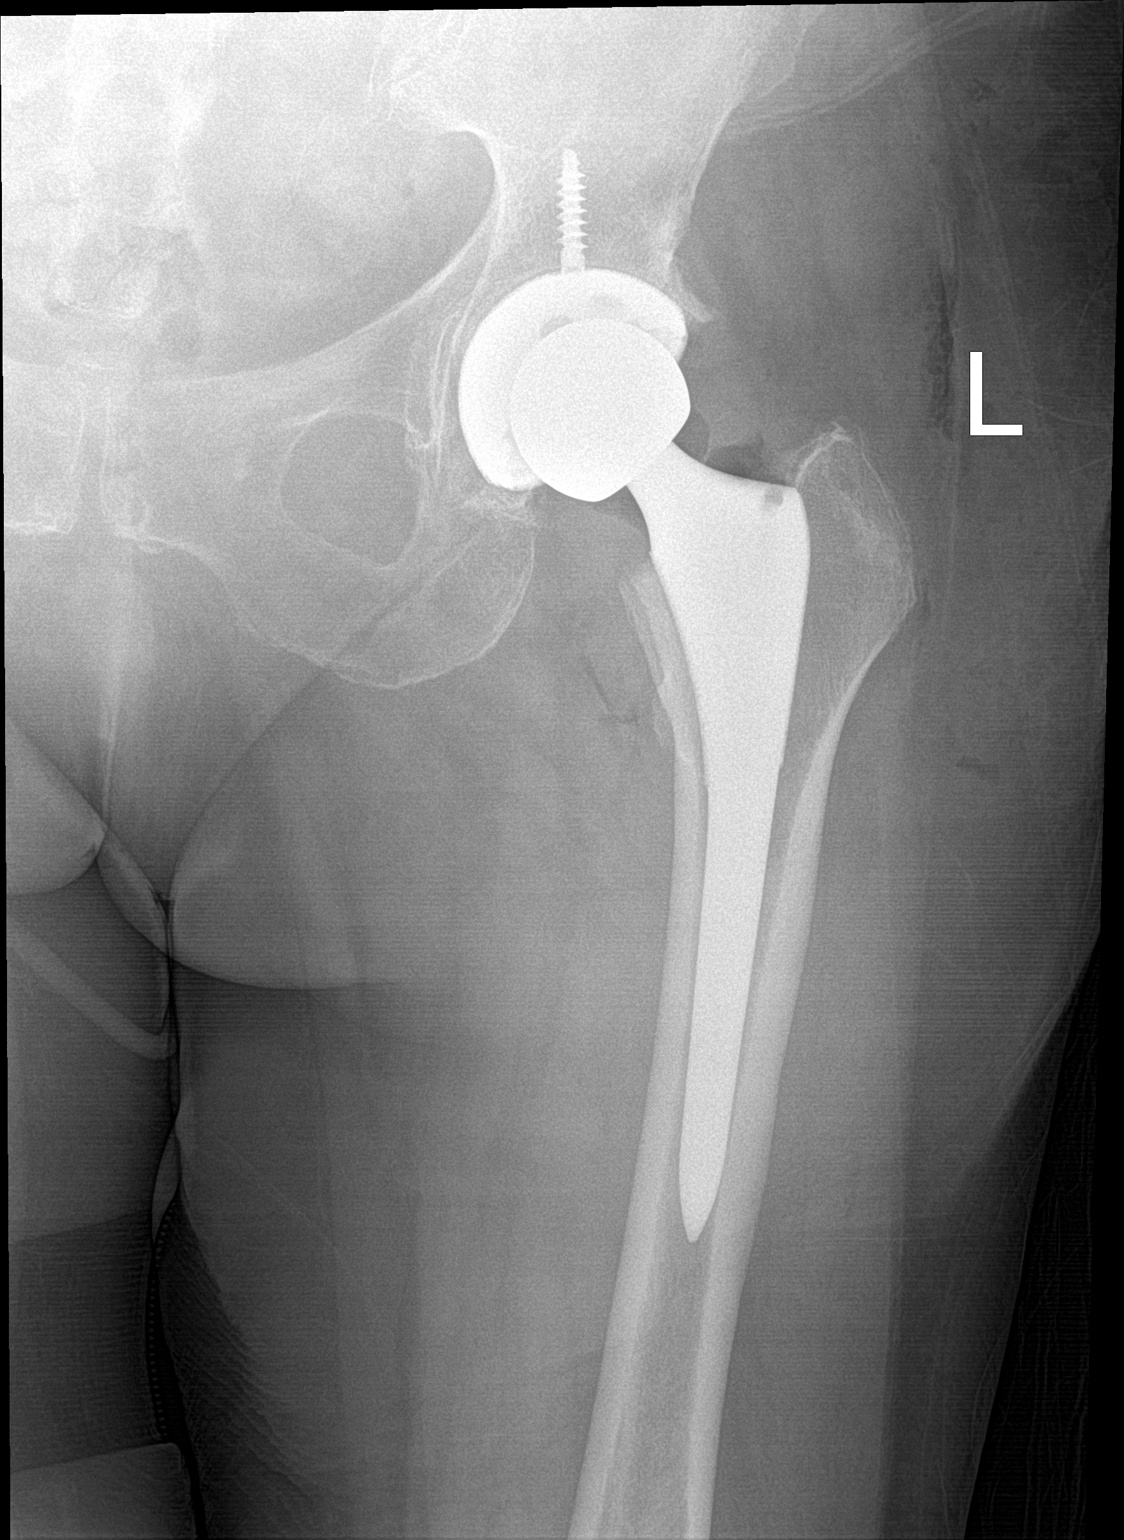

[1 of 1 positions shown; findings below may reference images not displayed]

FINDINGS: There are expected postsurgical changes related to total hip
arthroplasty on the left. Alignment appears near anatomic. There is
subcutaneous gas and overlying soft tissue edema. There are advanced
degenerative changes of the right femoroacetabular joint peer
meters.
IMPRESSION: Expected postsurgical changes related to total hip arthroplasty on
the left.

## 2020-02-16 DIAGNOSIS — L820 Inflamed seborrheic keratosis: Secondary | ICD-10-CM | POA: Diagnosis not present

## 2020-02-16 DIAGNOSIS — L720 Epidermal cyst: Secondary | ICD-10-CM | POA: Diagnosis not present

## 2020-02-16 DIAGNOSIS — Z7901 Long term (current) use of anticoagulants: Secondary | ICD-10-CM | POA: Diagnosis not present

## 2020-02-16 DIAGNOSIS — L821 Other seborrheic keratosis: Secondary | ICD-10-CM | POA: Diagnosis not present

## 2020-02-16 DIAGNOSIS — D2262 Melanocytic nevi of left upper limb, including shoulder: Secondary | ICD-10-CM | POA: Diagnosis not present

## 2020-02-16 DIAGNOSIS — D225 Melanocytic nevi of trunk: Secondary | ICD-10-CM | POA: Diagnosis not present

## 2020-02-16 DIAGNOSIS — L738 Other specified follicular disorders: Secondary | ICD-10-CM | POA: Diagnosis not present

## 2020-02-16 DIAGNOSIS — Z85828 Personal history of other malignant neoplasm of skin: Secondary | ICD-10-CM | POA: Diagnosis not present

## 2020-02-16 DIAGNOSIS — D1801 Hemangioma of skin and subcutaneous tissue: Secondary | ICD-10-CM | POA: Diagnosis not present

## 2020-02-17 ENCOUNTER — Other Ambulatory Visit: Payer: Self-pay | Admitting: Obstetrics & Gynecology

## 2020-02-17 ENCOUNTER — Ambulatory Visit (INDEPENDENT_AMBULATORY_CARE_PROVIDER_SITE_OTHER): Payer: Medicare Other

## 2020-02-17 ENCOUNTER — Other Ambulatory Visit: Payer: Self-pay

## 2020-02-17 DIAGNOSIS — R300 Dysuria: Secondary | ICD-10-CM | POA: Diagnosis not present

## 2020-02-17 LAB — POCT URINALYSIS DIPSTICK
Appearance: ABNORMAL
Bilirubin, UA: NEGATIVE
Glucose, UA: NEGATIVE
Ketones, UA: NEGATIVE
Leukocytes, UA: NEGATIVE
Nitrite, UA: POSITIVE
Odor: ABNORMAL
Protein, UA: NEGATIVE
Spec Grav, UA: 1.015 (ref 1.010–1.025)
Urobilinogen, UA: 0.2 E.U./dL
pH, UA: 6 (ref 5.0–8.0)

## 2020-02-17 MED ORDER — AMOXICILLIN 500 MG PO CAPS: 500.0000 mg | ORAL_CAPSULE | Freq: Three times a day (TID) | ORAL | 2 refills | Status: DC

## 2020-02-17 NOTE — Progress Notes (Signed)
Let her know UTI diagnosed, and will call in antibiotic.  Appt if no better by Monday

## 2020-02-17 NOTE — Progress Notes (Signed)
Pt aware.

## 2020-02-17 NOTE — Progress Notes (Signed)
Patient present for urinalysis per Dr. Kenton Kingfisher instructions. Performed and results in chart (+Nit, 5-10 Blood, Odor) all else negative. C&S Drawn.

## 2020-02-21 LAB — URINE CULTURE

## 2020-02-25 DIAGNOSIS — Z7901 Long term (current) use of anticoagulants: Secondary | ICD-10-CM | POA: Diagnosis not present

## 2020-02-25 DIAGNOSIS — N390 Urinary tract infection, site not specified: Secondary | ICD-10-CM | POA: Diagnosis not present

## 2020-02-28 DIAGNOSIS — E119 Type 2 diabetes mellitus without complications: Secondary | ICD-10-CM | POA: Diagnosis not present

## 2020-03-09 DIAGNOSIS — Z7901 Long term (current) use of anticoagulants: Secondary | ICD-10-CM | POA: Diagnosis not present

## 2020-03-22 DIAGNOSIS — Z7901 Long term (current) use of anticoagulants: Secondary | ICD-10-CM | POA: Diagnosis not present

## 2020-04-05 ENCOUNTER — Other Ambulatory Visit: Payer: Self-pay

## 2020-04-05 ENCOUNTER — Encounter: Payer: Self-pay | Admitting: Ophthalmology

## 2020-04-06 NOTE — Discharge Instructions (Signed)

## 2020-04-07 ENCOUNTER — Other Ambulatory Visit: Payer: Self-pay

## 2020-04-07 ENCOUNTER — Other Ambulatory Visit
Admission: RE | Admit: 2020-04-07 | Discharge: 2020-04-07 | Disposition: A | Discharge status: Home / Self Care | Payer: Medicare Other | Source: Ambulatory Visit | Attending: Ophthalmology | Admitting: Ophthalmology

## 2020-04-07 DIAGNOSIS — Z01812 Encounter for preprocedural laboratory examination: Secondary | ICD-10-CM | POA: Insufficient documentation

## 2020-04-07 DIAGNOSIS — Z20822 Contact with and (suspected) exposure to covid-19: Secondary | ICD-10-CM | POA: Diagnosis not present

## 2020-04-08 LAB — SARS CORONAVIRUS 2 (TAT 6-24 HRS): SARS Coronavirus 2: NEGATIVE

## 2020-04-11 ENCOUNTER — Ambulatory Visit
Admission: RE | Admit: 2020-04-11 | Discharge: 2020-04-11 | Disposition: A | Discharge status: Home / Self Care | Payer: Medicare Other | Attending: Ophthalmology | Admitting: Ophthalmology

## 2020-04-11 ENCOUNTER — Encounter: Payer: Self-pay | Admitting: Ophthalmology

## 2020-04-11 ENCOUNTER — Ambulatory Visit: Payer: Medicare Other | Admitting: Anesthesiology

## 2020-04-11 ENCOUNTER — Other Ambulatory Visit: Payer: Self-pay

## 2020-04-11 ENCOUNTER — Encounter
Admission: RE | Disposition: A | Discharge status: Home / Self Care | Payer: Self-pay | Source: Home / Self Care | Attending: Ophthalmology

## 2020-04-11 DIAGNOSIS — E1136 Type 2 diabetes mellitus with diabetic cataract: Secondary | ICD-10-CM | POA: Diagnosis not present

## 2020-04-11 DIAGNOSIS — E7800 Pure hypercholesterolemia, unspecified: Secondary | ICD-10-CM | POA: Diagnosis not present

## 2020-04-11 DIAGNOSIS — Z96642 Presence of left artificial hip joint: Secondary | ICD-10-CM | POA: Insufficient documentation

## 2020-04-11 DIAGNOSIS — Z7901 Long term (current) use of anticoagulants: Secondary | ICD-10-CM | POA: Insufficient documentation

## 2020-04-11 DIAGNOSIS — Z888 Allergy status to other drugs, medicaments and biological substances status: Secondary | ICD-10-CM | POA: Diagnosis not present

## 2020-04-11 DIAGNOSIS — H2512 Age-related nuclear cataract, left eye: Secondary | ICD-10-CM | POA: Diagnosis present

## 2020-04-11 DIAGNOSIS — D6851 Activated protein C resistance: Secondary | ICD-10-CM | POA: Insufficient documentation

## 2020-04-11 DIAGNOSIS — M1990 Unspecified osteoarthritis, unspecified site: Secondary | ICD-10-CM | POA: Insufficient documentation

## 2020-04-11 DIAGNOSIS — I69398 Other sequelae of cerebral infarction: Secondary | ICD-10-CM | POA: Insufficient documentation

## 2020-04-11 DIAGNOSIS — Z79899 Other long term (current) drug therapy: Secondary | ICD-10-CM | POA: Diagnosis not present

## 2020-04-11 DIAGNOSIS — Z885 Allergy status to narcotic agent status: Secondary | ICD-10-CM | POA: Insufficient documentation

## 2020-04-11 DIAGNOSIS — Z87891 Personal history of nicotine dependence: Secondary | ICD-10-CM | POA: Insufficient documentation

## 2020-04-11 DIAGNOSIS — I10 Essential (primary) hypertension: Secondary | ICD-10-CM | POA: Diagnosis not present

## 2020-04-11 DIAGNOSIS — R2689 Other abnormalities of gait and mobility: Secondary | ICD-10-CM | POA: Diagnosis not present

## 2020-04-11 HISTORY — DX: Presence of dental prosthetic device (complete) (partial): Z97.2

## 2020-04-11 HISTORY — PX: CATARACT EXTRACTION W/PHACO: SHX586

## 2020-04-11 HISTORY — DX: Prediabetes: R73.03

## 2020-04-11 HISTORY — DX: Family history of other specified conditions: Z84.89

## 2020-04-11 SURGERY — PHACOEMULSIFICATION, CATARACT, WITH IOL INSERTION
Anesthesia: Monitor Anesthesia Care | Site: Eye | Laterality: Left

## 2020-04-11 MED ORDER — TETRACAINE HCL 0.5 % OP SOLN
1.0000 [drp] | OPHTHALMIC | Status: DC | PRN
  Administered 2020-04-11 (×3): 1 [drp] via OPHTHALMIC

## 2020-04-11 MED ORDER — ACETAMINOPHEN 10 MG/ML IV SOLN: 1000.0000 mg | Freq: Once | INTRAVENOUS | Status: DC | PRN

## 2020-04-11 MED ORDER — BRIMONIDINE TARTRATE-TIMOLOL 0.2-0.5 % OP SOLN
OPHTHALMIC | Status: DC | PRN
  Administered 2020-04-11: 1 [drp] via OPHTHALMIC

## 2020-04-11 MED ORDER — EPINEPHRINE PF 1 MG/ML IJ SOLN
INTRAOCULAR | Status: DC | PRN
  Administered 2020-04-11: 38 mL via OPHTHALMIC

## 2020-04-11 MED ORDER — ONDANSETRON HCL 4 MG/2ML IJ SOLN: 4.0000 mg | Freq: Once | INTRAMUSCULAR | Status: DC | PRN

## 2020-04-11 MED ORDER — MOXIFLOXACIN HCL 0.5 % OP SOLN
OPHTHALMIC | Status: DC | PRN
  Administered 2020-04-11: 0.2 mL via OPHTHALMIC

## 2020-04-11 MED ORDER — ARMC OPHTHALMIC DILATING DROPS
1.0000 "application " | OPHTHALMIC | Status: DC | PRN
  Administered 2020-04-11 (×3): 1 via OPHTHALMIC

## 2020-04-11 MED ORDER — LACTATED RINGERS IV SOLN: INTRAVENOUS | Status: DC

## 2020-04-11 MED ORDER — FENTANYL CITRATE (PF) 100 MCG/2ML IJ SOLN
INTRAMUSCULAR | Status: DC | PRN
  Administered 2020-04-11: 50 ug via INTRAVENOUS

## 2020-04-11 MED ORDER — NA CHONDROIT SULF-NA HYALURON 40-17 MG/ML IO SOLN
INTRAOCULAR | Status: DC | PRN
  Administered 2020-04-11: 1 mL via INTRAOCULAR

## 2020-04-11 MED ORDER — MIDAZOLAM HCL 2 MG/2ML IJ SOLN
INTRAMUSCULAR | Status: DC | PRN
  Administered 2020-04-11: 1 mg via INTRAVENOUS

## 2020-04-11 MED ORDER — LIDOCAINE HCL (PF) 2 % IJ SOLN
INTRAOCULAR | Status: DC | PRN
  Administered 2020-04-11: 2 mL

## 2020-04-11 SURGICAL SUPPLY — 21 items
CANNULA ANT/CHMB 27G (MISCELLANEOUS) ×2 IMPLANT
CANNULA ANT/CHMB 27GA (MISCELLANEOUS) ×4 IMPLANT
GLOVE SURG LX 8.0 MICRO (GLOVE) ×1
GLOVE SURG LX STRL 8.0 MICRO (GLOVE) ×1 IMPLANT
GLOVE SURG TRIUMPH 8.0 PF LTX (GLOVE) ×2 IMPLANT
GOWN STRL REUS W/ TWL LRG LVL3 (GOWN DISPOSABLE) ×2 IMPLANT
GOWN STRL REUS W/TWL LRG LVL3 (GOWN DISPOSABLE) ×4
LENS IOL ACRSF IQ TRC 4 18.5 IMPLANT
LENS IOL ACRYSOF IQ TORIC 18.5 ×2 IMPLANT
LENS IOL IQ TORIC 4 18.5 ×1 IMPLANT
MARKER SKIN DUAL TIP RULER LAB (MISCELLANEOUS) ×2 IMPLANT
NDL FILTER BLUNT 18X1 1/2 (NEEDLE) ×1 IMPLANT
NEEDLE FILTER BLUNT 18X 1/2SAF (NEEDLE) ×1
NEEDLE FILTER BLUNT 18X1 1/2 (NEEDLE) ×1 IMPLANT
PACK EYE AFTER SURG (MISCELLANEOUS) ×2 IMPLANT
PACK OPTHALMIC (MISCELLANEOUS) ×2 IMPLANT
PACK PORFILIO (MISCELLANEOUS) ×2 IMPLANT
SYR 3ML LL SCALE MARK (SYRINGE) ×2 IMPLANT
SYR TB 1ML LUER SLIP (SYRINGE) ×2 IMPLANT
WATER STERILE IRR 250ML POUR (IV SOLUTION) ×2 IMPLANT
WIPE NON LINTING 3.25X3.25 (MISCELLANEOUS) ×2 IMPLANT

## 2020-04-11 NOTE — Op Note (Signed)
PREOPERATIVE DIAGNOSIS:  Nuclear sclerotic cataract of the left eye.   POSTOPERATIVE DIAGNOSIS:  Nuclear sclerotic cataract of the left eye.   OPERATIVE PROCEDURE: Procedure(s): CATARACT EXTRACTION PHACO AND INTRAOCULAR LENS PLACEMENT (IOC) LEFT DIABETIC TORIC LENS  6.05 00:34.9   SURGEON:  [name], MD.   ANESTHESIA: 1.      Managed anesthesia care. 2.     0.56ml os Shugarcaine was instilled following the paracentesis [email or web address]   COMPLICATIONS:  None.   TECHNIQUE:   Stop and chop    DESCRIPTION OF PROCEDURE:  The patient was examined and consented in the preoperative holding area where the aforementioned topical anesthesia was applied to the left eye.  The patient was brought back to the Operating Room where he was sat upright on the gurney and given a target to fixate upon while the eye was marked at the 3:00 and 9:00 position.  The patient was then reclined on the operating table.  The eye was prepped and draped in the usual sterile ophthalmic fashion and a lid speculum was placed. A paracentesis was created with the side port blade and the anterior chamber was filled with viscoelastic. A near clear corneal incision was performed with the steel keratome. A continuous curvilinear capsulorrhexis was performed with a cystotome followed by the capsulorrhexis forceps. Hydrodissection and hydrodelineation were carried out with BSS on a blunt cannula. The lens was removed in a stop and chop technique and the remaining cortical material was removed with the irrigation-aspiration handpiece. The eye was inflated with viscoelastic and the [id number] lens was placed in the eye and rotated to within a few degrees of the predetermined orientation.  The remaining viscoelastic was removed from the eye.  The [place] hook was used to rotate the toric lens into its final resting place at 090  degrees.  0.1 ml of Vigamox was placed in the anterior chamber. The eye was inflated to a physiologic pressure and  found to be watertight.  The eye was dressed with Vigamox. The patient was given protective glasses to wear throughout the day and a shield with which to sleep tonight. The patient was also given drops with which to begin a drop regimen today and will follow-up with me in one day. Implant Name Type Inv. Item Serial No. Manufacturer Lot No. LRB No. Used Action  LENS IOL TORIC 18.5 - [id number]  LENS IOL TORIC 18.5 [id number] ALCON  Left 1 Implanted   Procedure(s): CATARACT EXTRACTION PHACO AND INTRAOCULAR LENS PLACEMENT (IOC) LEFT DIABETIC TORIC LENS  6.05 00:34.9 (Left)  Electronically signed: [name] 6/29/202110:43 AM

## 2020-04-11 NOTE — Anesthesia Postprocedure Evaluation (Signed)
Anesthesia Post Note  Patient: [name]  Procedure(s) Performed: CATARACT EXTRACTION PHACO AND INTRAOCULAR LENS PLACEMENT (IOC) LEFT DIABETIC TORIC LENS  6.05 00:34.9 (Left Eye)     Patient location during evaluation: PACU Anesthesia Type: MAC Level of consciousness: awake and alert Pain management: pain level controlled Vital Signs Assessment: post-procedure vital signs reviewed and stable Respiratory status: spontaneous breathing, nonlabored ventilation, respiratory function stable and patient connected to nasal cannula oxygen Cardiovascular status: stable and blood pressure returned to baseline Postop Assessment: no apparent nausea or vomiting Anesthetic complications: no   No complications documented.  Lachae Hohler A  Bannon Giammarco

## 2020-04-11 NOTE — Anesthesia Procedure Notes (Signed)
Procedure Name: MAC Date/Time: 04/11/2020 10:23 AM Performed by: Cameron Ali, CRNA Pre-anesthesia Checklist: Patient identified, Emergency Drugs available, Suction available, Timeout performed and Patient being monitored Patient Re-evaluated:Patient Re-evaluated prior to induction Oxygen Delivery Method: Nasal cannula Placement Confirmation: positive ETCO2

## 2020-04-11 NOTE — H&P (Signed)
All labs reviewed. Abnormal studies sent to patients PCP when indicated.  Previous H&P reviewed, patient examined, there are NO CHANGES.  Tiffany Nuccio Porfilio6/29/202110:15 AM

## 2020-04-11 NOTE — Transfer of Care (Signed)
Immediate Anesthesia Transfer of Care Note  Patient: [name]  Procedure(s) Performed: CATARACT EXTRACTION PHACO AND INTRAOCULAR LENS PLACEMENT (IOC) LEFT DIABETIC TORIC LENS  6.05 00:34.9 (Left Eye)  Patient Location: PACU  Anesthesia Type: MAC  Level of Consciousness: awake, alert  and patient cooperative  Airway and Oxygen Therapy: Patient Spontanous Breathing and Patient connected to supplemental oxygen  Post-op Assessment: Post-op Vital signs reviewed, Patient's Cardiovascular Status Stable, Respiratory Function Stable, Patent Airway and No signs of Nausea or vomiting  Post-op Vital Signs: Reviewed and stable  Complications: No complications documented.

## 2020-04-11 NOTE — Anesthesia Preprocedure Evaluation (Signed)
Anesthesia Evaluation  Patient identified by MRN, date of birth, ID band Patient awake    Reviewed: Allergy & Precautions, NPO status , Patient's Chart, lab work & pertinent test results, reviewed documented beta blocker date and time   History of Anesthesia Complications (+) Family history of anesthesia reaction and history of anesthetic complications (Daughter "paralyzed" after surgery)  Airway Mallampati: III  TM Distance: >3 FB Neck ROM: Limited    Dental  (+) Partial Upper, Chipped, Poor Dentition   Pulmonary former smoker,    breath sounds clear to auscultation       Cardiovascular Exercise Tolerance: Poor hypertension, (-) angina(-) DOE  Rhythm:Regular Rate:Normal   HLD   Neuro/Psych CVA (2010, R-sided weakness, gait changes), Residual Symptoms    GI/Hepatic neg GERD  ,  Endo/Other  diabetes  Renal/GU      Musculoskeletal  (+) Arthritis ,   Abdominal   Peds  Hematology  (+) Blood dyscrasia (Factor V Leiden, lupus anticoagulant, on warfarin), ,   Anesthesia Other Findings   Reproductive/Obstetrics                            Anesthesia Physical Anesthesia Plan  ASA: III  Anesthesia Plan: MAC   Post-op Pain Management:    Induction: Intravenous  PONV Risk Score and Plan: 2 and TIVA, Midazolam and Treatment may vary due to age or medical condition  Airway Management Planned: Nasal Cannula  Additional Equipment:   Intra-op Plan:   Post-operative Plan:   Informed Consent: I have reviewed the patients History and Physical, chart, labs and discussed the procedure including the risks, benefits and alternatives for the proposed anesthesia with the patient or authorized representative who has indicated his/her understanding and acceptance.       Plan Discussed with: CRNA and Anesthesiologist  Anesthesia Plan Comments: (Interesting family history of daughter being  "paralyzed" after surgery. Per the patient, the daughter was discharged to home on POD #0. The daughter was initially fine, but a few hours later was "paralyazed."  The patient called the hospital [facility]), and she was told it was a reaction to anesthesia and that it would wear off soon. After 5 hours, the daughter was fine. She never had any difficulty breathing while she was "paralyzed." The patient had a general anesthetic with volatile and rocuronium in 03/2019 without incident.)      Anesthesia Quick Evaluation

## 2020-04-12 ENCOUNTER — Encounter: Payer: Self-pay | Admitting: Ophthalmology

## 2020-05-08 DIAGNOSIS — Z7901 Long term (current) use of anticoagulants: Secondary | ICD-10-CM | POA: Diagnosis not present

## 2020-05-11 DIAGNOSIS — Z7901 Long term (current) use of anticoagulants: Secondary | ICD-10-CM | POA: Diagnosis not present

## 2020-05-18 DIAGNOSIS — Z7901 Long term (current) use of anticoagulants: Secondary | ICD-10-CM | POA: Diagnosis not present

## 2020-06-05 DIAGNOSIS — Z7901 Long term (current) use of anticoagulants: Secondary | ICD-10-CM | POA: Diagnosis not present

## 2020-06-16 DIAGNOSIS — Z7901 Long term (current) use of anticoagulants: Secondary | ICD-10-CM | POA: Diagnosis not present

## 2020-06-28 DIAGNOSIS — F329 Major depressive disorder, single episode, unspecified: Secondary | ICD-10-CM | POA: Diagnosis not present

## 2020-06-28 DIAGNOSIS — R8290 Unspecified abnormal findings in urine: Secondary | ICD-10-CM | POA: Diagnosis not present

## 2020-06-28 DIAGNOSIS — M25551 Pain in right hip: Secondary | ICD-10-CM | POA: Diagnosis not present

## 2020-06-28 DIAGNOSIS — I69959 Hemiplegia and hemiparesis following unspecified cerebrovascular disease affecting unspecified side: Secondary | ICD-10-CM | POA: Diagnosis not present

## 2020-06-28 DIAGNOSIS — R2689 Other abnormalities of gait and mobility: Secondary | ICD-10-CM | POA: Diagnosis not present

## 2020-06-28 DIAGNOSIS — E119 Type 2 diabetes mellitus without complications: Secondary | ICD-10-CM | POA: Diagnosis not present

## 2020-06-28 DIAGNOSIS — N390 Urinary tract infection, site not specified: Secondary | ICD-10-CM | POA: Diagnosis not present

## 2020-06-28 DIAGNOSIS — R350 Frequency of micturition: Secondary | ICD-10-CM | POA: Diagnosis not present

## 2020-06-28 DIAGNOSIS — R82998 Other abnormal findings in urine: Secondary | ICD-10-CM | POA: Diagnosis not present

## 2020-06-28 DIAGNOSIS — E7849 Other hyperlipidemia: Secondary | ICD-10-CM | POA: Diagnosis not present

## 2020-06-28 DIAGNOSIS — Z7901 Long term (current) use of anticoagulants: Secondary | ICD-10-CM | POA: Diagnosis not present

## 2020-06-28 DIAGNOSIS — Z23 Encounter for immunization: Secondary | ICD-10-CM | POA: Diagnosis not present

## 2020-06-28 DIAGNOSIS — I10 Essential (primary) hypertension: Secondary | ICD-10-CM | POA: Diagnosis not present

## 2020-06-28 DIAGNOSIS — Z0000 Encounter for general adult medical examination without abnormal findings: Secondary | ICD-10-CM | POA: Diagnosis not present

## 2020-06-28 DIAGNOSIS — M545 Low back pain: Secondary | ICD-10-CM | POA: Diagnosis not present

## 2020-06-28 DIAGNOSIS — D689 Coagulation defect, unspecified: Secondary | ICD-10-CM | POA: Diagnosis not present

## 2020-06-28 DIAGNOSIS — M859 Disorder of bone density and structure, unspecified: Secondary | ICD-10-CM | POA: Diagnosis not present

## 2020-06-28 DIAGNOSIS — R413 Other amnesia: Secondary | ICD-10-CM | POA: Diagnosis not present

## 2020-07-12 DIAGNOSIS — Z23 Encounter for immunization: Secondary | ICD-10-CM | POA: Diagnosis not present

## 2020-07-14 DIAGNOSIS — S52571A Other intraarticular fracture of lower end of right radius, initial encounter for closed fracture: Secondary | ICD-10-CM | POA: Diagnosis not present

## 2020-07-17 DIAGNOSIS — M25511 Pain in right shoulder: Secondary | ICD-10-CM | POA: Diagnosis not present

## 2020-07-17 DIAGNOSIS — M25551 Pain in right hip: Secondary | ICD-10-CM | POA: Diagnosis not present

## 2020-07-17 DIAGNOSIS — M25531 Pain in right wrist: Secondary | ICD-10-CM | POA: Diagnosis not present

## 2020-07-18 ENCOUNTER — Other Ambulatory Visit: Payer: Self-pay | Admitting: Internal Medicine

## 2020-07-18 DIAGNOSIS — M8580 Other specified disorders of bone density and structure, unspecified site: Secondary | ICD-10-CM

## 2020-07-28 DIAGNOSIS — E1136 Type 2 diabetes mellitus with diabetic cataract: Secondary | ICD-10-CM | POA: Diagnosis not present

## 2020-07-28 DIAGNOSIS — S62101D Fracture of unspecified carpal bone, right wrist, subsequent encounter for fracture with routine healing: Secondary | ICD-10-CM | POA: Diagnosis not present

## 2020-07-28 DIAGNOSIS — I69951 Hemiplegia and hemiparesis following unspecified cerebrovascular disease affecting right dominant side: Secondary | ICD-10-CM | POA: Diagnosis not present

## 2020-07-28 DIAGNOSIS — D6851 Activated protein C resistance: Secondary | ICD-10-CM | POA: Diagnosis not present

## 2020-07-28 DIAGNOSIS — Q283 Other malformations of cerebral vessels: Secondary | ICD-10-CM | POA: Diagnosis not present

## 2020-07-28 DIAGNOSIS — Z9181 History of falling: Secondary | ICD-10-CM | POA: Diagnosis not present

## 2020-07-28 DIAGNOSIS — M1612 Unilateral primary osteoarthritis, left hip: Secondary | ICD-10-CM | POA: Diagnosis not present

## 2020-07-28 DIAGNOSIS — Z7901 Long term (current) use of anticoagulants: Secondary | ICD-10-CM | POA: Diagnosis not present

## 2020-07-28 DIAGNOSIS — M329 Systemic lupus erythematosus, unspecified: Secondary | ICD-10-CM | POA: Diagnosis not present

## 2020-07-28 DIAGNOSIS — Z96642 Presence of left artificial hip joint: Secondary | ICD-10-CM | POA: Diagnosis not present

## 2020-07-28 DIAGNOSIS — I10 Essential (primary) hypertension: Secondary | ICD-10-CM | POA: Diagnosis not present

## 2020-07-29 DIAGNOSIS — I10 Essential (primary) hypertension: Secondary | ICD-10-CM | POA: Diagnosis not present

## 2020-07-29 DIAGNOSIS — E1136 Type 2 diabetes mellitus with diabetic cataract: Secondary | ICD-10-CM | POA: Diagnosis not present

## 2020-07-29 DIAGNOSIS — Q283 Other malformations of cerebral vessels: Secondary | ICD-10-CM | POA: Diagnosis not present

## 2020-07-29 DIAGNOSIS — D6851 Activated protein C resistance: Secondary | ICD-10-CM | POA: Diagnosis not present

## 2020-07-29 DIAGNOSIS — S62101D Fracture of unspecified carpal bone, right wrist, subsequent encounter for fracture with routine healing: Secondary | ICD-10-CM | POA: Diagnosis not present

## 2020-07-29 DIAGNOSIS — M329 Systemic lupus erythematosus, unspecified: Secondary | ICD-10-CM | POA: Diagnosis not present

## 2020-07-31 DIAGNOSIS — M25531 Pain in right wrist: Secondary | ICD-10-CM | POA: Diagnosis not present

## 2020-08-01 DIAGNOSIS — I10 Essential (primary) hypertension: Secondary | ICD-10-CM | POA: Diagnosis not present

## 2020-08-01 DIAGNOSIS — S62101D Fracture of unspecified carpal bone, right wrist, subsequent encounter for fracture with routine healing: Secondary | ICD-10-CM | POA: Diagnosis not present

## 2020-08-01 DIAGNOSIS — E1136 Type 2 diabetes mellitus with diabetic cataract: Secondary | ICD-10-CM | POA: Diagnosis not present

## 2020-08-01 DIAGNOSIS — M329 Systemic lupus erythematosus, unspecified: Secondary | ICD-10-CM | POA: Diagnosis not present

## 2020-08-01 DIAGNOSIS — D6851 Activated protein C resistance: Secondary | ICD-10-CM | POA: Diagnosis not present

## 2020-08-01 DIAGNOSIS — Q283 Other malformations of cerebral vessels: Secondary | ICD-10-CM | POA: Diagnosis not present

## 2020-08-03 DIAGNOSIS — Q283 Other malformations of cerebral vessels: Secondary | ICD-10-CM | POA: Diagnosis not present

## 2020-08-03 DIAGNOSIS — D6851 Activated protein C resistance: Secondary | ICD-10-CM | POA: Diagnosis not present

## 2020-08-03 DIAGNOSIS — S62101D Fracture of unspecified carpal bone, right wrist, subsequent encounter for fracture with routine healing: Secondary | ICD-10-CM | POA: Diagnosis not present

## 2020-08-03 DIAGNOSIS — I10 Essential (primary) hypertension: Secondary | ICD-10-CM | POA: Diagnosis not present

## 2020-08-03 DIAGNOSIS — E1136 Type 2 diabetes mellitus with diabetic cataract: Secondary | ICD-10-CM | POA: Diagnosis not present

## 2020-08-03 DIAGNOSIS — M329 Systemic lupus erythematosus, unspecified: Secondary | ICD-10-CM | POA: Diagnosis not present

## 2020-08-09 DIAGNOSIS — Q283 Other malformations of cerebral vessels: Secondary | ICD-10-CM | POA: Diagnosis not present

## 2020-08-09 DIAGNOSIS — D6851 Activated protein C resistance: Secondary | ICD-10-CM | POA: Diagnosis not present

## 2020-08-09 DIAGNOSIS — M329 Systemic lupus erythematosus, unspecified: Secondary | ICD-10-CM | POA: Diagnosis not present

## 2020-08-09 DIAGNOSIS — E1136 Type 2 diabetes mellitus with diabetic cataract: Secondary | ICD-10-CM | POA: Diagnosis not present

## 2020-08-09 DIAGNOSIS — S62101D Fracture of unspecified carpal bone, right wrist, subsequent encounter for fracture with routine healing: Secondary | ICD-10-CM | POA: Diagnosis not present

## 2020-08-09 DIAGNOSIS — I10 Essential (primary) hypertension: Secondary | ICD-10-CM | POA: Diagnosis not present

## 2020-08-10 DIAGNOSIS — Q283 Other malformations of cerebral vessels: Secondary | ICD-10-CM | POA: Diagnosis not present

## 2020-08-10 DIAGNOSIS — S62101D Fracture of unspecified carpal bone, right wrist, subsequent encounter for fracture with routine healing: Secondary | ICD-10-CM | POA: Diagnosis not present

## 2020-08-10 DIAGNOSIS — M329 Systemic lupus erythematosus, unspecified: Secondary | ICD-10-CM | POA: Diagnosis not present

## 2020-08-10 DIAGNOSIS — D6851 Activated protein C resistance: Secondary | ICD-10-CM | POA: Diagnosis not present

## 2020-08-10 DIAGNOSIS — I10 Essential (primary) hypertension: Secondary | ICD-10-CM | POA: Diagnosis not present

## 2020-08-10 DIAGNOSIS — E1136 Type 2 diabetes mellitus with diabetic cataract: Secondary | ICD-10-CM | POA: Diagnosis not present

## 2020-08-11 DIAGNOSIS — M329 Systemic lupus erythematosus, unspecified: Secondary | ICD-10-CM | POA: Diagnosis not present

## 2020-08-11 DIAGNOSIS — R300 Dysuria: Secondary | ICD-10-CM | POA: Diagnosis not present

## 2020-08-11 DIAGNOSIS — D6851 Activated protein C resistance: Secondary | ICD-10-CM | POA: Diagnosis not present

## 2020-08-11 DIAGNOSIS — Q283 Other malformations of cerebral vessels: Secondary | ICD-10-CM | POA: Diagnosis not present

## 2020-08-11 DIAGNOSIS — E1136 Type 2 diabetes mellitus with diabetic cataract: Secondary | ICD-10-CM | POA: Diagnosis not present

## 2020-08-11 DIAGNOSIS — S62101D Fracture of unspecified carpal bone, right wrist, subsequent encounter for fracture with routine healing: Secondary | ICD-10-CM | POA: Diagnosis not present

## 2020-08-11 DIAGNOSIS — Z87440 Personal history of urinary (tract) infections: Secondary | ICD-10-CM | POA: Diagnosis not present

## 2020-08-11 DIAGNOSIS — I10 Essential (primary) hypertension: Secondary | ICD-10-CM | POA: Diagnosis not present

## 2020-08-16 DIAGNOSIS — Q283 Other malformations of cerebral vessels: Secondary | ICD-10-CM | POA: Diagnosis not present

## 2020-08-16 DIAGNOSIS — D6851 Activated protein C resistance: Secondary | ICD-10-CM | POA: Diagnosis not present

## 2020-08-16 DIAGNOSIS — S62101D Fracture of unspecified carpal bone, right wrist, subsequent encounter for fracture with routine healing: Secondary | ICD-10-CM | POA: Diagnosis not present

## 2020-08-16 DIAGNOSIS — I10 Essential (primary) hypertension: Secondary | ICD-10-CM | POA: Diagnosis not present

## 2020-08-16 DIAGNOSIS — M329 Systemic lupus erythematosus, unspecified: Secondary | ICD-10-CM | POA: Diagnosis not present

## 2020-08-16 DIAGNOSIS — E1136 Type 2 diabetes mellitus with diabetic cataract: Secondary | ICD-10-CM | POA: Diagnosis not present

## 2020-08-17 DIAGNOSIS — S62101D Fracture of unspecified carpal bone, right wrist, subsequent encounter for fracture with routine healing: Secondary | ICD-10-CM | POA: Diagnosis not present

## 2020-08-17 DIAGNOSIS — Q283 Other malformations of cerebral vessels: Secondary | ICD-10-CM | POA: Diagnosis not present

## 2020-08-17 DIAGNOSIS — I10 Essential (primary) hypertension: Secondary | ICD-10-CM | POA: Diagnosis not present

## 2020-08-17 DIAGNOSIS — M329 Systemic lupus erythematosus, unspecified: Secondary | ICD-10-CM | POA: Diagnosis not present

## 2020-08-17 DIAGNOSIS — D6851 Activated protein C resistance: Secondary | ICD-10-CM | POA: Diagnosis not present

## 2020-08-17 DIAGNOSIS — E1136 Type 2 diabetes mellitus with diabetic cataract: Secondary | ICD-10-CM | POA: Diagnosis not present

## 2020-08-18 DIAGNOSIS — M329 Systemic lupus erythematosus, unspecified: Secondary | ICD-10-CM | POA: Diagnosis not present

## 2020-08-18 DIAGNOSIS — S62101D Fracture of unspecified carpal bone, right wrist, subsequent encounter for fracture with routine healing: Secondary | ICD-10-CM | POA: Diagnosis not present

## 2020-08-18 DIAGNOSIS — I10 Essential (primary) hypertension: Secondary | ICD-10-CM | POA: Diagnosis not present

## 2020-08-18 DIAGNOSIS — Q283 Other malformations of cerebral vessels: Secondary | ICD-10-CM | POA: Diagnosis not present

## 2020-08-18 DIAGNOSIS — D6851 Activated protein C resistance: Secondary | ICD-10-CM | POA: Diagnosis not present

## 2020-08-18 DIAGNOSIS — E1136 Type 2 diabetes mellitus with diabetic cataract: Secondary | ICD-10-CM | POA: Diagnosis not present

## 2020-08-21 DIAGNOSIS — E1136 Type 2 diabetes mellitus with diabetic cataract: Secondary | ICD-10-CM | POA: Diagnosis not present

## 2020-08-21 DIAGNOSIS — Q283 Other malformations of cerebral vessels: Secondary | ICD-10-CM | POA: Diagnosis not present

## 2020-08-21 DIAGNOSIS — D6851 Activated protein C resistance: Secondary | ICD-10-CM | POA: Diagnosis not present

## 2020-08-21 DIAGNOSIS — I10 Essential (primary) hypertension: Secondary | ICD-10-CM | POA: Diagnosis not present

## 2020-08-21 DIAGNOSIS — M329 Systemic lupus erythematosus, unspecified: Secondary | ICD-10-CM | POA: Diagnosis not present

## 2020-08-21 DIAGNOSIS — S62101D Fracture of unspecified carpal bone, right wrist, subsequent encounter for fracture with routine healing: Secondary | ICD-10-CM | POA: Diagnosis not present

## 2020-08-23 DIAGNOSIS — S62101D Fracture of unspecified carpal bone, right wrist, subsequent encounter for fracture with routine healing: Secondary | ICD-10-CM | POA: Diagnosis not present

## 2020-08-23 DIAGNOSIS — D6851 Activated protein C resistance: Secondary | ICD-10-CM | POA: Diagnosis not present

## 2020-08-23 DIAGNOSIS — E1136 Type 2 diabetes mellitus with diabetic cataract: Secondary | ICD-10-CM | POA: Diagnosis not present

## 2020-08-23 DIAGNOSIS — I10 Essential (primary) hypertension: Secondary | ICD-10-CM | POA: Diagnosis not present

## 2020-08-23 DIAGNOSIS — M329 Systemic lupus erythematosus, unspecified: Secondary | ICD-10-CM | POA: Diagnosis not present

## 2020-08-23 DIAGNOSIS — Q283 Other malformations of cerebral vessels: Secondary | ICD-10-CM | POA: Diagnosis not present

## 2020-08-24 DIAGNOSIS — Q283 Other malformations of cerebral vessels: Secondary | ICD-10-CM | POA: Diagnosis not present

## 2020-08-24 DIAGNOSIS — S62101D Fracture of unspecified carpal bone, right wrist, subsequent encounter for fracture with routine healing: Secondary | ICD-10-CM | POA: Diagnosis not present

## 2020-08-24 DIAGNOSIS — D6851 Activated protein C resistance: Secondary | ICD-10-CM | POA: Diagnosis not present

## 2020-08-24 DIAGNOSIS — E1136 Type 2 diabetes mellitus with diabetic cataract: Secondary | ICD-10-CM | POA: Diagnosis not present

## 2020-08-24 DIAGNOSIS — M329 Systemic lupus erythematosus, unspecified: Secondary | ICD-10-CM | POA: Diagnosis not present

## 2020-08-24 DIAGNOSIS — I10 Essential (primary) hypertension: Secondary | ICD-10-CM | POA: Diagnosis not present

## 2020-08-25 DIAGNOSIS — M329 Systemic lupus erythematosus, unspecified: Secondary | ICD-10-CM | POA: Diagnosis not present

## 2020-08-25 DIAGNOSIS — Q283 Other malformations of cerebral vessels: Secondary | ICD-10-CM | POA: Diagnosis not present

## 2020-08-25 DIAGNOSIS — D6851 Activated protein C resistance: Secondary | ICD-10-CM | POA: Diagnosis not present

## 2020-08-25 DIAGNOSIS — E1136 Type 2 diabetes mellitus with diabetic cataract: Secondary | ICD-10-CM | POA: Diagnosis not present

## 2020-08-25 DIAGNOSIS — S62101D Fracture of unspecified carpal bone, right wrist, subsequent encounter for fracture with routine healing: Secondary | ICD-10-CM | POA: Diagnosis not present

## 2020-08-25 DIAGNOSIS — I10 Essential (primary) hypertension: Secondary | ICD-10-CM | POA: Diagnosis not present

## 2020-08-27 DIAGNOSIS — M1612 Unilateral primary osteoarthritis, left hip: Secondary | ICD-10-CM | POA: Diagnosis not present

## 2020-08-27 DIAGNOSIS — Z9181 History of falling: Secondary | ICD-10-CM | POA: Diagnosis not present

## 2020-08-27 DIAGNOSIS — S62101D Fracture of unspecified carpal bone, right wrist, subsequent encounter for fracture with routine healing: Secondary | ICD-10-CM | POA: Diagnosis not present

## 2020-08-27 DIAGNOSIS — I10 Essential (primary) hypertension: Secondary | ICD-10-CM | POA: Diagnosis not present

## 2020-08-27 DIAGNOSIS — E1136 Type 2 diabetes mellitus with diabetic cataract: Secondary | ICD-10-CM | POA: Diagnosis not present

## 2020-08-27 DIAGNOSIS — Q283 Other malformations of cerebral vessels: Secondary | ICD-10-CM | POA: Diagnosis not present

## 2020-08-27 DIAGNOSIS — M329 Systemic lupus erythematosus, unspecified: Secondary | ICD-10-CM | POA: Diagnosis not present

## 2020-08-27 DIAGNOSIS — Z7901 Long term (current) use of anticoagulants: Secondary | ICD-10-CM | POA: Diagnosis not present

## 2020-08-27 DIAGNOSIS — Z96642 Presence of left artificial hip joint: Secondary | ICD-10-CM | POA: Diagnosis not present

## 2020-08-27 DIAGNOSIS — I69951 Hemiplegia and hemiparesis following unspecified cerebrovascular disease affecting right dominant side: Secondary | ICD-10-CM | POA: Diagnosis not present

## 2020-08-27 DIAGNOSIS — D6851 Activated protein C resistance: Secondary | ICD-10-CM | POA: Diagnosis not present

## 2020-08-28 DIAGNOSIS — M25531 Pain in right wrist: Secondary | ICD-10-CM | POA: Diagnosis not present

## 2020-08-30 DIAGNOSIS — M329 Systemic lupus erythematosus, unspecified: Secondary | ICD-10-CM | POA: Diagnosis not present

## 2020-08-30 DIAGNOSIS — I10 Essential (primary) hypertension: Secondary | ICD-10-CM | POA: Diagnosis not present

## 2020-08-30 DIAGNOSIS — D6851 Activated protein C resistance: Secondary | ICD-10-CM | POA: Diagnosis not present

## 2020-08-30 DIAGNOSIS — Q283 Other malformations of cerebral vessels: Secondary | ICD-10-CM | POA: Diagnosis not present

## 2020-08-30 DIAGNOSIS — E1136 Type 2 diabetes mellitus with diabetic cataract: Secondary | ICD-10-CM | POA: Diagnosis not present

## 2020-08-30 DIAGNOSIS — S62101D Fracture of unspecified carpal bone, right wrist, subsequent encounter for fracture with routine healing: Secondary | ICD-10-CM | POA: Diagnosis not present

## 2020-08-31 DIAGNOSIS — S62101D Fracture of unspecified carpal bone, right wrist, subsequent encounter for fracture with routine healing: Secondary | ICD-10-CM | POA: Diagnosis not present

## 2020-08-31 DIAGNOSIS — E1136 Type 2 diabetes mellitus with diabetic cataract: Secondary | ICD-10-CM | POA: Diagnosis not present

## 2020-08-31 DIAGNOSIS — Q283 Other malformations of cerebral vessels: Secondary | ICD-10-CM | POA: Diagnosis not present

## 2020-08-31 DIAGNOSIS — D6851 Activated protein C resistance: Secondary | ICD-10-CM | POA: Diagnosis not present

## 2020-08-31 DIAGNOSIS — I10 Essential (primary) hypertension: Secondary | ICD-10-CM | POA: Diagnosis not present

## 2020-08-31 DIAGNOSIS — M329 Systemic lupus erythematosus, unspecified: Secondary | ICD-10-CM | POA: Diagnosis not present

## 2020-09-01 DIAGNOSIS — Q283 Other malformations of cerebral vessels: Secondary | ICD-10-CM | POA: Diagnosis not present

## 2020-09-01 DIAGNOSIS — I10 Essential (primary) hypertension: Secondary | ICD-10-CM | POA: Diagnosis not present

## 2020-09-01 DIAGNOSIS — E1136 Type 2 diabetes mellitus with diabetic cataract: Secondary | ICD-10-CM | POA: Diagnosis not present

## 2020-09-01 DIAGNOSIS — M329 Systemic lupus erythematosus, unspecified: Secondary | ICD-10-CM | POA: Diagnosis not present

## 2020-09-01 DIAGNOSIS — D6851 Activated protein C resistance: Secondary | ICD-10-CM | POA: Diagnosis not present

## 2020-09-01 DIAGNOSIS — S62101D Fracture of unspecified carpal bone, right wrist, subsequent encounter for fracture with routine healing: Secondary | ICD-10-CM | POA: Diagnosis not present

## 2020-09-05 DIAGNOSIS — D6851 Activated protein C resistance: Secondary | ICD-10-CM | POA: Diagnosis not present

## 2020-09-05 DIAGNOSIS — S62101D Fracture of unspecified carpal bone, right wrist, subsequent encounter for fracture with routine healing: Secondary | ICD-10-CM | POA: Diagnosis not present

## 2020-09-05 DIAGNOSIS — I10 Essential (primary) hypertension: Secondary | ICD-10-CM | POA: Diagnosis not present

## 2020-09-05 DIAGNOSIS — E1136 Type 2 diabetes mellitus with diabetic cataract: Secondary | ICD-10-CM | POA: Diagnosis not present

## 2020-09-05 DIAGNOSIS — M329 Systemic lupus erythematosus, unspecified: Secondary | ICD-10-CM | POA: Diagnosis not present

## 2020-09-05 DIAGNOSIS — Q283 Other malformations of cerebral vessels: Secondary | ICD-10-CM | POA: Diagnosis not present

## 2020-09-06 DIAGNOSIS — M329 Systemic lupus erythematosus, unspecified: Secondary | ICD-10-CM | POA: Diagnosis not present

## 2020-09-06 DIAGNOSIS — Q283 Other malformations of cerebral vessels: Secondary | ICD-10-CM | POA: Diagnosis not present

## 2020-09-06 DIAGNOSIS — E1136 Type 2 diabetes mellitus with diabetic cataract: Secondary | ICD-10-CM | POA: Diagnosis not present

## 2020-09-06 DIAGNOSIS — S62101D Fracture of unspecified carpal bone, right wrist, subsequent encounter for fracture with routine healing: Secondary | ICD-10-CM | POA: Diagnosis not present

## 2020-09-06 DIAGNOSIS — I10 Essential (primary) hypertension: Secondary | ICD-10-CM | POA: Diagnosis not present

## 2020-09-06 DIAGNOSIS — D6851 Activated protein C resistance: Secondary | ICD-10-CM | POA: Diagnosis not present

## 2020-09-22 DIAGNOSIS — Q283 Other malformations of cerebral vessels: Secondary | ICD-10-CM | POA: Diagnosis not present

## 2020-09-22 DIAGNOSIS — I10 Essential (primary) hypertension: Secondary | ICD-10-CM | POA: Diagnosis not present

## 2020-09-22 DIAGNOSIS — M329 Systemic lupus erythematosus, unspecified: Secondary | ICD-10-CM | POA: Diagnosis not present

## 2020-09-22 DIAGNOSIS — E1136 Type 2 diabetes mellitus with diabetic cataract: Secondary | ICD-10-CM | POA: Diagnosis not present

## 2020-09-22 DIAGNOSIS — S62101D Fracture of unspecified carpal bone, right wrist, subsequent encounter for fracture with routine healing: Secondary | ICD-10-CM | POA: Diagnosis not present

## 2020-09-22 DIAGNOSIS — D6851 Activated protein C resistance: Secondary | ICD-10-CM | POA: Diagnosis not present

## 2020-09-25 DIAGNOSIS — M25531 Pain in right wrist: Secondary | ICD-10-CM | POA: Diagnosis not present

## 2020-10-10 DIAGNOSIS — Z7901 Long term (current) use of anticoagulants: Secondary | ICD-10-CM | POA: Diagnosis not present

## 2020-10-16 DIAGNOSIS — I69351 Hemiplegia and hemiparesis following cerebral infarction affecting right dominant side: Secondary | ICD-10-CM | POA: Diagnosis not present

## 2020-10-16 DIAGNOSIS — D6851 Activated protein C resistance: Secondary | ICD-10-CM | POA: Diagnosis not present

## 2020-10-16 DIAGNOSIS — S62101D Fracture of unspecified carpal bone, right wrist, subsequent encounter for fracture with routine healing: Secondary | ICD-10-CM | POA: Diagnosis not present

## 2020-10-16 DIAGNOSIS — Z7901 Long term (current) use of anticoagulants: Secondary | ICD-10-CM | POA: Diagnosis not present

## 2020-10-16 DIAGNOSIS — Z9181 History of falling: Secondary | ICD-10-CM | POA: Diagnosis not present

## 2020-10-16 DIAGNOSIS — R7303 Prediabetes: Secondary | ICD-10-CM | POA: Diagnosis not present

## 2020-10-16 DIAGNOSIS — I10 Essential (primary) hypertension: Secondary | ICD-10-CM | POA: Diagnosis not present

## 2020-10-16 DIAGNOSIS — S52551D Other extraarticular fracture of lower end of right radius, subsequent encounter for closed fracture with routine healing: Secondary | ICD-10-CM | POA: Diagnosis not present

## 2020-10-17 DIAGNOSIS — S52551D Other extraarticular fracture of lower end of right radius, subsequent encounter for closed fracture with routine healing: Secondary | ICD-10-CM | POA: Diagnosis not present

## 2020-10-17 DIAGNOSIS — I69351 Hemiplegia and hemiparesis following cerebral infarction affecting right dominant side: Secondary | ICD-10-CM | POA: Diagnosis not present

## 2020-10-17 DIAGNOSIS — D6851 Activated protein C resistance: Secondary | ICD-10-CM | POA: Diagnosis not present

## 2020-10-17 DIAGNOSIS — R7303 Prediabetes: Secondary | ICD-10-CM | POA: Diagnosis not present

## 2020-10-17 DIAGNOSIS — S62101D Fracture of unspecified carpal bone, right wrist, subsequent encounter for fracture with routine healing: Secondary | ICD-10-CM | POA: Diagnosis not present

## 2020-10-17 DIAGNOSIS — I10 Essential (primary) hypertension: Secondary | ICD-10-CM | POA: Diagnosis not present

## 2020-10-19 DIAGNOSIS — I10 Essential (primary) hypertension: Secondary | ICD-10-CM | POA: Diagnosis not present

## 2020-10-19 DIAGNOSIS — D6851 Activated protein C resistance: Secondary | ICD-10-CM | POA: Diagnosis not present

## 2020-10-19 DIAGNOSIS — S62101D Fracture of unspecified carpal bone, right wrist, subsequent encounter for fracture with routine healing: Secondary | ICD-10-CM | POA: Diagnosis not present

## 2020-10-19 DIAGNOSIS — R7303 Prediabetes: Secondary | ICD-10-CM | POA: Diagnosis not present

## 2020-10-19 DIAGNOSIS — I69351 Hemiplegia and hemiparesis following cerebral infarction affecting right dominant side: Secondary | ICD-10-CM | POA: Diagnosis not present

## 2020-10-19 DIAGNOSIS — S52551D Other extraarticular fracture of lower end of right radius, subsequent encounter for closed fracture with routine healing: Secondary | ICD-10-CM | POA: Diagnosis not present

## 2020-10-24 DIAGNOSIS — R7303 Prediabetes: Secondary | ICD-10-CM | POA: Diagnosis not present

## 2020-10-24 DIAGNOSIS — I10 Essential (primary) hypertension: Secondary | ICD-10-CM | POA: Diagnosis not present

## 2020-10-24 DIAGNOSIS — S52551D Other extraarticular fracture of lower end of right radius, subsequent encounter for closed fracture with routine healing: Secondary | ICD-10-CM | POA: Diagnosis not present

## 2020-10-24 DIAGNOSIS — D6851 Activated protein C resistance: Secondary | ICD-10-CM | POA: Diagnosis not present

## 2020-10-24 DIAGNOSIS — I69351 Hemiplegia and hemiparesis following cerebral infarction affecting right dominant side: Secondary | ICD-10-CM | POA: Diagnosis not present

## 2020-10-24 DIAGNOSIS — S62101D Fracture of unspecified carpal bone, right wrist, subsequent encounter for fracture with routine healing: Secondary | ICD-10-CM | POA: Diagnosis not present

## 2020-10-26 DIAGNOSIS — S62101D Fracture of unspecified carpal bone, right wrist, subsequent encounter for fracture with routine healing: Secondary | ICD-10-CM | POA: Diagnosis not present

## 2020-10-26 DIAGNOSIS — I69351 Hemiplegia and hemiparesis following cerebral infarction affecting right dominant side: Secondary | ICD-10-CM | POA: Diagnosis not present

## 2020-10-26 DIAGNOSIS — R7303 Prediabetes: Secondary | ICD-10-CM | POA: Diagnosis not present

## 2020-10-26 DIAGNOSIS — S52551D Other extraarticular fracture of lower end of right radius, subsequent encounter for closed fracture with routine healing: Secondary | ICD-10-CM | POA: Diagnosis not present

## 2020-10-26 DIAGNOSIS — I10 Essential (primary) hypertension: Secondary | ICD-10-CM | POA: Diagnosis not present

## 2020-10-26 DIAGNOSIS — D6851 Activated protein C resistance: Secondary | ICD-10-CM | POA: Diagnosis not present

## 2020-10-31 DIAGNOSIS — I69351 Hemiplegia and hemiparesis following cerebral infarction affecting right dominant side: Secondary | ICD-10-CM | POA: Diagnosis not present

## 2020-10-31 DIAGNOSIS — S52551D Other extraarticular fracture of lower end of right radius, subsequent encounter for closed fracture with routine healing: Secondary | ICD-10-CM | POA: Diagnosis not present

## 2020-10-31 DIAGNOSIS — D6851 Activated protein C resistance: Secondary | ICD-10-CM | POA: Diagnosis not present

## 2020-10-31 DIAGNOSIS — S62101D Fracture of unspecified carpal bone, right wrist, subsequent encounter for fracture with routine healing: Secondary | ICD-10-CM | POA: Diagnosis not present

## 2020-10-31 DIAGNOSIS — I10 Essential (primary) hypertension: Secondary | ICD-10-CM | POA: Diagnosis not present

## 2020-10-31 DIAGNOSIS — R7303 Prediabetes: Secondary | ICD-10-CM | POA: Diagnosis not present

## 2020-11-02 DIAGNOSIS — R7303 Prediabetes: Secondary | ICD-10-CM | POA: Diagnosis not present

## 2020-11-02 DIAGNOSIS — S52551D Other extraarticular fracture of lower end of right radius, subsequent encounter for closed fracture with routine healing: Secondary | ICD-10-CM | POA: Diagnosis not present

## 2020-11-02 DIAGNOSIS — I10 Essential (primary) hypertension: Secondary | ICD-10-CM | POA: Diagnosis not present

## 2020-11-02 DIAGNOSIS — S62101D Fracture of unspecified carpal bone, right wrist, subsequent encounter for fracture with routine healing: Secondary | ICD-10-CM | POA: Diagnosis not present

## 2020-11-02 DIAGNOSIS — I69351 Hemiplegia and hemiparesis following cerebral infarction affecting right dominant side: Secondary | ICD-10-CM | POA: Diagnosis not present

## 2020-11-02 DIAGNOSIS — D6851 Activated protein C resistance: Secondary | ICD-10-CM | POA: Diagnosis not present

## 2020-11-06 DIAGNOSIS — R7303 Prediabetes: Secondary | ICD-10-CM | POA: Diagnosis not present

## 2020-11-06 DIAGNOSIS — S62101D Fracture of unspecified carpal bone, right wrist, subsequent encounter for fracture with routine healing: Secondary | ICD-10-CM | POA: Diagnosis not present

## 2020-11-06 DIAGNOSIS — I69351 Hemiplegia and hemiparesis following cerebral infarction affecting right dominant side: Secondary | ICD-10-CM | POA: Diagnosis not present

## 2020-11-06 DIAGNOSIS — D6851 Activated protein C resistance: Secondary | ICD-10-CM | POA: Diagnosis not present

## 2020-11-06 DIAGNOSIS — S52551D Other extraarticular fracture of lower end of right radius, subsequent encounter for closed fracture with routine healing: Secondary | ICD-10-CM | POA: Diagnosis not present

## 2020-11-06 DIAGNOSIS — I10 Essential (primary) hypertension: Secondary | ICD-10-CM | POA: Diagnosis not present

## 2020-11-08 DIAGNOSIS — D6851 Activated protein C resistance: Secondary | ICD-10-CM | POA: Diagnosis not present

## 2020-11-08 DIAGNOSIS — S62101D Fracture of unspecified carpal bone, right wrist, subsequent encounter for fracture with routine healing: Secondary | ICD-10-CM | POA: Diagnosis not present

## 2020-11-08 DIAGNOSIS — R7303 Prediabetes: Secondary | ICD-10-CM | POA: Diagnosis not present

## 2020-11-08 DIAGNOSIS — I10 Essential (primary) hypertension: Secondary | ICD-10-CM | POA: Diagnosis not present

## 2020-11-08 DIAGNOSIS — I69351 Hemiplegia and hemiparesis following cerebral infarction affecting right dominant side: Secondary | ICD-10-CM | POA: Diagnosis not present

## 2020-11-08 DIAGNOSIS — S52551D Other extraarticular fracture of lower end of right radius, subsequent encounter for closed fracture with routine healing: Secondary | ICD-10-CM | POA: Diagnosis not present

## 2020-11-09 DIAGNOSIS — Z7901 Long term (current) use of anticoagulants: Secondary | ICD-10-CM | POA: Diagnosis not present

## 2020-11-09 DIAGNOSIS — D689 Coagulation defect, unspecified: Secondary | ICD-10-CM | POA: Diagnosis not present

## 2020-11-15 DIAGNOSIS — S52551D Other extraarticular fracture of lower end of right radius, subsequent encounter for closed fracture with routine healing: Secondary | ICD-10-CM | POA: Diagnosis not present

## 2020-11-15 DIAGNOSIS — D6851 Activated protein C resistance: Secondary | ICD-10-CM | POA: Diagnosis not present

## 2020-11-15 DIAGNOSIS — R7303 Prediabetes: Secondary | ICD-10-CM | POA: Diagnosis not present

## 2020-11-15 DIAGNOSIS — Z7901 Long term (current) use of anticoagulants: Secondary | ICD-10-CM | POA: Diagnosis not present

## 2020-11-15 DIAGNOSIS — I69351 Hemiplegia and hemiparesis following cerebral infarction affecting right dominant side: Secondary | ICD-10-CM | POA: Diagnosis not present

## 2020-11-15 DIAGNOSIS — S62101D Fracture of unspecified carpal bone, right wrist, subsequent encounter for fracture with routine healing: Secondary | ICD-10-CM | POA: Diagnosis not present

## 2020-11-15 DIAGNOSIS — Z9181 History of falling: Secondary | ICD-10-CM | POA: Diagnosis not present

## 2020-11-15 DIAGNOSIS — I10 Essential (primary) hypertension: Secondary | ICD-10-CM | POA: Diagnosis not present

## 2020-11-16 DIAGNOSIS — S52551D Other extraarticular fracture of lower end of right radius, subsequent encounter for closed fracture with routine healing: Secondary | ICD-10-CM | POA: Diagnosis not present

## 2020-11-16 DIAGNOSIS — I10 Essential (primary) hypertension: Secondary | ICD-10-CM | POA: Diagnosis not present

## 2020-11-16 DIAGNOSIS — I69351 Hemiplegia and hemiparesis following cerebral infarction affecting right dominant side: Secondary | ICD-10-CM | POA: Diagnosis not present

## 2020-11-16 DIAGNOSIS — R7303 Prediabetes: Secondary | ICD-10-CM | POA: Diagnosis not present

## 2020-11-16 DIAGNOSIS — S62101D Fracture of unspecified carpal bone, right wrist, subsequent encounter for fracture with routine healing: Secondary | ICD-10-CM | POA: Diagnosis not present

## 2020-11-16 DIAGNOSIS — D6851 Activated protein C resistance: Secondary | ICD-10-CM | POA: Diagnosis not present

## 2020-11-23 DIAGNOSIS — I69351 Hemiplegia and hemiparesis following cerebral infarction affecting right dominant side: Secondary | ICD-10-CM | POA: Diagnosis not present

## 2020-11-23 DIAGNOSIS — S52551D Other extraarticular fracture of lower end of right radius, subsequent encounter for closed fracture with routine healing: Secondary | ICD-10-CM | POA: Diagnosis not present

## 2020-11-23 DIAGNOSIS — I10 Essential (primary) hypertension: Secondary | ICD-10-CM | POA: Diagnosis not present

## 2020-11-23 DIAGNOSIS — S62101D Fracture of unspecified carpal bone, right wrist, subsequent encounter for fracture with routine healing: Secondary | ICD-10-CM | POA: Diagnosis not present

## 2020-11-23 DIAGNOSIS — R7303 Prediabetes: Secondary | ICD-10-CM | POA: Diagnosis not present

## 2020-11-23 DIAGNOSIS — D6851 Activated protein C resistance: Secondary | ICD-10-CM | POA: Diagnosis not present

## 2020-11-24 DIAGNOSIS — H2511 Age-related nuclear cataract, right eye: Secondary | ICD-10-CM | POA: Diagnosis not present

## 2020-11-30 DIAGNOSIS — H2511 Age-related nuclear cataract, right eye: Secondary | ICD-10-CM | POA: Diagnosis not present

## 2020-11-30 DIAGNOSIS — I10 Essential (primary) hypertension: Secondary | ICD-10-CM | POA: Diagnosis not present

## 2020-12-01 DIAGNOSIS — I10 Essential (primary) hypertension: Secondary | ICD-10-CM | POA: Diagnosis not present

## 2020-12-01 DIAGNOSIS — D6851 Activated protein C resistance: Secondary | ICD-10-CM | POA: Diagnosis not present

## 2020-12-01 DIAGNOSIS — R7303 Prediabetes: Secondary | ICD-10-CM | POA: Diagnosis not present

## 2020-12-01 DIAGNOSIS — S62101D Fracture of unspecified carpal bone, right wrist, subsequent encounter for fracture with routine healing: Secondary | ICD-10-CM | POA: Diagnosis not present

## 2020-12-01 DIAGNOSIS — I69351 Hemiplegia and hemiparesis following cerebral infarction affecting right dominant side: Secondary | ICD-10-CM | POA: Diagnosis not present

## 2020-12-01 DIAGNOSIS — S52551D Other extraarticular fracture of lower end of right radius, subsequent encounter for closed fracture with routine healing: Secondary | ICD-10-CM | POA: Diagnosis not present

## 2020-12-04 DIAGNOSIS — Z7901 Long term (current) use of anticoagulants: Secondary | ICD-10-CM | POA: Diagnosis not present

## 2020-12-05 DIAGNOSIS — S62101D Fracture of unspecified carpal bone, right wrist, subsequent encounter for fracture with routine healing: Secondary | ICD-10-CM | POA: Diagnosis not present

## 2020-12-05 DIAGNOSIS — I10 Essential (primary) hypertension: Secondary | ICD-10-CM | POA: Diagnosis not present

## 2020-12-05 DIAGNOSIS — D6851 Activated protein C resistance: Secondary | ICD-10-CM | POA: Diagnosis not present

## 2020-12-05 DIAGNOSIS — I69351 Hemiplegia and hemiparesis following cerebral infarction affecting right dominant side: Secondary | ICD-10-CM | POA: Diagnosis not present

## 2020-12-05 DIAGNOSIS — R7303 Prediabetes: Secondary | ICD-10-CM | POA: Diagnosis not present

## 2020-12-05 DIAGNOSIS — S52551D Other extraarticular fracture of lower end of right radius, subsequent encounter for closed fracture with routine healing: Secondary | ICD-10-CM | POA: Diagnosis not present

## 2020-12-07 ENCOUNTER — Encounter: Payer: Self-pay | Admitting: Ophthalmology

## 2020-12-07 ENCOUNTER — Other Ambulatory Visit: Payer: Self-pay

## 2020-12-08 ENCOUNTER — Other Ambulatory Visit
Admission: RE | Admit: 2020-12-08 | Discharge: 2020-12-08 | Disposition: A | Discharge status: Home / Self Care | Payer: Medicare Other | Source: Ambulatory Visit | Attending: Ophthalmology | Admitting: Ophthalmology

## 2020-12-08 DIAGNOSIS — Z20822 Contact with and (suspected) exposure to covid-19: Secondary | ICD-10-CM | POA: Insufficient documentation

## 2020-12-08 DIAGNOSIS — Z01812 Encounter for preprocedural laboratory examination: Secondary | ICD-10-CM | POA: Diagnosis not present

## 2020-12-08 LAB — SARS CORONAVIRUS 2 (TAT 6-24 HRS): SARS Coronavirus 2: NEGATIVE

## 2020-12-11 NOTE — Discharge Instructions (Signed)

## 2020-12-12 ENCOUNTER — Encounter: Payer: Self-pay | Admitting: Ophthalmology

## 2020-12-12 ENCOUNTER — Ambulatory Visit
Admission: RE | Admit: 2020-12-12 | Discharge: 2020-12-12 | Disposition: A | Discharge status: Home / Self Care | Payer: Medicare Other | Attending: Ophthalmology | Admitting: Ophthalmology

## 2020-12-12 ENCOUNTER — Encounter
Admission: RE | Disposition: A | Discharge status: Home / Self Care | Payer: Self-pay | Source: Home / Self Care | Attending: Ophthalmology

## 2020-12-12 ENCOUNTER — Ambulatory Visit: Payer: Medicare Other | Admitting: Anesthesiology

## 2020-12-12 ENCOUNTER — Other Ambulatory Visit: Payer: Self-pay

## 2020-12-12 DIAGNOSIS — Z9842 Cataract extraction status, left eye: Secondary | ICD-10-CM | POA: Insufficient documentation

## 2020-12-12 DIAGNOSIS — Z79899 Other long term (current) drug therapy: Secondary | ICD-10-CM | POA: Diagnosis not present

## 2020-12-12 DIAGNOSIS — Z885 Allergy status to narcotic agent status: Secondary | ICD-10-CM | POA: Diagnosis not present

## 2020-12-12 DIAGNOSIS — H2511 Age-related nuclear cataract, right eye: Secondary | ICD-10-CM | POA: Diagnosis not present

## 2020-12-12 DIAGNOSIS — Z961 Presence of intraocular lens: Secondary | ICD-10-CM | POA: Diagnosis not present

## 2020-12-12 DIAGNOSIS — Z7901 Long term (current) use of anticoagulants: Secondary | ICD-10-CM | POA: Diagnosis not present

## 2020-12-12 DIAGNOSIS — E1136 Type 2 diabetes mellitus with diabetic cataract: Secondary | ICD-10-CM | POA: Insufficient documentation

## 2020-12-12 DIAGNOSIS — H25811 Combined forms of age-related cataract, right eye: Secondary | ICD-10-CM | POA: Diagnosis not present

## 2020-12-12 DIAGNOSIS — Z87891 Personal history of nicotine dependence: Secondary | ICD-10-CM | POA: Diagnosis not present

## 2020-12-12 DIAGNOSIS — Z96642 Presence of left artificial hip joint: Secondary | ICD-10-CM | POA: Diagnosis not present

## 2020-12-12 HISTORY — PX: CATARACT EXTRACTION W/PHACO: SHX586

## 2020-12-12 LAB — GLUCOSE, CAPILLARY: Glucose-Capillary: 118 mg/dL — ABNORMAL HIGH (ref 70–99)

## 2020-12-12 SURGERY — PHACOEMULSIFICATION, CATARACT, WITH IOL INSERTION
Anesthesia: Monitor Anesthesia Care | Site: Eye | Laterality: Right

## 2020-12-12 MED ORDER — MIDAZOLAM HCL 2 MG/2ML IJ SOLN
INTRAMUSCULAR | Status: DC | PRN
  Administered 2020-12-12: 1 mg via INTRAVENOUS

## 2020-12-12 MED ORDER — LACTATED RINGERS IV SOLN: INTRAVENOUS | Status: DC

## 2020-12-12 MED ORDER — NA CHONDROIT SULF-NA HYALURON 40-17 MG/ML IO SOLN
INTRAOCULAR | Status: DC | PRN
  Administered 2020-12-12: 1 mL via INTRAOCULAR

## 2020-12-12 MED ORDER — FENTANYL CITRATE (PF) 100 MCG/2ML IJ SOLN
INTRAMUSCULAR | Status: DC | PRN
  Administered 2020-12-12: 50 ug via INTRAVENOUS

## 2020-12-12 MED ORDER — ARMC OPHTHALMIC DILATING DROPS
1.0000 "application " | OPHTHALMIC | Status: DC | PRN
  Administered 2020-12-12 (×3): 1 via OPHTHALMIC

## 2020-12-12 MED ORDER — LIDOCAINE HCL (PF) 2 % IJ SOLN
INTRAOCULAR | Status: DC | PRN
  Administered 2020-12-12: 1 mL

## 2020-12-12 MED ORDER — BRIMONIDINE TARTRATE-TIMOLOL 0.2-0.5 % OP SOLN
OPHTHALMIC | Status: DC | PRN
  Administered 2020-12-12: 1 [drp] via OPHTHALMIC

## 2020-12-12 MED ORDER — MOXIFLOXACIN HCL 0.5 % OP SOLN
OPHTHALMIC | Status: DC | PRN
  Administered 2020-12-12: 0.2 mL via OPHTHALMIC

## 2020-12-12 MED ORDER — EPINEPHRINE PF 1 MG/ML IJ SOLN: INTRAOCULAR | Status: DC | PRN

## 2020-12-12 MED ORDER — ACETAMINOPHEN 160 MG/5ML PO SOLN: 325.0000 mg | Freq: Once | ORAL | Status: DC

## 2020-12-12 MED ORDER — ACETAMINOPHEN 325 MG PO TABS: 325.0000 mg | ORAL_TABLET | Freq: Once | ORAL | Status: DC

## 2020-12-12 MED ORDER — TETRACAINE HCL 0.5 % OP SOLN
1.0000 [drp] | OPHTHALMIC | Status: DC | PRN
  Administered 2020-12-12 (×3): 1 [drp] via OPHTHALMIC

## 2020-12-12 SURGICAL SUPPLY — 23 items
CANNULA ANT/CHMB 27G (MISCELLANEOUS) ×2 IMPLANT
CANNULA ANT/CHMB 27GA (MISCELLANEOUS) ×4 IMPLANT
GLOVE SURG LX 8.0 MICRO (GLOVE) ×2
GLOVE SURG LX STRL 8.0 MICRO (GLOVE) ×1 IMPLANT
GLOVE SURG TRIUMPH 8.0 PF LTX (GLOVE) ×2 IMPLANT
GOWN STRL REUS W/ TWL LRG LVL3 (GOWN DISPOSABLE) ×2 IMPLANT
GOWN STRL REUS W/TWL LRG LVL3 (GOWN DISPOSABLE) ×4
LENS IOL ACRSF IQ TRC 4 18.5 IMPLANT
LENS IOL ACRYSOF IQ TORIC 18.5 ×2 IMPLANT
LENS IOL IQ TORIC 4 18.5 ×1 IMPLANT
MARKER SKIN DUAL TIP RULER LAB (MISCELLANEOUS) ×2 IMPLANT
NDL FILTER BLUNT 18X1 1/2 (NEEDLE) ×1 IMPLANT
NEEDLE FILTER BLUNT 18X 1/2SAF (NEEDLE) ×1
NEEDLE FILTER BLUNT 18X1 1/2 (NEEDLE) ×1 IMPLANT
PACK EYE AFTER SURG (MISCELLANEOUS) ×2 IMPLANT
PACK OPTHALMIC (MISCELLANEOUS) ×2 IMPLANT
PACK PORFILIO (MISCELLANEOUS) ×2 IMPLANT
SUT ETHILON 10-0 CS-B-6CS-B-6 (SUTURE)
SUTURE EHLN 10-0 CS-B-6CS-B-6 (SUTURE) IMPLANT
SYR 3ML LL SCALE MARK (SYRINGE) ×2 IMPLANT
SYR TB 1ML LUER SLIP (SYRINGE) ×2 IMPLANT
WATER STERILE IRR 250ML POUR (IV SOLUTION) ×2 IMPLANT
WIPE NON LINTING 3.25X3.25 (MISCELLANEOUS) ×2 IMPLANT

## 2020-12-12 NOTE — Anesthesia Postprocedure Evaluation (Signed)
Anesthesia Post Note  Patient: MARIAN MENEELY  Procedure(s) Performed: CATARACT EXTRACTION PHACO AND INTRAOCULAR LENS PLACEMENT (IOC) RIGHT TORIC LENS (Right Eye)     Patient location during evaluation: PACU Anesthesia Type: MAC Level of consciousness: awake and alert and oriented Pain management: satisfactory to patient Vital Signs Assessment: post-procedure vital signs reviewed and stable Respiratory status: spontaneous breathing, nonlabored ventilation and respiratory function stable Cardiovascular status: blood pressure returned to baseline and stable Postop Assessment: Adequate PO intake and No signs of nausea or vomiting Anesthetic complications: no   No complications documented.  Raliegh Ip

## 2020-12-12 NOTE — Anesthesia Preprocedure Evaluation (Signed)
Anesthesia Evaluation  Patient identified by MRN, date of birth, ID band Patient awake    Reviewed: Allergy & Precautions, H&P , NPO status , Patient's Chart, lab work & pertinent test results  Airway Mallampati: II  TM Distance: >3 FB Neck ROM: full    Dental no notable dental hx.    Pulmonary former smoker,    Pulmonary exam normal breath sounds clear to auscultation       Cardiovascular hypertension, Normal cardiovascular exam Rhythm:regular Rate:Normal     Neuro/Psych CVA    GI/Hepatic   Endo/Other  diabetes  Renal/GU      Musculoskeletal   Abdominal   Peds  Hematology   Anesthesia Other Findings   Reproductive/Obstetrics                             Anesthesia Physical Anesthesia Plan  ASA: III  Anesthesia Plan: MAC   Post-op Pain Management:    Induction:   PONV Risk Score and Plan: 2 and Treatment may vary due to age or medical condition, Midazolam and TIVA  Airway Management Planned:   Additional Equipment:   Intra-op Plan:   Post-operative Plan:   Informed Consent: I have reviewed the patients History and Physical, chart, labs and discussed the procedure including the risks, benefits and alternatives for the proposed anesthesia with the patient or authorized representative who has indicated his/her understanding and acceptance.     Dental Advisory Given  Plan Discussed with: CRNA  Anesthesia Plan Comments:         Anesthesia Quick Evaluation

## 2020-12-12 NOTE — Transfer of Care (Signed)
Immediate Anesthesia Transfer of Care Note  Patient: [name]  Procedure(s) Performed: CATARACT EXTRACTION PHACO AND INTRAOCULAR LENS PLACEMENT (IOC) RIGHT TORIC LENS (Right Eye)  Patient Location: PACU  Anesthesia Type: MAC  Level of Consciousness: awake, alert  and patient cooperative  Airway and Oxygen Therapy: Patient Spontanous Breathing and Patient connected to supplemental oxygen  Post-op Assessment: Post-op Vital signs reviewed, Patient's Cardiovascular Status Stable, Respiratory Function Stable, Patent Airway and No signs of Nausea or vomiting  Post-op Vital Signs: Reviewed and stable  Complications: No complications documented.

## 2020-12-12 NOTE — Op Note (Signed)
PREOPERATIVE DIAGNOSIS:  Nuclear sclerotic cataract of the right eye.   POSTOPERATIVE DIAGNOSIS:  Nuclear sclerotic cataract of the right eye.   OPERATIVE PROCEDURE: Procedure(s): CATARACT EXTRACTION PHACO AND INTRAOCULAR LENS PLACEMENT (IOC) RIGHT TORIC LENS   SURGEON:  [name], MD.   ANESTHESIA: 1.      Managed anesthesia care. 2.     0.63ml of Shugarcaine was instilled following the paracentesis  Anesthesiologist: Ronelle Nigh, MD CRNA: Silvana Newness, CRNA  COMPLICATIONS:  None.   TECHNIQUE:   Stop and chop    DESCRIPTION OF PROCEDURE:  The patient was examined and consented in the preoperative holding area where the aforementioned topical anesthesia was applied to the right eye.  The patient was brought back to the Operating Room where he was sat upright on the gurney and given a target to fixate upon while the eye was marked at the 3:00 and 9:00 position.  The patient was then reclined on the operating table.  The eye was prepped and draped in the usual sterile ophthalmic fashion and a lid speculum was placed. A paracentesis was created with the side port blade and the anterior chamber was filled with viscoelastic. A near clear corneal incision was performed with the steel keratome. A continuous curvilinear capsulorrhexis was performed with a cystotome followed by the capsulorrhexis forceps. Hydrodissection and hydrodelineation were carried out with BSS on a blunt cannula. The lens was removed in a stop and chop technique and the remaining cortical material was removed with the irrigation-aspiration handpiece. The eye was inflated with viscoelastic and the [id number]  lens  was placed in the eye and rotated to within a few degrees of the predetermined orientation.  The remaining viscoelastic was removed from the eye.  The Sinskey hook was used to rotate the toric lens into its final resting place at 108 degrees.  0. The eye was inflated to a physiologic pressure and found to be  watertight. 0.74ml of Vigamox was placed in the anterior chamber.  The eye was dressed with Vigamox. The patient was given protective glasses to wear throughout the day and a shield with which to sleep tonight. The patient was also given drops with which to begin a drop regimen today and will follow-up with me in one day. Implant Name Type Inv. Item Serial No. Manufacturer Lot No. LRB No. Used Action  LENS IOL ACRYSOF IQ TORIC 18.5 - [id number]  LENS IOL ACRYSOF IQ TORIC 18.5 [id number]  Right 1 Implanted   Procedure(s) with comments: CATARACT EXTRACTION PHACO AND INTRAOCULAR LENS PLACEMENT (IOC) RIGHT TORIC LENS (Right) - 6.55 0:41.1  Electronically signed: [name] 12/12/2020 11:50 AM

## 2020-12-12 NOTE — H&P (Signed)
[facility]   Primary Care Physician:  Crist Infante, MD Ophthalmologist: Dr. George Ina  Pre-Procedure History & Physical: HPI:  [name] is a 75 y.o. female here for cataract surgery.   Past Medical History:  Diagnosis Date  . Allergic rhinitis   . 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 Diabetes mellitus without complication ([place])    no meds-diet controlled  . Factor 5 Leiden mutation, heterozygous ([place])   . Family history of adverse reaction to anesthesia    Daughter was "paralyzed" for several days after 1 surgery.  Marland Kitchen Heart murmur    mild mitral regurg-echo 2010  . Hyperlipidemia   . Hypertension   . Lupus anticoagulant positive   . Obesity   . Pre-diabetes   . Primary localized osteoarthritis of left hip 03/30/2019  . Stroke [facility]) 2010   Some right sided weakness.  Gait issues.  . Wears dentures    partial upper    Past Surgical History:  Procedure Laterality Date  . ABDOMINAL HYSTERECTOMY  1981  . APPENDECTOMY    . AXILLARY LYMPH NODE BIOPSY Left 05/16/2014   Procedure: EXCISION 2CM LEFT AXILLARY MASS;  Surgeon: [name], MD;  Location: [facility];  Service: General;  Laterality: Left;  Left axillary sebaceous cyst excision  . CATARACT EXTRACTION W/PHACO Left 04/11/2020   Procedure: CATARACT EXTRACTION PHACO AND INTRAOCULAR LENS PLACEMENT (IOC) LEFT DIABETIC TORIC LENS  6.05 00:34.9;  Surgeon: [name], MD;  Location: [facility];  Service: Ophthalmology;  Laterality: Left;  . DILATION AND CURETTAGE OF UTERUS    . SKIN CANCER EXCISION  2016   [place]  . 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   Procedure: TOTAL HIP ARTHROPLASTY;  Surgeon: [name], MD;  Location: [facility];  Service: Orthopedics;  Laterality: Left;    Prior to Admission medications   Medication Sig Start Date End Date Taking? Authorizing Provider  acetaminophen (TYLENOL) 650 MG CR tablet Take 1,300 mg by mouth every 8 (eight) hours.   Yes [provider]  amLODipine (NORVASC) 5 MG tablet Take 5 mg by mouth 2 (two) times daily.    Yes [provider]  B Complex Vitamins (B COMPLEX PO) Take 1 tablet by mouth daily.    Yes [provider]  BIOTIN PO Take 1 tablet by mouth daily.    Yes [provider]  cholecalciferol (VITAMIN D) 1000 UNITS tablet Take 1,000 Units by mouth daily.    Yes [provider]  escitalopram (LEXAPRO) 10 MG tablet Take 10 mg by mouth daily. 11/25/14  Yes [provider]  ezetimibe (ZETIA) 10 MG tablet Take 5 mg by mouth 3 (three) times a week.    Yes [provider]  fexofenadine (ALLEGRA) 60 MG tablet Take 60 mg by mouth 2 (two) times daily as needed for allergies or rhinitis.   Yes [provider]  fluticasone (FLONASE) 50 MCG/ACT nasal spray Place 2 sprays into both nostrils daily as needed for allergies.  07/24/17  Yes [provider]  lisinopril (PRINIVIL,ZESTRIL) 10 MG tablet Take 10 mg by mouth 2 (two) times daily.  08/22/17  Yes [provider]  Magnesium 250 MG TABS Take 250 mg by mouth daily.    Yes [provider]  simvastatin (ZOCOR) 20 MG tablet Take 20 mg by mouth at bedtime.    Yes [provider]  vitamin B-12 (CYANOCOBALAMIN) 1000 MCG tablet Take 1,000 mcg by mouth daily.  Yes [provider]  warfarin (COUMADIN) 5 MG tablet Take 5 mg by mouth daily. Take 5 mg by mouth daily   Yes [provider]  amoxicillin (AMOXIL) 500 MG capsule Take 1 capsule (500 mg total) by mouth 3 (three) times daily. Patient not taking: Reported on 04/05/2020 02/17/20   Gae Dry, MD  diclofenac sodium (VOLTAREN) 1 % GEL Apply 1 application topically 3 (three) times daily as needed (pain). Patient not taking: Reported on 12/12/2020    [provider]    Allergies as of 11/30/2020 - Review Complete 04/11/2020  Allergen Reaction Noted  . Codeine  03/14/2014    Family History  Problem  Relation Age of Onset  . Heart disease Mother   . COPD Father   . Colon cancer Sister   . Clotting disorder Sister   . Diabetes Sister   . Kidney disease Sister   . Heart disease Brother     Social History   Socioeconomic History  . Marital status: Divorced    Spouse name: Not on file  . Number of children: 2  . Years of education: Not on file  . Highest education level: Not on file  Occupational History  . Occupation: Retired  Tobacco Use  . Smoking status: Former Smoker    Types: Cigarettes    Quit date: 03/14/2009    Years since quitting: 11.7  . Smokeless tobacco: Never Used  Vaping Use  . Vaping Use: Never used  Substance and Sexual Activity  . Alcohol use: No    Alcohol/week: 0.0 standard drinks  . Drug use: No  . Sexual activity: Not on file  Other Topics Concern  . Not on file  Social History Narrative  . Not on file   Social Determinants of Health   Financial Resource Strain: Not on file  Food Insecurity: Not on file  Transportation Needs: Not on file  Physical Activity: Not on file  Stress: Not on file  Social Connections: Not on file  Intimate Partner Violence: Not on file    Review of Systems: See HPI, otherwise negative ROS  Physical Exam: BP (!) 149/82   Pulse 83   Temp 98.7 F (37.1 C) (Temporal)   Resp 18   Ht 5\' 4"  (1.626 m)   Wt 82.6 kg   SpO2 98%   BMI 31.24 kg/m  General:   Alert,  pleasant and cooperative in NAD Head:  Normocephalic and atraumatic. Respiratory:  Normal work of breathing.  Impression/Plan: [name] is here for cataract surgery.  Risks, benefits, limitations, and alternatives regarding cataract surgery have been reviewed with the patient.  Questions have been answered.  All parties agreeable.   [name], MD  12/12/2020, 11:11 AM

## 2020-12-12 NOTE — Anesthesia Procedure Notes (Signed)
Procedure Name: MAC Date/Time: 12/12/2020 11:26 AM Performed by: Silvana Newness, CRNA Pre-anesthesia Checklist: Patient identified, Emergency Drugs available, Suction available, Patient being monitored and Timeout performed Patient Re-evaluated:Patient Re-evaluated prior to induction Oxygen Delivery Method: Nasal cannula Placement Confirmation: positive ETCO2

## 2020-12-13 ENCOUNTER — Encounter: Payer: Self-pay | Admitting: Ophthalmology

## 2020-12-27 DIAGNOSIS — R7301 Impaired fasting glucose: Secondary | ICD-10-CM | POA: Diagnosis not present

## 2020-12-27 DIAGNOSIS — D689 Coagulation defect, unspecified: Secondary | ICD-10-CM | POA: Diagnosis not present

## 2020-12-27 DIAGNOSIS — M8580 Other specified disorders of bone density and structure, unspecified site: Secondary | ICD-10-CM | POA: Diagnosis not present

## 2020-12-27 DIAGNOSIS — I69959 Hemiplegia and hemiparesis following unspecified cerebrovascular disease affecting unspecified side: Secondary | ICD-10-CM | POA: Diagnosis not present

## 2020-12-27 DIAGNOSIS — F329 Major depressive disorder, single episode, unspecified: Secondary | ICD-10-CM | POA: Diagnosis not present

## 2020-12-27 DIAGNOSIS — E785 Hyperlipidemia, unspecified: Secondary | ICD-10-CM | POA: Diagnosis not present

## 2020-12-27 DIAGNOSIS — Z7901 Long term (current) use of anticoagulants: Secondary | ICD-10-CM | POA: Diagnosis not present

## 2021-01-22 DIAGNOSIS — Z23 Encounter for immunization: Secondary | ICD-10-CM | POA: Diagnosis not present

## 2021-01-30 DIAGNOSIS — Z7901 Long term (current) use of anticoagulants: Secondary | ICD-10-CM | POA: Diagnosis not present

## 2021-01-30 DIAGNOSIS — R531 Weakness: Secondary | ICD-10-CM | POA: Diagnosis not present

## 2021-01-30 DIAGNOSIS — I69998 Other sequelae following unspecified cerebrovascular disease: Secondary | ICD-10-CM | POA: Diagnosis not present

## 2021-01-30 DIAGNOSIS — M21371 Foot drop, right foot: Secondary | ICD-10-CM | POA: Diagnosis not present

## 2021-01-30 DIAGNOSIS — M8589 Other specified disorders of bone density and structure, multiple sites: Secondary | ICD-10-CM | POA: Diagnosis not present

## 2021-02-15 DIAGNOSIS — D1801 Hemangioma of skin and subcutaneous tissue: Secondary | ICD-10-CM | POA: Diagnosis not present

## 2021-02-15 DIAGNOSIS — L821 Other seborrheic keratosis: Secondary | ICD-10-CM | POA: Diagnosis not present

## 2021-02-15 DIAGNOSIS — D2371 Other benign neoplasm of skin of right lower limb, including hip: Secondary | ICD-10-CM | POA: Diagnosis not present

## 2021-02-15 DIAGNOSIS — L814 Other melanin hyperpigmentation: Secondary | ICD-10-CM | POA: Diagnosis not present

## 2021-02-15 DIAGNOSIS — L700 Acne vulgaris: Secondary | ICD-10-CM | POA: Diagnosis not present

## 2021-02-15 DIAGNOSIS — Z85828 Personal history of other malignant neoplasm of skin: Secondary | ICD-10-CM | POA: Diagnosis not present

## 2021-02-15 DIAGNOSIS — D224 Melanocytic nevi of scalp and neck: Secondary | ICD-10-CM | POA: Diagnosis not present

## 2021-02-15 DIAGNOSIS — D2262 Melanocytic nevi of left upper limb, including shoulder: Secondary | ICD-10-CM | POA: Diagnosis not present

## 2021-02-15 DIAGNOSIS — D220 Melanocytic nevi of lip: Secondary | ICD-10-CM | POA: Diagnosis not present

## 2021-02-15 DIAGNOSIS — Z7901 Long term (current) use of anticoagulants: Secondary | ICD-10-CM | POA: Diagnosis not present

## 2021-02-15 DIAGNOSIS — L720 Epidermal cyst: Secondary | ICD-10-CM | POA: Diagnosis not present

## 2021-02-27 ENCOUNTER — Emergency Department
Admission: EM | Admit: 2021-02-27 | Discharge: 2021-02-27 | Disposition: A | Discharge status: Home / Self Care | Payer: Medicare Other | Attending: Emergency Medicine | Admitting: Emergency Medicine

## 2021-02-27 ENCOUNTER — Emergency Department: Payer: Medicare Other

## 2021-02-27 ENCOUNTER — Other Ambulatory Visit: Payer: Self-pay

## 2021-02-27 DIAGNOSIS — Z7901 Long term (current) use of anticoagulants: Secondary | ICD-10-CM | POA: Insufficient documentation

## 2021-02-27 DIAGNOSIS — Z8673 Personal history of transient ischemic attack (TIA), and cerebral infarction without residual deficits: Secondary | ICD-10-CM | POA: Insufficient documentation

## 2021-02-27 DIAGNOSIS — R519 Headache, unspecified: Secondary | ICD-10-CM | POA: Diagnosis not present

## 2021-02-27 DIAGNOSIS — R001 Bradycardia, unspecified: Secondary | ICD-10-CM | POA: Diagnosis not present

## 2021-02-27 DIAGNOSIS — E1169 Type 2 diabetes mellitus with other specified complication: Secondary | ICD-10-CM | POA: Diagnosis not present

## 2021-02-27 DIAGNOSIS — Z79899 Other long term (current) drug therapy: Secondary | ICD-10-CM | POA: Insufficient documentation

## 2021-02-27 DIAGNOSIS — W19XXXA Unspecified fall, initial encounter: Secondary | ICD-10-CM | POA: Diagnosis not present

## 2021-02-27 DIAGNOSIS — Y92009 Unspecified place in unspecified non-institutional (private) residence as the place of occurrence of the external cause: Secondary | ICD-10-CM | POA: Insufficient documentation

## 2021-02-27 DIAGNOSIS — Z96642 Presence of left artificial hip joint: Secondary | ICD-10-CM | POA: Diagnosis not present

## 2021-02-27 DIAGNOSIS — I10 Essential (primary) hypertension: Secondary | ICD-10-CM | POA: Insufficient documentation

## 2021-02-27 DIAGNOSIS — R0902 Hypoxemia: Secondary | ICD-10-CM | POA: Diagnosis not present

## 2021-02-27 DIAGNOSIS — S0990XA Unspecified injury of head, initial encounter: Secondary | ICD-10-CM | POA: Diagnosis not present

## 2021-02-27 DIAGNOSIS — E785 Hyperlipidemia, unspecified: Secondary | ICD-10-CM | POA: Diagnosis not present

## 2021-02-27 DIAGNOSIS — Z23 Encounter for immunization: Secondary | ICD-10-CM | POA: Diagnosis not present

## 2021-02-27 DIAGNOSIS — Z87891 Personal history of nicotine dependence: Secondary | ICD-10-CM | POA: Insufficient documentation

## 2021-02-27 DIAGNOSIS — W050XXA Fall from non-moving wheelchair, initial encounter: Secondary | ICD-10-CM | POA: Insufficient documentation

## 2021-02-27 DIAGNOSIS — M542 Cervicalgia: Secondary | ICD-10-CM | POA: Diagnosis not present

## 2021-02-27 LAB — BASIC METABOLIC PANEL
Anion gap: 10 (ref 5–15)
BUN: 16 mg/dL (ref 8–23)
CO2: 23 mmol/L (ref 22–32)
Calcium: 9.1 mg/dL (ref 8.9–10.3)
Chloride: 107 mmol/L (ref 98–111)
Creatinine, Ser: 0.79 mg/dL (ref 0.44–1.00)
GFR, Estimated: >60 mL/min (ref >60)
Glucose, Bld: 112 mg/dL — ABNORMAL HIGH (ref 70–99)
Potassium: 3.8 mmol/L (ref 3.5–5.1)
Sodium: 140 mmol/L (ref 135–145)

## 2021-02-27 LAB — CBC WITH DIFFERENTIAL/PLATELET
Abs Immature Granulocytes: 0.07 10*3/uL (ref 0.00–0.07)
Basophils Absolute: 0 10*3/uL (ref 0.0–0.1)
Basophils Relative: 0 %
Eosinophils Absolute: 0.1 10*3/uL (ref 0.0–0.5)
Eosinophils Relative: 1 %
HCT: 43.1 % (ref 36.0–46.0)
Hemoglobin: 14.2 g/dL (ref 12.0–15.0)
Immature Granulocytes: 1 %
Lymphocytes Relative: 23 %
Lymphs Abs: 2.3 10*3/uL (ref 0.7–4.0)
MCH: 31.6 pg (ref 26.0–34.0)
MCHC: 32.9 g/dL (ref 30.0–36.0)
MCV: 96 fL (ref 80.0–100.0)
Monocytes Absolute: 0.8 10*3/uL (ref 0.1–1.0)
Monocytes Relative: 8 %
Neutro Abs: 6.8 10*3/uL (ref 1.7–7.7)
Neutrophils Relative %: 67 %
Platelets: 225 10*3/uL (ref 150–400)
RBC: 4.49 MIL/uL (ref 3.87–5.11)
RDW: 12.8 % (ref 11.5–15.5)
WBC: 10.1 10*3/uL (ref 4.0–10.5)
nRBC: 0 % (ref 0.0–0.2)

## 2021-02-27 LAB — PROTIME-INR
INR: 2.2 — ABNORMAL HIGH (ref 0.8–1.2)
Prothrombin Time: 24 seconds — ABNORMAL HIGH (ref 11.4–15.2)

## 2021-02-27 LAB — APTT: aPTT: 34 seconds (ref 24–36)

## 2021-02-27 MED ORDER — TETANUS-DIPHTH-ACELL PERTUSSIS 5-2.5-18.5 LF-MCG/0.5 IM SUSY
0.5000 mL | PREFILLED_SYRINGE | Freq: Once | INTRAMUSCULAR | Status: AC
  Administered 2021-02-27: 0.5 mL via INTRAMUSCULAR
  Filled 2021-02-27: qty 0.5

## 2021-02-27 NOTE — ED Provider Notes (Signed)
[facility] Emergency Department Provider Note  ____________________________________________   Event Date/Time   First MD Initiated Contact with Patient 02/27/21 1659     (approximate)  I have reviewed the triage vital signs and the nursing notes.   HISTORY  Chief Complaint Fall    HPI [name] is a 75 y.o. female with diabetes, stroke with some right-sided weakness and right foot drop who is on warfarin who comes in for a fall.  Patient reports mechanical fall due to her right foot not going as she wanted when she was trying to ambulate with her walker.  She reports falling down on her back and her head hitting the dishwasher.  Patient does have a hematoma there.  Patient does report some mild pain there constant, sharp.  Denies any pain or tenderness anywhere else on her chest abdomen pelvis, back.  No pain in any of her extremities.  Denies any shortness of breath, chest pain, loss of consciousness.          Past Medical History:  Diagnosis Date  . Allergic rhinitis   . 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 Diabetes mellitus without complication ([place])    no meds-diet controlled  . Factor 5 Leiden mutation, heterozygous ([place])   . Family history of adverse reaction to anesthesia    Daughter was "paralyzed" for several days after 1 surgery.  Marland Kitchen Heart murmur    mild mitral regurg-echo 2010  . Hyperlipidemia   . Hypertension   . Lupus anticoagulant positive   . Obesity   . Pre-diabetes   . Primary localized osteoarthritis of left hip 03/30/2019  . Stroke [facility]) 2010   Some right sided weakness.  Gait issues.  . Wears dentures    partial upper    Patient Active Problem List   Diagnosis Date Noted  . Primary localized osteoarthritis of left hip 03/30/2019  . Status post left hip replacement 03/30/2019    Past Surgical History:  Procedure Laterality Date  . ABDOMINAL HYSTERECTOMY  1981  . APPENDECTOMY    . AXILLARY LYMPH NODE BIOPSY  Left 05/16/2014   Procedure: EXCISION 2CM LEFT AXILLARY MASS;  Surgeon: [name], MD;  Location: [place];  Service: General;  Laterality: Left;  Left axillary sebaceous cyst excision  . CATARACT EXTRACTION W/PHACO Left 04/11/2020   Procedure: CATARACT EXTRACTION PHACO AND INTRAOCULAR LENS PLACEMENT (IOC) LEFT DIABETIC TORIC LENS  6.05 00:34.9;  Surgeon: [name], MD;  Location: [place];  Service: Ophthalmology;  Laterality: Left;  . CATARACT EXTRACTION W/PHACO Right 12/12/2020   Procedure: CATARACT EXTRACTION PHACO AND INTRAOCULAR LENS PLACEMENT ([place]) RIGHT TORIC LENS;  Surgeon: [name], MD;  Location: [place];  Service: Ophthalmology;  Laterality: Right;  6.55 0:41.1  . DILATION AND CURETTAGE OF UTERUS    . SKIN CANCER EXCISION  2016   [place]  . 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   Procedure: TOTAL HIP ARTHROPLASTY;  Surgeon: [name], MD;  Location: [facility];  Service: Orthopedics;  Laterality: Left;    Prior to Admission medications   Medication Sig Start Date End Date Taking? Authorizing Provider  acetaminophen (TYLENOL) 650 MG CR tablet Take 1,300 mg by mouth every 8 (eight) hours.    [provider]  amLODipine (NORVASC) 5 MG tablet Take 5 mg by mouth 2 (two) times daily.     [provider]  amoxicillin (AMOXIL) 500 MG capsule Take 1 capsule (500 mg total)  by mouth 3 (three) times daily. Patient not taking: Reported on 04/05/2020 02/17/20   Gae Dry, MD  B Complex Vitamins (B COMPLEX PO) Take 1 tablet by mouth daily.     [provider]  BIOTIN PO Take 1 tablet by mouth daily.     [provider]  cholecalciferol (VITAMIN D) 1000 UNITS tablet Take 1,000 Units by mouth daily.     [provider]  diclofenac sodium (VOLTAREN) 1 % GEL Apply 1 application topically 3 (three) times daily as needed (pain). Patient not taking: Reported on 12/12/2020    [provider]  escitalopram (LEXAPRO) 10 MG tablet Take 10 mg by mouth daily. 11/25/14   [provider]  ezetimibe (ZETIA) 10 MG tablet Take 5 mg by mouth 3 (three) times a week.     [provider]  fexofenadine (ALLEGRA) 60 MG tablet Take 60 mg by mouth 2 (two) times daily as needed for allergies or rhinitis.    [provider]  fluticasone (FLONASE) 50 MCG/ACT nasal spray Place 2 sprays into both nostrils daily as needed for allergies.  07/24/17   [provider]  lisinopril (PRINIVIL,ZESTRIL) 10 MG tablet Take 10 mg by mouth 2 (two) times daily.  08/22/17   [provider]  Magnesium 250 MG TABS Take 250 mg by mouth daily.     [provider]  simvastatin (ZOCOR) 20 MG tablet Take 20 mg by mouth at bedtime.     [provider]  vitamin B-12 (CYANOCOBALAMIN) 1000 MCG tablet Take 1,000 mcg by mouth daily.    [provider]  warfarin (COUMADIN) 5 MG tablet Take 5 mg by mouth daily. Take 5 mg by mouth daily    [provider]    Allergies Codeine  Family History  Problem Relation Age of Onset  . Heart disease Mother   . COPD Father   . Colon cancer Sister   . Clotting disorder Sister   . Diabetes Sister   . Kidney disease Sister   . Heart disease Brother     Social History Social History   Tobacco Use  . Smoking status: Former Smoker    Types: Cigarettes    Quit date: 03/14/2009    Years since quitting: 11.9  . Smokeless tobacco: Never Used  Vaping Use  . Vaping Use: Never used  Substance Use Topics  . Alcohol use: No    Alcohol/week: 0.0 standard drinks  . Drug use: No      Review of Systems Constitutional: No fever/chills, fall Eyes: No visual changes. ENT: No sore throat. Cardiovascular: Denies chest pain. Respiratory: Denies shortness of breath. Gastrointestinal: No abdominal pain.  No nausea, no vomiting.  No diarrhea.  No constipation. Genitourinary: Negative for dysuria. Musculoskeletal:  Negative for back pain. Skin: Negative for rash. Neurological: Negative for headaches, focal weakness or numbness.  Hematoma on head All other ROS negative ____________________________________________   PHYSICAL EXAM:  VITAL SIGNS: ED Triage Vitals  Enc Vitals Group     BP 02/27/21 1702 (!) 158/96     Pulse Rate 02/27/21 1702 84     Resp 02/27/21 1702 18     Temp 02/27/21 1702 97.7 F (36.5 C)     Temp Source 02/27/21 1702 Oral     SpO2 02/27/21 1702 96 %     Weight 02/27/21 1707 180 lb (81.6 kg)     Height 02/27/21 1707 5\' 4"  (1.626 m)     Head Circumference --  Peak Flow --      Pain Score 02/27/21 1707 0     Pain Loc --      Pain Edu? --      Excl. in [place]? --     Constitutional: Alert and oriented. GCS 15  Eyes: Conjunctivae are normal. EOMI. Head: Hematoma and a small abrasion noted to the back of the head without any active bleeding Nose: No congestion/rhinnorhea. Mouth/Throat: Mucous membranes are moist.   Neck: No stridor. Trachea Midline. FROM Cardiovascular: Normal rate, regular rhythm. Grossly normal heart sounds.  Good peripheral circulation. No chest wall tenderness Respiratory: Normal respiratory effort.  No retractions. Lungs CTAB. Gastrointestinal: Soft and nontender. No distention. No abdominal bruits.  Musculoskeletal:   RUE: No point tenderness, deformity or other signs of injury. Radial pulse intact. Neuro intact. Full ROM in joint. LUE: No point tenderness, deformity or other signs of injury. Radial pulse intact. Neuro intact. Full ROM in joints RLE: No point tenderness, deformity or other signs of injury. DP pulse intact. Neuro intact. Full ROM in joints. LLE: No point tenderness, deformity or other signs of injury. DP pulse intact. Neuro intact. Full ROM in joints. Neurologic:  Normal speech and language.  Slight weakness on the right with right foot drop at baseline for patient Skin:  Skin is warm, dry and intact. No rash noted. Psychiatric: Mood  and affect are normal. Speech and behavior are normal. GU: Deferred  Back: No CTL spine tenderness ____________________________________________   LABS (all labs ordered are listed, but only abnormal results are displayed)  Labs Reviewed  CBC WITH DIFFERENTIAL/PLATELET  BASIC METABOLIC PANEL  PROTIME-INR  APTT   ____________________________________________   ED ECG REPORT I, Vanessa Watersmeet, the attending physician, personally viewed and interpreted this ECG.  Normal sinus rate of 77, no ST elevation, no T wave inversions, normal intervals ____________________________________________  RADIOLOGY   Official radiology report(s): CT Head Wo Contrast  Result Date: 02/27/2021 CLINICAL DATA:  Recent fall with headaches and neck pain, initial encounter EXAM: CT HEAD WITHOUT CONTRAST CT CERVICAL SPINE WITHOUT CONTRAST TECHNIQUE: Multidetector CT imaging of the head and cervical spine was performed following the standard protocol without intravenous contrast. Multiplanar CT image reconstructions of the cervical spine were also generated. COMPARISON:  08/30/2009 FINDINGS: CT HEAD FINDINGS Brain: Mild atrophic changes are noted. Areas of prior lacunar infarcts are noted in the left basal ganglia. Additionally encephalomalacia changes are noted in the left frontal lobe consistent with prior ischemia somewhat progressed in the interval from the prior exam. No acute hemorrhage, acute infarction or space-occupying mass lesion is noted. Vascular: No hyperdense vessel or unexpected calcification. Skull: Normal. Negative for fracture or focal lesion. Sinuses/Orbits: No acute finding. Other: None. CT CERVICAL SPINE FINDINGS Alignment: Mild loss of normal cervical lordosis is noted. This may be related to mild muscular spasm. Skull base and vertebrae: 7 cervical segments are well visualized. Vertebral body height is well maintained. Multilevel facet hypertrophic changes and osteophytic changes are noted. No  acute fracture or acute facet abnormality is noted. Soft tissues and spinal canal: Surrounding soft tissue structures are within normal limits Upper chest: Visualized lung apices are unremarkable. Other: None IMPRESSION: CT of the head: Chronic atrophic and ischemic changes mildly progressed in the interval from the prior exam. CT of the cervical spine: Multilevel degenerative change without acute bony abnormality. Changes suggestive of muscular spasm. Electronically Signed   By: Inez Catalina M.D.   On: 02/27/2021 17:53   CT Cervical Spine Wo  Contrast  Result Date: 02/27/2021 CLINICAL DATA:  Recent fall with headaches and neck pain, initial encounter EXAM: CT HEAD WITHOUT CONTRAST CT CERVICAL SPINE WITHOUT CONTRAST TECHNIQUE: Multidetector CT imaging of the head and cervical spine was performed following the standard protocol without intravenous contrast. Multiplanar CT image reconstructions of the cervical spine were also generated. COMPARISON:  08/30/2009 FINDINGS: CT HEAD FINDINGS Brain: Mild atrophic changes are noted. Areas of prior lacunar infarcts are noted in the left basal ganglia. Additionally encephalomalacia changes are noted in the left frontal lobe consistent with prior ischemia somewhat progressed in the interval from the prior exam. No acute hemorrhage, acute infarction or space-occupying mass lesion is noted. Vascular: No hyperdense vessel or unexpected calcification. Skull: Normal. Negative for fracture or focal lesion. Sinuses/Orbits: No acute finding. Other: None. CT CERVICAL SPINE FINDINGS Alignment: Mild loss of normal cervical lordosis is noted. This may be related to mild muscular spasm. Skull base and vertebrae: 7 cervical segments are well visualized. Vertebral body height is well maintained. Multilevel facet hypertrophic changes and osteophytic changes are noted. No acute fracture or acute facet abnormality is noted. Soft tissues and spinal canal: Surrounding soft tissue structures are  within normal limits Upper chest: Visualized lung apices are unremarkable. Other: None IMPRESSION: CT of the head: Chronic atrophic and ischemic changes mildly progressed in the interval from the prior exam. CT of the cervical spine: Multilevel degenerative change without acute bony abnormality. Changes suggestive of muscular spasm. Electronically Signed   By: Inez Catalina M.D.   On: 02/27/2021 17:53    ____________________________________________   PROCEDURES  Procedure(s) performed (including Critical Care):  Procedures   ____________________________________________   INITIAL IMPRESSION / ASSESSMENT AND PLAN / ED COURSE    [name] was evaluated in Emergency Department on 02/27/2021 for the symptoms described in the history of present illness. She was evaluated in the context of the global COVID-19 pandemic, which necessitated consideration that the patient might be at risk for infection with the SARS-CoV-2 virus that causes COVID-19. Institutional protocols and algorithms that pertain to the evaluation of patients at risk for COVID-19 are in a state of rapid change based on information released by regulatory bodies including the CDC and federal and state organizations. These policies and algorithms were followed during the patient's care in the ED.    Patient comes in with a fall on warfarin.  Will get imaging to evaluate for intracranial hemorrhage, cervical fracture.  No chest wall tenderness, abdominal tenderness, back tenderness to suggest other injuries.  Will get INR to check warfarin level.  Does not sound like syncope but basic labs ordered to evaluate for Electra abnormalities, AKI  Labs are reassuring.  INR is therapeutic.  CT head is negative.  Reevaluated patient she states that she is doing really well.  Noted some incidental oxygen levels being charted of 93% but it is on the same arm as her blood pressure cuff and when I went to the room she was satting 95% when the  blood pressure cuff was not going off.  She denies any shortness of breath and lungs lungs clear bilaterally therefore I do not think that these are true readings.  At this point we will discharge patient home and we did discuss return precautions in regards to delayed bleeding.  I discussed the provisional nature of ED diagnosis, the treatment so far, the ongoing plan of care, follow up appointments and return precautions with the patient and any family or support people present. They  expressed understanding and agreed with the plan, discharged home.   ____________________________________________   FINAL CLINICAL IMPRESSION(S) / ED DIAGNOSES   Final diagnoses:  Fall, initial encounter  Minor head injury, initial encounter      MEDICATIONS GIVEN DURING THIS VISIT:  Medications  Tdap (BOOSTRIX) injection 0.5 mL (has no administration in time range)     ED Discharge Orders    None       Note:  This document was prepared using Dragon voice recognition software and may include unintentional dictation errors.   Vanessa Delton, MD 02/27/21 [phone]

## 2021-02-27 NOTE — ED Triage Notes (Signed)
Pt BIB EMS from home, had fall prior to calling EMS while reaching down for walker hitting head on dishwasher. After fall was able to get up and get to recliner in other room, called daughter about fall and daughter wanted her to come to ED and be checked out. Denies loss of consciousness, is on warfarin, last dose last night. EMS states swelling noted to back of head, no bleeding at site. Pt denies headache, denies N/V, pt is alert and oriented.

## 2021-02-27 NOTE — Discharge Instructions (Signed)
Your CT scan was negative but if you develop worsening headache, confusion or any other concerns you should come back in for repeat head CT.  Take Tylenol to help with your pain.

## 2021-02-27 NOTE — ED Notes (Signed)
Patient transported to CT 

## 2021-03-01 DIAGNOSIS — R7301 Impaired fasting glucose: Secondary | ICD-10-CM | POA: Diagnosis not present

## 2021-03-01 DIAGNOSIS — E119 Type 2 diabetes mellitus without complications: Secondary | ICD-10-CM | POA: Diagnosis not present

## 2021-03-01 DIAGNOSIS — Z7901 Long term (current) use of anticoagulants: Secondary | ICD-10-CM | POA: Diagnosis not present

## 2021-03-01 DIAGNOSIS — I69959 Hemiplegia and hemiparesis following unspecified cerebrovascular disease affecting unspecified side: Secondary | ICD-10-CM | POA: Diagnosis not present

## 2021-03-01 DIAGNOSIS — I10 Essential (primary) hypertension: Secondary | ICD-10-CM | POA: Diagnosis not present

## 2021-03-01 DIAGNOSIS — D689 Coagulation defect, unspecified: Secondary | ICD-10-CM | POA: Diagnosis not present

## 2021-03-01 DIAGNOSIS — R350 Frequency of micturition: Secondary | ICD-10-CM | POA: Diagnosis not present

## 2021-03-01 DIAGNOSIS — F329 Major depressive disorder, single episode, unspecified: Secondary | ICD-10-CM | POA: Diagnosis not present

## 2021-04-17 DIAGNOSIS — Z7901 Long term (current) use of anticoagulants: Secondary | ICD-10-CM | POA: Diagnosis not present

## 2021-04-17 DIAGNOSIS — M25572 Pain in left ankle and joints of left foot: Secondary | ICD-10-CM | POA: Diagnosis not present

## 2021-04-17 DIAGNOSIS — M545 Low back pain, unspecified: Secondary | ICD-10-CM | POA: Diagnosis not present

## 2021-04-17 DIAGNOSIS — M25562 Pain in left knee: Secondary | ICD-10-CM | POA: Diagnosis not present

## 2021-04-17 DIAGNOSIS — M25552 Pain in left hip: Secondary | ICD-10-CM | POA: Diagnosis not present

## 2021-04-22 DIAGNOSIS — Z20822 Contact with and (suspected) exposure to covid-19: Secondary | ICD-10-CM | POA: Diagnosis not present

## 2021-05-01 DIAGNOSIS — M810 Age-related osteoporosis without current pathological fracture: Secondary | ICD-10-CM | POA: Diagnosis not present

## 2021-05-01 DIAGNOSIS — R7301 Impaired fasting glucose: Secondary | ICD-10-CM | POA: Diagnosis not present

## 2021-05-18 DIAGNOSIS — Z7901 Long term (current) use of anticoagulants: Secondary | ICD-10-CM | POA: Diagnosis not present

## 2021-05-21 ENCOUNTER — Ambulatory Visit: Payer: Medicare Other | Attending: Internal Medicine | Admitting: Physical Therapy

## 2021-05-21 ENCOUNTER — Other Ambulatory Visit: Payer: Self-pay

## 2021-05-21 ENCOUNTER — Encounter: Payer: Self-pay | Admitting: Physical Therapy

## 2021-05-21 DIAGNOSIS — R262 Difficulty in walking, not elsewhere classified: Secondary | ICD-10-CM | POA: Diagnosis not present

## 2021-05-21 DIAGNOSIS — Z9181 History of falling: Secondary | ICD-10-CM | POA: Diagnosis not present

## 2021-05-21 NOTE — Therapy (Signed)
[place] MAIN [facility] SERVICES [address] [place], [place], [address] Phone: [phone]   Fax:  [phone]  Physical Therapy Evaluation  Patient Details  Name: [name] MRN: [id number] Date of Birth: 28-Feb-1946 Referring Provider (PT): Merlene Pulling PA-C   Encounter Date: 05/21/2021   PT End of Session - 05/21/21 1335     Visit Number 1    Number of Visits 24    Date for PT Re-Evaluation 08/13/21    Authorization Type Medicare    Authorization Time Period 8/822-08/13/21    Progress Note Due on Visit 10    PT Start Time 0921    PT Stop Time 1020    PT Time Calculation (min) 59 min    Equipment Utilized During Treatment Gait belt    Activity Tolerance Patient tolerated treatment well;Patient limited by pain;Patient limited by fatigue    Behavior During Therapy [facility] for tasks assessed/performed             Past Medical History:  Diagnosis Date   Allergic rhinitis    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   Heart murmur    mild mitral regurg-echo 2010   Hyperlipidemia    Hypertension    Lupus anticoagulant positive    Obesity    Pre-diabetes    Primary localized osteoarthritis of left hip 03/30/2019   Stroke [facility]) 2010   Some right sided weakness.  Gait issues.   Wears dentures    partial upper    Past Surgical History:  Procedure Laterality Date   ABDOMINAL HYSTERECTOMY  1981   APPENDECTOMY     AXILLARY LYMPH NODE BIOPSY Left 05/16/2014   Procedure: EXCISION 2CM LEFT AXILLARY MASS;  Surgeon: [name], MD;  Location: [place];  Service: General;  Laterality: Left;  Left axillary sebaceous cyst excision   CATARACT EXTRACTION W/PHACO Left 04/11/2020   Procedure:  CATARACT EXTRACTION PHACO AND INTRAOCULAR LENS PLACEMENT ([place]) LEFT DIABETIC TORIC LENS  6.05 00:34.9;  Surgeon: [name], MD;  Location: [place];  Service: Ophthalmology;  Laterality: Left;   CATARACT EXTRACTION W/PHACO Right 12/12/2020   Procedure: CATARACT EXTRACTION PHACO AND INTRAOCULAR LENS PLACEMENT ([place]) RIGHT TORIC LENS;  Surgeon: [name], MD;  Location: [place];  Service: Ophthalmology;  Laterality: Right;  6.55 0:41.1   DILATION AND CURETTAGE OF UTERUS     SKIN CANCER EXCISION  2016   anlke   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   Procedure: TOTAL HIP ARTHROPLASTY;  Surgeon: [name], MD;  [facility];  Service: Orthopedics;  Laterality: Left;    There were no vitals filed for this visit.    Subjective Assessment - 05/21/21 0922     Subjective Pt reports she had a stroke in 2010 and was in inpatient rehab for 4 weeks. Pt reports she has been to therapy about one time a year since the onset of her stroke for various ailments including L THA in 2020. Pt reports she has bioness L300 which improved her drop foot on her R ankle. Pt has depended on her L LE since the stroke due to her weakness and impaired funciton on the right LE. Since the fall she has had  throbbing pain in the left LE which is exacerbated with increased weightbearing activity. Pt reports she has had x rays and it shows nothing is broken but despite this she has had continued pain in the left. Pt reports pain is on the lateral aspect of her left LE up into her posterior thigh. Pt reports doctor thinks this could be from nerve related pain but will need PT in order for her insurance to cover MRI of the lumbar spine. Prior to this fall she fell and broke her right wrist.  Pt reports she can perform any activities above the level of the wast but she is unable to bend over to complete tasks. Pt reports she has multiple wakers, 3 wheel, 4 wheel, wheelchair, standard rolling  walker. Pt had brain MRI following fall and no reports of bleeding or issues.    How long can you sit comfortably? a couple hours due to low back pain    How long can you stand comfortably? 15 minutes    How long can you walk comfortably? Pt is not sure, she reports she was able to walk in and out of movies.    Currently in Pain? Yes    Pain Location Tibia    Pain Orientation Left    Pain Descriptors / Indicators Other (Comment)   throbbing   Pain Radiating Towards Left buttock    Pain Onset More than a month ago    Pain Frequency Other (Comment)   increased with weightbearing activity   Aggravating Factors  weightbearing                [facility] PT Assessment - 05/21/21 0001       Assessment   Medical Diagnosis Impaired Balance    Referring Provider (PT) Merlene Pulling PA-C    Hand Dominance Right      Precautions   Precautions Fall    Precaution Comments Fall      Restrictions   Weight Bearing Restrictions No    Other Position/Activity Restrictions Pain in the L LE with prolonged satanding      Balance Screen   Has the patient fallen in the past 6 months Yes    How many times? 1   1 in may and one last october   Has the patient had a decrease in activity level because of a fear of falling?  Yes      Cognition   Overall Cognitive Status Within Functional Limits for tasks assessed      Observation/Other Assessments   Focus on Therapeutic Outcomes (FOTO)  42      ROM / Strength   AROM / PROM / Strength Strength      Strength   Overall Strength Deficits    Strength Assessment Site Hip;Knee;Ankle    Right/Left Hip Right;Left    Right Hip Flexion 4/5    Right Hip ABduction 4+/5   Hpr ABDCT   Right Hip ADduction 5/5   Hor ABDCT   Left Hip Flexion 3+/5   limited due to pain   Left Hip ABduction 5/5   Hor ABDCT   Left Hip ADduction 5/5   Hor ABDCT   Right/Left Knee Right;Left    Right Knee Flexion 3+/5    Right Knee Extension 3+/5    Left Knee Flexion 4/5    Left  Knee Extension 4/5    Right/Left Ankle Right;Left    Right Ankle Dorsiflexion 0/5    Right Ankle Plantar Flexion 2+/5  Left Ankle Dorsiflexion 5/5    Left Ankle Plantar Flexion 5/5      Transfers   Five time sit to stand comments  36.2   Use of B UE and tripod rollator for balance and support.     Ambulation/Gait   Ambulation/Gait Yes    Ambulation/Gait Assistance 5: Supervision    Ambulation Distance (Feet) 300 Feet   2:56, rest due to pain in the left LE   Assistive device Other (Comment)   3 wheel rollator   Gait Pattern Decreased step length - right;Decreased step length - left;Decreased stance time - left;Decreased stride length;Decreased dorsiflexion - right    Ambulation Surface Level    Gait Comments Utilizes 3 wheel rolator device and required break due to fatigue and L LE pain, not able ot complete full 6 minutes of walking      6 minute walk test results    Aerobic Endurance Distance Walked 300    Endurance additional comments 2:56 ambulation time                        Objective measurements completed on examination: See above findings.                 PT Short Term Goals - 05/21/21 1244       PT SHORT TERM GOAL #1   Title Patient will be independent in home exercise program to improve strength/mobility for better functional independence with ADLs.    Baseline Pt does not have an HEP    Time 3    Period Weeks    Status New    Target Date 06/11/21      PT SHORT TERM GOAL #2   Title Pt will be able to complete 6 MWT without requiring rest break or sitting in order to indicate ipmroved aerobic and muscular endurance for everyday tasks.    Baseline Pt ambulated 300 feet in 2:56 and required rest break    Time 4    Period Weeks    Status New    Target Date 06/18/21      PT SHORT TERM GOAL #3   Title Patient will complete five times sit to stand test in < 25 seconds indicating an increased LE strength and improved balance.     Baseline 36 seconds with use of B UE    Time 4    Period Weeks    Status New    Target Date 06/18/21               PT Long Term Goals - 05/21/21 1250       PT LONG TERM GOAL #1   Title Pt will improve FOTO score to 53 in order to indicate improvement with everyday tasks    Baseline FOTO score 42    Time 10    Period Weeks    Status New    Target Date 07/30/21      PT LONG TERM GOAL #2   Title Patient will complete five times sit to stand test in < 20 seconds indicating an increased LE strength and improved balance.    Baseline 5 x STS in 36.2 seconds    Time 10    Period Weeks    Status New    Target Date 07/30/21      PT LONG TERM GOAL #3   Title Patient will increase six minute walk test distance to >900 feet for progression to improve gait  ability and safety with ambulation    Baseline 300 feet in 2:56    Time 12    Period Weeks    Status New    Target Date 08/13/21                    Plan - 05/21/21 0948     Clinical Impression Statement Pt is a 75 y.o. female who presents to therapy for evaluation following multiple falls within the last year. Pt report most recent fall was in 02/2021 where she sustained injury to her head and has had residual pain in her L LE as a result. Pt has deficits in ambulatory capacity, ambulation endurance, muscular strength in addition to balance deficits. Due to patients recent falls and her performance with physical testing today pt has increased fall risk. Due to her fall risk in addition to concerns with falling pt has been completing fewer activities and exercises in her home and community. Due to her deficits, activity limitatins and participation restrictions pt will benefit from skilled physical therpay intervention in order to improve her function, fall risk and overall QOL.    Personal Factors and Comorbidities Age;Comorbidity 1    Examination-Activity Limitations Bend;Carry;Squat;Stairs    Examination-Participation  Restrictions Cleaning;Laundry    Stability/Clinical Decision Making Evolving/Moderate complexity    Clinical Decision Making Moderate    Rehab Potential Good    PT Frequency 2x / week    PT Duration 12 weeks    PT Treatment/Interventions ADLs/Self Care Home Management;Aquatic Therapy;Electrical Stimulation;Gait training;Stair training;Functional mobility training;Therapeutic activities;Therapeutic exercise;Balance training;Neuromuscular re-education;Patient/family education;Prosthetic Training;Manual techniques;Passive range of motion;Energy conservation    PT Next Visit Plan establish HEP, work on strenght and endurance while monitoring her L LE for pain and dioscmofort    PT Home Exercise Plan Will establish HEP at next visit, also plan to test TUG and possible additional balance screen    Consulted and Agree with Plan of Care Patient             Patient will benefit from skilled therapeutic intervention in order to improve the following deficits and impairments:  Abnormal gait, Decreased balance, Decreased endurance, Decreased mobility, Decreased activity tolerance  Visit Diagnosis: Difficulty in walking, not elsewhere classified - Plan: PT plan of care cert/re-cert  History of falling - Plan: PT plan of care cert/re-cert     Problem List Patient Active Problem List   Diagnosis Date Noted   Primary localized osteoarthritis of left hip 03/30/2019   Status post left hip replacement 03/30/2019   Rivka Barbara PT, DPT   Particia Lather 05/21/2021, 3:10 PM  [place] [address] [place], [place], [address] Phone: [phone]   Fax:  [phone]  Name: [name] MRN: [id number] Date of Birth: March 15, 1946

## 2021-05-24 ENCOUNTER — Other Ambulatory Visit: Payer: Self-pay

## 2021-05-24 ENCOUNTER — Ambulatory Visit: Payer: Medicare Other | Admitting: Physical Therapy

## 2021-05-24 ENCOUNTER — Encounter: Payer: Self-pay | Admitting: Physical Therapy

## 2021-05-24 DIAGNOSIS — Z9181 History of falling: Secondary | ICD-10-CM | POA: Diagnosis not present

## 2021-05-24 DIAGNOSIS — R262 Difficulty in walking, not elsewhere classified: Secondary | ICD-10-CM | POA: Diagnosis not present

## 2021-05-24 NOTE — Therapy (Signed)
[place] MAIN [facility] SERVICES [address] [place], [place], [address] Phone: [phone]   Fax:  [phone]  Physical Therapy Treatment  Patient Details  Name: [name] MRN: [id number] Date of Birth: 1946-10-07 Referring Provider (PT): Merlene Pulling PA-C   Encounter Date: 05/24/2021   PT End of Session - 05/24/21 1114     Visit Number 2    Number of Visits 24    Date for PT Re-Evaluation 08/13/21    Authorization Type Medicare    Authorization Time Period 8/822-08/13/21    Progress Note Due on Visit 10    PT Start Time 1017    PT Stop Time 1102    PT Time Calculation (min) 45 min    Equipment Utilized During Treatment Gait belt    Activity Tolerance Patient tolerated treatment well;Patient limited by pain;Patient limited by fatigue    Behavior During Therapy [facility] for tasks assessed/performed             Past Medical History:  Diagnosis Date   Allergic rhinitis    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   Heart murmur    mild mitral regurg-echo 2010   Hyperlipidemia    Hypertension    Lupus anticoagulant positive    Obesity    Pre-diabetes    Primary localized osteoarthritis of left hip 03/30/2019   Stroke [facility]) 2010   Some right sided weakness.  Gait issues.   Wears dentures    partial upper    Past Surgical History:  Procedure Laterality Date   ABDOMINAL HYSTERECTOMY  1981   APPENDECTOMY     AXILLARY LYMPH NODE BIOPSY Left 05/16/2014   Procedure: EXCISION 2CM LEFT AXILLARY MASS;  Surgeon: [name], MD;  Location: [place];  Service: General;  Laterality: Left;  Left axillary sebaceous cyst excision   CATARACT EXTRACTION W/PHACO Left 04/11/2020   Procedure:  CATARACT EXTRACTION PHACO AND INTRAOCULAR LENS PLACEMENT ([place]) LEFT DIABETIC TORIC LENS  6.05 00:34.9;  Surgeon: [name], MD;  Location: [place];  Service: Ophthalmology;  Laterality: Left;   CATARACT EXTRACTION W/PHACO Right 12/12/2020   Procedure: CATARACT EXTRACTION PHACO AND INTRAOCULAR LENS PLACEMENT ([place]) RIGHT TORIC LENS;  Surgeon: [name], MD;  Location: [place];  Service: Ophthalmology;  Laterality: Right;  6.55 0:41.1   DILATION AND CURETTAGE OF UTERUS     SKIN CANCER EXCISION  2016   anlke   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   Procedure: TOTAL HIP ARTHROPLASTY;  Surgeon: [name], MD;  [facility];  Service: Orthopedics;  Laterality: Left;    There were no vitals filed for this visit.   Subjective Assessment - 05/24/21 1030     Subjective Pt reports no significant changes since last time. Reports she has been resting following her    Pertinent History Pt reports she had a stroke in 2010 and was in inpatient rehab for 4 weeks. Pt reports she has been to therapy about one time a year since the onset of her stroke for various ailments including L THA in 2020. Pt reports she has bioness L300 which improved her drop foot on her R ankle. Pt has depended on her L LE since  the stroke due to her weakness and impaired funciton on the right LE. Since the fall she has had throbbing pain in the left LE which is exacerbated with increased weightbearing activity. Pt reports she has had x rays and it shows nothing is broken but despite this she has had continued pain in the left. Pt reports pain is on the lateral aspect of her left LE up into her posterior thigh. Pt reports doctor thinks this could be from nerve related pain but will need PT in order for her insurance to cover MRI of the lumbar spine. Prior to this fall she fell and broke her right wrist.  Pt reports she can perform any activities above the level of the wast but she is unable to  bend over to complete tasks. Pt reports she has multiple wakers, 3 wheel, 4 wheel, wheelchair, standard rolling walker. Pt had brain MRI following fall and no reports of bleeding or issues.    Limitations Walking    How long can you sit comfortably? a couple hours due to low back pain    How long can you stand comfortably? 15 minutes    How long can you walk comfortably? Pt is not sure, she reports she was able to walk in and out of movies.    Currently in Pain? Yes    Pain Score 7     Pain Orientation Left    Pain Radiating Towards left buttock    Pain Onset More than a month ago    Pain Frequency Constant    Aggravating Factors  weightbearing                   Treatment today:  The following activities were completed in parallel bars or at balance bar   -Marching 2 x 20 with B HH  - Adducted stance : x 30 sec with CGA  -Semitandem stance x 30 se ea with CGA no UE use  -Tandem stance 2 x 30 seconds with CGA, pt demonstrated increased sway in frontal plane and also required occasional use of hands on balance bar    LAQ x 10 each with 2 sec hold   STS with airex pad under seat and B HH assist  - 3 x 5 , pt required cues for proper use of UE transition from the arm of chair to her 3 wheeled Rolator for support   Noted improvement in transition from sit to stand following this exercise due to sequencing of hand movements from chair to rollator.   - L gastroc stretch seated (performed by PT) 2 x 30 sec   Walking:   Approximately 200 feet with three wheel Rolator. Pt reported pain was slightly improved in "acuteness" following gastroc strech but pain returned by end of ambulatory bout  Seated SLR test: negative, pt actually reported decrease in pain with testing position, reports it is due to ankle orientation into dorsiflexion                        PT Education - 05/24/21 1037     Education Details Pt educateed regarding importance of training  balance to prevent falls    Person(s) Educated Patient    Methods Explanation    Comprehension Verbalized understanding              PT Short Term Goals - 05/21/21 1244       PT SHORT TERM GOAL #1   Title Patient  will be independent in home exercise program to improve strength/mobility for better functional independence with ADLs.    Baseline Pt does not have an HEP    Time 3    Period Weeks    Status New    Target Date 06/11/21      PT SHORT TERM GOAL #2   Title Pt will be able to complete 6 MWT without requiring rest break or sitting in order to indicate ipmroved aerobic and muscular endurance for everyday tasks.    Baseline Pt ambulated 300 feet in 2:56 and required rest break    Time 4    Period Weeks    Status New    Target Date 06/18/21      PT SHORT TERM GOAL #3   Title Patient will complete five times sit to stand test in < 25 seconds indicating an increased LE strength and improved balance.    Baseline 36 seconds with use of B UE    Time 4    Period Weeks    Status New    Target Date 06/18/21               PT Long Term Goals - 05/21/21 1250       PT LONG TERM GOAL #1   Title Pt will improve FOTO score to 53 in order to indicate improvement with everyday tasks    Baseline FOTO score 42    Time 10    Period Weeks    Status New    Target Date 07/30/21      PT LONG TERM GOAL #2   Title Patient will complete five times sit to stand test in < 20 seconds indicating an increased LE strength and improved balance.    Baseline 5 x STS in 36.2 seconds    Time 10    Period Weeks    Status New    Target Date 07/30/21      PT LONG TERM GOAL #3   Title Patient will increase six minute walk test distance to >900 feet for progression to improve gait ability and safety with ambulation    Baseline 300 feet in 2:56    Time 12    Period Weeks    Status New    Target Date 08/13/21                   Plan - 05/24/21 1115     Clinical Impression  Statement ?Continued with current plan of care as laid out in evaluation and recent prior sessions. Pt remains motivated to advance progress toward goals in order to maximize independence and safety at home. Pt requires high level assistance and cuing for completion of exercises in order to provide adequate level of stimulation and perturbation. Author allows pt as much opportunity as possible to perform independent righting strategies, only stepping in when pt is unable to prevent falling to floor. Pt continues to demonstrate progress toward goals AEB progression of some interventions this date either in volume or intensity. Pt performed TUG in 25.6 sec with AD and use of B UE  indicating increased risk for falls.    Personal Factors and Comorbidities Age;Comorbidity 1    Examination-Activity Limitations Bend;Carry;Squat;Stairs    Examination-Participation Restrictions Cleaning;Laundry    Stability/Clinical Decision Making Evolving/Moderate complexity    Clinical Decision Making Moderate    Rehab Potential Good    PT Frequency 2x / week    PT Duration 12 weeks    PT  Treatment/Interventions ADLs/Self Care Home Management;Aquatic Therapy;Electrical Stimulation;Gait training;Stair training;Functional mobility training;Therapeutic activities;Therapeutic exercise;Balance training;Neuromuscular re-education;Patient/family education;Prosthetic Training;Manual techniques;Passive range of motion;Energy conservation    PT Next Visit Plan Continue to progress HEP, provide written version, assess hip strength as well as consider supine SLR test    PT Home Exercise Plan Pt educated to practice with marching and to not perform exercises if they increase her LE pain    Consulted and Agree with Plan of Care Patient             Patient will benefit from skilled therapeutic intervention in order to improve the following deficits and impairments:  Abnormal gait, Decreased balance, Decreased endurance, Decreased  mobility, Decreased activity tolerance  Visit Diagnosis: Difficulty in walking, not elsewhere classified  History of falling     Problem List Patient Active Problem List   Diagnosis Date Noted   Primary localized osteoarthritis of left hip 03/30/2019   Status post left hip replacement 03/30/2019    Particia Lather PT, DPT  05/25/2021, 8:04 AM  [place] [address] [place], [place], [address] Phone: [phone]   Fax:  [phone]  Name: [name] MRN: [id number] Date of Birth: 10/25/1945

## 2021-05-29 ENCOUNTER — Other Ambulatory Visit: Payer: Self-pay

## 2021-05-29 ENCOUNTER — Ambulatory Visit: Payer: Medicare Other | Admitting: Physical Therapy

## 2021-05-29 DIAGNOSIS — Z9181 History of falling: Secondary | ICD-10-CM

## 2021-05-29 DIAGNOSIS — R262 Difficulty in walking, not elsewhere classified: Secondary | ICD-10-CM

## 2021-05-29 NOTE — Therapy (Signed)
[place] MAIN [facility] SERVICES [address] [place], [place], [address] Phone: [phone]   Fax:  [phone]  Physical Therapy Treatment  Patient Details  Name: QUINNETTA MACEACHERN MRN: [id number] Date of Birth: 13-Jul-1946 Referring Provider (PT): Merlene Pulling PA-C   Encounter Date: 05/29/2021   PT End of Session - 05/29/21 1154     Visit Number 3    Number of Visits 24    Date for PT Re-Evaluation 08/13/21    Authorization Type Medicare    Authorization Time Period 05/21/21-08/13/21    Progress Note Due on Visit 10    PT Start Time 1105    PT Stop Time 1145    PT Time Calculation (min) 40 min    Equipment Utilized During Treatment Gait belt    Activity Tolerance Patient tolerated treatment well;Patient limited by pain;Patient limited by fatigue;Other (comment)   limited by her bioness device not working at this time   Behavior During Therapy WFL for tasks assessed/performed             Past Medical History:  Diagnosis Date   Allergic rhinitis    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   Heart murmur    mild mitral regurg-echo 2010   Hyperlipidemia    Hypertension    Lupus anticoagulant positive    Obesity    Pre-diabetes    Primary localized osteoarthritis of left hip 03/30/2019   Stroke [facility]) 2010   Some right sided weakness.  Gait issues.   Wears dentures    partial upper    Past Surgical History:  Procedure Laterality Date   ABDOMINAL HYSTERECTOMY  1981   APPENDECTOMY     AXILLARY LYMPH NODE BIOPSY Left 05/16/2014   Procedure: EXCISION 2CM LEFT AXILLARY MASS;  Surgeon: [name], MD;  Location: [place];  Service: General;  Laterality: Left;  Left axillary sebaceous  cyst excision   CATARACT EXTRACTION W/PHACO Left 04/11/2020   Procedure: CATARACT EXTRACTION PHACO AND INTRAOCULAR LENS PLACEMENT ([place]) LEFT DIABETIC TORIC LENS  6.05 00:34.9;  Surgeon: [name], MD;  Location: [place];  Service: Ophthalmology;  Laterality: Left;   CATARACT EXTRACTION W/PHACO Right 12/12/2020   Procedure: CATARACT EXTRACTION PHACO AND INTRAOCULAR LENS PLACEMENT ([place]) RIGHT TORIC LENS;  Surgeon: [name], MD;  Location: [place];  Service: Ophthalmology;  Laterality: Right;  6.55 0:41.1   DILATION AND CURETTAGE OF UTERUS     SKIN CANCER EXCISION  2016   [place]   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   Procedure: TOTAL HIP ARTHROPLASTY;  Surgeon: [name], MD;  Location: [facility];  Service: Orthopedics;  Laterality: Left;    There were no vitals filed for this visit.   Subjective Assessment - 05/29/21 1110     Subjective Pt reports her bioness device has stopped working and she is waiting on replacement part to ship in the mail. Pt reports she is having a lot more difficulty ambulating without her bioness.    Pertinent History Pt reports she had a stroke in 2010 and was in inpatient rehab for 4 weeks. Pt reports she has been to therapy about one time a year since the onset of her stroke for  various ailments including L THA in 2020. Pt reports she has bioness L300 which improved her drop foot on her R ankle. Pt has depended on her L LE since the stroke due to her weakness and impaired funciton on the right LE. Since the fall she has had throbbing pain in the left LE which is exacerbated with increased weightbearing activity. Pt reports she has had x rays and it shows nothing is broken but despite this she has had continued pain in the left. Pt reports pain is on the lateral aspect of her left LE up into her posterior thigh. Pt reports doctor thinks this could be from nerve related pain but will need PT in order for her insurance to cover MRI of  the lumbar spine. Prior to this fall she fell and broke her right wrist.  Pt reports she can perform any activities above the level of the wast but she is unable to bend over to complete tasks. Pt reports she has multiple wakers, 3 wheel, 4 wheel, wheelchair, standard rolling walker. Pt had brain MRI following fall and no reports of bleeding or issues.    Limitations Walking    How long can you sit comfortably? a couple hours due to low back pain    How long can you stand comfortably? 15 minutes    How long can you walk comfortably? Pt is not sure, she reports she was able to walk in and out of movies.    Currently in Pain? Yes    Pain Score 7     Pain Location Tibia    Pain Orientation Left    Pain Onset More than a month ago    Pain Frequency Constant    Aggravating Factors  weightbearing              Treatment this session :  The following activities were completed in parallel bars or at balance bar    LAQ 2 x 10 each LE  with 2 sec hold, pt unable to control right knee extension with concurrent left knee extension, reports it could be due to not having bioness  -Marching 2 x 20 with B HH  Sidestepping, 3 laps at balance station, pt utilized significant UE support to complete task.  -Tandem stance  x 30 seconds with CGA, pt demonstrated increased sway in frontal plane and also required occasional use of hands on balance bar    STS with airex pad under seat and B HH assist  -  2 x 5 , pt required cues for proper use of UE transition from the arm of chair to her 3 wheeled Rolator for support     - L gastroc stretch seated (performed by PT) 2 x 30 sec  - L gastric towel stretch 1 x 30 sec with frequent cues provided.    Pt required increased rest between exercises today, likely due to increase in movement cost with walking over the past few days without bioness. Following ambulation from pt waiting room to clinic pt showed increased RR as and required rest, she was not nearly as  fatigued with the same activity last session performing the same ambulation distance.                          PT Education - 05/29/21 1145     Education Details Pt educated regarding ensuring safety with ambulation until her bioness replacement part comes in the mail  Person(s) Educated Patient    Methods Explanation    Comprehension Verbalized understanding              PT Short Term Goals - 05/21/21 1244       PT SHORT TERM GOAL #1   Title Patient will be independent in home exercise program to improve strength/mobility for better functional independence with ADLs.    Baseline Pt does not have an HEP    Time 3    Period Weeks    Status New    Target Date 06/11/21      PT SHORT TERM GOAL #2   Title Pt will be able to complete 6 MWT without requiring rest break or sitting in order to indicate ipmroved aerobic and muscular endurance for everyday tasks.    Baseline Pt ambulated 300 feet in 2:56 and required rest break    Time 4    Period Weeks    Status New    Target Date 06/18/21      PT SHORT TERM GOAL #3   Title Patient will complete five times sit to stand test in < 25 seconds indicating an increased LE strength and improved balance.    Baseline 36 seconds with use of B UE    Time 4    Period Weeks    Status New    Target Date 06/18/21               PT Long Term Goals - 05/21/21 1250       PT LONG TERM GOAL #1   Title Pt will improve FOTO score to 53 in order to indicate improvement with everyday tasks    Baseline FOTO score 42    Time 10    Period Weeks    Status New    Target Date 07/30/21      PT LONG TERM GOAL #2   Title Patient will complete five times sit to stand test in < 20 seconds indicating an increased LE strength and improved balance.    Baseline 5 x STS in 36.2 seconds    Time 10    Period Weeks    Status New    Target Date 07/30/21      PT LONG TERM GOAL #3   Title Patient will increase six minute walk test  distance to >900 feet for progression to improve gait ability and safety with ambulation    Baseline 300 feet in 2:56    Time 12    Period Weeks    Status New    Target Date 08/13/21                   Plan - 05/29/21 1156     Clinical Impression Statement Continued with current plan of care as laid out in evaluation and recent prior sessions. Pt remains motivated to advance progress toward goals in order to maximize independence and safety at home. Pt requires high level assistance and cuing for completion of exercises in order to provide adequate level of stimulation and perturbation. Author allows pt as much opportunity as possible to perform independent righting strategies, only stepping in when pt is unable to prevent falling to floor. Pt treatment session was limited today due to her bioness system not working and her replacement part for it not arriving yet. Pt was instructed to monitor her activity to prevent falls between now and when the replacement part arrives.    Personal Factors and Comorbidities Age;Comorbidity 1  Examination-Activity Limitations Bend;Carry;Squat;Stairs    Examination-Participation Restrictions Cleaning;Laundry    Stability/Clinical Decision Making Evolving/Moderate complexity    Clinical Decision Making Moderate    Rehab Potential Good    PT Frequency 2x / week    PT Duration 12 weeks    PT Treatment/Interventions ADLs/Self Care Home Management;Aquatic Therapy;Electrical Stimulation;Gait training;Stair training;Functional mobility training;Therapeutic activities;Therapeutic exercise;Balance training;Neuromuscular re-education;Patient/family education;Prosthetic Training;Manual techniques;Passive range of motion;Energy conservation    PT Next Visit Plan Continue to progress HEP, provide written version, assess hip strength    PT Home Exercise Plan Pt instructed to add seated gastroc stretch to HEP and continue with other HEP we have discussed in  previous sessions    Consulted and Agree with Plan of Care Patient             Patient will benefit from skilled therapeutic intervention in order to improve the following deficits and impairments:  Abnormal gait, Decreased balance, Decreased endurance, Decreased mobility, Decreased activity tolerance  Visit Diagnosis: Difficulty in walking, not elsewhere classified  History of falling     Problem List Patient Active Problem List   Diagnosis Date Noted   Primary localized osteoarthritis of left hip 03/30/2019   Status post left hip replacement 03/30/2019   Rivka Barbara PT, DPT   Particia Lather  05/29/2021, 12:21 PM  [facility] [address] [place], [place], [address] Phone: [phone]   Fax:  [phone]  Name: OLETA AMSLER MRN: [id number] Date of Birth: [birth year]

## 2021-05-31 ENCOUNTER — Ambulatory Visit: Payer: Medicare Other | Admitting: Physical Therapy

## 2021-05-31 ENCOUNTER — Other Ambulatory Visit: Payer: Self-pay

## 2021-05-31 DIAGNOSIS — Z9181 History of falling: Secondary | ICD-10-CM | POA: Diagnosis not present

## 2021-05-31 DIAGNOSIS — R262 Difficulty in walking, not elsewhere classified: Secondary | ICD-10-CM | POA: Diagnosis not present

## 2021-05-31 NOTE — Therapy (Addendum)
[place] MAIN [facility] SERVICES [address] [place], [place], [address] Phone: [phone]   Fax:  [phone]  Physical Therapy Treatment  Patient Details  Name: [name] MRN: [id number] Date of Birth: Oct 19, 1945 Referring Provider (PT): Merlene Pulling PA-C   Encounter Date: 05/31/2021   PT End of Session - 05/31/21 1315     Visit Number 4    Number of Visits 24    Date for PT Re-Evaluation 08/13/21    Authorization Type Medicare    Authorization Time Period 05/21/21-08/13/21    Progress Note Due on Visit 10    PT Start Time 1108    PT Stop Time 1147    PT Time Calculation (min) 39 min    Equipment Utilized During Treatment Gait belt    Activity Tolerance Patient tolerated treatment well;Patient limited by pain;Patient limited by fatigue;Other (comment)    Behavior During Therapy WFL for tasks assessed/performed             Past Medical History:  Diagnosis Date   Allergic rhinitis    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)    no meds-diet controlled   Factor 5 Leiden mutation, heterozygous ([place])    Family history of adverse reaction to anesthesia    Daughter was "paralyzed" for several days after 1 surgery.   Heart murmur    mild mitral regurg-echo 2010   Hyperlipidemia    Hypertension    Lupus anticoagulant positive    Obesity    Pre-diabetes    Primary localized osteoarthritis of left hip 03/30/2019   Stroke [facility]) 2010   Some right sided weakness.  Gait issues.   Wears dentures    partial upper    Past Surgical History:  Procedure Laterality Date   ABDOMINAL HYSTERECTOMY  1981   APPENDECTOMY     AXILLARY LYMPH NODE BIOPSY Left 05/16/2014   Procedure: EXCISION 2CM LEFT AXILLARY MASS;  Surgeon: [name], MD;  Location: [place];  Service: General;  Laterality: Left;  Left axillary sebaceous cyst excision   CATARACT EXTRACTION W/PHACO Left  04/11/2020   Procedure: CATARACT EXTRACTION PHACO AND INTRAOCULAR LENS PLACEMENT ([place]) LEFT DIABETIC TORIC LENS  6.05 00:34.9;  Surgeon: [name], MD;  Location: [place];  Service: Ophthalmology;  Laterality: Left;   CATARACT EXTRACTION W/PHACO Right 12/12/2020   Procedure: CATARACT EXTRACTION PHACO AND INTRAOCULAR LENS PLACEMENT ([place]) RIGHT TORIC LENS;  Surgeon: [name], MD;  Location: [place];  Service: Ophthalmology;  Laterality: Right;  6.55 0:41.1   DILATION AND CURETTAGE OF UTERUS     SKIN CANCER EXCISION  2016   [place]   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   Procedure: TOTAL HIP ARTHROPLASTY;  Surgeon: [name], MD;  [facility];  Service: Orthopedics;  Laterality: Left;    There were no vitals filed for this visit.   Subjective Assessment - 05/31/21 1111     Subjective Pt reports her bioness replacement part came in and is now working properly. She reports some soreness in the left LE, likely from when she was without her bioness for several days and was relying on the L LE for increased weigtbearing and use.    Pertinent History Pt reports she had a stroke in 2010 and was in inpatient rehab for 4 weeks. Pt reports she has been to therapy about one time a year since the onset of her stroke for  various ailments including L THA in 2020. Pt reports she has bioness L300 which improved her drop foot on her R ankle. Pt has depended on her L LE since the stroke due to her weakness and impaired funciton on the right LE. Since the fall she has had throbbing pain in the left LE which is exacerbated with increased weightbearing activity. Pt reports she has had x rays and it shows nothing is broken but despite this she has had continued pain in the left. Pt reports pain is on the lateral aspect of her left LE up into her posterior thigh. Pt reports doctor thinks this could be from nerve related pain but will need PT in order for her insurance to cover  MRI of the lumbar spine. Prior to this fall she fell and broke her right wrist.  Pt reports she can perform any activities above the level of the wast but she is unable to bend over to complete tasks. Pt reports she has multiple wakers, 3 wheel, 4 wheel, wheelchair, standard rolling walker. Pt had brain MRI following fall and no reports of bleeding or issues.    Limitations Walking    How long can you sit comfortably? a couple hours due to low back pain    How long can you stand comfortably? 15 minutes    How long can you walk comfortably? Pt is not sure, she reports she was able to walk in and out of movies.    Currently in Pain? Yes    Pain Score 8     Pain Location Other (Comment)    Pain Orientation Left    Pain Descriptors / Indicators Other (Comment)    Pain Radiating Towards left buttock    Pain Onset More than a month ago    Pain Frequency Constant    Aggravating Factors  weightbearing               Treatment This session  Ambulation with CGA and RW x 300 feet  Seated Gastroc stretch  3 x 30 seconds with cues to keep knee straight LAQ 2 x 10, some difficulty noted due to bioness attempting to activate dorsiflexors  STS 2 x 10 with airex pad in standard chair to increase chair height and decrease difficulty. Pt still utilizing B UE in order to get up and has significant difficulty when attempting task with decreased UE use due to balance and stability dificulties  Adducted Stance, 2 x 30 s with CGA  Semitandem stance 2x 30 sec with CGA, lateral sway noted  Side stepping with B UE on parallel bars.  Standing march with B UE on parallel bars.       Pt required occasional rest breaks due fatigue, PT was quick to ask when pt appeared to be fatiguing in order to prevent excessive fatigue.                          PT Education - 05/31/21 1135     Education Details Pt instructed to add STS as part of HEPas well as marching with counter suppport     Person(s) Educated Patient    Methods Handout;Explanation;Demonstration    Comprehension Verbalized understanding              PT Short Term Goals - 05/21/21 1244       PT SHORT TERM GOAL #1   Title Patient will be independent in home exercise program to improve strength/mobility  for better functional independence with ADLs.    Baseline Pt does not have an HEP    Time 3    Period Weeks    Status New    Target Date 06/11/21      PT SHORT TERM GOAL #2   Title Pt will be able to complete 6 MWT without requiring rest break or sitting in order to indicate ipmroved aerobic and muscular endurance for everyday tasks.    Baseline Pt ambulated 300 feet in 2:56 and required rest break    Time 4    Period Weeks    Status New    Target Date 06/18/21      PT SHORT TERM GOAL #3   Title Patient will complete five times sit to stand test in < 25 seconds indicating an increased LE strength and improved balance.    Baseline 36 seconds with use of B UE    Time 4    Period Weeks    Status New    Target Date 06/18/21               PT Long Term Goals - 05/21/21 1250       PT LONG TERM GOAL #1   Title Pt will improve FOTO score to 53 in order to indicate improvement with everyday tasks    Baseline FOTO score 42    Time 10    Period Weeks    Status New    Target Date 07/30/21      PT LONG TERM GOAL #2   Title Patient will complete five times sit to stand test in < 20 seconds indicating an increased LE strength and improved balance.    Baseline 5 x STS in 36.2 seconds    Time 10    Period Weeks    Status New    Target Date 07/30/21      PT LONG TERM GOAL #3   Title Patient will increase six minute walk test distance to >900 feet for progression to improve gait ability and safety with ambulation    Baseline 300 feet in 2:56    Time 12    Period Weeks    Status New    Target Date 08/13/21                   Plan - 05/31/21 1316     Clinical Impression Statement  Continued with current plan of care as laid out in evaluation and recent prior sessions. Pt remains motivated to advance progress toward goals in order to maximize independence and safety at home. Pt requires high level assistance and cuing for completion of exercises in order to provide adequate level of stimulation and perturbation. Author allows pt as much opportunity as possible to perform independent righting strategies, only stepping in when pt is unable to prevent falling to floor. Pt continues to have difficulty with transitionin from sit to stand, particularly when she attempts when not holding on to the 3 wheel rollator. Will continue to work on this in future sessions.    Personal Factors and Comorbidities Age;Comorbidity 1    Examination-Activity Limitations Bend;Carry;Squat;Stairs    Examination-Participation Restrictions Cleaning;Laundry    Stability/Clinical Decision Making Evolving/Moderate complexity    Rehab Potential Good    PT Frequency 2x / week    PT Duration 12 weeks    PT Treatment/Interventions ADLs/Self Care Home Management;Aquatic Therapy;Electrical Stimulation;Gait training;Stair training;Functional mobility training;Therapeutic activities;Therapeutic exercise;Balance training;Neuromuscular re-education;Patient/family education;Prosthetic Training;Manual techniques;Passive range of motion;Energy conservation  PT Next Visit Plan Continue to progress HEP, provide written version, assess hip strength    PT Home Exercise Plan Added STS and marching to pt HEP on 05/31/21    Consulted and Agree with Plan of Care Patient             Patient will benefit from skilled therapeutic intervention in order to improve the following deficits and impairments:  Abnormal gait, Decreased balance, Decreased endurance, Decreased mobility, Decreased activity tolerance  Visit Diagnosis: Difficulty in walking, not elsewhere classified  History of falling     Problem List Patient  Active Problem List   Diagnosis Date Noted   Primary localized osteoarthritis of left hip 03/30/2019   Status post left hip replacement 03/30/2019   Rivka Barbara PT, DPT   Particia Lather 05/31/2021, 1:20 PM  [facility] [address] [place], [place], [address] Phone: [phone]   Fax:  [phone]  Name: [name] MRN: [id number] Date of Birth: 02-Jun-1946

## 2021-06-05 ENCOUNTER — Other Ambulatory Visit: Payer: Self-pay

## 2021-06-05 ENCOUNTER — Ambulatory Visit: Payer: Medicare Other | Admitting: Physical Therapy

## 2021-06-05 DIAGNOSIS — R262 Difficulty in walking, not elsewhere classified: Secondary | ICD-10-CM | POA: Diagnosis not present

## 2021-06-05 DIAGNOSIS — Z9181 History of falling: Secondary | ICD-10-CM | POA: Diagnosis not present

## 2021-06-05 NOTE — Therapy (Signed)
[place] MAIN [facility] SERVICES [address] [place], [place], [address] Phone: [phone]   Fax:  [phone]  Physical Therapy Treatment  Patient Details  Name: Tiffany PANCIERA MRN: [id number] Date of Birth: [birth year] Referring Provider (PT): Merlene Pulling PA-C   Encounter Date: 06/05/2021   PT End of Session - 06/05/21 1214     Visit Number 5    Number of Visits 24    Date for PT Re-Evaluation 08/13/21    Authorization Type Medicare    Authorization Time Period 05/21/21-08/13/21    Progress Note Due on Visit 10    PT Start Time 1120    PT Stop Time 1200    PT Time Calculation (min) 40 min    Equipment Utilized During Treatment Gait belt    Activity Tolerance Patient tolerated treatment well;Patient limited by pain;Patient limited by fatigue;Other (comment)    Behavior During Therapy WFL for tasks assessed/performed             Past Medical History:  Diagnosis Date   Allergic rhinitis    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   Heart murmur    mild mitral regurg-echo 2010   Hyperlipidemia    Hypertension    Lupus anticoagulant positive    Obesity    Pre-diabetes    Primary localized osteoarthritis of left hip 03/30/2019   Stroke [facility]) 2010   Some right sided weakness.  Gait issues.   Wears dentures    partial upper    Past Surgical History:  Procedure Laterality Date   ABDOMINAL HYSTERECTOMY  1981   APPENDECTOMY     AXILLARY LYMPH NODE BIOPSY Left 05/16/2014   Procedure: EXCISION 2CM LEFT AXILLARY MASS;  Surgeon: [name], MD;  Location: [place];  Service: General;  Laterality: Left;  Left axillary sebaceous cyst excision   CATARACT EXTRACTION W/PHACO Left  04/11/2020   Procedure: CATARACT EXTRACTION PHACO AND INTRAOCULAR LENS PLACEMENT ([place]) LEFT DIABETIC TORIC LENS  6.05 00:34.9;  Surgeon: [name], MD;  Location: [place];  Service: Ophthalmology;  Laterality: Left;   CATARACT EXTRACTION W/PHACO Right 12/12/2020   Procedure: CATARACT EXTRACTION PHACO AND INTRAOCULAR LENS PLACEMENT ([place]) RIGHT TORIC LENS;  Surgeon: [name], MD;  Location: [place];  Service: Ophthalmology;  Laterality: Right;  6.55 0:41.1   DILATION AND CURETTAGE OF UTERUS     SKIN CANCER EXCISION  2016   [place]   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   Procedure: TOTAL HIP ARTHROPLASTY;  Surgeon: [name], MD;  [facility];  Service: Orthopedics;  Laterality: Left;    There were no vitals filed for this visit.   Subjective Assessment - 06/05/21 1124     Subjective Pt reports she is having continued pain and limitations due to pain in her L LE. She also reports she has been completing her HEP but has not been able to complete it on a daily basis at this time.    Pertinent History Pt reports she had a stroke in 2010 and was in inpatient rehab for 4 weeks. Pt reports she has been to therapy about one time a year since the onset of her stroke for various ailments including L  THA in 2020. Pt reports she has bioness L300 which improved her drop foot on her R ankle. Pt has depended on her L LE since the stroke due to her weakness and impaired funciton on the right LE. Since the fall she has had throbbing pain in the left LE which is exacerbated with increased weightbearing activity. Pt reports she has had x rays and it shows nothing is broken but despite this she has had continued pain in the left. Pt reports pain is on the lateral aspect of her left LE up into her posterior thigh. Pt reports doctor thinks this could be from nerve related pain but will need PT in order for her insurance to cover MRI of the lumbar spine. Prior to this fall she  fell and broke her right wrist.  Pt reports she can perform any activities above the level of the wast but she is unable to bend over to complete tasks. Pt reports she has multiple wakers, 3 wheel, 4 wheel, wheelchair, standard rolling walker. Pt had brain MRI following fall and no reports of bleeding or issues.    Limitations Walking    How long can you sit comfortably? a couple hours due to low back pain    How long can you stand comfortably? 15 minutes    How long can you walk comfortably? Pt is not sure, she reports she was able to walk in and out of movies.    Currently in Pain? Yes    Pain Score 8     Pain Location Other (Comment)   left LE   Pain Orientation Left    Pain Radiating Towards left buttock    Pain Onset More than a month ago    Pain Frequency Constant    Aggravating Factors  weightbearing, pt also reports walking did not increase pain during today's session                    Walking 2 x 300 feet ambulation with 3 wheeled walker and CGA level of guard. Pt ambulated 3:40 first round, and 3:20 second round indicating improved self selected gait speed. 3 min rest between sets  1 x STS from standard chair with airex pad utilizing significant UE assistance   Pt transitioned from sit to supine with SBA Manual therapy: performed with pt in supine position Positive SLR, hamstring length on the left side in 90/90 position was approximately 45 degrees from extension 3*45 sec hamstring stretch on L with cues to allow therapist to freely movem limb to allow for proper stretch Piriformis muscular flexibility assessed and pt had mod limitations in muscular flexibility on the left 3* 45 sec pitriformis stretch on L with similar cues to relax and allw therapist to assist with proper stretch  20 x 2 sec each on L LE only sciatic nerve glide with simultaneous ankle and knee movements in order to improve nerve sensitivity.   Further therex:  1 x 6 STS from raised adjustable  table and standard walker in front of pt. Pt completed with improved efficacy compared with prior to session.  Ambulation 150 feet in 1:20,  indicating improvement in ambulation speed compared to earlier in session.                   PT Education - 06/05/21 1213     Education Details Pt educated to continue with HEP until next session. HEP will be reassessed at next session to add some stretches and  nerve glides.    Person(s) Educated Patient    Methods Explanation    Comprehension Verbalized understanding              PT Short Term Goals - 05/21/21 1244       PT SHORT TERM GOAL #1   Title Patient will be independent in home exercise program to improve strength/mobility for better functional independence with ADLs.    Baseline Pt does not have an HEP    Time 3    Period Weeks    Status New    Target Date 06/11/21      PT SHORT TERM GOAL #2   Title Pt will be able to complete 6 MWT without requiring rest break or sitting in order to indicate ipmroved aerobic and muscular endurance for everyday tasks.    Baseline Pt ambulated 300 feet in 2:56 and required rest break    Time 4    Period Weeks    Status New    Target Date 06/18/21      PT SHORT TERM GOAL #3   Title Patient will complete five times sit to stand test in < 25 seconds indicating an increased LE strength and improved balance.    Baseline 36 seconds with use of B UE    Time 4    Period Weeks    Status New    Target Date 06/18/21               PT Long Term Goals - 05/21/21 1250       PT LONG TERM GOAL #1   Title Pt will improve FOTO score to 53 in order to indicate improvement with everyday tasks    Baseline FOTO score 42    Time 10    Period Weeks    Status New    Target Date 07/30/21      PT LONG TERM GOAL #2   Title Patient will complete five times sit to stand test in < 20 seconds indicating an increased LE strength and improved balance.    Baseline 5 x STS in 36.2 seconds     Time 10    Period Weeks    Status New    Target Date 07/30/21      PT LONG TERM GOAL #3   Title Patient will increase six minute walk test distance to >900 feet for progression to improve gait ability and safety with ambulation    Baseline 300 feet in 2:56    Time 12    Period Weeks    Status New    Target Date 08/13/21                   Plan - 06/05/21 1215     Clinical Impression Statement Continued with current plan of care as laid out in evaluation and recent prior sessions. Pt remains motivated to advance progress toward goals in order to maximize independence and safety at home. Pt assessed for sciatic nerve involvement today and demonstrated positive SLR test on the left. Following interventions described in this note pt reported significnat improvements in pain levels and demonstrated improved function in her L LE with improved efficacy with STS as well as improved gait speed. Pt will continue to benefit from skilled physical therapy intervention in order to address her goals and improve her QOL.    Personal Factors and Comorbidities Age;Comorbidity 1    Examination-Activity Limitations Bend;Carry;Squat;Stairs    Examination-Participation Restrictions Cleaning;Laundry  Stability/Clinical Decision Making Evolving/Moderate complexity    Rehab Potential Good    PT Frequency 2x / week    PT Duration 12 weeks    PT Treatment/Interventions ADLs/Self Care Home Management;Aquatic Therapy;Electrical Stimulation;Gait training;Stair training;Functional mobility training;Therapeutic activities;Therapeutic exercise;Balance training;Neuromuscular re-education;Patient/family education;Prosthetic Training;Manual techniques;Passive range of motion;Energy conservation    PT Next Visit Plan Continue to progress HEP, provide written version, assess hip strength    PT Home Exercise Plan Added STS and marching to pt HEP on 05/31/21    Consulted and Agree with Plan of Care Patient              Patient will benefit from skilled therapeutic intervention in order to improve the following deficits and impairments:  Abnormal gait, Decreased balance, Decreased endurance, Decreased mobility, Decreased activity tolerance  Visit Diagnosis: Difficulty in walking, not elsewhere classified  History of falling     Problem List Patient Active Problem List   Diagnosis Date Noted   Primary localized osteoarthritis of left hip 03/30/2019   Status post left hip replacement 03/30/2019    Particia Lather 06/05/2021, 12:23 PM  [facility] [address] [place], [place], [address] Phone: [phone]   Fax:  [phone]  Name: LAKISKA FEIMSTER MRN: [id number] Date of Birth: 10-12-1946

## 2021-06-07 ENCOUNTER — Ambulatory Visit: Payer: Medicare Other | Admitting: Physical Therapy

## 2021-06-08 ENCOUNTER — Ambulatory Visit: Payer: Medicare Other | Admitting: Physical Therapy

## 2021-06-08 ENCOUNTER — Other Ambulatory Visit: Payer: Self-pay

## 2021-06-08 DIAGNOSIS — R262 Difficulty in walking, not elsewhere classified: Secondary | ICD-10-CM | POA: Diagnosis not present

## 2021-06-08 DIAGNOSIS — Z9181 History of falling: Secondary | ICD-10-CM | POA: Diagnosis not present

## 2021-06-08 NOTE — Therapy (Signed)
[place] MAIN [facility] SERVICES [address] [place], [place], [address] Phone: [phone]   Fax:  [phone]  Physical Therapy Treatment  Patient Details  Name: [name] MRN: [id number] Date of Birth: Mar 07, 1946 Referring Provider (PT): Merlene Pulling PA-C   Encounter Date: 06/08/2021   PT End of Session - 06/08/21 1112     Visit Number 6    Number of Visits 24    Date for PT Re-Evaluation 08/13/21    Authorization Type Medicare    Authorization Time Period 05/21/21-08/13/21    Progress Note Due on Visit 10    PT Start Time 1017    PT Stop Time 1102    PT Time Calculation (min) 45 min    Equipment Utilized During Treatment Gait belt    Activity Tolerance Patient tolerated treatment well;Patient limited by pain;Patient limited by fatigue;Other (comment)    Behavior During Therapy WFL for tasks assessed/performed             Past Medical History:  Diagnosis Date   Allergic rhinitis    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   Heart murmur    mild mitral regurg-echo 2010   Hyperlipidemia    Hypertension    Lupus anticoagulant positive    Obesity    Pre-diabetes    Primary localized osteoarthritis of left hip 03/30/2019   Stroke [facility]) 2010   Some right sided weakness.  Gait issues.   Wears dentures    partial upper    Past Surgical History:  Procedure Laterality Date   ABDOMINAL HYSTERECTOMY  1981   APPENDECTOMY     AXILLARY LYMPH NODE BIOPSY Left 05/16/2014   Procedure: EXCISION 2CM LEFT AXILLARY MASS;  Surgeon: [name], MD;  Location: [place];  Service: General;  Laterality: Left;  Left axillary sebaceous cyst excision   CATARACT EXTRACTION W/PHACO Left  04/11/2020   Procedure: CATARACT EXTRACTION PHACO AND INTRAOCULAR LENS PLACEMENT ([place]) LEFT DIABETIC TORIC LENS  6.05 00:34.9;  Surgeon: [name], MD;  Location: [place];  Service: Ophthalmology;  Laterality: Left;   CATARACT EXTRACTION W/PHACO Right 12/12/2020   Procedure: CATARACT EXTRACTION PHACO AND INTRAOCULAR LENS PLACEMENT ([place]) RIGHT TORIC LENS;  Surgeon: [name], MD;  Location: [place];  Service: Ophthalmology;  Laterality: Right;  6.55 0:41.1   DILATION AND CURETTAGE OF UTERUS     SKIN CANCER EXCISION  2016   [place]   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   Procedure: TOTAL HIP ARTHROPLASTY;  Surgeon: [name], MD;  [facility];  Service: Orthopedics;  Laterality: Left;    There were no vitals filed for this visit.   Subjective Assessment - 06/08/21 1110     Subjective Pt reports improvement in L LE pain since last treatment session. Reports walking around [place] for a long time and was very fatigued following.    Pertinent History Pt reports she had a stroke in 2010 and was in inpatient rehab for 4 weeks. Pt reports she has been to therapy about one time a year since the onset of her stroke for various ailments including L THA in 2020. Pt reports she has bioness L300 which improved her drop foot on her  R ankle. Pt has depended on her L LE since the stroke due to her weakness and impaired funciton on the right LE. Since the fall she has had throbbing pain in the left LE which is exacerbated with increased weightbearing activity. Pt reports she has had x rays and it shows nothing is broken but despite this she has had continued pain in the left. Pt reports pain is on the lateral aspect of her left LE up into her posterior thigh. Pt reports doctor thinks this could be from nerve related pain but will need PT in order for her insurance to cover MRI of the lumbar spine. Prior to this fall she fell and broke her right wrist.  Pt reports she can  perform any activities above the level of the wast but she is unable to bend over to complete tasks. Pt reports she has multiple wakers, 3 wheel, 4 wheel, wheelchair, standard rolling walker. Pt had brain MRI following fall and no reports of bleeding or issues.    Limitations Walking    How long can you sit comfortably? a couple hours due to low back pain    How long can you stand comfortably? 15 minutes    How long can you walk comfortably? Able to walk around [place] but was very fatigued following    Currently in Pain? Yes    Pain Score 5     Pain Location Leg    Pain Orientation Left    Pain Descriptors / Indicators Other (Comment)    Pain Type Chronic pain    Pain Radiating Towards left buttock    Pain Onset More than a month ago    Pain Frequency Constant            Treatment this session  Pt transitioned from sit to supine with SBA Manual therapy: performed with pt in supine position 3*45 sec hamstring stretch on L with cues to allow therapist to freely movem limb to allow for proper stretch Piriformis muscular flexibility assessed and pt had mod limitations in muscular flexibility on the left 3* 45 sec pitriformis stretch on L with similar cues to relax and allow therapist to assist with proper stretch  20 x 2 sec each on L LE sciatic nerve glide with simultaneous ankle and knee movements in order to improve nerve sensitivity.  STM to hamsring following pt report of "cramping" x 1 min which followed with relief of cramping   Therex:   2 x 45 sec hamstring stretch seated - on new HEP 2 x 20 with 2 sec holds seated sciatic nerve glides with knee extended, foot on floor and pt flexing and extending cetvical and thoracic spine 1 x 20 sciatic nerve glides with knee extended, seated position with pt alternating ankle flexin and DF - on new HEP Pt declined to perform piriformis stretch in seated position due to hip replacement on the same side.   There act:  5 x STS with min  Use of UE from elevated mat table, on last attempt pt able to complete without L UE per cues.  300 feet of ambulation with 3 wheeled walker with no increase in pain or discomfort on the left side. CGA -Pt has tendency to slightly turn walker toward left in left side stance phase of gait but is quick to correct  PT Education - 06/08/21 1111     Education Details Pt provided updated HEP including sciatic nerve glide and hamstring stretch    Person(s) Educated Patient    Methods Explanation;Handout    Comprehension Verbalized understanding;Returned demonstration              PT Short Term Goals - 05/21/21 1244       PT SHORT TERM GOAL #1   Title Patient will be independent in home exercise program to improve strength/mobility for better functional independence with ADLs.    Baseline Pt does not have an HEP    Time 3    Period Weeks    Status New    Target Date 06/11/21      PT SHORT TERM GOAL #2   Title Pt will be able to complete 6 MWT without requiring rest break or sitting in order to indicate ipmroved aerobic and muscular endurance for everyday tasks.    Baseline Pt ambulated 300 feet in 2:56 and required rest break    Time 4    Period Weeks    Status New    Target Date 06/18/21      PT SHORT TERM GOAL #3   Title Patient will complete five times sit to stand test in < 25 seconds indicating an increased LE strength and improved balance.    Baseline 36 seconds with use of B UE    Time 4    Period Weeks    Status New    Target Date 06/18/21               PT Long Term Goals - 05/21/21 1250       PT LONG TERM GOAL #1   Title Pt will improve FOTO score to 53 in order to indicate improvement with everyday tasks    Baseline FOTO score 42    Time 10    Period Weeks    Status New    Target Date 07/30/21      PT LONG TERM GOAL #2   Title Patient will complete five times sit to stand test in < 20 seconds  indicating an increased LE strength and improved balance.    Baseline 5 x STS in 36.2 seconds    Time 10    Period Weeks    Status New    Target Date 07/30/21      PT LONG TERM GOAL #3   Title Patient will increase six minute walk test distance to >900 feet for progression to improve gait ability and safety with ambulation    Baseline 300 feet in 2:56    Time 12    Period Weeks    Status New    Target Date 08/13/21                   Plan - 06/08/21 1113     Clinical Impression Statement Continued with current plan of care as laid out in evaluation and recent prior sessions. Pt remains motivated to advance progress toward goals in order to maximize independence and safety at home.  Following interventions described in this note pt reported significnat improvements in pain levels and demonstrated improved function in her L LE with improved efficacy with STS as well as improved gait tolerance. Pt will continue to benefit from skilled physical therapy intervention in order to address her goals and improve her QOL.    Personal Factors and Comorbidities Age;Comorbidity 1    Examination-Activity Limitations Bend;Carry;Squat;Stairs  Examination-Participation Restrictions Cleaning;Laundry    Stability/Clinical Decision Making Evolving/Moderate complexity    Clinical Decision Making Moderate    Rehab Potential Good    PT Frequency 2x / week    PT Duration 12 weeks    PT Treatment/Interventions ADLs/Self Care Home Management;Aquatic Therapy;Electrical Stimulation;Gait training;Stair training;Functional mobility training;Therapeutic activities;Therapeutic exercise;Balance training;Neuromuscular re-education;Patient/family education;Prosthetic Training;Manual techniques;Passive range of motion;Energy conservation    PT Next Visit Plan Continue to progress HEP, provide written version, assess hip strength    PT Home Exercise Plan Added STS and marching to pt HEP on 05/31/21    Consulted  and Agree with Plan of Care Patient             Patient will benefit from skilled therapeutic intervention in order to improve the following deficits and impairments:  Abnormal gait, Decreased balance, Decreased endurance, Decreased mobility, Decreased activity tolerance  Visit Diagnosis: Difficulty in walking, not elsewhere classified  History of falling     Problem List Patient Active Problem List   Diagnosis Date Noted   Primary localized osteoarthritis of left hip 03/30/2019   Status post left hip replacement 03/30/2019   Rivka Barbara PT, DPT   Particia Lather 06/08/2021, 11:15 AM  [facility] [address] [place], [place], [address] Phone: [phone]   Fax:  [phone]  Name: LAVAE ARCIDIACONO MRN: [id number] Date of Birth: 1946-06-24

## 2021-06-11 ENCOUNTER — Other Ambulatory Visit: Payer: Self-pay

## 2021-06-11 ENCOUNTER — Ambulatory Visit: Payer: Medicare Other | Admitting: Physical Therapy

## 2021-06-11 VITALS — BP 138/65

## 2021-06-11 DIAGNOSIS — R262 Difficulty in walking, not elsewhere classified: Secondary | ICD-10-CM

## 2021-06-11 DIAGNOSIS — Z9181 History of falling: Secondary | ICD-10-CM | POA: Diagnosis not present

## 2021-06-11 NOTE — Therapy (Signed)
[place] MAIN [facility] SERVICES [address] [place], [place], [address] Phone: [phone]   Fax:  [phone]  Physical Therapy Treatment  Patient Details  Name: [name] MRN: [id number] Date of Birth: August 09, 1946 Referring Provider (PT): Merlene Pulling PA-C   Encounter Date: 06/11/2021   PT End of Session - 06/11/21 1322     Visit Number 7    Number of Visits 24    Date for PT Re-Evaluation 08/13/21    Authorization Type Medicare    Authorization Time Period 05/21/21-08/13/21    Progress Note Due on Visit 10    PT Start Time 1111    PT Stop Time 1144    PT Time Calculation (min) 33 min    Equipment Utilized During Treatment Gait belt    Activity Tolerance Patient tolerated treatment well;Patient limited by pain;Patient limited by fatigue;Other (comment)    Behavior During Therapy WFL for tasks assessed/performed             Past Medical History:  Diagnosis Date   Allergic rhinitis    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   Heart murmur    mild mitral regurg-echo 2010   Hyperlipidemia    Hypertension    Lupus anticoagulant positive    Obesity    Pre-diabetes    Primary localized osteoarthritis of left hip 03/30/2019   Stroke [facility]) 2010   Some right sided weakness.  Gait issues.   Wears dentures    partial upper    Past Surgical History:  Procedure Laterality Date   ABDOMINAL HYSTERECTOMY  1981   APPENDECTOMY     AXILLARY LYMPH NODE BIOPSY Left 05/16/2014   Procedure: EXCISION 2CM LEFT AXILLARY MASS;  Surgeon: [name], MD;  Location: [facility];  Service: General;  Laterality: Left;  Left axillary sebaceous cyst excision   CATARACT EXTRACTION W/PHACO Left  04/11/2020   Procedure: CATARACT EXTRACTION PHACO AND INTRAOCULAR LENS PLACEMENT ([place]) LEFT DIABETIC TORIC LENS  6.05 00:34.9;  Surgeon: [name], MD;  Location: [facility];  Service: Ophthalmology;  Laterality: Left;   CATARACT EXTRACTION W/PHACO Right 12/12/2020   Procedure: CATARACT EXTRACTION PHACO AND INTRAOCULAR LENS PLACEMENT ([place]) RIGHT TORIC LENS;  Surgeon: [name], MD;  Location: [facility];  Service: Ophthalmology;  Laterality: Right;  6.55 0:41.1   DILATION AND CURETTAGE OF UTERUS     SKIN CANCER EXCISION  2016   [facility]   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   Procedure: TOTAL HIP ARTHROPLASTY;  Surgeon: [name], MD;  Location[facility];  Service: Orthopedics;  Laterality: Left;    Vitals:   06/11/21 1140  BP: 138/65     Subjective Assessment - 06/11/21 1117     Subjective Pt reports she had some muscle spasms on her way to therapy today. She also report sshe is a little shaky and requested to have some of the crackers from her rollator. Pt was provided with 5 minutes of time following this.    Pertinent History Pt reports she had a stroke in 2010 and was in inpatient rehab for 4 weeks. Pt reports she has been to therapy about one time a year since the onset of her stroke for  various ailments including L THA in 2020. Pt reports she has bioness L300 which improved her drop foot on her R ankle. Pt has depended on her L LE since the stroke due to her weakness and impaired funciton on the right LE. Since the fall she has had throbbing pain in the left LE which is exacerbated with increased weightbearing activity. Pt reports she has had x rays and it shows nothing is broken but despite this she has had continued pain in the left. Pt reports pain is on the lateral aspect of her left LE up into her posterior thigh. Pt reports doctor thinks this could be from nerve related pain but will need PT in order for her insurance to cover MRI of the lumbar spine.  Prior to this fall she fell and broke her right wrist.  Pt reports she can perform any activities above the level of the wast but she is unable to bend over to complete tasks. Pt reports she has multiple wakers, 3 wheel, 4 wheel, wheelchair, standard rolling walker. Pt had brain MRI following fall and no reports of bleeding or issues.    Limitations Walking    How long can you sit comfortably? a couple hours due to low back pain    How long can you stand comfortably? 15 minutes    How long can you walk comfortably? Able to walk around [employer] but was very fatigued following    Currently in Pain? Yes             Treatment this session   Pt transitioned from sit to supine with SBA Manual therapy: performed with pt in supine position 3*45 sec hamstring stretch on L with cues to allow therapist to freely movem limb to allow for proper stretch Piriformis muscular flexibility assessed and pt had mod limitations in muscular flexibility on the left 3* 45 sec pitriformis stretch on L with similar cues to relax and allow therapist to assist with proper stretch  20 x 2 sec each on L LE sciatic nerve glide with simultaneous ankle and knee movements in order to improve nerve sensitivity.   - Pt reports some discomfort with this but it was "good pain"   Pt had difficulty with transition from supine to sit and required min assistance from PT to ensure pt did not fall posteriorly onto mat table when attempting to sit up.   Ambulation:  2*100 feet walking, prior to manual therapy pt had 8/10 pain and following manual therapy pt had 5/10 pain with ambulation.   2 x 30 sec supine piriformis stretch, pt required multiple cues for proper performance, and still did hold for proper amount of time when she completed correctly.                           PT Education - 06/11/21 1321     Education Details Pt educated to continue to monitor her blood glucose and blood pressure     Person(s) Educated Patient    Methods Explanation    Comprehension Verbalized understanding              PT Short Term Goals - 05/21/21 1244       PT SHORT TERM GOAL #1   Title Patient will be independent in home exercise program to improve strength/mobility for better functional independence with ADLs.    Baseline Pt does not have an HEP    Time 3  Period Weeks    Status New    Target Date 06/11/21      PT SHORT TERM GOAL #2   Title Pt will be able to complete 6 MWT without requiring rest break or sitting in order to indicate ipmroved aerobic and muscular endurance for everyday tasks.    Baseline Pt ambulated 300 feet in 2:56 and required rest break    Time 4    Period Weeks    Status New    Target Date 06/18/21      PT SHORT TERM GOAL #3   Title Patient will complete five times sit to stand test in < 25 seconds indicating an increased LE strength and improved balance.    Baseline 36 seconds with use of B UE    Time 4    Period Weeks    Status New    Target Date 06/18/21               PT Long Term Goals - 05/21/21 1250       PT LONG TERM GOAL #1   Title Pt will improve FOTO score to 53 in order to indicate improvement with everyday tasks    Baseline FOTO score 42    Time 10    Period Weeks    Status New    Target Date 07/30/21      PT LONG TERM GOAL #2   Title Patient will complete five times sit to stand test in < 20 seconds indicating an increased LE strength and improved balance.    Baseline 5 x STS in 36.2 seconds    Time 10    Period Weeks    Status New    Target Date 07/30/21      PT LONG TERM GOAL #3   Title Patient will increase six minute walk test distance to >900 feet for progression to improve gait ability and safety with ambulation    Baseline 300 feet in 2:56    Time 12    Period Weeks    Status New    Target Date 08/13/21                   Plan - 06/11/21 1323     Clinical Impression Statement Continued with current  plan of care as laid out in evaluation and recent prior sessions. Pt remains motivated to advance progress toward goals in order to maximize independence and safety at home. Treatmet was modified to day due to pt's late arrival to appointment as well as her c/o muscle spasms in her LE. Pt also had to be monitored due to her subjective feeling of"shakiness" as pt was allowed to eat crackers and time was alotted for her to feel better before beginning formal treatment. Pt will continue to benefit from skilled physcial therpay in order to address her goals and improve QOL.    Personal Factors and Comorbidities Age;Comorbidity 1    Examination-Activity Limitations Bend;Carry;Squat;Stairs    Examination-Participation Restrictions Cleaning;Laundry    Stability/Clinical Decision Making Evolving/Moderate complexity    Clinical Decision Making Moderate    Rehab Potential Good    PT Frequency 2x / week    PT Duration 12 weeks    PT Treatment/Interventions ADLs/Self Care Home Management;Aquatic Therapy;Electrical Stimulation;Gait training;Stair training;Functional mobility training;Therapeutic activities;Therapeutic exercise;Balance training;Neuromuscular re-education;Patient/family education;Prosthetic Training;Manual techniques;Passive range of motion;Energy conservation    PT Next Visit Plan Continue to progress HEPas indicated    PT Home Exercise Plan No updates to HEP this  date    Consulted and Agree with Plan of Care Patient             Patient will benefit from skilled therapeutic intervention in order to improve the following deficits and impairments:  Abnormal gait, Decreased balance, Decreased endurance, Decreased mobility, Decreased activity tolerance  Visit Diagnosis: Difficulty in walking, not elsewhere classified  History of falling     Problem List Patient Active Problem List   Diagnosis Date Noted   Primary localized osteoarthritis of left hip 03/30/2019   Status post left hip  replacement 03/30/2019   [name] PT, DPT   [name] 06/11/2021, 1:28 PM  [facility] [address] [place], [place], [address] Phone: [phone]   Fax:  [phone]  Name: [name] MRN: [id number] Date of Birth: Jun 23, 1946

## 2021-06-13 ENCOUNTER — Other Ambulatory Visit: Payer: Self-pay

## 2021-06-13 ENCOUNTER — Ambulatory Visit: Payer: Medicare Other | Admitting: Physical Therapy

## 2021-06-13 DIAGNOSIS — R262 Difficulty in walking, not elsewhere classified: Secondary | ICD-10-CM | POA: Diagnosis not present

## 2021-06-13 DIAGNOSIS — Z9181 History of falling: Secondary | ICD-10-CM

## 2021-06-13 NOTE — Therapy (Signed)
[place] MAIN [facility] SERVICES [address] [place], [place], [address] Phone: [phone]   Fax:  [phone]  Physical Therapy Treatment  Patient Details  Name: [name] MRN: [id number] Date of Birth: 09/08/1946 Referring Provider (PT): Merlene Pulling PA-C   Encounter Date: 06/13/2021   PT End of Session - 06/13/21 1018     Visit Number 8    Number of Visits 24    Date for PT Re-Evaluation 08/13/21    Authorization Type Medicare    Authorization Time Period 05/21/21-08/13/21    Progress Note Due on Visit 10    PT Start Time 0921    PT Stop Time 1010    PT Time Calculation (min) 49 min    Equipment Utilized During Treatment Gait belt    Activity Tolerance Patient tolerated treatment well;Patient limited by pain;Patient limited by fatigue;Other (comment)    Behavior During Therapy WFL for tasks assessed/performed             Past Medical History:  Diagnosis Date   Allergic rhinitis    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   Heart murmur    mild mitral regurg-echo 2010   Hyperlipidemia    Hypertension    Lupus anticoagulant positive    Obesity    Pre-diabetes    Primary localized osteoarthritis of left hip 03/30/2019   Stroke [facility]) 2010   Some right sided weakness.  Gait issues.   Wears dentures    partial upper    Past Surgical History:  Procedure Laterality Date   ABDOMINAL HYSTERECTOMY  1981   APPENDECTOMY     AXILLARY LYMPH NODE BIOPSY Left 05/16/2014   Procedure: EXCISION 2CM LEFT AXILLARY MASS;  Surgeon: [name], MD;  Location: [facility];  Service: General;  Laterality: Left;  Left axillary sebaceous cyst excision   CATARACT EXTRACTION W/PHACO Left  04/11/2020   Procedure: CATARACT EXTRACTION PHACO AND INTRAOCULAR LENS PLACEMENT ([place]) LEFT DIABETIC TORIC LENS  6.05 00:34.9;  Surgeon: [name], MD;  Location: [place];  Service: Ophthalmology;  Laterality: Left;   CATARACT EXTRACTION W/PHACO Right 12/12/2020   Procedure: CATARACT EXTRACTION PHACO AND INTRAOCULAR LENS PLACEMENT ([place]) RIGHT TORIC LENS;  Surgeon: [name], MD;  Location: [facility];  Service: Ophthalmology;  Laterality: Right;  6.55 0:41.1   DILATION AND CURETTAGE OF UTERUS     SKIN CANCER EXCISION  2016   [facility]   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   Procedure: TOTAL HIP ARTHROPLASTY;  Surgeon: [name], MD;  [facility];  Service: Orthopedics;  Laterality: Left;    There were no vitals filed for this visit.   Subjective Assessment - 06/13/21 0923     Subjective Pt reports she is feeling better today compared to her previous session. She reports her Pain in her left leg is about a 6/10 and is localized to the area near her lateral maleolus on her left side.    Pertinent History Pt reports she had a stroke in 2010 and was in inpatient rehab for 4 weeks. Pt reports she has been to therapy about one time a year since the onset of her stroke for various ailments including L THA in  2020. Pt reports she has bioness L300 which improved her drop foot on her R ankle. Pt has depended on her L LE since the stroke due to her weakness and impaired funciton on the right LE. Since the fall she has had throbbing pain in the left LE which is exacerbated with increased weightbearing activity. Pt reports she has had x rays and it shows nothing is broken but despite this she has had continued pain in the left. Pt reports pain is on the lateral aspect of her left LE up into her posterior thigh. Pt reports doctor thinks this could be from nerve related pain but will need PT in order for her insurance to cover MRI of the lumbar spine. Prior to this fall  she fell and broke her right wrist.  Pt reports she can perform any activities above the level of the wast but she is unable to bend over to complete tasks. Pt reports she has multiple wakers, 3 wheel, 4 wheel, wheelchair, standard rolling walker. Pt had brain MRI following fall and no reports of bleeding or issues.    Limitations Walking    How long can you sit comfortably? a couple hours due to low back pain    How long can you stand comfortably? 15 minutes    How long can you walk comfortably? Able to walk around [employer] but was very fatigued following    Currently in Pain? Yes    Pain Score 6     Pain Location Leg    Pain Orientation Left    Pain Descriptors / Indicators Throbbing    Pain Type Chronic pain    Pain Onset More than a month ago    Pain Frequency Constant               Seated hor hip ABD 2 x 10 GTB  Ball adductor squeexe 2 x 10 x 2 sec holds  STS 2 x 4 with UE use from standard chair with Rolator anterior -attempted utlilizing airex pad in standard chair but with pt niones system this made getting R LE in concact with ground more difficulty.  - Cues for propr hand placement without relying on rollator for pushing/ pulling, following cues pt form improved significantly and it also improved her efficacy.   min A for bed positioning transitioning from sit to supine.   Manual therapy: performed with pt in supine position 3*45 sec hamstring stretch on L with cues to allow therapist to freely movem limb to allow for proper stretch Piriformis muscular flexibility assessed and pt had mod limitations in muscular flexibility on the left 3* 45 sec pitriformis stretch on L with similar cues to relax and allow therapist to assist with proper stretch  20 x 2 sec each on L LE sciatic nerve glides Pt noted significant improvement In LE pain following manual therapy  Walking x 4 min with 3 wheeled Rolator, rest break required at end of 4 minutes due to increased respiratory  rate  The following activities were completed in parallel bars or at balance bar   Step up leading with L to airex pad and maintaining balance x 5 sec x 10 repetitions , CGA and B HH utilized, when standing on airex pt was instructed to let go of parallel bars to establish and maintain balance. No noted LOB during exercise.  PT Education - 06/13/21 0927     Education Details Pt educated regarding HEP    Person(s) Educated Patient    Methods Explanation    Comprehension Verbalized understanding              PT Short Term Goals - 05/21/21 1244       PT SHORT TERM GOAL #1   Title Patient will be independent in home exercise program to improve strength/mobility for better functional independence with ADLs.    Baseline Pt does not have an HEP    Time 3    Period Weeks    Status New    Target Date 06/11/21      PT SHORT TERM GOAL #2   Title Pt will be able to complete 6 MWT without requiring rest break or sitting in order to indicate ipmroved aerobic and muscular endurance for everyday tasks.    Baseline Pt ambulated 300 feet in 2:56 and required rest break    Time 4    Period Weeks    Status New    Target Date 06/18/21      PT SHORT TERM GOAL #3   Title Patient will complete five times sit to stand test in < 25 seconds indicating an increased LE strength and improved balance.    Baseline 36 seconds with use of B UE    Time 4    Period Weeks    Status New    Target Date 06/18/21               PT Long Term Goals - 05/21/21 1250       PT LONG TERM GOAL #1   Title Pt will improve FOTO score to 53 in order to indicate improvement with everyday tasks    Baseline FOTO score 42    Time 10    Period Weeks    Status New    Target Date 07/30/21      PT LONG TERM GOAL #2   Title Patient will complete five times sit to stand test in < 20 seconds indicating an increased LE strength and improved balance.    Baseline 5 x  STS in 36.2 seconds    Time 10    Period Weeks    Status New    Target Date 07/30/21      PT LONG TERM GOAL #3   Title Patient will increase six minute walk test distance to >900 feet for progression to improve gait ability and safety with ambulation    Baseline 300 feet in 2:56    Time 12    Period Weeks    Status New    Target Date 08/13/21                   Plan - 06/13/21 1019     Clinical Impression Statement Continued with current plan of care as laid out in evaluation and recent prior sessions. Pt remains motivated to advance progress toward goals in order to maximize independence and safety at home. Pt demonstrated improved LE pain and no c/o shakiness or spasms this session indicating marked improvement from last session.    Personal Factors and Comorbidities Age;Comorbidity 1    Examination-Activity Limitations Bend;Carry;Squat;Stairs    Examination-Participation Restrictions Cleaning;Laundry    Stability/Clinical Decision Making Evolving/Moderate complexity    Clinical Decision Making Moderate    Rehab Potential Good    PT Frequency 2x / week    PT Duration 12 weeks  PT Treatment/Interventions ADLs/Self Care Home Management;Aquatic Therapy;Electrical Stimulation;Gait training;Stair training;Functional mobility training;Therapeutic activities;Therapeutic exercise;Balance training;Neuromuscular re-education;Patient/family education;Prosthetic Training;Manual techniques;Passive range of motion;Energy conservation    PT Next Visit Plan Provide condensed HEP    PT Home Exercise Plan No updates to HEP this date    Consulted and Agree with Plan of Care Patient             Patient will benefit from skilled therapeutic intervention in order to improve the following deficits and impairments:  Abnormal gait, Decreased balance, Decreased endurance, Decreased mobility, Decreased activity tolerance  Visit Diagnosis: Difficulty in walking, not elsewhere  classified  History of falling     Problem List Patient Active Problem List   Diagnosis Date Noted   Primary localized osteoarthritis of left hip 03/30/2019   Status post left hip replacement 03/30/2019   Rivka Barbara PT, DPT   Particia Lather 06/13/2021, 10:26 AM  [facility] [address] [place], [place], [address] Phone: [phone]   Fax:  [phone]  Name: [name] MRN: [id number] Date of Birth: 01/17/1946

## 2021-06-19 ENCOUNTER — Other Ambulatory Visit: Payer: Self-pay

## 2021-06-19 ENCOUNTER — Ambulatory Visit: Payer: Medicare Other | Attending: Surgical | Admitting: Physical Therapy

## 2021-06-19 DIAGNOSIS — Z9181 History of falling: Secondary | ICD-10-CM | POA: Diagnosis not present

## 2021-06-19 DIAGNOSIS — R262 Difficulty in walking, not elsewhere classified: Secondary | ICD-10-CM | POA: Diagnosis not present

## 2021-06-19 DIAGNOSIS — M79662 Pain in left lower leg: Secondary | ICD-10-CM | POA: Diagnosis not present

## 2021-06-19 NOTE — Therapy (Signed)
[place] MAIN [facility] SERVICES [address] [place], [place], [address] Phone: [phone]   Fax:  [phone]  Physical Therapy Treatment  Patient Details  Name: [name] MRN: [id number] Date of Birth: May 27, 1946 Referring Provider (PT): Merlene Pulling PA-C   Encounter Date: 06/19/2021   PT End of Session - 06/19/21 1155     Visit Number 9    Number of Visits 24    Date for PT Re-Evaluation 08/13/21    Authorization Type Medicare    Authorization Time Period 05/21/21-08/13/21    Progress Note Due on Visit 10    PT Start Time 1021    PT Stop Time 1100    PT Time Calculation (min) 39 min    Equipment Utilized During Treatment Gait belt    Activity Tolerance Patient tolerated treatment well;Patient limited by pain;Patient limited by fatigue;Other (comment)    Behavior During Therapy WFL for tasks assessed/performed             Past Medical History:  Diagnosis Date   Allergic rhinitis    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   Heart murmur    mild mitral regurg-echo 2010   Hyperlipidemia    Hypertension    Lupus anticoagulant positive    Obesity    Pre-diabetes    Primary localized osteoarthritis of left hip 03/30/2019   Stroke [facility]) 2010   Some right sided weakness.  Gait issues.   Wears dentures    partial upper    Past Surgical History:  Procedure Laterality Date   ABDOMINAL HYSTERECTOMY  1981   APPENDECTOMY     AXILLARY LYMPH NODE BIOPSY Left 05/16/2014   Procedure: EXCISION 2CM LEFT AXILLARY MASS;  Surgeon: [name], MD;  Location: [place];  Service: General;  Laterality: Left;  Left axillary sebaceous cyst excision   CATARACT EXTRACTION W/PHACO Left 04/11/2020    Procedure: CATARACT EXTRACTION PHACO AND INTRAOCULAR LENS PLACEMENT ([place]) LEFT DIABETIC TORIC LENS  6.05 00:34.9;  Surgeon: [name], MD;  Location: [place];  Service: Ophthalmology;  Laterality: Left;   CATARACT EXTRACTION W/PHACO Right 12/12/2020   Procedure: CATARACT EXTRACTION PHACO AND INTRAOCULAR LENS PLACEMENT ([place]) RIGHT TORIC LENS;  Surgeon: [name], MD;  Location: [place];  Service: Ophthalmology;  Laterality: Right;  6.55 0:41.1   DILATION AND CURETTAGE OF UTERUS     SKIN CANCER EXCISION  2016   [place]   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   Procedure: TOTAL HIP ARTHROPLASTY;  Surgeon: [name], MD;  [facility];  Service: Orthopedics;  Laterality: Left;    There were no vitals filed for this visit.   Subjective Assessment - 06/19/21 1025     Subjective Pt reports she is continuing to have pain in the left LE. Pt reports stretching has helped with her pain minimally. Pt reports no significant changes since her previous sesison.    Pertinent History Pt reports she had a stroke in 2010 and was in inpatient rehab for 4 weeks. Pt reports she has been to therapy about one time a year since the onset of her stroke for various ailments including L THA in 2020. Pt reports she has bioness L300 which  improved her drop foot on her R ankle. Pt has depended on her L LE since the stroke due to her weakness and impaired funciton on the right LE. Since the fall she has had throbbing pain in the left LE which is exacerbated with increased weightbearing activity. Pt reports she has had x rays and it shows nothing is broken but despite this she has had continued pain in the left. Pt reports pain is on the lateral aspect of her left LE up into her posterior thigh. Pt reports doctor thinks this could be from nerve related pain but will need PT in order for her insurance to cover MRI of the lumbar spine. Prior to this fall she fell and broke her right  wrist.  Pt reports she can perform any activities above the level of the wast but she is unable to bend over to complete tasks. Pt reports she has multiple wakers, 3 wheel, 4 wheel, wheelchair, standard rolling walker. Pt had brain MRI following fall and no reports of bleeding or issues.    Limitations Walking    How long can you sit comfortably? a couple hours due to low back pain    How long can you stand comfortably? 15 minutes    How long can you walk comfortably? Able to walk around [employer] but was very fatigued following    Currently in Pain? Yes    Pain Score 5     Pain Location Leg    Pain Orientation Left    Pain Descriptors / Indicators Throbbing    Pain Type Chronic pain    Pain Onset More than a month ago    Pain Frequency Constant    Aggravating Factors  weightbearing               Treatment provided this session  therex:  Supine clam 2 x 10 GTB  Ball adductor squeexe 2 x 10 x 2 sec holds  STS 2 x 6 with walking in between sets  - Cues for propr hand placement without relying on rollator for pushing/ pulling, following cues pt form improved significantly and it also improved her efficacy.  min A for bed positioning transitioning from sit to supine.  Manual   Manual therapy: performed with pt in supine position 3*45 sec hamstring stretch on L with cues to allow therapist to freely movem limb to allow for proper stretch Piriformis muscular flexibility assessed and pt had mod limitations in muscular flexibility on the left 3* 45 sec pitriformis stretch on L with similar cues to relax and allow therapist to assist with proper stretch  20 x 2 sec each on L LE sciatic n  Therex: Walking 3:20, 3:40 min x 350 feet with 3 wheeled rollator (2 trials, slight veering to left with R LE initiation of stance phase.)                         PT Education - 06/19/21 1154     Education Details Pt educacted regarding progress note plan next visit.     Person(s) Educated Patient    Methods Explanation    Comprehension Verbalized understanding              PT Short Term Goals - 05/21/21 1244       PT SHORT TERM GOAL #1   Title Patient will be independent in home exercise program to improve strength/mobility for better functional independence with ADLs.    Baseline  Pt does not have an HEP    Time 3    Period Weeks    Status New    Target Date 06/11/21      PT SHORT TERM GOAL #2   Title Pt will be able to complete 6 MWT without requiring rest break or sitting in order to indicate ipmroved aerobic and muscular endurance for everyday tasks.    Baseline Pt ambulated 300 feet in 2:56 and required rest break    Time 4    Period Weeks    Status New    Target Date 06/18/21      PT SHORT TERM GOAL #3   Title Patient will complete five times sit to stand test in < 25 seconds indicating an increased LE strength and improved balance.    Baseline 36 seconds with use of B UE    Time 4    Period Weeks    Status New    Target Date 06/18/21               PT Long Term Goals - 05/21/21 1250       PT LONG TERM GOAL #1   Title Pt will improve FOTO score to 53 in order to indicate improvement with everyday tasks    Baseline FOTO score 42    Time 10    Period Weeks    Status New    Target Date 07/30/21      PT LONG TERM GOAL #2   Title Patient will complete five times sit to stand test in < 20 seconds indicating an increased LE strength and improved balance.    Baseline 5 x STS in 36.2 seconds    Time 10    Period Weeks    Status New    Target Date 07/30/21      PT LONG TERM GOAL #3   Title Patient will increase six minute walk test distance to >900 feet for progression to improve gait ability and safety with ambulation    Baseline 300 feet in 2:56    Time 12    Period Weeks    Status New    Target Date 08/13/21                   Plan - 06/19/21 1255     Clinical Impression Statement Continued with current  plan of care as laid out in evaluation and recent prior sessions. Pt remains motivated to advance progress toward goals in order to maximize independence and safety at home. Pt continues to have difficulty with transitional movements such as STS and Sit to and from supine. Pt is showing improvement in ambulation distance and time and has improvements in pain with ambulation following stretching and other exercises.    Personal Factors and Comorbidities Age;Comorbidity 1    Examination-Activity Limitations Bend;Carry;Squat;Stairs    Examination-Participation Restrictions Cleaning;Laundry    Stability/Clinical Decision Making Evolving/Moderate complexity    Clinical Decision Making Moderate    Rehab Potential Good    PT Frequency 2x / week    PT Duration 12 weeks    PT Treatment/Interventions ADLs/Self Care Home Management;Aquatic Therapy;Electrical Stimulation;Gait training;Stair training;Functional mobility training;Therapeutic activities;Therapeutic exercise;Balance training;Neuromuscular re-education;Patient/family education;Prosthetic Training;Manual techniques;Passive range of motion;Energy conservation    PT Next Visit Plan Progress note    PT Home Exercise Plan No updates to HEP this date    Consulted and Agree with Plan of Care Patient  Patient will benefit from skilled therapeutic intervention in order to improve the following deficits and impairments:  Abnormal gait, Decreased balance, Decreased endurance, Decreased mobility, Decreased activity tolerance  Visit Diagnosis: Difficulty in walking, not elsewhere classified  History of falling     Problem List Patient Active Problem List   Diagnosis Date Noted   Primary localized osteoarthritis of left hip 03/30/2019   Status post left hip replacement 03/30/2019   Rivka Barbara PT, DPT   Particia Lather 06/19/2021, 1:02 PM  [place] MAIN [facility] SERVICES [address]  [place], [place], [address] Phone: [phone]   Fax:  [phone]  Name: [name] MRN: [id number] Date of Birth: 1946-08-26

## 2021-06-21 ENCOUNTER — Ambulatory Visit: Payer: Medicare Other | Admitting: Physical Therapy

## 2021-06-21 ENCOUNTER — Other Ambulatory Visit: Payer: Self-pay

## 2021-06-21 ENCOUNTER — Encounter: Payer: Self-pay | Admitting: Physical Therapy

## 2021-06-21 DIAGNOSIS — R262 Difficulty in walking, not elsewhere classified: Secondary | ICD-10-CM | POA: Diagnosis not present

## 2021-06-21 DIAGNOSIS — Z9181 History of falling: Secondary | ICD-10-CM

## 2021-06-21 DIAGNOSIS — M79662 Pain in left lower leg: Secondary | ICD-10-CM | POA: Diagnosis not present

## 2021-06-21 NOTE — Therapy (Signed)
[place] MAIN [facility] SERVICES [address] [place], [place], [address] Phone: [phone]   Fax:  [phone]  Physical Therapy Treatment/ Physical Therapy Progress Note   Dates of reporting period  05/21/21   to   06/21/21   Patient Details  Name: [name] MRN: [id number] Date of Birth: 02/19/1946 Referring Provider (PT): Merlene Pulling PA-C   Encounter Date: 06/21/2021   PT End of Session - 06/21/21 1118     Visit Number 10    Number of Visits 24    Date for PT Re-Evaluation 08/13/21    Authorization Type Medicare    Authorization Time Period 05/21/21-08/13/21    Progress Note Due on Visit 20    PT Start Time 1018    PT Stop Time 1102    PT Time Calculation (min) 44 min    Equipment Utilized During Treatment Gait belt    Activity Tolerance Patient tolerated treatment well;Other (comment)    Behavior During Therapy WFL for tasks assessed/performed             Past Medical History:  Diagnosis Date   Allergic rhinitis    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   Heart murmur    mild mitral regurg-echo 2010   Hyperlipidemia    Hypertension    Lupus anticoagulant positive    Obesity    Pre-diabetes    Primary localized osteoarthritis of left hip 03/30/2019   Stroke [facility]) 2010   Some right sided weakness.  Gait issues.   Wears dentures    partial upper    Past Surgical History:  Procedure Laterality Date   ABDOMINAL HYSTERECTOMY  1981   APPENDECTOMY     AXILLARY LYMPH NODE BIOPSY Left 05/16/2014   Procedure: EXCISION 2CM LEFT AXILLARY MASS;  Surgeon: [name], MD;  Location: [place];  Service: General;  Laterality: Left;  Left axillary sebaceous cyst excision    CATARACT EXTRACTION W/PHACO Left 04/11/2020   Procedure: CATARACT EXTRACTION PHACO AND INTRAOCULAR LENS PLACEMENT ([place]) LEFT DIABETIC TORIC LENS  6.05 00:34.9;  Surgeon: [name], MD;  Location: [place];  Service: Ophthalmology;  Laterality: Left;   CATARACT EXTRACTION W/PHACO Right 12/12/2020   Procedure: CATARACT EXTRACTION PHACO AND INTRAOCULAR LENS PLACEMENT ([place]) RIGHT TORIC LENS;  Surgeon: [name], MD;  Location: [place];  Service: Ophthalmology;  Laterality: Right;  6.55 0:41.1   DILATION AND CURETTAGE OF UTERUS     SKIN CANCER EXCISION  2016   [place]   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   Procedure: TOTAL HIP ARTHROPLASTY;  Surgeon: [name], MD;  [facility];  Service: Orthopedics;  Laterality: Left;    There were no vitals filed for this visit.   Subjective Assessment - 06/21/21 1029     Subjective Pt reports imrovements in her ability to stand with improved efficacy. SHe has noted when she is in the kitchen she is able to move around without use of rollator minimally.    Pertinent History Pt reports she had a stroke in 2010 and was in inpatient rehab for 4 weeks. Pt reports she has been to therapy about one time a year since the onset of her stroke for  various ailments including L THA in 2020. Pt reports she has bioness L300 which improved her drop foot on her R ankle. Pt has depended on her L LE since the stroke due to her weakness and impaired funciton on the right LE. Since the fall she has had throbbing pain in the left LE which is exacerbated with increased weightbearing activity. Pt reports she has had x rays and it shows nothing is broken but despite this she has had continued pain in the left. Pt reports pain is on the lateral aspect of her left LE up into her posterior thigh. Pt reports doctor thinks this could be from nerve related pain but will need PT in order for her insurance to cover MRI of the lumbar spine. Prior to this  fall she fell and broke her right wrist.  Pt reports she can perform any activities above the level of the wast but she is unable to bend over to complete tasks. Pt reports she has multiple wakers, 3 wheel, 4 wheel, wheelchair, standard rolling walker. Pt had brain MRI following fall and no reports of bleeding or issues.    Limitations Walking    How long can you sit comfortably? a couple hours due to low back pain    How long can you stand comfortably? 15 minutes    How long can you walk comfortably? Able to walk around [employer] but was very fatigued following    Currently in Pain? Yes    Pain Score 5     Pain Location Leg    Pain Orientation Left    Pain Descriptors / Indicators Throbbing    Pain Type Chronic pain    Pain Onset More than a month ago    Pain Frequency Constant    Aggravating Factors  stretching of hamstrings per HEP                               [facility] Adult PT Treatment/Exercise - 06/21/21 0001       Transfers   Five time sit to stand comments  16 sec with UE use      Ambulation/Gait   Ambulation/Gait Yes    Ambulation/Gait Assistance 6: Modified independent (Device/Increase time)    Ambulation Distance (Feet) 835 Feet    Assistive device --   3 wheel rollator   Gait Pattern Decreased step length - left;Decreased step length - right           Treatment this date consisted of re-evaluating goals and measures.   Pt completed 6 MWT with 3 wheel walker and was abel to both improve her speed and able to complete full 6 minutes of walking without rest due to fatigue or increaseing L LE pain.  -continued decreased step length with R LE but L LE had improved step length.  -Pt continues to have slight turn of rolator with stance phase on the R LE but is able to self correct this gait pattern and does not cause her to veer off course  Stair trial: pt abel to ascend 1 step but reports she would not be able to get down set of steps if she went up them  . Will work on Best boy as well as Runner, broadcasting/film/video in future session to address pt reported goals.   Pt completed 5 x STS test with use of hands.  -with test pt utilized improved mechanics for transitioning from STS compared to  initial evaluation.   Manual therapy: performed with pt in supine position  3*45 sec hamstring stretch on L with cues to allow therapist to freely movem limb to allow for proper stretch Piriformis muscular flexibility assessed and pt had mod limitations in muscular flexibility on the left  3* 45 sec pitriformis stretch on L with similar cues to relax and allow therapist to assist with proper stretch   Pt reports stretching feels good and relieves her pain following       PT Education - 06/21/21 1117     Education Details POC, progress with activities and indications for her function    Person(s) Educated Patient    Methods Explanation    Comprehension Verbalized understanding              PT Short Term Goals - 06/21/21 1032       PT SHORT TERM GOAL #1   Title Patient will be independent in home exercise program to improve strength/mobility for better functional independence with ADLs.    Baseline MEet    Time 3    Period Days    Status Achieved      PT SHORT TERM GOAL #2   Title Pt will be able to complete 6 MWT without requiring rest break or sitting in order to indicate ipmroved aerobic and muscular endurance for everyday tasks.    Baseline 835 feet with 3 wheel walker at PN 9/8    Time 4    Period Weeks    Status Achieved      PT SHORT TERM GOAL #3   Title 15    Baseline 36    Period Weeks    Status Achieved    Target Date 06/18/21               PT Long Term Goals - 06/21/21 1047       PT LONG TERM GOAL #1   Title Pt will improve FOTO score to 60 in order to indicate improvement with everyday tasks    Baseline FOTO: 58 om 9/8 (met initial foto goal)    Time 6    Period Weeks    Status Revised      PT LONG TERM  GOAL #2   Title Patient will complete five times sit to stand test in < 13 seconds indicating an increased LE strength and improved balance.    Baseline 5XSTS in 16 sec with UE use 06/21/21    Time 6    Period Weeks    Status Revised      PT LONG TERM GOAL #3   Title Patient will increase six minute walk test distance to >900 feet for progression to improve gait ability and safety with ambulation    Baseline 835 at progress note    Time 8    Period Weeks    Status Partially Met      PT LONG TERM GOAL #4   Title Pt will safely demonstrate ability to navigate up and down 4 stairs without assistance in order to improve ability to navigate stairs in her daily life    Baseline able to ascend 1 stair but too reports fear of descending more than 1 stair    Time 8    Period Weeks    Status New    Target Date 08/16/21                   Plan - 06/21/21 1118     Clinical  Impression Statement Pt has made excellent progress with therapy at this time. Her walking endurance and pain with ambulation have both improved significantly since initial evaluation but pt still is unable to ambulate at speed that makes her safe for community ambulation. Pt also has mde progress with 5 x STS test indicating improvement in muscular power. Pt also has improved FOTO score significantly indicating improvement in pt reports function with everyday tasks related to her condition. Pt still has difficulty with ambulatin and reports significant recent difficulty with stair navigation and curb navigation. Pt will benefit from continued therpay to focus on these tasks and address her remaining goals. Patient's condition has the potential to improve in response to therapy. Maximum improvement is yet to be obtained. The anticipated improvement is attainable and reasonable in a generally predictable time.  Pt will greatly benefit from skilled physical therapy in the outpatient setting in order to address her remaining goals  and improve her QOL.      Personal Factors and Comorbidities Age;Comorbidity 1    Examination-Activity Limitations Bend;Carry;Squat;Stairs    Examination-Participation Restrictions Cleaning;Laundry    Stability/Clinical Decision Making Evolving/Moderate complexity    Clinical Decision Making Moderate    Rehab Potential Good    PT Frequency 2x / week    PT Duration 8 weeks    PT Treatment/Interventions ADLs/Self Care Home Management;Aquatic Therapy;Electrical Stimulation;Gait training;Stair training;Functional mobility training;Therapeutic activities;Therapeutic exercise;Balance training;Neuromuscular re-education;Patient/family education;Prosthetic Training;Manual techniques;Passive range of motion;Energy conservation    PT Next Visit Plan Progress note    PT Home Exercise Plan No updates to HEP this date    Consulted and Agree with Plan of Care Patient             Patient will benefit from skilled therapeutic intervention in order to improve the following deficits and impairments:  Abnormal gait, Decreased balance, Decreased endurance, Decreased mobility, Decreased activity tolerance  Visit Diagnosis: Difficulty in walking, not elsewhere classified  History of falling     Problem List Patient Active Problem List   Diagnosis Date Noted   Primary localized osteoarthritis of left hip 03/30/2019   Status post left hip replacement 03/30/2019    Particia Lather, PT 06/21/2021, 11:29 AM  [place] [address] [place], [place], [address] Phone: [phone]   Fax:  [phone]  Name: [name] MRN: [id number] Date of Birth: [birth year]

## 2021-06-21 NOTE — Patient Instructions (Signed)
Access Code: [email or web address] URL: [email or web address] Date: 06/21/2021 Prepared by: Rivka Barbara  Exercises Seated Hamstring Stretch - 1 x daily - 7 x weekly - 3 sets - 45 hold Seated gastroc stretch with towel or strap - 1 x daily - 7 x weekly - 3 sets - 45 hold Sit to Stand with Armchair - 1 x daily - 7 x weekly - 3 sets - 6 reps

## 2021-06-26 ENCOUNTER — Other Ambulatory Visit: Payer: Self-pay

## 2021-06-26 ENCOUNTER — Ambulatory Visit: Payer: Medicare Other | Admitting: Physical Therapy

## 2021-06-26 DIAGNOSIS — R262 Difficulty in walking, not elsewhere classified: Secondary | ICD-10-CM | POA: Diagnosis not present

## 2021-06-26 DIAGNOSIS — Z9181 History of falling: Secondary | ICD-10-CM | POA: Diagnosis not present

## 2021-06-26 DIAGNOSIS — M79662 Pain in left lower leg: Secondary | ICD-10-CM | POA: Diagnosis not present

## 2021-06-26 NOTE — Therapy (Signed)
[place] MAIN [facility] SERVICES [address] [place], [place], [address] Phone: [phone]   Fax:  [phone]  Physical Therapy Treatment  Patient Details  Name: [name] MRN: [id number] Date of Birth: 07-16-1946 Referring Provider (PT): Merlene Pulling PA-C   Encounter Date: 06/26/2021   PT End of Session - 06/26/21 1506     Visit Number 11    Number of Visits 24    Date for PT Re-Evaluation 08/13/21    Authorization Type Medicare    Authorization Time Period 05/21/21-08/13/21    Progress Note Due on Visit 20    PT Start Time 1131    PT Stop Time 1217    PT Time Calculation (min) 46 min    Equipment Utilized During Treatment Gait belt    Activity Tolerance Patient tolerated treatment well;Other (comment)    Behavior During Therapy WFL for tasks assessed/performed             Past Medical History:  Diagnosis Date   Allergic rhinitis    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   Heart murmur    mild mitral regurg-echo 2010   Hyperlipidemia    Hypertension    Lupus anticoagulant positive    Obesity    Pre-diabetes    Primary localized osteoarthritis of left hip 03/30/2019   Stroke [facility]) 2010   Some right sided weakness.  Gait issues.   Wears dentures    partial upper    Past Surgical History:  Procedure Laterality Date   ABDOMINAL HYSTERECTOMY  1981   APPENDECTOMY     AXILLARY LYMPH NODE BIOPSY Left 05/16/2014   Procedure: EXCISION 2CM LEFT AXILLARY MASS;  Surgeon: [name], MD;  Location: [place];  Service: General;  Laterality: Left;  Left axillary sebaceous cyst excision   CATARACT EXTRACTION W/PHACO Left 04/11/2020   Procedure: CATARACT EXTRACTION PHACO AND  INTRAOCULAR LENS PLACEMENT ([place]) LEFT DIABETIC TORIC LENS  6.05 00:34.9;  Surgeon: [name], MD;  Location: [place];  Service: Ophthalmology;  Laterality: Left;   CATARACT EXTRACTION W/PHACO Right 12/12/2020   Procedure: CATARACT EXTRACTION PHACO AND INTRAOCULAR LENS PLACEMENT ([place]) RIGHT TORIC LENS;  Surgeon: [name], MD;  Location: [place];  Service: Ophthalmology;  Laterality: Right;  6.55 0:41.1   DILATION AND CURETTAGE OF UTERUS     SKIN CANCER EXCISION  2016   [facility]   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   Procedure: TOTAL HIP ARTHROPLASTY;  Surgeon: [name], MD;  Location[facility];  Service: Orthopedics;  Laterality: Left;    There were no vitals filed for this visit.   Subjective Assessment - 06/26/21 1106     Subjective Pt reports soreness following the last session. Pt also reports minimal back pain today but "nothing out of the usual".    Pertinent History Pt reports she had a stroke in 2010 and was in inpatient rehab for 4 weeks. Pt reports she has been to therapy about one time a year since the onset of her stroke for various ailments including L THA in 2020. Pt reports she has bioness L300 which improved her drop foot on her R ankle. Pt has depended on her L LE since  the stroke due to her weakness and impaired funciton on the right LE. Since the fall she has had throbbing pain in the left LE which is exacerbated with increased weightbearing activity. Pt reports she has had x rays and it shows nothing is broken but despite this she has had continued pain in the left. Pt reports pain is on the lateral aspect of her left LE up into her posterior thigh. Pt reports doctor thinks this could be from nerve related pain but will need PT in order for her insurance to cover MRI of the lumbar spine. Prior to this fall she fell and broke her right wrist.  Pt reports she can perform any activities above the level of the wast but she is unable to bend  over to complete tasks. Pt reports she has multiple wakers, 3 wheel, 4 wheel, wheelchair, standard rolling walker. Pt had brain MRI following fall and no reports of bleeding or issues.    Limitations Walking    How long can you sit comfortably? a couple hours due to low back pain    How long can you walk comfortably? Able to walk around [employer] but was very fatigued following    Currently in Pain? Yes    Pain Score 7     Pain Location Leg   around lateral maleolus of left ankle.   Pain Orientation Left    Pain Descriptors / Indicators Throbbing    Pain Type Chronic pain    Pain Onset More than a month ago    Pain Frequency Constant             Treatment provided this session  Manual therapy Manual therapy: performed with pt in supine position 3*45 sec hamstring stretch on L with cues to allow therapist to freely movem limb to allow for proper stretch Piriformis muscular flexibility assessed and pt had mod limitations in muscular flexibility on the left 3* 45 sec pitriformis stretch on L with similar cues to relax and allow therapist to assist with proper stretch  30 x PA ankle mobs grade 2/3 -pt reported improved s/s of ankle pain following.   Ther Act  The following activities were completed in parallel bars or at balance bar  Stair training 2 x 10 foot taps bilaterally with B HH A 2 x 10 Foot taps with R HH A  2 x 10 foot taps with L HH A  -with R HH A pt had significantly increased difficulty and required minA from author for support. With L HH A pt had min difficulty.   Step down from 4 inch step -2 x 10 leading with the right -When leading with the left pt had significant increase in difficulty and was not able to complete   Step up and over 4 inch step 5 x each direction leading with choice of LE   Walking on and turning on airex pad x 2 - to simulate walking onto curb                                PT Short Term Goals - 06/21/21 1032        PT SHORT TERM GOAL #1   Title Patient will be independent in home exercise program to improve strength/mobility for better functional independence with ADLs.    Baseline Met    Time 3    Period Days    Status Achieved  PT SHORT TERM GOAL #2   Title Pt will be able to complete 6 MWT without requiring rest break or sitting in order to indicate ipmroved aerobic and muscular endurance for everyday tasks.    Baseline 835 feet with 3 wheel walker at PN 9/8    Time 4    Period Weeks    Status Achieved      PT SHORT TERM GOAL #3   Title Patient will complete five times sit to stand test in < 25 seconds indicating an increased LE strength and improved balance.    Baseline 16 sec 9/8 with UE use    Time 4    Period Weeks    Status Achieved    Target Date 06/18/21               PT Long Term Goals - 06/21/21 1047       PT LONG TERM GOAL #1   Title Pt will improve FOTO score to 60 in order to indicate improvement with everyday tasks    Baseline FOTO: 58 om 9/8 (met initial foto goal)    Time 6    Period Weeks    Status Revised      PT LONG TERM GOAL #2   Title Patient will complete five times sit to stand test in < 13 seconds indicating an increased LE strength and improved balance.    Baseline 5XSTS in 16 sec with UE use 06/21/21    Time 6    Period Weeks    Status Revised      PT LONG TERM GOAL #3   Title Patient will increase six minute walk test distance to >900 feet for progression to improve gait ability and safety with ambulation    Baseline 835 at progress note    Time 8    Period Weeks    Status Partially Met      PT LONG TERM GOAL #4   Title Pt will safely demonstrate ability to navigate up and down 4 stairs without assistance in order to improve ability to navigate stairs in her daily life    Baseline able to ascend 1 stair but too reports fear of descending more than 1 stair    Time 8    Period Weeks    Status New    Target Date 08/16/21                    Plan - 06/26/21 1507     Clinical Impression Statement Pt tolerated therapeutic activities well as we tried to improvement pt's reported difficulty with with stair and step navigation. Pt showed progress in her ability to navigate stairs but still requires significant UE support and is not able to complete stair related activities without the aid of her L UE for support. Pt continues to show improvements with her ankle pain following manual therapy focusing on L LE stretching and also working on some gentle ankle mobilizations today. Pt will continue to benefit from skilled physical therapy intervention to address impairments, improve his QOL, and attain her therapy goals.     Personal Factors and Comorbidities Age;Comorbidity 1    Examination-Activity Limitations Bend;Carry;Squat;Stairs    Examination-Participation Restrictions Cleaning;Laundry    Stability/Clinical Decision Making Evolving/Moderate complexity    Clinical Decision Making Moderate    Rehab Potential Good    PT Frequency 2x / week    PT Duration 8 weeks    PT Treatment/Interventions ADLs/Self Care Home Management;Aquatic Therapy;Electrical Stimulation;Gait  training;Stair training;Functional mobility training;Therapeutic activities;Therapeutic exercise;Balance training;Neuromuscular re-education;Patient/family education;Prosthetic Training;Manual techniques;Passive range of motion;Energy conservation    PT Next Visit Plan Progress note    PT Home Exercise Plan No updates to HEP this date    Consulted and Agree with Plan of Care Patient             Patient will benefit from skilled therapeutic intervention in order to improve the following deficits and impairments:  Abnormal gait, Decreased balance, Decreased endurance, Decreased mobility, Decreased activity tolerance  Visit Diagnosis: Difficulty in walking, not elsewhere classified  History of falling     Problem List Patient Active Problem List    Diagnosis Date Noted   Primary localized osteoarthritis of left hip 03/30/2019   Status post left hip replacement 03/30/2019    Particia Lather, PT 06/26/2021, 3:19 PM  [place] [address] [place], [place], [address] Phone: [phone]   Fax:  [phone]  Name: [name] MRN: [id number] Date of Birth: 09-17-1946

## 2021-06-29 ENCOUNTER — Other Ambulatory Visit: Payer: Self-pay

## 2021-06-29 ENCOUNTER — Ambulatory Visit: Payer: Medicare Other | Admitting: Physical Therapy

## 2021-06-29 DIAGNOSIS — R262 Difficulty in walking, not elsewhere classified: Secondary | ICD-10-CM

## 2021-06-29 DIAGNOSIS — Z9181 History of falling: Secondary | ICD-10-CM | POA: Diagnosis not present

## 2021-06-29 DIAGNOSIS — M79662 Pain in left lower leg: Secondary | ICD-10-CM | POA: Diagnosis not present

## 2021-06-29 NOTE — Therapy (Signed)
[place] MAIN [facility] SERVICES [address] [place], [place], [address] Phone: [phone]   Fax:  [phone]  Physical Therapy Treatment  Patient Details  Name: [name] MRN: [id number] Date of Birth: 12-22-1945 Referring Provider (PT): Merlene Pulling PA-C   Encounter Date: 06/29/2021   PT End of Session - 06/29/21 1138     Visit Number 12    Number of Visits 24    Date for PT Re-Evaluation 08/13/21    Authorization Type Medicare    Authorization Time Period 05/21/21-08/13/21    Progress Note Due on Visit 20    PT Start Time 0936    PT Stop Time 1015    PT Time Calculation (min) 39 min    Equipment Utilized During Treatment Gait belt    Activity Tolerance Patient tolerated treatment well;Other (comment)    Behavior During Therapy WFL for tasks assessed/performed             Past Medical History:  Diagnosis Date   Allergic rhinitis    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   Heart murmur    mild mitral regurg-echo 2010   Hyperlipidemia    Hypertension    Lupus anticoagulant positive    Obesity    Pre-diabetes    Primary localized osteoarthritis of left hip 03/30/2019   Stroke [facility]) 2010   Some right sided weakness.  Gait issues.   Wears dentures    partial upper    Past Surgical History:  Procedure Laterality Date   ABDOMINAL HYSTERECTOMY  1981   APPENDECTOMY     AXILLARY LYMPH NODE BIOPSY Left 05/16/2014   Procedure: EXCISION 2CM LEFT AXILLARY MASS;  Surgeon: [name], MD;  Location: [place];  Service: General;  Laterality: Left;  Left axillary sebaceous cyst excision   CATARACT EXTRACTION W/PHACO Left 04/11/2020   Procedure: CATARACT EXTRACTION PHACO AND  INTRAOCULAR LENS PLACEMENT ([place]) LEFT DIABETIC TORIC LENS  6.05 00:34.9;  Surgeon: [name], MD;  Location: [place];  Service: Ophthalmology;  Laterality: Left;   CATARACT EXTRACTION W/PHACO Right 12/12/2020   Procedure: CATARACT EXTRACTION PHACO AND INTRAOCULAR LENS PLACEMENT ([place]) RIGHT TORIC LENS;  Surgeon: [name], MD;  Location: [place];  Service: Ophthalmology;  Laterality: Right;  6.55 0:41.1   DILATION AND CURETTAGE OF UTERUS     SKIN CANCER EXCISION  2016   [facility]   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   Procedure: TOTAL HIP ARTHROPLASTY;  Surgeon: [name], MD;  [facility];  Service: Orthopedics;  Laterality: Left;    There were no vitals filed for this visit.  Note: Portions of this document were prepared using Dragon voice recognition software and although reviewed may contain unintentional dictation errors in syntax, grammar, or spelling.  Treatment provided this session  Manual therapy: performed with pt in supine position  3*45 sec hamstring stretch on L with cues to allow therapist to freely movem limb to allow for proper stretch -Minimal popping noted following second stretch.  Patient reported no pain and it actually felt good. Piriformis muscular flexibility assessed and pt had mod limitations in muscular flexibility on the left 3* 45 sec pitriformis stretch on L with similar cues  to relax and allow therapist to assist with proper stretch  - STM to distal peronael leg musculature on the L LE, pt reported comparabl sign with palpation of this musculature  -X5 minutes, patient reported improvement in signs and symptoms with pain in the left lower extremity following this particular manual therapy technique.  Therex:  Walking with 3 wheeled Rollator.  450 feet.  Contact-guard assist.  Some fatigue noted on 300 feet. -Patient reported improvement in signs and symptoms of left lower extremity pain with ambulation today  following manual therapy.  Patient has continued to note improvement in pain levels following manual therapy but there has not been much carryover into her days following therapy.  Patient has been instructed on ways to stretch musculature but is unclear how often she has been performing these exercises.     Sit to stand x2.  On second repetition patient noted increased pain in her left hamstring area.  This activity was ceased as a result.  The following activities were completed in parallel bars or at balance bar   Standing marching x10 on each lower extremity.  Bilateral handhold assist for balance  standing hip extension x10 each lower extremity.  Cues to prevent excessive forward trunk lean.  And in order to target specific musculature.  Bilateral handhold assist for balance                            PT Education - 06/29/21 1137     Education Details Patient educated regarding potential for nerve entrapment and left lateral compartment of her leg    Person(s) Educated Patient    Methods Explanation    Comprehension Verbalized understanding              PT Short Term Goals - 06/21/21 1032       PT SHORT TERM GOAL #1   Title Patient will be independent in home exercise program to improve strength/mobility for better functional independence with ADLs.    Baseline Met    Time 3    Period Days    Status Achieved      PT SHORT TERM GOAL #2   Title Pt will be able to complete 6 MWT without requiring rest break or sitting in order to indicate ipmroved aerobic and muscular endurance for everyday tasks.    Baseline 835 feet with 3 wheel walker at PN 9/8    Time 4    Period Weeks    Status Achieved      PT SHORT TERM GOAL #3   Title Patient will complete five times sit to stand test in < 25 seconds indicating an increased LE strength and improved balance.    Baseline 16 sec 9/8 with UE use    Time 4    Period Weeks    Status Achieved    Target Date  06/18/21               PT Long Term Goals - 06/21/21 1047       PT LONG TERM GOAL #1   Title Pt will improve FOTO score to 60 in order to indicate improvement with everyday tasks    Baseline FOTO: 58 om 9/8 (met initial foto goal)    Time 6    Period Weeks    Status Revised      PT LONG TERM GOAL #2   Title Patient will complete five times sit to stand test in <  13 seconds indicating an increased LE strength and improved balance.    Baseline 5XSTS in 16 sec with UE use 06/21/21    Time 6    Period Weeks    Status Revised      PT LONG TERM GOAL #3   Title Patient will increase six minute walk test distance to >900 feet for progression to improve gait ability and safety with ambulation    Baseline 835 at progress note    Time 8    Period Weeks    Status Partially Met      PT LONG TERM GOAL #4   Title Pt will safely demonstrate ability to navigate up and down 4 stairs without assistance in order to improve ability to navigate stairs in her daily life    Baseline able to ascend 1 stair but too reports fear of descending more than 1 stair    Time 8    Period Weeks    Status New    Target Date 08/16/21                   Plan - 06/29/21 1138     Clinical Impression Statement Patient tolerated treatment session therapy today.  Patient continues to report improvement in left lower extremity pain following manual therapy.  During manual therapy today PT assessed left lower extremity and found trigger point or nerve entrapment that reproduced pain down her left lower extremity.  Following mobilization of this area patient reported improvement in pain levels and her left ankle and lower leg.  Patient continues to have difficulty with transition from sit to and from stand.  And today during therapy she had 1 occasion where she had some pain and pressure left posterior upper lower extremity similar to hamstring type pain or potentially sciatic nerve related pain.  Patient will  continue to benefit from skilled physical therapy intervention in order to improve her ambulatory capacity, pain levels strength, and overall function.    Personal Factors and Comorbidities Age;Comorbidity 1    Examination-Activity Limitations Bend;Carry;Squat;Stairs    Examination-Participation Restrictions Cleaning;Laundry    Stability/Clinical Decision Making Evolving/Moderate complexity    Rehab Potential Good    PT Frequency 2x / week    PT Duration 8 weeks    PT Treatment/Interventions ADLs/Self Care Home Management;Aquatic Therapy;Electrical Stimulation;Gait training;Stair training;Functional mobility training;Therapeutic activities;Therapeutic exercise;Balance training;Neuromuscular re-education;Patient/family education;Prosthetic Training;Manual techniques;Passive range of motion;Energy conservation    PT Next Visit Plan Progress note    PT Home Exercise Plan No updates to HEP this date    Consulted and Agree with Plan of Care Patient             Patient will benefit from skilled therapeutic intervention in order to improve the following deficits and impairments:  Abnormal gait, Decreased balance, Decreased endurance, Decreased mobility, Decreased activity tolerance  Visit Diagnosis: Difficulty in walking, not elsewhere classified  History of falling     Problem List Patient Active Problem List   Diagnosis Date Noted   Primary localized osteoarthritis of left hip 03/30/2019   Status post left hip replacement 03/30/2019    Particia Lather, PT 06/29/2021, 11:48 AM  [facility] [address] [place], [place], [address] Phone: [phone]   Fax:  [phone]  Name: SHENICKA SUNDERLIN MRN: [id number] Date of Birth: August 19, 1946

## 2021-07-04 ENCOUNTER — Other Ambulatory Visit: Payer: Self-pay

## 2021-07-04 ENCOUNTER — Ambulatory Visit: Payer: Medicare Other | Admitting: Physical Therapy

## 2021-07-04 DIAGNOSIS — M79662 Pain in left lower leg: Secondary | ICD-10-CM | POA: Diagnosis not present

## 2021-07-04 DIAGNOSIS — R262 Difficulty in walking, not elsewhere classified: Secondary | ICD-10-CM

## 2021-07-04 DIAGNOSIS — Z9181 History of falling: Secondary | ICD-10-CM

## 2021-07-04 NOTE — Therapy (Signed)
[place] MAIN [facility] SERVICES [address] [place], [place], [address] Phone: [phone]   Fax:  [phone]  Physical Therapy Treatment  Patient Details  Name: [name] MRN: [id number] Date of Birth: Jul 26, 1946 Referring Provider (PT): Merlene Pulling PA-C   Encounter Date: 07/04/2021   PT End of Session - 07/04/21 1353     Visit Number 13    Number of Visits 24    Date for PT Re-Evaluation 08/13/21    Authorization Type Medicare    Authorization Time Period 05/21/21-08/13/21    Progress Note Due on Visit 20    PT Start Time 0934    PT Stop Time 1014    PT Time Calculation (min) 40 min    Equipment Utilized During Treatment Gait belt    Activity Tolerance Patient tolerated treatment well;Other (comment)    Behavior During Therapy WFL for tasks assessed/performed             Past Medical History:  Diagnosis Date   Allergic rhinitis    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   Heart murmur    mild mitral regurg-echo 2010   Hyperlipidemia    Hypertension    Lupus anticoagulant positive    Obesity    Pre-diabetes    Primary localized osteoarthritis of left hip 03/30/2019   Stroke [facility]) 2010   Some right sided weakness.  Gait issues.   Wears dentures    partial upper    Past Surgical History:  Procedure Laterality Date   ABDOMINAL HYSTERECTOMY  1981   APPENDECTOMY     AXILLARY LYMPH NODE BIOPSY Left 05/16/2014   Procedure: EXCISION 2CM LEFT AXILLARY MASS;  Surgeon: [name], MD;  Location: [place];  Service: General;  Laterality: Left;  Left axillary sebaceous cyst excision   CATARACT EXTRACTION W/PHACO Left 04/11/2020   Procedure: CATARACT EXTRACTION PHACO AND  INTRAOCULAR LENS PLACEMENT ([place]) LEFT DIABETIC TORIC LENS  6.05 00:34.9;  Surgeon: [name], MD;  Location: [place];  Service: Ophthalmology;  Laterality: Left;   CATARACT EXTRACTION W/PHACO Right 12/12/2020   Procedure: CATARACT EXTRACTION PHACO AND INTRAOCULAR LENS PLACEMENT ([place]) RIGHT TORIC LENS;  Surgeon: [name], MD;  Location: [place];  Service: Ophthalmology;  Laterality: Right;  6.55 0:41.1   DILATION AND CURETTAGE OF UTERUS     SKIN CANCER EXCISION  2016   [facility]   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   Procedure: TOTAL HIP ARTHROPLASTY;  Surgeon: [name], MD;  [facility];  Service: Orthopedics;  Laterality: Left;    There were no vitals filed for this visit.     Treatment provided this session   Manual therapy: performed with pt in supine position   2*45 sec hamstring stretch on L with cues to allow therapist to freely movem limb to allow for proper stretch -Minimal popping noted following second stretch.  Patient reported no pain and it actually felt good. Piriformis muscular flexibility assessed and pt had mod limitations in muscular flexibility on the left 2* 45 sec pitriformis stretch on L with similar cues to relax and allow therapist to assist with proper stretch  - STM to distal peronael leg musculature on the L  LE, pt reported comparabl sign with palpation of this musculature             -X9 minutes, patient reported improvement in signs and symptoms with pain in the left lower extremity following this particular manual therapy technique.   Therex:   Walking with 3 wheeled Rollator.  450 feet.  Contact-guard assist.  -Patient reported improvement in signs and symptoms of left lower extremity pain with ambulation today following manual therapy.  Patient has continued to note improvement in pain levels following manual therapy but there has not been much carryover into her days following therapy.  Patient has been  instructed on ways to stretch musculature but is unclear how often she has been performing these exercises.       Sit to stand x 5.  On repetition 5 patient noted increased pain in her left hamstring area.  This activity was ceased as a result., completed seated HS stretch 2 x 30 sec as a result     stair training 5 x step taps on each LE  5 x step ups alternating L and R LE -imroved efficacy with R LE leading on concentricand L Le leading on eccentric, with R LE leading pt tends to demonstrate cross over stepping pattern which is a safety concern in case of LOB.                           PT Short Term Goals - 06/21/21 1032       PT SHORT TERM GOAL #1   Title Patient will be independent in home exercise program to improve strength/mobility for better functional independence with ADLs.    Baseline Met    Time 3    Period Days    Status Achieved      PT SHORT TERM GOAL #2   Title Pt will be able to complete 6 MWT without requiring rest break or sitting in order to indicate ipmroved aerobic and muscular endurance for everyday tasks.    Baseline 835 feet with 3 wheel walker at PN 9/8    Time 4    Period Weeks    Status Achieved      PT SHORT TERM GOAL #3   Title Patient will complete five times sit to stand test in < 25 seconds indicating an increased LE strength and improved balance.    Baseline 16 sec 9/8 with UE use    Time 4    Period Weeks    Status Achieved    Target Date 06/18/21               PT Long Term Goals - 06/21/21 1047       PT LONG TERM GOAL #1   Title Pt will improve FOTO score to 60 in order to indicate improvement with everyday tasks    Baseline FOTO: 58 om 9/8 (met initial foto goal)    Time 6    Period Weeks    Status Revised      PT LONG TERM GOAL #2   Title Patient will complete five times sit to stand test in < 13 seconds indicating an increased LE strength and improved balance.    Baseline 5XSTS in 16 sec with UE use  06/21/21    Time 6    Period Weeks    Status Revised      PT LONG TERM GOAL #3   Title Patient will increase six minute walk  test distance to >900 feet for progression to improve gait ability and safety with ambulation    Baseline 835 at progress note    Time 8    Period Weeks    Status Partially Met      PT LONG TERM GOAL #4   Title Pt will safely demonstrate ability to navigate up and down 4 stairs without assistance in order to improve ability to navigate stairs in her daily life    Baseline able to ascend 1 stair but too reports fear of descending more than 1 stair    Time 8    Period Weeks    Status New    Target Date 08/16/21                   Plan - 07/04/21 1350     Clinical Impression Statement Patient tolerated treatment session well.  Patient continues to report improved lower extremity pain following manual therapy with targeting of peroneal muscles on left ankle.  Patient continues to have difficulty with sit to and from stand but following manual therapy patient displayed improved ability to perform sit to stand transfer as well as improved ability to ambulate during ambulatory bouts around the clinic.  Patient also continues to have some hamstring pain with increased repetitions with sit to stand patient encouraged to continue hamstring stretching at home in order to improve hamstring flexibility and tolerance for activities.  Patient will continue to benefit from skilled physical therapy intervention in order to improve her pain levels and improve her overall function.    Personal Factors and Comorbidities Age;Comorbidity 1    Examination-Activity Limitations Bend;Carry;Squat;Stairs    Examination-Participation Restrictions Cleaning;Laundry    Stability/Clinical Decision Making Evolving/Moderate complexity    Rehab Potential Good    PT Frequency 2x / week    PT Duration 8 weeks    PT Treatment/Interventions ADLs/Self Care Home Management;Aquatic  Therapy;Electrical Stimulation;Gait training;Stair training;Functional mobility training;Therapeutic activities;Therapeutic exercise;Balance training;Neuromuscular re-education;Patient/family education;Prosthetic Training;Manual techniques;Passive range of motion;Energy conservation    PT Next Visit Plan Progress note    PT Home Exercise Plan No updates to HEP this date    Consulted and Agree with Plan of Care Patient             Patient will benefit from skilled therapeutic intervention in order to improve the following deficits and impairments:  Abnormal gait, Decreased balance, Decreased endurance, Decreased mobility, Decreased activity tolerance  Visit Diagnosis: Difficulty in walking, not elsewhere classified  History of falling     Problem List Patient Active Problem List   Diagnosis Date Noted   Primary localized osteoarthritis of left hip 03/30/2019   Status post left hip replacement 03/30/2019    Particia Lather, PT 07/04/2021, 1:54 PM  [place] [address] [place], [place], [address] Phone: [phone]   Fax:  [phone]  Name: [name] MRN: [id number] Date of Birth: 1946-05-03

## 2021-07-06 ENCOUNTER — Ambulatory Visit: Payer: Medicare Other | Admitting: Physical Therapy

## 2021-07-06 ENCOUNTER — Other Ambulatory Visit: Payer: Self-pay

## 2021-07-06 DIAGNOSIS — M79662 Pain in left lower leg: Secondary | ICD-10-CM | POA: Diagnosis not present

## 2021-07-06 DIAGNOSIS — R262 Difficulty in walking, not elsewhere classified: Secondary | ICD-10-CM | POA: Diagnosis not present

## 2021-07-06 DIAGNOSIS — Z9181 History of falling: Secondary | ICD-10-CM

## 2021-07-06 NOTE — Therapy (Signed)
[place] MAIN [facility] SERVICES [address] [place], [place], [address] Phone: [phone]   Fax:  [phone]  Physical Therapy Treatment  Patient Details  Name: [name] MRN: [id number] Date of Birth: 03-03-1946 Referring Provider (PT): Merlene Pulling PA-C   Encounter Date: 07/06/2021   PT End of Session - 07/06/21 1000     Visit Number 14    Number of Visits 24    Date for PT Re-Evaluation 08/13/21    Authorization Type Medicare    Authorization Time Period 05/21/21-08/13/21    Progress Note Due on Visit 20    PT Start Time 0932    PT Stop Time 1013    PT Time Calculation (min) 41 min    Equipment Utilized During Treatment Gait belt    Activity Tolerance Patient tolerated treatment well;Other (comment)    Behavior During Therapy WFL for tasks assessed/performed             Past Medical History:  Diagnosis Date   Allergic rhinitis    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   Heart murmur    mild mitral regurg-echo 2010   Hyperlipidemia    Hypertension    Lupus anticoagulant positive    Obesity    Pre-diabetes    Primary localized osteoarthritis of left hip 03/30/2019   Stroke [facility]) 2010   Some right sided weakness.  Gait issues.   Wears dentures    partial upper    Past Surgical History:  Procedure Laterality Date   ABDOMINAL HYSTERECTOMY  1981   APPENDECTOMY     AXILLARY LYMPH NODE BIOPSY Left 05/16/2014   Procedure: EXCISION 2CM LEFT AXILLARY MASS;  Surgeon: [name], MD;  Location: [place];  Service: General;  Laterality: Left;  Left axillary sebaceous cyst excision   CATARACT EXTRACTION W/PHACO Left 04/11/2020   Procedure: CATARACT EXTRACTION PHACO AND  INTRAOCULAR LENS PLACEMENT ([place]) LEFT DIABETIC TORIC LENS  6.05 00:34.9;  Surgeon: [name], MD;  Location: [place];  Service: Ophthalmology;  Laterality: Left;   CATARACT EXTRACTION W/PHACO Right 12/12/2020   Procedure: CATARACT EXTRACTION PHACO AND INTRAOCULAR LENS PLACEMENT ([place]) RIGHT TORIC LENS;  Surgeon: [name], MD;  Location: [place];  Service: Ophthalmology;  Laterality: Right;  6.55 0:41.1   DILATION AND CURETTAGE OF UTERUS     SKIN CANCER EXCISION  2016   [place]   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   Procedure: TOTAL HIP ARTHROPLASTY;  Surgeon: [name], MD;  [facility];  Service: Orthopedics;  Laterality: Left;    There were no vitals filed for this visit.   Subjective Assessment - 07/06/21 0957     Subjective Pt reports slight improvement following last session and thinks PT is helping tremendously for her pain and function    Pertinent History Pt reports she had a stroke in 2010 and was in inpatient rehab for 4 weeks. Pt reports she has been to therapy about one time a year since the onset of her stroke for various ailments including L THA in 2020. Pt reports she has bioness L300 which improved her drop foot on her R ankle. Pt has depended on her L LE since the stroke  due to her weakness and impaired funciton on the right LE. Since the fall she has had throbbing pain in the left LE which is exacerbated with increased weightbearing activity. Pt reports she has had x rays and it shows nothing is broken but despite this she has had continued pain in the left. Pt reports pain is on the lateral aspect of her left LE up into her posterior thigh. Pt reports doctor thinks this could be from nerve related pain but will need PT in order for her insurance to cover MRI of the lumbar spine. Prior to this fall she fell and broke her right wrist.  Pt reports she can perform any activities above the level of the wast but she is unable to bend over  to complete tasks. Pt reports she has multiple wakers, 3 wheel, 4 wheel, wheelchair, standard rolling walker. Pt had brain MRI following fall and no reports of bleeding or issues.    Limitations Walking    How long can you sit comfortably? a couple hours due to low back pain    How long can you stand comfortably? 15 minutes    How long can you walk comfortably? Able to walk around [place] but was very fatigued following    Currently in Pain? Yes    Pain Score 5     Pain Location Leg    Pain Orientation Left    Pain Descriptors / Indicators Throbbing    Pain Type Chronic pain    Pain Onset More than a month ago    Pain Frequency Constant    Pain Relieving Factors Physical therapy intervention               Treatment provided this session        Therex:   Peroneal nerve glides in seated with L LE elevated on stool. 2 x 10  -inversion and planterflexion followed by dirsiflexion and inversion -manual assist on first 5 repetitions  -cues required for hold times of approximately 3 seconds each       There Act:   Walking with 3 wheeled Rollator.  2 x 300 feet.  Contact-guard assist.  -Patient reported improvement in signs and symptoms of left lower extremity pain with ambulation today following manual therapy.  Patient has continued to note improvement in pain levels following manual therapy but there has not been much carryover into her days following therapy.  Patient has been instructed on ways to stretch musculature but is unclear how often she has been performing these exercises.       Sit to stand x 5.  On repetition 5 patient noted increased pain in her left hamstring area.  This activity was ceased as a result., completed seated HS stretch 2 x 30 sec as a result      stair training  5 x step ups utilizinf R LE for concentric and eccentric portion 4 x step downs utilizing R LE as stance leg -further instruction regarding demonstration of proper descent of stairs  when at her son's (where she has had the most trouble)       Manual Therapy:   Manual therapy: performed with pt in supine position   Contract -relax MET utilized to improve ROM following prolonged manual stretching  2*45 sec hamstring stretch on L with cues to allow therapist to freely movem limb to allow for proper stretch -Minimal popping noted following second stretch.  Patient reported no pain and it actually felt good. Piriformis muscular flexibility  assessed and pt had mod limitations in muscular flexibility on the left  STM to distal peronael leg musculature on the L LE, pt reported comparabl sign with palpation of this musculature -petrissage, cross friction massage utilized interchangeably   -X10 minutes, patient reported improvement in signs and symptoms with pain in the left lower extremity following this particular manual therapy technique. -pt also reports with palpation to this area s/s of pain ascending her LE into the posterior aspect of her LE     Pt educated throughout session about proper posture and technique with exercises. Improved exercise technique, movement at target joints, use of target muscles after min to mod verbal, visual, tactile cues.                           PT Education - 07/06/21 [id number]     Education Details educated regarding nerve entrapment and nerve flossing exercise    Person(s) Educated Patient    Methods Explanation    Comprehension Verbalized understanding              PT Short Term Goals - 06/21/21 1032       PT SHORT TERM GOAL #1   Title Patient will be independent in home exercise program to improve strength/mobility for better functional independence with ADLs.    Baseline Met    Time 3    Period Days    Status Achieved      PT SHORT TERM GOAL #2   Title Pt will be able to complete 6 MWT without requiring rest break or sitting in order to indicate ipmroved aerobic and muscular endurance for  everyday tasks.    Baseline 835 feet with 3 wheel walker at PN 9/8    Time 4    Period Weeks    Status Achieved      PT SHORT TERM GOAL #3   Title Patient will complete five times sit to stand test in < 25 seconds indicating an increased LE strength and improved balance.    Baseline 16 sec 9/8 with UE use    Time 4    Period Weeks    Status Achieved    Target Date 06/18/21               PT Long Term Goals - 06/21/21 1047       PT LONG TERM GOAL #1   Title Pt will improve FOTO score to 60 in order to indicate improvement with everyday tasks    Baseline FOTO: 58 om 9/8 (met initial foto goal)    Time 6    Period Weeks    Status Revised      PT LONG TERM GOAL #2   Title Patient will complete five times sit to stand test in < 13 seconds indicating an increased LE strength and improved balance.    Baseline 5XSTS in 16 sec with UE use 06/21/21    Time 6    Period Weeks    Status Revised      PT LONG TERM GOAL #3   Title Patient will increase six minute walk test distance to >900 feet for progression to improve gait ability and safety with ambulation    Baseline 835 at progress note    Time 8    Period Weeks    Status Partially Met      PT LONG TERM GOAL #4   Title Pt will safely demonstrate ability to navigate up  and down 4 stairs without assistance in order to improve ability to navigate stairs in her daily life    Baseline able to ascend 1 stair but too reports fear of descending more than 1 stair    Time 8    Period Weeks    Status New    Target Date 08/16/21                   Plan - 07/06/21 1000     Clinical Impression Statement Patient tolerated treatment session well. Patient continues to report improved lower extremity pain following manual therapy with targeting of peroneal muscles on left ankle. Also incorporated peroneal nerve flossing exercise and pt tolerated well without increase in pain. Patient also continues to have some hamstring pain with  increased repetitions with sit to stand patient encouraged to continue hamstring stretching at home in order to improve hamstring flexibility and tolerance for activities. Patient will continue to benefit from skilled physical therapy intervention in order to improve her pain levels and improve her overall function.    Personal Factors and Comorbidities Age;Comorbidity 1    Examination-Activity Limitations Bend;Carry;Squat;Stairs    Examination-Participation Restrictions Cleaning;Laundry    Stability/Clinical Decision Making Evolving/Moderate complexity    Rehab Potential Good    PT Frequency 2x / week    PT Duration 8 weeks    PT Treatment/Interventions ADLs/Self Care Home Management;Aquatic Therapy;Electrical Stimulation;Gait training;Stair training;Functional mobility training;Therapeutic activities;Therapeutic exercise;Balance training;Neuromuscular re-education;Patient/family education;Prosthetic Training;Manual techniques;Passive range of motion;Energy conservation    PT Next Visit Plan Progress note    PT Home Exercise Plan No updates to HEP this date    Consulted and Agree with Plan of Care Patient             Patient will benefit from skilled therapeutic intervention in order to improve the following deficits and impairments:  Abnormal gait, Decreased balance, Decreased endurance, Decreased mobility, Decreased activity tolerance  Visit Diagnosis: Difficulty in walking, not elsewhere classified  History of falling  Pain in left lower leg     Problem List Patient Active Problem List   Diagnosis Date Noted   Primary localized osteoarthritis of left hip 03/30/2019   Status post left hip replacement 03/30/2019    Particia Lather, PT 07/06/2021, 10:23 AM  [facility] [address] [place], [place], [address] Phone: [phone]   Fax:  [phone]  Name: [name] MRN: [id number] Date of Birth:  [birth year]

## 2021-07-09 ENCOUNTER — Ambulatory Visit: Payer: Medicare Other | Admitting: Physical Therapy

## 2021-07-11 ENCOUNTER — Ambulatory Visit: Payer: Medicare Other | Admitting: Physical Therapy

## 2021-07-17 ENCOUNTER — Ambulatory Visit: Payer: Medicare Other | Attending: Surgical | Admitting: Physical Therapy

## 2021-07-17 ENCOUNTER — Other Ambulatory Visit: Payer: Self-pay

## 2021-07-17 ENCOUNTER — Encounter: Payer: Self-pay | Admitting: Physical Therapy

## 2021-07-17 DIAGNOSIS — Z9181 History of falling: Secondary | ICD-10-CM | POA: Diagnosis not present

## 2021-07-17 DIAGNOSIS — R262 Difficulty in walking, not elsewhere classified: Secondary | ICD-10-CM | POA: Insufficient documentation

## 2021-07-17 DIAGNOSIS — M79662 Pain in left lower leg: Secondary | ICD-10-CM | POA: Insufficient documentation

## 2021-07-17 NOTE — Therapy (Signed)
[place] MAIN [facility] SERVICES [address] [place], [place], [address] Phone: [phone]   Fax:  [phone]  Physical Therapy Treatment  Patient Details  Name: [name] MRN: [id number] Date of Birth: 1946-08-05 Referring Provider (PT): Merlene Pulling PA-C   Encounter Date: 07/17/2021   PT End of Session - 07/17/21 0945     Visit Number 15    Number of Visits 24    Date for PT Re-Evaluation 08/13/21    Authorization Type Medicare    Authorization Time Period 05/21/21-08/13/21    Progress Note Due on Visit 20    PT Start Time 0940    PT Stop Time 1015    PT Time Calculation (min) 35 min    Equipment Utilized During Treatment Gait belt    Activity Tolerance Patient tolerated treatment well;Other (comment)    Behavior During Therapy WFL for tasks assessed/performed             Past Medical History:  Diagnosis Date   Allergic rhinitis    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   Heart murmur    mild mitral regurg-echo 2010   Hyperlipidemia    Hypertension    Lupus anticoagulant positive    Obesity    Pre-diabetes    Primary localized osteoarthritis of left hip 03/30/2019   Stroke [facility]) 2010   Some right sided weakness.  Gait issues.   Wears dentures    partial upper    Past Surgical History:  Procedure Laterality Date   ABDOMINAL HYSTERECTOMY  1981   APPENDECTOMY     AXILLARY LYMPH NODE BIOPSY Left 05/16/2014   Procedure: EXCISION 2CM LEFT AXILLARY MASS;  Surgeon: [name], MD;  Location: [place];  Service: General;  Laterality: Left;  Left axillary sebaceous cyst excision   CATARACT EXTRACTION W/PHACO Left 04/11/2020   Procedure: CATARACT EXTRACTION PHACO AND  INTRAOCULAR LENS PLACEMENT ([place]) LEFT DIABETIC TORIC LENS  6.05 00:34.9;  Surgeon: [name], MD;  Location: [place];  Service: Ophthalmology;  Laterality: Left;   CATARACT EXTRACTION W/PHACO Right 12/12/2020   Procedure: CATARACT EXTRACTION PHACO AND INTRAOCULAR LENS PLACEMENT ([place]) RIGHT TORIC LENS;  Surgeon: [name], MD;  Location: [place];  Service: Ophthalmology;  Laterality: Right;  6.55 0:41.1   DILATION AND CURETTAGE OF UTERUS     SKIN CANCER EXCISION  2016   [facility]   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   Procedure: TOTAL HIP ARTHROPLASTY;  Surgeon: [name], MD;  [facility];  Service: Orthopedics;  Laterality: Left;    There were no vitals filed for this visit.   Subjective Assessment - 07/17/21 0942     Subjective Patient reports getting 3rd booster shot a week ago and didn't do well with it. She states it took her a week to get over it. She states today is her first day out. She has been having CNA come assist her.Denies any recent falls;    Pertinent History Pt reports she had a stroke in 2010 and was in inpatient rehab for 4 weeks. Pt reports she has been to therapy about one time a year since the onset of her stroke for various ailments including L THA  in 2020. Pt reports she has bioness L300 which improved her drop foot on her R ankle. Pt has depended on her L LE since the stroke due to her weakness and impaired funciton on the right LE. Since the fall she has had throbbing pain in the left LE which is exacerbated with increased weightbearing activity. Pt reports she has had x rays and it shows nothing is broken but despite this she has had continued pain in the left. Pt reports pain is on the lateral aspect of her left LE up into her posterior thigh. Pt reports doctor thinks this could be from nerve related pain but will need PT in order for her insurance to cover MRI of the lumbar spine. Prior to this fall she fell and broke her  right wrist.  Pt reports she can perform any activities above the level of the wast but she is unable to bend over to complete tasks. Pt reports she has multiple wakers, 3 wheel, 4 wheel, wheelchair, standard rolling walker. Pt had brain MRI following fall and no reports of bleeding or issues.    Limitations Walking    How long can you sit comfortably? a couple hours due to low back pain    How long can you stand comfortably? 15 minutes    How long can you walk comfortably? Able to walk around [employer] but was very fatigued following    Currently in Pain? Yes    Pain Score 5     Pain Location Ankle    Pain Orientation Left    Pain Descriptors / Indicators Dull;Throbbing    Pain Type Chronic pain    Pain Onset More than a month ago    Pain Frequency Constant    Aggravating Factors  stretching of hamstrings; prolonged standing/walking;    Pain Relieving Factors PT intervention    Effect of Pain on Daily Activities decreased activity tolerance;    Multiple Pain Sites No                  Treatment provided this session Required min A to transition to supine position:  Ex:  Patient supine:    2*45 sec hamstring stretch on L with cues to allow therapist to freely movem limb to allow for proper stretch  Progressed to neural flossing with ankle DF/PF slow ROM x10 reps each set;  Piriformis muscular flexibility assessed and pt had mod limitations in muscular flexibility on the left 2* 20 sec piriformis stretch on L (regular and modified, with minimal ROM noted in regular position)  with similar cues to relax and allow therapist to assist with proper stretch     Required min A to transition to sitting position;   Patient presents with Bioness L300 go on RLE for ankle DF during ambulation; She reports its still working well;  NMR:    Walking with 3 wheeled Rollator.  2 x 150 feet.  Contact-guard assist to close supervision; Patient exhibits reciprocal gait pattern, however she does  exhibits decreased heel strike on RLE with increased genu recuvatum during mid stance. She required cues to increase RLE terminal knee extension at initial contact for better quad control and knee stability; She tolerated walking without increase in left ankle pain; Reports mild shortness of breath after gait;        Instructed patient in stance exercise to improve RLE quad control: Standing on firm surface: RLE SLS with LLE hip flexion AROM with 2-1 rail assist x5 reps with heavy  shift towards left side. Patient required min A for balance; RLE SLS on firm surface with LLE toe on 4 inch step:  Standing unsupported 30 sec hold with 2-1 rail assist  Progressed to standing without rail assist with min a to shift towards RLE for better stance control;         Pt educated throughout session about proper posture and technique with exercises. Improved exercise technique, movement at target joints, use of target muscles after min to mod verbal, visual, tactile cues.                              PT Education - 07/17/21 0945     Education Details positioning, strengthening/balance;    Person(s) Educated Patient    Methods Explanation;Verbal cues    Comprehension Verbalized understanding;Returned demonstration;Verbal cues required;Need further instruction              PT Short Term Goals - 06/21/21 1032       PT SHORT TERM GOAL #1   Title Patient will be independent in home exercise program to improve strength/mobility for better functional independence with ADLs.    Baseline Met    Time 3    Period Days    Status Achieved      PT SHORT TERM GOAL #2   Title Pt will be able to complete 6 MWT without requiring rest break or sitting in order to indicate ipmroved aerobic and muscular endurance for everyday tasks.    Baseline 835 feet with 3 wheel walker at PN 9/8    Time 4    Period Weeks    Status Achieved      PT SHORT TERM GOAL #3   Title Patient will  complete five times sit to stand test in < 25 seconds indicating an increased LE strength and improved balance.    Baseline 16 sec 9/8 with UE use    Time 4    Period Weeks    Status Achieved    Target Date 06/18/21               PT Long Term Goals - 06/21/21 1047       PT LONG TERM GOAL #1   Title Pt will improve FOTO score to 60 in order to indicate improvement with everyday tasks    Baseline FOTO: 58 om 9/8 (met initial foto goal)    Time 6    Period Weeks    Status Revised      PT LONG TERM GOAL #2   Title Patient will complete five times sit to stand test in < 13 seconds indicating an increased LE strength and improved balance.    Baseline 5XSTS in 16 sec with UE use 06/21/21    Time 6    Period Weeks    Status Revised      PT LONG TERM GOAL #3   Title Patient will increase six minute walk test distance to >900 feet for progression to improve gait ability and safety with ambulation    Baseline 835 at progress note    Time 8    Period Weeks    Status Partially Met      PT LONG TERM GOAL #4   Title Pt will safely demonstrate ability to navigate up and down 4 stairs without assistance in order to improve ability to navigate stairs in her daily life    Baseline able to ascend 1  stair but too reports fear of descending more than 1 stair    Time 8    Period Weeks    Status New    Target Date 08/16/21                   Plan - 07/17/21 1303     Clinical Impression Statement Patient tolerated session well. She was motivated and participated well within session. She continues to have left lateral ankle/posterior leg pain. This is alleviated with neural stretches and nerve glides. Patient does exhibit increased genu recuvatum on right knee during mid stance due to poor quad control. Instructed patient in right LE stance exercise to challenge control. She does have difficulty shifting weight to RLE especially with reduced UE assist. Patient does fatigue with  prolonged standing. She would benefit from additional skilled PT intervention to improve strength, balance and mobility;    Personal Factors and Comorbidities Age;Comorbidity 1    Examination-Activity Limitations Bend;Carry;Squat;Stairs    Examination-Participation Restrictions Cleaning;Laundry    Stability/Clinical Decision Making Evolving/Moderate complexity    Rehab Potential Good    PT Frequency 2x / week    PT Duration 8 weeks    PT Treatment/Interventions ADLs/Self Care Home Management;Aquatic Therapy;Electrical Stimulation;Gait training;Stair training;Functional mobility training;Therapeutic activities;Therapeutic exercise;Balance training;Neuromuscular re-education;Patient/family education;Prosthetic Training;Manual techniques;Passive range of motion;Energy conservation    PT Next Visit Plan Progress note    PT Home Exercise Plan No updates to HEP this date    Consulted and Agree with Plan of Care Patient             Patient will benefit from skilled therapeutic intervention in order to improve the following deficits and impairments:  Abnormal gait, Decreased balance, Decreased endurance, Decreased mobility, Decreased activity tolerance  Visit Diagnosis: Difficulty in walking, not elsewhere classified  History of falling  Pain in left lower leg     Problem List Patient Active Problem List   Diagnosis Date Noted   Primary localized osteoarthritis of left hip 03/30/2019   Status post left hip replacement 03/30/2019    Barth Trella, PT, DPT 07/17/2021, 2:40 PM  [facility] [address] [place], [place], [address] Phone: [phone]   Fax:  [phone]  Name: JANNET CALIP MRN: [id number] Date of Birth: 23-Nov-1945

## 2021-07-19 ENCOUNTER — Ambulatory Visit: Payer: Medicare Other | Admitting: Physical Therapy

## 2021-07-19 ENCOUNTER — Other Ambulatory Visit: Payer: Self-pay

## 2021-07-19 DIAGNOSIS — R262 Difficulty in walking, not elsewhere classified: Secondary | ICD-10-CM

## 2021-07-19 DIAGNOSIS — Z9181 History of falling: Secondary | ICD-10-CM | POA: Diagnosis not present

## 2021-07-19 DIAGNOSIS — M79662 Pain in left lower leg: Secondary | ICD-10-CM | POA: Diagnosis not present

## 2021-07-19 NOTE — Therapy (Signed)
[place] MAIN [facility] SERVICES [address] [place], [place], [address] Phone: [phone]   Fax:  [phone]  Physical Therapy Treatment  Patient Details  Name: [name] MRN: [id number] Date of Birth: July 09, 1946 Referring Provider (PT): Merlene Pulling PA-C   Encounter Date: 07/19/2021   PT End of Session - 07/19/21 0929     Visit Number 16    Number of Visits 24    Date for PT Re-Evaluation 08/13/21    Authorization Type Medicare    Authorization Time Period 05/21/21-08/13/21    Progress Note Due on Visit 20    Equipment Utilized During Treatment Gait belt    Activity Tolerance Patient tolerated treatment well;Other (comment)    Behavior During Therapy WFL for tasks assessed/performed             Past Medical History:  Diagnosis Date   Allergic rhinitis    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)    Family history of adverse reaction to anesthesia    Daughter was "paralyzed" for several days after 1 surgery.   Heart murmur    mild mitral regurg-echo 2010   Hyperlipidemia    Hypertension    Lupus anticoagulant positive    Obesity    Pre-diabetes    Primary localized osteoarthritis of left hip 03/30/2019   Stroke [facility]) 2010   Some right sided weakness.  Gait issues.   Wears dentures    partial upper    Past Surgical History:  Procedure Laterality Date   ABDOMINAL HYSTERECTOMY  1981   APPENDECTOMY     AXILLARY LYMPH NODE BIOPSY Left 05/16/2014   Procedure: EXCISION 2CM LEFT AXILLARY MASS;  Surgeon: [name], MD;  Location: [place];  Service: General;  Laterality: Left;  Left axillary sebaceous cyst excision   CATARACT EXTRACTION W/PHACO Left 04/11/2020   Procedure: CATARACT EXTRACTION PHACO AND INTRAOCULAR LENS PLACEMENT ([place]) LEFT DIABETIC TORIC LENS  6.05 00:34.9;  Surgeon:  [name], MD;  Location: [place];  Service: Ophthalmology;  Laterality: Left;   CATARACT EXTRACTION W/PHACO Right 12/12/2020   Procedure: CATARACT EXTRACTION PHACO AND INTRAOCULAR LENS PLACEMENT ([place]) RIGHT TORIC LENS;  Surgeon: [name], MD;  Location: [place];  Service: Ophthalmology;  Laterality: Right;  6.55 0:41.1   DILATION AND CURETTAGE OF UTERUS     SKIN CANCER EXCISION  2016   [facility]   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   Procedure: TOTAL HIP ARTHROPLASTY;  Surgeon: [name], MD;  [facility];  Service: Orthopedics;  Laterality: Left;    There were no vitals filed for this visit.   Subjective Assessment - 07/19/21 0921     Subjective Pt reports increase in pain following previous physical therapy session in her L LE. Reports pain is 8/10 and began later in the day tuesday.    Pertinent History Pt reports she had a stroke in 2010 and was in inpatient rehab for 4 weeks. Pt reports she has been to therapy about one time a year since the onset of her stroke for various ailments including L THA in 2020. Pt reports she has bioness L300 which improved her drop foot on her R ankle. Pt has depended on her L LE since the stroke due to her weakness and impaired funciton on the right LE. Since the fall she has  had throbbing pain in the left LE which is exacerbated with increased weightbearing activity. Pt reports she has had x rays and it shows nothing is broken but despite this she has had continued pain in the left. Pt reports pain is on the lateral aspect of her left LE up into her posterior thigh. Pt reports doctor thinks this could be from nerve related pain but will need PT in order for her insurance to cover MRI of the lumbar spine. Prior to this fall she fell and broke her right wrist.  Pt reports she can perform any activities above the level of the wast but she is unable to bend over to complete tasks. Pt reports she has multiple wakers, 3  wheel, 4 wheel, wheelchair, standard rolling walker. Pt had brain MRI following fall and no reports of bleeding or issues.    Limitations Walking    How long can you sit comfortably? a couple hours due to low back pain    How long can you stand comfortably? 15 minutes    How long can you walk comfortably? Able to walk around [employer] but was very fatigued following    Currently in Pain? Yes    Pain Score 8     Pain Location Ankle    Pain Orientation Left    Pain Descriptors / Indicators Dull;Throbbing    Pain Type Chronic pain    Pain Onset More than a month ago    Pain Frequency Constant    Aggravating Factors  prolonged standing and walking    Pain Relieving Factors PT intervention    Effect of Pain on Daily Activities decreased activity tolerance             Treatment provided this session                    There Act:   Ambulation training- Walking with 3 wheeled Rollator.  1 x 300 feet.  Contact-guard assist.  -Patient reported improvement in signs and symptoms of left lower extremity pain with ambulation today following manual therapy.  -Cues for increased step length bilaterally and improvement in shift to the right side.    stair training   -10 times step up leading with right lower extremity utilizing bilateral upper extremity support in parallel bars to 6 inch step. -10 times step up and step down leading with right lower extremity step up and left lower extremity on stepdown with bilateral upper extremity support in parallel bars.  Patient utilized significant upper extremity support but did not have loss of balance demonstrated good control with this ascending and descending stairs.           Manual Therapy:     Manual therapy: performed with pt in supine position    2*45 sec hamstring stretch on L with cues to allow therapist to freely move limb to allow for proper stretch -Minimal popping noted following second stretch.  Patient reported no pain  and it actually felt good.   STM to distal peronael leg musculature on the L LE, pt reported comparabl sign with palpation of this musculature -petrissage, cross friction massage utilized interchangeably   -X10 minutes, patient reported improvement in signs and symptoms with pain in the left lower extremity following this particular manual therapy technique. -pt also reports with palpation to this area s/s of pain ascending her LE into the posterior aspect of her LE         Pt educated throughout session about  proper posture and technique with exercises. Improved exercise technique, movement at target joints, use of target muscles after min to mod verbal, visual, tactile cues.                           PT Education - 07/19/21 0928     Education Details proper technique for stair navigation    Person(s) Educated Patient    Methods Explanation;Verbal cues    Comprehension Verbalized understanding;Returned demonstration;Verbal cues required;Tactile cues required;Need further instruction              PT Short Term Goals - 06/21/21 1032       PT SHORT TERM GOAL #1   Title Patient will be independent in home exercise program to improve strength/mobility for better functional independence with ADLs.    Baseline Met    Time 3    Period Days    Status Achieved      PT SHORT TERM GOAL #2   Title Pt will be able to complete 6 MWT without requiring rest break or sitting in order to indicate ipmroved aerobic and muscular endurance for everyday tasks.    Baseline 835 feet with 3 wheel walker at PN 9/8    Time 4    Period Weeks    Status Achieved      PT SHORT TERM GOAL #3   Title Patient will complete five times sit to stand test in < 25 seconds indicating an increased LE strength and improved balance.    Baseline 16 sec 9/8 with UE use    Time 4    Period Weeks    Status Achieved    Target Date 06/18/21               PT Long Term Goals - 06/21/21  1047       PT LONG TERM GOAL #1   Title Pt will improve FOTO score to 60 in order to indicate improvement with everyday tasks    Baseline FOTO: 58 om 9/8 (met initial foto goal)    Time 6    Period Weeks    Status Revised      PT LONG TERM GOAL #2   Title Patient will complete five times sit to stand test in < 13 seconds indicating an increased LE strength and improved balance.    Baseline 5XSTS in 16 sec with UE use 06/21/21    Time 6    Period Weeks    Status Revised      PT LONG TERM GOAL #3   Title Patient will increase six minute walk test distance to >900 feet for progression to improve gait ability and safety with ambulation    Baseline 835 at progress note    Time 8    Period Weeks    Status Partially Met      PT LONG TERM GOAL #4   Title Pt will safely demonstrate ability to navigate up and down 4 stairs without assistance in order to improve ability to navigate stairs in her daily life    Baseline able to ascend 1 stair but too reports fear of descending more than 1 stair    Time 8    Period Weeks    Status New    Target Date 08/16/21                   Plan - 07/19/21 0937     Clinical Impression Statement  Patient tolerated treatment session well.  Patient continues to demonstrate improvement in pain levels following manual therapy.  Patient reported increase in pain following last session.  Patient continues to fatigue easily with prolonged standing and ambulatory tasks.  Able to perform step up and step down activity in order to minimize stair navigation and with cues patient able to tolerate without significant difficulty.  Patient continue to benefit from skilled physical therapy inventions to improve strengthening, balance, mobility, and improve her pain levels in her left lower extremity.    Personal Factors and Comorbidities Age;Comorbidity 1    Examination-Activity Limitations Bend;Carry;Squat;Stairs    Examination-Participation Restrictions  Cleaning;Laundry    Stability/Clinical Decision Making Evolving/Moderate complexity    Rehab Potential Good    PT Frequency 2x / week    PT Duration 8 weeks    PT Treatment/Interventions ADLs/Self Care Home Management;Aquatic Therapy;Electrical Stimulation;Gait training;Stair training;Functional mobility training;Therapeutic activities;Therapeutic exercise;Balance training;Neuromuscular re-education;Patient/family education;Prosthetic Training;Manual techniques;Passive range of motion;Energy conservation    PT Next Visit Plan Progress note    PT Home Exercise Plan No updates to HEP this date    Consulted and Agree with Plan of Care Patient             Patient will benefit from skilled therapeutic intervention in order to improve the following deficits and impairments:  Abnormal gait, Decreased balance, Decreased endurance, Decreased mobility, Decreased activity tolerance  Visit Diagnosis: Difficulty in walking, not elsewhere classified  History of falling  Pain in left lower leg     Problem List Patient Active Problem List   Diagnosis Date Noted   Primary localized osteoarthritis of left hip 03/30/2019   Status post left hip replacement 03/30/2019    Particia Lather, PT 07/19/2021, 9:41 AM  [facility] [address] [place], [place], [address] Phone: [phone]   Fax:  [phone]  Name: [name] MRN: [id number] Date of Birth: July 07, 1946

## 2021-07-23 DIAGNOSIS — Z23 Encounter for immunization: Secondary | ICD-10-CM | POA: Diagnosis not present

## 2021-07-24 ENCOUNTER — Ambulatory Visit: Payer: Medicare Other | Admitting: Physical Therapy

## 2021-07-26 ENCOUNTER — Ambulatory Visit: Payer: Medicare Other | Admitting: Physical Therapy

## 2021-07-26 ENCOUNTER — Other Ambulatory Visit: Payer: Self-pay

## 2021-07-26 DIAGNOSIS — Z9181 History of falling: Secondary | ICD-10-CM

## 2021-07-26 DIAGNOSIS — M79662 Pain in left lower leg: Secondary | ICD-10-CM

## 2021-07-26 DIAGNOSIS — R262 Difficulty in walking, not elsewhere classified: Secondary | ICD-10-CM

## 2021-07-26 NOTE — Therapy (Addendum)
[place] MAIN [facility] SERVICES [address] [place], [place], [address] Phone: [phone]   Fax:  [phone]  Physical Therapy Treatment  Patient Details  Name: [name] MRN: [id number] Date of Birth: Oct 19, 1945 Referring Provider (PT): Merlene Pulling PA-C   Encounter Date: 07/26/2021   PT End of Session - 07/26/21 1310     Visit Number 17    Number of Visits 24    Date for PT Re-Evaluation 08/13/21    Authorization Type Medicare    Authorization Time Period 05/21/21-08/13/21    Progress Note Due on Visit 20    PT Start Time 0948    PT Stop Time 1029    PT Time Calculation (min) 41 min    Equipment Utilized During Treatment Gait belt    Activity Tolerance Patient tolerated treatment well;Other (comment)    Behavior During Therapy WFL for tasks assessed/performed             Past Medical History:  Diagnosis Date   Allergic rhinitis    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   Heart murmur    mild mitral regurg-echo 2010   Hyperlipidemia    Hypertension    Lupus anticoagulant positive    Obesity    Pre-diabetes    Primary localized osteoarthritis of left hip 03/30/2019   Stroke [facility]) 2010   Some right sided weakness.  Gait issues.   Wears dentures    partial upper    Past Surgical History:  Procedure Laterality Date   ABDOMINAL HYSTERECTOMY  1981   APPENDECTOMY     AXILLARY LYMPH NODE BIOPSY Left 05/16/2014   Procedure: EXCISION 2CM LEFT AXILLARY MASS;  Surgeon: [name], MD;  Location: [place];  Service: General;  Laterality: Left;  Left axillary sebaceous cyst excision   CATARACT EXTRACTION W/PHACO Left 04/11/2020   Procedure: CATARACT EXTRACTION PHACO AND  INTRAOCULAR LENS PLACEMENT ([place]) LEFT DIABETIC TORIC LENS  6.05 00:34.9;  Surgeon: [name], MD;  Location: [place];  Service: Ophthalmology;  Laterality: Left;   CATARACT EXTRACTION W/PHACO Right 12/12/2020   Procedure: CATARACT EXTRACTION PHACO AND INTRAOCULAR LENS PLACEMENT ([place]) RIGHT TORIC LENS;  Surgeon: [name], MD;  Location: [place];  Service: Ophthalmology;  Laterality: Right;  6.55 0:41.1   DILATION AND CURETTAGE OF UTERUS     SKIN CANCER EXCISION  2016   [place]   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   Procedure: TOTAL HIP ARTHROPLASTY;  Surgeon: [name], MD;  [facility];  Service: Orthopedics;  Laterality: Left;    There were no vitals filed for this visit.   Subjective Assessment - 07/26/21 0852     Subjective Pt reports feeling bad following her flu shot earlier this week. Pt reports medium discomfort on the left side this session. Pt reports she has had continued pain and disocmofrt in the left side. Ptreports she was able to complete community based activities including going to her grandaughter's soccer game and cracker barel this past weekend.    Pertinent History Pt reports she had a stroke in 2010 and was in inpatient rehab for 4 weeks. Pt reports she has been to therapy about one time a year since  the onset of her stroke for various ailments including L THA in 2020. Pt reports she has bioness L300 which improved her drop foot on her R ankle. Pt has depended on her L LE since the stroke due to her weakness and impaired funciton on the right LE. Since the fall she has had throbbing pain in the left LE which is exacerbated with increased weightbearing activity. Pt reports she has had x rays and it shows nothing is broken but despite this she has had continued pain in the left. Pt reports pain is on the lateral aspect of her left LE up into her posterior thigh. Pt reports doctor thinks this could be from nerve related pain but  will need PT in order for her insurance to cover MRI of the lumbar spine. Prior to this fall she fell and broke her right wrist.  Pt reports she can perform any activities above the level of the wast but she is unable to bend over to complete tasks. Pt reports she has multiple wakers, 3 wheel, 4 wheel, wheelchair, standard rolling walker. Pt had brain MRI following fall and no reports of bleeding or issues.    Limitations Walking    How long can you sit comfortably? a couple hours due to low back pain    How long can you stand comfortably? 15 minutes    How long can you walk comfortably? Able to walk around [employer] but was very fatigued following    Currently in Pain? Yes    Pain Score 6     Pain Location Ankle    Pain Orientation Left    Pain Descriptors / Indicators Dull;Throbbing    Pain Type Chronic pain    Pain Onset More than a month ago    Pain Frequency Constant    Multiple Pain Sites No               Treatment provided this session  Therex:   Peroneal nerve glides in seated position x 15 repetitions, pt required frequent cues for proper performance.  -seated with left knee propped into extension. PF and inversion followed by DF and eversion to allow nerve glide.   Step up and down for stair training navigation -x 8 with L LE as stance limb  -Significant UE usage  Stair navigation: at house ascending wall on right and rail on left     Ambulation training:  -4:00 350 feet, cues for increased step length on the left side.  -min fatigue noted following     Manual Therapy:  Manual therapy: performed with pt in supine position     2*45 sec hamstring stretch on L with cues to allow therapist to freely move limb to allow for proper stretch -Minimal popping noted following second stretch.  Patient reported no pain and it actually felt good.   STM to distal peronael leg musculature on the L LE, pt reported comparable sign with palpation of this  musculature -petrissage, cross friction massage, trigger point release utilized interchangeably   -X10 minutes, patient reported improvement in signs and symptoms with pain in the left lower extremity following this particular manual therapy technique. -pt also reports with palpation to this area s/s of pain ascending her LE into the posterior aspect of her LE    Pt required occasional rest breaks due fatigue, PT was quick to ask when pt appeared to be fatiguing in order to prevent excessive fatigue.  Pt educated throughout session about proper posture and technique  with exercises. Improved exercise technique, movement at target joints, use of target muscles after min to mod verbal, visual, tactile cues.                          PT Education - 07/26/21 1309     Education Details Instructions with HEP    Person(s) Educated Patient    Methods Explanation;Verbal cues    Comprehension Verbalized understanding;Returned demonstration;Verbal cues required;Tactile cues required;Need further instruction              PT Short Term Goals - 06/21/21 1032       PT SHORT TERM GOAL #1   Title Patient will be independent in home exercise program to improve strength/mobility for better functional independence with ADLs.    Baseline Met    Time 3    Period Days    Status Achieved      PT SHORT TERM GOAL #2   Title Pt will be able to complete 6 MWT without requiring rest break or sitting in order to indicate ipmroved aerobic and muscular endurance for everyday tasks.    Baseline 835 feet with 3 wheel walker at PN 9/8    Time 4    Period Weeks    Status Achieved      PT SHORT TERM GOAL #3   Title Patient will complete five times sit to stand test in < 25 seconds indicating an increased LE strength and improved balance.    Baseline 16 sec 9/8 with UE use    Time 4    Period Weeks    Status Achieved    Target Date 06/18/21               PT Long Term Goals -  06/21/21 1047       PT LONG TERM GOAL #1   Title Pt will improve FOTO score to 60 in order to indicate improvement with everyday tasks    Baseline FOTO: 58 om 9/8 (met initial foto goal)    Time 6    Period Weeks    Status Revised      PT LONG TERM GOAL #2   Title Patient will complete five times sit to stand test in < 13 seconds indicating an increased LE strength and improved balance.    Baseline 5XSTS in 16 sec with UE use 06/21/21    Time 6    Period Weeks    Status Revised      PT LONG TERM GOAL #3   Title Patient will increase six minute walk test distance to >900 feet for progression to improve gait ability and safety with ambulation    Baseline 835 at progress note    Time 8    Period Weeks    Status Partially Met      PT LONG TERM GOAL #4   Title Pt will safely demonstrate ability to navigate up and down 4 stairs without assistance in order to improve ability to navigate stairs in her daily life    Baseline able to ascend 1 stair but too reports fear of descending more than 1 stair    Time 8    Period Weeks    Status New    Target Date 08/16/21                   Plan - 07/26/21 1311     Clinical Impression Statement Patient continues to demonstrate good motivation during  physical therapy sessions.  Patient reports improved left lower extremity pain at beginning of physical therapy session as compared to previous sessions.  Patient also continues to report improvement in signs and symptoms of left ankle and lower extremity pain following manual therapy as described in this note.  Patient will continue to benefit from skilled physical therapy services in order to improve her lower extremity strength, improve her ability to navigate stairs, and improve her signs symptoms of pain in her left lower extremity which limit her and ability to ambulate prolonged distances or stand for prolonged periods.    Personal Factors and Comorbidities Age;Comorbidity 1     Examination-Activity Limitations Bend;Carry;Squat;Stairs    Examination-Participation Restrictions Cleaning;Laundry    Stability/Clinical Decision Making Evolving/Moderate complexity    Rehab Potential Good    PT Frequency 2x / week    PT Duration 8 weeks    PT Treatment/Interventions ADLs/Self Care Home Management;Aquatic Therapy;Electrical Stimulation;Gait training;Stair training;Functional mobility training;Therapeutic activities;Therapeutic exercise;Balance training;Neuromuscular re-education;Patient/family education;Prosthetic Training;Manual techniques;Passive range of motion;Energy conservation    PT Next Visit Plan Contine stair training (rail on left ascending)    PT Home Exercise Plan No updates to HEP this date    Consulted and Agree with Plan of Care Patient             Patient will benefit from skilled therapeutic intervention in order to improve the following deficits and impairments:  Abnormal gait, Decreased balance, Decreased endurance, Decreased mobility, Decreased activity tolerance  Visit Diagnosis: Difficulty in walking, not elsewhere classified  History of falling  Pain in left lower leg     Problem List Patient Active Problem List   Diagnosis Date Noted   Primary localized osteoarthritis of left hip 03/30/2019   Status post left hip replacement 03/30/2019    Particia Lather, PT 07/26/2021, 1:28 PM  [facility] [address] [place], [place], [address] Phone: [phone]   Fax:  [phone]  Name: [name] MRN: [id number] Date of Birth: 04/10/1946

## 2021-07-31 ENCOUNTER — Other Ambulatory Visit: Payer: Self-pay

## 2021-07-31 ENCOUNTER — Ambulatory Visit: Payer: Medicare Other

## 2021-07-31 DIAGNOSIS — M79662 Pain in left lower leg: Secondary | ICD-10-CM

## 2021-07-31 DIAGNOSIS — Z9181 History of falling: Secondary | ICD-10-CM | POA: Diagnosis not present

## 2021-07-31 DIAGNOSIS — R262 Difficulty in walking, not elsewhere classified: Secondary | ICD-10-CM | POA: Diagnosis not present

## 2021-07-31 NOTE — Therapy (Signed)
[place] MAIN [facility] SERVICES [address] [place], [place], [address] Phone: [phone]   Fax:  [phone]  Physical Therapy Treatment  Patient Details  Name: [name] MRN: [id number] Date of Birth: [birth year] Referring Provider (PT): Merlene Pulling PA-C   Encounter Date: 07/31/2021   PT End of Session - 07/31/21 0947     Visit Number 18    Number of Visits 24    Date for PT Re-Evaluation 08/13/21    Authorization Type Medicare    Authorization Time Period 05/21/21-08/13/21    Progress Note Due on Visit 20    PT Start Time 0912    PT Stop Time 0952    PT Time Calculation (min) 40 min             Past Medical History:  Diagnosis Date   Allergic rhinitis    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   Heart murmur    mild mitral regurg-echo 2010   Hyperlipidemia    Hypertension    Lupus anticoagulant positive    Obesity    Pre-diabetes    Primary localized osteoarthritis of left hip 03/30/2019   Stroke [facility]) 2010   Some right sided weakness.  Gait issues.   Wears dentures    partial upper    Past Surgical History:  Procedure Laterality Date   ABDOMINAL HYSTERECTOMY  1981   APPENDECTOMY     AXILLARY LYMPH NODE BIOPSY Left 05/16/2014   Procedure: EXCISION 2CM LEFT AXILLARY MASS;  Surgeon: [name], MD;  Location: [place];  Service: General;  Laterality: Left;  Left axillary sebaceous cyst excision   CATARACT EXTRACTION W/PHACO Left 04/11/2020   Procedure: CATARACT EXTRACTION PHACO AND INTRAOCULAR LENS PLACEMENT ([place]) LEFT DIABETIC TORIC LENS  6.05 00:34.9;  Surgeon: [name], MD;  Location: [place];  Service: Ophthalmology;  Laterality: Left;    CATARACT EXTRACTION W/PHACO Right 12/12/2020   Procedure: CATARACT EXTRACTION PHACO AND INTRAOCULAR LENS PLACEMENT ([place]) RIGHT TORIC LENS;  Surgeon: [name], MD;  Location: [place];  Service: Ophthalmology;  Laterality: Right;  6.55 0:41.1   DILATION AND CURETTAGE OF UTERUS     SKIN CANCER EXCISION  2016   [place]   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   Procedure: TOTAL HIP ARTHROPLASTY;  Surgeon: [name], MD;  [facility];  Service: Orthopedics;  Laterality: Left;    There were no vitals filed for this visit.   Subjective Assessment - 07/31/21 0918     Subjective Pt reports continued pain in left from Left lateral ankle to lef thip 7-8/10. Pt reports conitnued subjective relief from manual therapies, down to 2/1-0, but this tends to last only several hours, pain increasing back by the evening. No other updates to medications or MD visits.    Pertinent History Pt reports she had a stroke in 2010 and was in inpatient rehab for 4 weeks. Pt reports she has been to therapy about one time a year since the onset of her stroke for various ailments including L THA in 2020. Pt reports she has bioness L300 which improved her drop foot on her R ankle. Pt has depended on her L LE since the stroke  due to her weakness and impaired funciton on the right LE. Since the fall she has had throbbing pain in the left LE which is exacerbated with increased weightbearing activity. Pt reports she has had x rays and it shows nothing is broken but despite this she has had continued pain in the left. Pt reports pain is on the lateral aspect of her left LE up into her posterior thigh. Pt reports doctor thinks this could be from nerve related pain but will need PT in order for her insurance to cover MRI of the lumbar spine. Prior to this fall she fell and broke her right wrist.  Pt reports she can perform any activities above the level of the wast but she is unable to bend over to complete tasks. Pt  reports she has multiple wakers, 3 wheel, 4 wheel, wheelchair, standard rolling walker. Pt had brain MRI following fall and no reports of bleeding or issues.              INTERVENTION -pt moved to supine, supervision level, knees on bolster  Orthopedic Ankle Screening -ROM assessment of left ankle: full ROM, no pain at end range -Ankle resistance testing, 5/5 in EV, IV, DF, pain free -Mild ankle joint edema about the pathway of the fibularis tendons and lateral talus  -Syndesmotic Squeeze test with localized fibularis belly pain in 2nd testing place, otherwise pain free -Comparments: hypertonic and tender tib anterior, fibularis group, and posterior tibial group; calf is soft and absent any obvious taut bands -soft and well developed fluid collection in the popliteal fossa   -Left hip ER ROM: 32 degrees; Flexion to 100 degrees without end feel (no further given posterior hip precautions); ABDCT: to 35 degrees with long adductor stretch  -MFR, sustained release to Left gluteus medius x10 minutes, improved reduction in hypertonic state, reduced tenderness, referral to low back; gluteus minimus (anterior portion) with referral down lateral thigh to lateral ankle. Good resolution of pain after release work.   -Supine Bilat hip ABDCT heel slides 2x15x3secH with RedTB resistance -Supine to sitting EOB  Pain on arrival: 7-8/10 Post intervention pain in supine: 5/10 Post intervention standing pain: 2-3/10.        PT Short Term Goals - 06/21/21 1032       PT SHORT TERM GOAL #1   Title Patient will be independent in home exercise program to improve strength/mobility for better functional independence with ADLs.    Baseline Met    Time 3    Period Days    Status Achieved      PT SHORT TERM GOAL #2   Title Pt will be able to complete 6 MWT without requiring rest break or sitting in order to indicate ipmroved aerobic and muscular endurance for everyday tasks.    Baseline 835 feet  with 3 wheel walker at PN 9/8    Time 4    Period Weeks    Status Achieved      PT SHORT TERM GOAL #3   Title Patient will complete five times sit to stand test in < 25 seconds indicating an increased LE strength and improved balance.    Baseline 16 sec 9/8 with UE use    Time 4    Period Weeks    Status Achieved    Target Date 06/18/21               PT Long Term Goals - 06/21/21 1047       PT LONG TERM  GOAL #1   Title Pt will improve FOTO score to 60 in order to indicate improvement with everyday tasks    Baseline FOTO: 58 om 9/8 (met initial foto goal)    Time 6    Period Weeks    Status Revised      PT LONG TERM GOAL #2   Title Patient will complete five times sit to stand test in < 13 seconds indicating an increased LE strength and improved balance.    Baseline 5XSTS in 16 sec with UE use 06/21/21    Time 6    Period Weeks    Status Revised      PT LONG TERM GOAL #3   Title Patient will increase six minute walk test distance to >900 feet for progression to improve gait ability and safety with ambulation    Baseline 835 at progress note    Time 8    Period Weeks    Status Partially Met      PT LONG TERM GOAL #4   Title Pt will safely demonstrate ability to navigate up and down 4 stairs without assistance in order to improve ability to navigate stairs in her daily life    Baseline able to ascend 1 stair but too reports fear of descending more than 1 stair    Time 8    Period Weeks    Status New    Target Date 08/16/21                   Plan - 07/31/21 0951     Clinical Impression Statement Continued MFR to hypertonic lower leg muscles, also screening ankle for orthopedic injury (negative) and lateral gluteals, both of which are significantly tender and tight (medius > minimus); Minimus does cause some referral into Left lower lateral leg, but pain only decreases from 7-8/10 to ~5/10 after sustained release. Pt also has significant ROM limitations in  Left hip ER ~32 degrees. At this point, anticipated more relief. Also curious pt has less than 12 hours symptoms relief post intervention which may point to a more severe origin of these areas of muscles tightness- cannot r/o spine etiology at this time. Pt has a few visits remaining, then will make a decision regarding continue of services v referring back to orthopedics for further assessment.    Personal Factors and Comorbidities Age;Comorbidity 1    Examination-Activity Limitations Bend;Carry;Squat;Stairs    Examination-Participation Restrictions Cleaning;Laundry    Stability/Clinical Decision Making Evolving/Moderate complexity    Clinical Decision Making Moderate    Rehab Potential Good    PT Frequency 2x / week    PT Duration 8 weeks    PT Treatment/Interventions ADLs/Self Care Home Management;Aquatic Therapy;Electrical Stimulation;Gait training;Stair training;Functional mobility training;Therapeutic activities;Therapeutic exercise;Balance training;Neuromuscular re-education;Patient/family education;Prosthetic Training;Manual techniques;Passive range of motion;Energy conservation    PT Next Visit Plan Contine stair training (rail on left ascending)    PT Home Exercise Plan No updates to HEP this date    Consulted and Agree with Plan of Care Patient             Patient will benefit from skilled therapeutic intervention in order to improve the following deficits and impairments:  Abnormal gait, Decreased balance, Decreased endurance, Decreased mobility, Decreased activity tolerance  Visit Diagnosis: Difficulty in walking, not elsewhere classified  History of falling  Pain in left lower leg     Problem List Patient Active Problem List   Diagnosis Date Noted   Primary localized osteoarthritis of left  hip 03/30/2019   Status post left hip replacement 03/30/2019   10:34 AM, 07/31/21 Etta Grandchild, PT, DPT Physical Therapist - [facility]   Outpatient Physical Therapy- [place] [phone]     [place], [place] 07/31/2021, 10:23 AM  [place] [facility] SERVICES [address] [place], [place], [address] Phone: [phone]   Fax:  [phone]  Name: [name] MRN: [id number] Date of Birth: May 26, 1946

## 2021-08-02 ENCOUNTER — Other Ambulatory Visit: Payer: Self-pay

## 2021-08-02 ENCOUNTER — Ambulatory Visit: Payer: Medicare Other | Admitting: Physical Therapy

## 2021-08-02 ENCOUNTER — Encounter: Payer: Self-pay | Admitting: Physical Therapy

## 2021-08-02 DIAGNOSIS — R262 Difficulty in walking, not elsewhere classified: Secondary | ICD-10-CM

## 2021-08-02 DIAGNOSIS — M79662 Pain in left lower leg: Secondary | ICD-10-CM

## 2021-08-02 DIAGNOSIS — Z9181 History of falling: Secondary | ICD-10-CM

## 2021-08-02 NOTE — Therapy (Signed)
[place] [facility] [facility] SERVICES [address] [place], [place], [address] Phone: [phone]   Fax:  [phone]  Physical Therapy Treatment  Patient Details  Name: [name] MRN: [id number] Date of Birth: 09-29-1946 Referring Provider (PT): Merlene Pulling PA-C   Encounter Date: 08/02/2021   PT End of Session - 08/02/21 0911     Visit Number 19    Number of Visits 24    Date for PT Re-Evaluation 08/13/21    Authorization Type Medicare    Authorization Time Period 05/21/21-08/13/21    Progress Note Due on Visit 20    PT Start Time 0852    PT Stop Time 0930    PT Time Calculation (min) 38 min             Past Medical History:  Diagnosis Date   Allergic rhinitis    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   Heart murmur    mild mitral regurg-echo 2010   Hyperlipidemia    Hypertension    Lupus anticoagulant positive    Obesity    Pre-diabetes    Primary localized osteoarthritis of left hip 03/30/2019   Stroke [facility]) 2010   Some right sided weakness.  Gait issues.   Wears dentures    partial upper    Past Surgical History:  Procedure Laterality Date   ABDOMINAL HYSTERECTOMY  1981   APPENDECTOMY     AXILLARY LYMPH NODE BIOPSY Left 05/16/2014   Procedure: EXCISION 2CM LEFT AXILLARY MASS;  Surgeon: [name], MD;  Location: [place];  Service: General;  Laterality: Left;  Left axillary sebaceous cyst excision   CATARACT EXTRACTION W/PHACO Left 04/11/2020   Procedure: CATARACT EXTRACTION PHACO AND INTRAOCULAR LENS PLACEMENT ([place]) LEFT DIABETIC TORIC LENS  6.05 00:34.9;  Surgeon: [name], MD;  Location: [place];  Service: Ophthalmology;  Laterality: Left;    CATARACT EXTRACTION W/PHACO Right 12/12/2020   Procedure: CATARACT EXTRACTION PHACO AND INTRAOCULAR LENS PLACEMENT ([place]) RIGHT TORIC LENS;  Surgeon: [name], MD;  Location: [place];  Service: Ophthalmology;  Laterality: Right;  6.55 0:41.1   DILATION AND CURETTAGE OF UTERUS     SKIN CANCER EXCISION  2016   [facility]   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   Procedure: TOTAL HIP ARTHROPLASTY;  Surgeon: [name], MD;  [facility];  Service: Orthopedics;  Laterality: Left;    There were no vitals filed for this visit.   Subjective Assessment - 08/02/21 0854     Subjective Pt reports continued pain in left from Left lateral ankle to lef thip 7-8/10. Pt reports conitnued subjective relief from manual therapies, down to 2/1-0, but this tends to last only several hours, pain increasing back by the evening. No other updates to medications or MD visits.    Pertinent History Pt reports she had a stroke in 2010 and was in inpatient rehab for 4 weeks. Pt reports she has been to therapy about one time a year since the onset of her stroke for various ailments including L THA in 2020. Pt reports she has bioness L300 which improved her drop foot on her R ankle. Pt has depended on her L LE since the stroke  due to her weakness and impaired funciton on the right LE. Since the fall she has had throbbing pain in the left LE which is exacerbated with increased weightbearing activity. Pt reports she has had x rays and it shows nothing is broken but despite this she has had continued pain in the left. Pt reports pain is on the lateral aspect of her left LE up into her posterior thigh. Pt reports doctor thinks this could be from nerve related pain but will need PT in order for her insurance to cover MRI of the lumbar spine. Prior to this fall she fell and broke her right wrist.  Pt reports she can perform any activities above the level of the wast but she is unable to bend over to complete tasks. Pt  reports she has multiple wakers, 3 wheel, 4 wheel, wheelchair, standard rolling walker. Pt had brain MRI following fall and no reports of bleeding or issues.    Limitations Walking    How long can you sit comfortably? a couple hours due to low back pain    How long can you stand comfortably? 15 minutes    How long can you walk comfortably? Able to walk around [employer] but was very fatigued following    Currently in Pain? Yes    Pain Score 4     Pain Location Ankle    Pain Orientation Left    Pain Descriptors / Indicators Dull    Pain Type Chronic pain    Pain Onset More than a month ago    Pain Frequency Constant    Aggravating Factors  prolonged standing and walking    Pain Relieving Factors PT intervention    Effect of Pain on Daily Activities decreased activity tolerance    Multiple Pain Sites No             Treatment provided this session  Therex:   Clamshells 2 x 10 with 5 sec holds RTB  Supine, assistance donning and doffing TB  Standing TFL/ glute med stretch  2 x 30 sec  -Mod to max cueing for improved stretch form.  Patient unable to attain appropriate position with left lower extremity posterior and lateral to right lower extremity.  Had to modify with tandem stance and significant upper extremity support.  Patient reports she did not feel appropriate stretch in this position and felt it more on the right side compared to the left.  Further assessment of this stretch or alterations may be beneficial during next session.  Standing hip hike with L LE on 2 inch step 1 x 10 -fatigue noted in left hip -Cues for proper set up and for proper movement and decreased upper extremity support throughout exercise.   Standing hip abduction RTB 2 x 10  Cues for proper hip alignment throughout exercise.     Manual Therapy:  MFRE and STM to Glute med and min x 8 min   Piriformis/ glute stretch 2 x 45 sec   Attmepted R SL for manual therapy but pt unable to obtain this  position  Significant improvement in symptoms noted following manual therapy and today's session.    Pt educated throughout session about proper posture and technique with exercises. Improved exercise technique, movement at target joints, use of target muscles after min to mod verbal, visual, tactile cues.                            PT Education -  08/02/21 1038     Education Details Importance of home exercise program and education regarding future plan of care    Person(s) Educated Patient    Methods Explanation;Verbal cues    Comprehension Verbalized understanding;Returned demonstration;Verbal cues required;Need further instruction              PT Short Term Goals - 06/21/21 1032       PT SHORT TERM GOAL #1   Title Patient will be independent in home exercise program to improve strength/mobility for better functional independence with ADLs.    Baseline Met    Time 3    Period Days    Status Achieved      PT SHORT TERM GOAL #2   Title Pt will be able to complete 6 MWT without requiring rest break or sitting in order to indicate ipmroved aerobic and muscular endurance for everyday tasks.    Baseline 835 feet with 3 wheel walker at PN 9/8    Time 4    Period Weeks    Status Achieved      PT SHORT TERM GOAL #3   Title Patient will complete five times sit to stand test in < 25 seconds indicating an increased LE strength and improved balance.    Baseline 16 sec 9/8 with UE use    Time 4    Period Weeks    Status Achieved    Target Date 06/18/21               PT Long Term Goals - 06/21/21 1047       PT LONG TERM GOAL #1   Title Pt will improve FOTO score to 60 in order to indicate improvement with everyday tasks    Baseline FOTO: 58 om 9/8 (met initial foto goal)    Time 6    Period Weeks    Status Revised      PT LONG TERM GOAL #2   Title Patient will complete five times sit to stand test in < 13 seconds indicating an increased LE  strength and improved balance.    Baseline 5XSTS in 16 sec with UE use 06/21/21    Time 6    Period Weeks    Status Revised      PT LONG TERM GOAL #3   Title Patient will increase six minute walk test distance to >900 feet for progression to improve gait ability and safety with ambulation    Baseline 835 at progress note    Time 8    Period Weeks    Status Partially Met      PT LONG TERM GOAL #4   Title Pt will safely demonstrate ability to navigate up and down 4 stairs without assistance in order to improve ability to navigate stairs in her daily life    Baseline able to ascend 1 stair but too reports fear of descending more than 1 stair    Time 8    Period Weeks    Status New    Target Date 08/16/21                   Plan - 08/02/21 0912     Clinical Impression Statement Continued with soft tissue mobilization and MFR to left sided tight musculature.  Patient noted that with MFR and STM to left gluteus medius and minimus at and alleviated the pain traveled down her left lower extremity.  Began several hip strengthening exercises to target gluteus medius and minimus  and patient reported improved pain following completion of these exercises and manual therapy.  Patient will be further educated and reviewed with these exercises and provided a formal exercise program for her to continue on her own at the next physical therapy session.  Patient continue to benefit from skilled physical therapy services in order to improve her lower extremity strength, improve her pain, improve her balance, improve her mobility and overall quality of life.    Personal Factors and Comorbidities Age;Comorbidity 1    Examination-Activity Limitations Bend;Carry;Squat;Stairs    Examination-Participation Restrictions Cleaning;Laundry    Stability/Clinical Decision Making Evolving/Moderate complexity    Rehab Potential Good    PT Frequency 2x / week    PT Duration 8 weeks    PT Treatment/Interventions  ADLs/Self Care Home Management;Aquatic Therapy;Electrical Stimulation;Gait training;Stair training;Functional mobility training;Therapeutic activities;Therapeutic exercise;Balance training;Neuromuscular re-education;Patient/family education;Prosthetic Training;Manual techniques;Passive range of motion;Energy conservation    PT Next Visit Plan Contine stair training (rail on left ascending)    PT Home Exercise Plan No updates to HEP this date    Consulted and Agree with Plan of Care Patient             Patient will benefit from skilled therapeutic intervention in order to improve the following deficits and impairments:  Abnormal gait, Decreased balance, Decreased endurance, Decreased mobility, Decreased activity tolerance  Visit Diagnosis: Difficulty in walking, not elsewhere classified  History of falling  Pain in left lower leg     Problem List Patient Active Problem List   Diagnosis Date Noted   Primary localized osteoarthritis of left hip 03/30/2019   Status post left hip replacement 03/30/2019    Particia Lather, PT 08/02/2021, 10:40 AM  [place] [address] [place], [place], [address] Phone: [phone]   Fax:  [phone]  Name: JAMYRA ZWEIG MRN: [id number] Date of Birth: 09/21/1946

## 2021-08-07 ENCOUNTER — Ambulatory Visit: Payer: Medicare Other | Admitting: Physical Therapy

## 2021-08-07 ENCOUNTER — Other Ambulatory Visit: Payer: Self-pay

## 2021-08-07 ENCOUNTER — Encounter: Payer: Self-pay | Admitting: Physical Therapy

## 2021-08-07 DIAGNOSIS — M79662 Pain in left lower leg: Secondary | ICD-10-CM

## 2021-08-07 DIAGNOSIS — Z9181 History of falling: Secondary | ICD-10-CM

## 2021-08-07 DIAGNOSIS — R262 Difficulty in walking, not elsewhere classified: Secondary | ICD-10-CM | POA: Diagnosis not present

## 2021-08-07 NOTE — Therapy (Signed)
[place] MAIN [facility] SERVICES [address] [place], [place], [address] Phone: [phone]   Fax:  [phone]  Physical Therapy Treatment Physical Therapy Progress Note   Dates of reporting period  06/21/21   to   08/07/21   Patient Details  Name: [name] MRN: [id number] Date of Birth: 1946-04-07 Referring Provider (PT): Merlene Pulling PA-C   Encounter Date: 08/07/2021   PT End of Session - 08/07/21 0948     Visit Number 20    Number of Visits 32    Date for PT Re-Evaluation 09/18/21    Authorization Type Medicare    Authorization Time Period recert 92/95-74/7/34    Progress Note Due on Visit 20    PT Start Time 0852    PT Stop Time 0945    PT Time Calculation (min) 53 min    Equipment Utilized During Treatment Gait belt    Activity Tolerance Patient tolerated treatment well    Behavior During Therapy WFL for tasks assessed/performed             Past Medical History:  Diagnosis Date   Allergic rhinitis    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   Heart murmur    mild mitral regurg-echo 2010   Hyperlipidemia    Hypertension    Lupus anticoagulant positive    Obesity    Pre-diabetes    Primary localized osteoarthritis of left hip 03/30/2019   Stroke [facility]) 2010   Some right sided weakness.  Gait issues.   Wears dentures    partial upper    Past Surgical History:  Procedure Laterality Date   ABDOMINAL HYSTERECTOMY  1981   APPENDECTOMY     AXILLARY LYMPH NODE BIOPSY Left 05/16/2014   Procedure: EXCISION 2CM LEFT AXILLARY MASS;  Surgeon: [name], MD;  Location: [place];  Service: General;  Laterality: Left;  Left axillary sebaceous cyst excision   CATARACT  EXTRACTION W/PHACO Left 04/11/2020   Procedure: CATARACT EXTRACTION PHACO AND INTRAOCULAR LENS PLACEMENT ([place]) LEFT DIABETIC TORIC LENS  6.05 00:34.9;  Surgeon: [name], MD;  Location: [place];  Service: Ophthalmology;  Laterality: Left;   CATARACT EXTRACTION W/PHACO Right 12/12/2020   Procedure: CATARACT EXTRACTION PHACO AND INTRAOCULAR LENS PLACEMENT ([place]) RIGHT TORIC LENS;  Surgeon: [name], MD;  Location: [place];  Service: Ophthalmology;  Laterality: Right;  6.55 0:41.1   DILATION AND CURETTAGE OF UTERUS     SKIN CANCER EXCISION  2016   [place]   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   Procedure: TOTAL HIP ARTHROPLASTY;  Surgeon: [name], MD;  [facility];  Service: Orthopedics;  Laterality: Left;    There were no vitals filed for this visit.   Subjective Assessment - 08/07/21 0900     Subjective Pt reports her LLE pain is improving. She does feel a little stiff. She reports she still hasnt' followed up with orthopedic MD at this point as she wanted to finish with PT first. Therefore she hasn't had her MRI yet to rule out pinched nerve in her back. She does state, "I think these hip exercises are helping."    Pertinent History Pt reports she had a stroke in  2010 and was in inpatient rehab for 4 weeks. Pt reports she has been to therapy about one time a year since the onset of her stroke for various ailments including L THA in 2020. Pt reports she has bioness L300 which improved her drop foot on her R ankle. Pt has depended on her L LE since the stroke due to her weakness and impaired funciton on the right LE. Since the fall she has had throbbing pain in the left LE which is exacerbated with increased weightbearing activity. Pt reports she has had x rays and it shows nothing is broken but despite this she has had continued pain in the left. Pt reports pain is on the lateral aspect of her left LE up into her posterior thigh. Pt reports doctor  thinks this could be from nerve related pain but will need PT in order for her insurance to cover MRI of the lumbar spine. Prior to this fall she fell and broke her right wrist.  Pt reports she can perform any activities above the level of the wast but she is unable to bend over to complete tasks. Pt reports she has multiple wakers, 3 wheel, 4 wheel, wheelchair, standard rolling walker. Pt had brain MRI following fall and no reports of bleeding or issues.    Limitations Walking    How long can you sit comfortably? a couple hours due to low back pain    How long can you stand comfortably? 15 minutes    How long can you walk comfortably? Able to walk around [employer] but was very fatigued following    Currently in Pain? Yes    Pain Score 5     Pain Location Knee    Pain Orientation Left;Posterior    Pain Type Chronic pain    Pain Radiating Towards radiates down back of knee to ankle;    Pain Onset More than a month ago    Pain Frequency Constant    Aggravating Factors  prolonged standing/walking    Pain Relieving Factors PT stretches/intervention;    Effect of Pain on Daily Activities decreased activity tolerance;                [facility] PT Assessment - 08/07/21 0001       Observation/Other Assessments   Focus on Therapeutic Outcomes (FOTO)  58% (no changef rom 06/21/21 which was 58%)      Transfers   Five time sit to stand comments  20.15 sec with BUE use, >15 sec indicates high risk for falls, more impaired than 06/21/21 which was 16 sec with UE use; does exhibit decreased forward lean with limited push through LE;      6 Minute walk- Post Test   6 Minute Walk Post Test yes    Perceived Rate of Exertion ([place]) 11- Fairly light      6 minute walk test results    Aerobic Endurance Distance Walked 650    Endurance additional comments with 3 wheeled walker, supervision, uneven cadence/step length; more impaired than 06/21/21 which was 835 feet with 3 wheeled walker             Treatment provided this session   Therex:  Patient supine: PT performed passive hamstring stretch SLR 30 sec hold x1 rep each LE  Progressed to LLE passive hamstring stretch with ankle DF x10 reps for neural flossing PT performed passive hamstring stretch with hip flexed and knee flexion/extended (flossing) 5 sec hold x5 reps each PT performed passive  single knee to chest stretch 20 sec hold x1 reps each LE; LLE piriformis stretch passive 30 sec hold x2 reps;  Patient exhibits moderate stiffness in LLE particularly in left gluteal/piriformis. She was able to exhibit better ROM following soft tissue massage with repeated stretch;   Manual Therapy:   MFRE and STM to Glute med and min x 7 min utilizing cross friction for better mobility; PT also performed soft tissue massage along proximal IT band on LLE x3 min;   Attempted R SL for manual therapy but pt unable to obtain this position  Significant improvement in symptoms noted following manual therapy and today's session. Patient reports reduction in LLE pain to 2/10 at end of session;        Pt educated throughout session about proper posture and technique with exercises. Improved exercise technique, movement at target joints, use of target muscles after min to mod verbal, visual, tactile cues.    Patient's condition has the potential to improve in response to therapy. Maximum improvement is yet to be obtained. The anticipated improvement is attainable and reasonable in a generally predictable time.  Patient reports adherence with HEP daily; She reports reduction in pain in LLE following introduction of soft tissue massage to left hip;                            PT Education - 08/07/21 0948     Education Details Positioning/progress towards goals;    Person(s) Educated Patient    Methods Explanation;Verbal cues    Comprehension Verbalized understanding;Returned demonstration;Verbal cues required;Need further  instruction              PT Short Term Goals - 08/07/21 0903       PT SHORT TERM GOAL #1   Title Patient will be independent in home exercise program to improve strength/mobility for better functional independence with ADLs.    Baseline Met    Time 3    Period Days    Status Achieved      PT SHORT TERM GOAL #2   Title Pt will be able to complete 6 MWT without requiring rest break or sitting in order to indicate ipmroved aerobic and muscular endurance for everyday tasks.    Baseline 835 feet with 3 wheel walker at PN 9/8    Time 4    Period Weeks    Status Achieved      PT SHORT TERM GOAL #3   Title Patient will complete five times sit to stand test in < 25 seconds indicating an increased LE strength and improved balance.    Baseline 16 sec 9/8 with UE use    Time 4    Period Weeks    Status Achieved    Target Date 06/18/21               PT Long Term Goals - 08/07/21 0903       PT LONG TERM GOAL #1   Title Pt will improve FOTO score to 60 in order to indicate improvement with everyday tasks    Baseline FOTO: 58 om 9/8 (met initial foto goal), 1025: 58%    Time 6    Period Weeks    Status On-going    Target Date 09/18/21      PT LONG TERM GOAL #2   Title Patient will complete five times sit to stand test in < 13 seconds indicating an increased LE strength and  improved balance.    Baseline 5XSTS in 16 sec with UE use 06/21/21, 10/25: 18 sec with UE use    Time 6    Period Weeks    Status Not Met    Target Date 09/18/21      PT LONG TERM GOAL #3   Title Patient will increase six minute walk test distance to >900 feet for progression to improve gait ability and safety with ambulation    Baseline 835 at progress note, 10/25: 650 feet with walker    Time 8    Period Weeks    Status Not Met    Target Date 09/18/21      PT LONG TERM GOAL #4   Title Pt will safely demonstrate ability to navigate up and down 4 stairs without assistance in order to improve  ability to navigate stairs in her daily life    Baseline able to ascend 1 stair but too reports fear of descending more than 1 stair    Time 6    Period Weeks    Status Not Met    Target Date 09/18/21                   Plan - 08/07/21 0949     Clinical Impression Statement Patient motivated and participated well within session. She reports overall feeling a reduction in LLE pain since starting soft tissue massage and stretches to left hip. Patient continues to exhibit stiffness in left hip with tightness along soft tissue. She also exhibits weakness in BLE which limits transfers and gait ability. Patient reports feeling increased stiffness this session. She does exhibit a worsening of sit<>Stand and gait ability today. This likely could be related to increased stiffness. However despite decreased mobility patient overall reports a reduction in pain which is improvement. She is wanting to finish with PT prior to return to provider to further assess lumbar spine. She would benefit from skilled PT intervention to improve ROM, strength and reduce pain with ADLs. Plan to continue for another 6 weeks to address goals and improve mobility while reducing pain.    Personal Factors and Comorbidities Age;Comorbidity 1    Examination-Activity Limitations Bend;Carry;Squat;Stairs    Examination-Participation Restrictions Cleaning;Laundry    Stability/Clinical Decision Making Evolving/Moderate complexity    Rehab Potential Good    PT Frequency 2x / week    PT Duration 6 weeks    PT Treatment/Interventions ADLs/Self Care Home Management;Aquatic Therapy;Electrical Stimulation;Gait training;Stair training;Functional mobility training;Therapeutic activities;Therapeutic exercise;Balance training;Neuromuscular re-education;Patient/family education;Prosthetic Training;Manual techniques;Passive range of motion;Energy conservation    PT Next Visit Plan Contine stair training (rail on left ascending)    PT Home  Exercise Plan No updates to HEP this date    Consulted and Agree with Plan of Care Patient             Patient will benefit from skilled therapeutic intervention in order to improve the following deficits and impairments:  Abnormal gait, Decreased balance, Decreased endurance, Decreased mobility, Decreased activity tolerance  Visit Diagnosis: Difficulty in walking, not elsewhere classified  History of falling  Pain in left lower leg     Problem List Patient Active Problem List   Diagnosis Date Noted   Primary localized osteoarthritis of left hip 03/30/2019   Status post left hip replacement 03/30/2019    Erendira Crabtree, PT, DPT 08/07/2021, 9:53 AM  [place] [facility] [address] [place], [place], [address] Phone: [phone]   Fax:  [phone]  Name: [name] MRN: [id number] Date of Birth: 03-23-1946

## 2021-08-09 ENCOUNTER — Ambulatory Visit: Payer: Medicare Other | Admitting: Physical Therapy

## 2021-08-09 DIAGNOSIS — R82998 Other abnormal findings in urine: Secondary | ICD-10-CM | POA: Diagnosis not present

## 2021-08-09 DIAGNOSIS — Z1331 Encounter for screening for depression: Secondary | ICD-10-CM | POA: Diagnosis not present

## 2021-08-09 DIAGNOSIS — Z0000 Encounter for general adult medical examination without abnormal findings: Secondary | ICD-10-CM | POA: Diagnosis not present

## 2021-08-09 DIAGNOSIS — I10 Essential (primary) hypertension: Secondary | ICD-10-CM | POA: Diagnosis not present

## 2021-08-09 DIAGNOSIS — Z7901 Long term (current) use of anticoagulants: Secondary | ICD-10-CM | POA: Diagnosis not present

## 2021-08-09 DIAGNOSIS — R2689 Other abnormalities of gait and mobility: Secondary | ICD-10-CM | POA: Diagnosis not present

## 2021-08-09 DIAGNOSIS — R7301 Impaired fasting glucose: Secondary | ICD-10-CM | POA: Diagnosis not present

## 2021-08-09 DIAGNOSIS — M8580 Other specified disorders of bone density and structure, unspecified site: Secondary | ICD-10-CM | POA: Diagnosis not present

## 2021-08-09 DIAGNOSIS — M810 Age-related osteoporosis without current pathological fracture: Secondary | ICD-10-CM | POA: Diagnosis not present

## 2021-08-09 DIAGNOSIS — I69959 Hemiplegia and hemiparesis following unspecified cerebrovascular disease affecting unspecified side: Secondary | ICD-10-CM | POA: Diagnosis not present

## 2021-08-09 DIAGNOSIS — Z23 Encounter for immunization: Secondary | ICD-10-CM | POA: Diagnosis not present

## 2021-08-09 DIAGNOSIS — D689 Coagulation defect, unspecified: Secondary | ICD-10-CM | POA: Diagnosis not present

## 2021-08-09 DIAGNOSIS — E119 Type 2 diabetes mellitus without complications: Secondary | ICD-10-CM | POA: Diagnosis not present

## 2021-08-09 DIAGNOSIS — E785 Hyperlipidemia, unspecified: Secondary | ICD-10-CM | POA: Diagnosis not present

## 2021-08-09 DIAGNOSIS — Z1339 Encounter for screening examination for other mental health and behavioral disorders: Secondary | ICD-10-CM | POA: Diagnosis not present

## 2021-08-14 ENCOUNTER — Ambulatory Visit: Payer: Medicare Other | Attending: Surgical

## 2021-08-14 ENCOUNTER — Other Ambulatory Visit: Payer: Self-pay

## 2021-08-14 DIAGNOSIS — R262 Difficulty in walking, not elsewhere classified: Secondary | ICD-10-CM | POA: Insufficient documentation

## 2021-08-14 DIAGNOSIS — R2689 Other abnormalities of gait and mobility: Secondary | ICD-10-CM | POA: Insufficient documentation

## 2021-08-14 DIAGNOSIS — M79662 Pain in left lower leg: Secondary | ICD-10-CM | POA: Insufficient documentation

## 2021-08-14 DIAGNOSIS — R269 Unspecified abnormalities of gait and mobility: Secondary | ICD-10-CM | POA: Diagnosis not present

## 2021-08-14 DIAGNOSIS — R278 Other lack of coordination: Secondary | ICD-10-CM | POA: Diagnosis not present

## 2021-08-14 DIAGNOSIS — R2681 Unsteadiness on feet: Secondary | ICD-10-CM | POA: Diagnosis not present

## 2021-08-14 DIAGNOSIS — M6281 Muscle weakness (generalized): Secondary | ICD-10-CM | POA: Insufficient documentation

## 2021-08-14 NOTE — Therapy (Signed)
[place] MAIN [facility] SERVICES [address] [place], [place], [address] Phone: [phone]   Fax:  [phone]  Physical Therapy Treatment  Patient Details  Name: [name] MRN: [id number] Date of Birth: [birth year] Referring Provider (PT): Merlene Pulling PA-C   Encounter Date: 08/14/2021   PT End of Session - 08/14/21 1111     Visit Number 21    Number of Visits 32    Date for PT Re-Evaluation 09/18/21    Authorization Type Medicare    Authorization Time Period recert 15/17-61/6/07    Progress Note Due on Visit 20    PT Start Time 0919    PT Stop Time 0959    PT Time Calculation (min) 40 min    Equipment Utilized During Treatment Gait belt    Activity Tolerance Patient tolerated treatment well;Patient limited by pain    Behavior During Therapy WFL for tasks assessed/performed             Past Medical History:  Diagnosis Date   Allergic rhinitis    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   Heart murmur    mild mitral regurg-echo 2010   Hyperlipidemia    Hypertension    Lupus anticoagulant positive    Obesity    Pre-diabetes    Primary localized osteoarthritis of left hip 03/30/2019   Stroke [facility]) 2010   Some right sided weakness.  Gait issues.   Wears dentures    partial upper    Past Surgical History:  Procedure Laterality Date   ABDOMINAL HYSTERECTOMY  1981   APPENDECTOMY     AXILLARY LYMPH NODE BIOPSY Left 05/16/2014   Procedure: EXCISION 2CM LEFT AXILLARY MASS;  Surgeon: [name], MD;  Location: [place];  Service: General;  Laterality: Left;  Left axillary sebaceous cyst excision   CATARACT EXTRACTION W/PHACO Left 04/11/2020   Procedure: CATARACT EXTRACTION  PHACO AND INTRAOCULAR LENS PLACEMENT ([place]) LEFT DIABETIC TORIC LENS  6.05 00:34.9;  Surgeon: [name], MD;  Location: [place];  Service: Ophthalmology;  Laterality: Left;   CATARACT EXTRACTION W/PHACO Right 12/12/2020   Procedure: CATARACT EXTRACTION PHACO AND INTRAOCULAR LENS PLACEMENT ([place]) RIGHT TORIC LENS;  Surgeon: [name], MD;  Location: [place];  Service: Ophthalmology;  Laterality: Right;  6.55 0:41.1   DILATION AND CURETTAGE OF UTERUS     SKIN CANCER EXCISION  2016   [facility]   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   Procedure: TOTAL HIP ARTHROPLASTY;  Surgeon: [name], MD;  [facility];  Service: Orthopedics;  Laterality: Left;    There were no vitals filed for this visit.   Subjective Assessment - 08/14/21 0923     Subjective Pt reports LLE pain this morning started out as an 8/10. She reports she performed some of her HEP and then rested which brought her pain down to a 3/10.    Pertinent History Pt reports she had a stroke in 2010 and was in inpatient rehab for 4 weeks. Pt reports she has been to therapy about one time a year since the onset of her stroke for various ailments including L THA in 2020. Pt reports she has bioness L300 which improved her drop  foot on her R ankle. Pt has depended on her L LE since the stroke due to her weakness and impaired funciton on the right LE. Since the fall she has had throbbing pain in the left LE which is exacerbated with increased weightbearing activity. Pt reports she has had x rays and it shows nothing is broken but despite this she has had continued pain in the left. Pt reports pain is on the lateral aspect of her left LE up into her posterior thigh. Pt reports doctor thinks this could be from nerve related pain but will need PT in order for her insurance to cover MRI of the lumbar spine. Prior to this fall she fell and broke her right wrist.  Pt reports she can perform any activities above the  level of the wast but she is unable to bend over to complete tasks. Pt reports she has multiple wakers, 3 wheel, 4 wheel, wheelchair, standard rolling walker. Pt had brain MRI following fall and no reports of bleeding or issues.    Limitations Walking    How long can you sit comfortably? a couple hours due to low back pain    How long can you stand comfortably? 15 minutes    How long can you walk comfortably? Able to walk around [employer] but was very fatigued following    Currently in Pain? Yes    Pain Score 3     Pain Location Leg    Pain Orientation Left    Pain Descriptors / Indicators Throbbing    Pain Onset More than a month ago              INTERVENTIONS  Therex:   Pt supine on plinth: Assisted hamstring stretch 3x30 sec each LE  Assisted LLE hamstring stretch 2x30 sec, progressed to with PF/DF to promote nerve floss for multiple bouts of 10 reps on LLE  Passive hamstring stretch of LLE with hip flexed and knee flexion/extended (flossing) 2x10   Single knee to chest stretch 30 sec hold x3 reps LLE, 30 sec RLE;  Glute squeezes 2x10  LLE SLR 9x, 10x,11x; pt reports progressive increase in challenge with reps   Manually resisted hip abduction 3x10 BLEs  Supine hip flexion marches, attempts with TA activation and ppt, however, had to discontinue after 5 reps due to increased LLE pain  Glute squeezes 2x10 for pain modulation  At end of session pt reports she remains at baseline pain of 3/10.   Pt educated throughout session about proper posture and technique with exercises. Improved exercise technique, movement at target joints, use of target muscles after min to mod verbal, visual, tactile cues.   PT Education - 08/14/21 1111     Education Details exercise technique, body mechanics    Person(s) Educated Patient    Methods Explanation;Demonstration;Tactile cues;Verbal cues    Comprehension Verbalized understanding;Returned demonstration;Need further  instruction;Tactile cues required              PT Short Term Goals - 08/07/21 0903       PT SHORT TERM GOAL #1   Title Patient will be independent in home exercise program to improve strength/mobility for better functional independence with ADLs.    Baseline Met    Time 3    Period Days    Status Achieved      PT SHORT TERM GOAL #2   Title Pt will be able to complete 6 MWT without requiring rest break or sitting in order to indicate ipmroved  aerobic and muscular endurance for everyday tasks.    Baseline 835 feet with 3 wheel walker at PN 9/8    Time 4    Period Weeks    Status Achieved      PT SHORT TERM GOAL #3   Title Patient will complete five times sit to stand test in < 25 seconds indicating an increased LE strength and improved balance.    Baseline 16 sec 9/8 with UE use    Time 4    Period Weeks    Status Achieved    Target Date 06/18/21               PT Long Term Goals - 08/07/21 0903       PT LONG TERM GOAL #1   Title Pt will improve FOTO score to 60 in order to indicate improvement with everyday tasks    Baseline FOTO: 58 om 9/8 (met initial foto goal), 1025: 58%    Time 6    Period Weeks    Status On-going    Target Date 09/18/21      PT LONG TERM GOAL #2   Title Patient will complete five times sit to stand test in < 13 seconds indicating an increased LE strength and improved balance.    Baseline 5XSTS in 16 sec with UE use 06/21/21, 10/25: 18 sec with UE use    Time 6    Period Weeks    Status Not Met    Target Date 09/18/21      PT LONG TERM GOAL #3   Title Patient will increase six minute walk test distance to >900 feet for progression to improve gait ability and safety with ambulation    Baseline 835 at progress note, 10/25: 650 feet with walker    Time 8    Period Weeks    Status Not Met    Target Date 09/18/21      PT LONG TERM GOAL #4   Title Pt will safely demonstrate ability to navigate up and down 4 stairs without assistance in  order to improve ability to navigate stairs in her daily life    Baseline able to ascend 1 stair but too reports fear of descending more than 1 stair    Time 6    Period Weeks    Status Not Met    Target Date 09/18/21                   Plan - 08/14/21 1112     Clinical Impression Statement Pt exhibits excellent motivation throughout session despite being limited d/t LLE pain. Following LE stretches pt was able to perform some gentle therex, tolerating the majority of it well without an increase from baseline pain of 3/10. However, pt was unable to perform supine hip flexion marches with PPT and TA activation due to LLE pain increase. Manual therapy was unable to performed this session due to time, but pt did request it as she reports it has helped her with pain reduction in prior sessions. The pt will benefit from further skilled PT to continue to improve ROM, strength and to decrease pain with ADLs.    Personal Factors and Comorbidities Age;Comorbidity 1    Examination-Activity Limitations Bend;Carry;Squat;Stairs    Examination-Participation Restrictions Cleaning;Laundry    Stability/Clinical Decision Making Evolving/Moderate complexity    Rehab Potential Good    PT Frequency 2x / week    PT Duration 6 weeks    PT Treatment/Interventions ADLs/Self  Care Home Management;Aquatic Therapy;Electrical Stimulation;Gait training;Stair training;Functional mobility training;Therapeutic activities;Therapeutic exercise;Balance training;Neuromuscular re-education;Patient/family education;Prosthetic Training;Manual techniques;Passive range of motion;Energy conservation    PT Next Visit Plan Contine stair training (rail on left ascending)    PT Home Exercise Plan No updates to HEP this date    Consulted and Agree with Plan of Care Patient             Patient will benefit from skilled therapeutic intervention in order to improve the following deficits and impairments:  Abnormal gait, Decreased  balance, Decreased endurance, Decreased mobility, Decreased activity tolerance  Visit Diagnosis: Pain in left lower leg  Muscle weakness (generalized)  Other abnormalities of gait and mobility     Problem List Patient Active Problem List   Diagnosis Date Noted   Primary localized osteoarthritis of left hip 03/30/2019   Status post left hip replacement 03/30/2019    Zollie Pee, PT 08/14/2021, 11:20 AM  [place] [address] [place], [place], [address] Phone: [phone]   Fax:  [phone]  Name: [name] MRN: [id number] Date of Birth: 09/04/1946

## 2021-08-16 ENCOUNTER — Ambulatory Visit: Payer: Medicare Other | Admitting: Physical Therapy

## 2021-08-21 ENCOUNTER — Encounter: Payer: Self-pay | Admitting: Physical Therapy

## 2021-08-21 ENCOUNTER — Ambulatory Visit: Payer: Medicare Other | Admitting: Physical Therapy

## 2021-08-21 ENCOUNTER — Other Ambulatory Visit: Payer: Self-pay

## 2021-08-21 DIAGNOSIS — M6281 Muscle weakness (generalized): Secondary | ICD-10-CM

## 2021-08-21 DIAGNOSIS — M79662 Pain in left lower leg: Secondary | ICD-10-CM | POA: Diagnosis not present

## 2021-08-21 DIAGNOSIS — R2689 Other abnormalities of gait and mobility: Secondary | ICD-10-CM | POA: Diagnosis not present

## 2021-08-21 DIAGNOSIS — R2681 Unsteadiness on feet: Secondary | ICD-10-CM | POA: Diagnosis not present

## 2021-08-21 DIAGNOSIS — R262 Difficulty in walking, not elsewhere classified: Secondary | ICD-10-CM

## 2021-08-21 DIAGNOSIS — R269 Unspecified abnormalities of gait and mobility: Secondary | ICD-10-CM | POA: Diagnosis not present

## 2021-08-21 NOTE — Therapy (Signed)
[place] MAIN [facility] SERVICES [address] [place], [place], [address] Phone: [phone]   Fax:  [phone]  Physical Therapy Treatment  Patient Details  Name: [name] MRN: [id number] Date of Birth: 11/13/1945 Referring Provider (PT): Merlene Pulling PA-C   Encounter Date: 08/21/2021   PT End of Session - 08/21/21 1249     Visit Number 22    Number of Visits 32    Date for PT Re-Evaluation 09/18/21    Authorization Type Medicare    Authorization Time Period recert 77/41-42/3/95    Progress Note Due on Visit 20    PT Start Time 0850    PT Stop Time 0930    PT Time Calculation (min) 40 min    Equipment Utilized During Treatment Gait belt    Activity Tolerance Patient tolerated treatment well;Patient limited by pain    Behavior During Therapy WFL for tasks assessed/performed             Past Medical History:  Diagnosis Date   Allergic rhinitis    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   Heart murmur    mild mitral regurg-echo 2010   Hyperlipidemia    Hypertension    Lupus anticoagulant positive    Obesity    Pre-diabetes    Primary localized osteoarthritis of left hip 03/30/2019   Stroke [facility]) 2010   Some right sided weakness.  Gait issues.   Wears dentures    partial upper    Past Surgical History:  Procedure Laterality Date   ABDOMINAL HYSTERECTOMY  1981   APPENDECTOMY     AXILLARY LYMPH NODE BIOPSY Left 05/16/2014   Procedure: EXCISION 2CM LEFT AXILLARY MASS;  Surgeon: [name], MD;  Location: [place];  Service: General;  Laterality: Left;  Left axillary sebaceous cyst excision   CATARACT EXTRACTION W/PHACO Left 04/11/2020   Procedure: CATARACT EXTRACTION  PHACO AND INTRAOCULAR LENS PLACEMENT ([place]) LEFT DIABETIC TORIC LENS  6.05 00:34.9;  Surgeon: [name], MD;  Location: [place];  Service: Ophthalmology;  Laterality: Left;   CATARACT EXTRACTION W/PHACO Right 12/12/2020   Procedure: CATARACT EXTRACTION PHACO AND INTRAOCULAR LENS PLACEMENT ([place]) RIGHT TORIC LENS;  Surgeon: [name], MD;  Location: [place];  Service: Ophthalmology;  Laterality: Right;  6.55 0:41.1   DILATION AND CURETTAGE OF UTERUS     SKIN CANCER EXCISION  2016   [place]   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   Procedure: TOTAL HIP ARTHROPLASTY;  Surgeon: [name], MD;  [facility];  Service: Orthopedics;  Laterality: Left;    There were no vitals filed for this visit.   Subjective Assessment - 08/21/21 0853     Subjective Pt reports LLE pain this morning started out as an 5-6/10. She reports that after having some massage on her hip that it does alleviate pain in ankle.    Pertinent History Pt reports she had a stroke in 2010 and was in inpatient rehab for 4 weeks. Pt reports she has been to therapy about one time a year since the onset of her stroke for various ailments including L THA in 2020. Pt reports she has bioness L300 which improved her drop foot on  her R ankle. Pt has depended on her L LE since the stroke due to her weakness and impaired funciton on the right LE. Since the fall she has had throbbing pain in the left LE which is exacerbated with increased weightbearing activity. Pt reports she has had x rays and it shows nothing is broken but despite this she has had continued pain in the left. Pt reports pain is on the lateral aspect of her left LE up into her posterior thigh. Pt reports doctor thinks this could be from nerve related pain but will need PT in order for her insurance to cover MRI of the lumbar spine. Prior to this fall she fell and broke her right wrist.  Pt reports she can perform any activities above the level  of the wast but she is unable to bend over to complete tasks. Pt reports she has multiple wakers, 3 wheel, 4 wheel, wheelchair, standard rolling walker. Pt had brain MRI following fall and no reports of bleeding or issues.    Limitations Walking    How long can you sit comfortably? a couple hours due to low back pain    How long can you stand comfortably? 15 minutes    How long can you walk comfortably? Able to walk around [employer] but was very fatigued following    Currently in Pain? Yes    Pain Score 6     Pain Location Leg    Pain Orientation Left    Pain Descriptors / Indicators Throbbing    Pain Type Chronic pain    Pain Onset More than a month ago    Pain Frequency Constant    Aggravating Factors  prolonged standing/walking    Pain Relieving Factors PT stretches/intervention    Effect of Pain on Daily Activities decreased activity tolerance;    Multiple Pain Sites No                 Treatment provided this session Required min A to transition to supine position:   Ex:  Patient supine:  PT performed passive single knee to chest stretch 20 sec hold x1 reps each LE PT performed passive SLR hamstring stretch 30 sec hold x2 reps each LE,             Progressed to neural flossing with ankle DF/PF slow ROM x10 reps each set;   Piriformis muscular flexibility assessed and pt had mod limitations in muscular flexibility on the left 2* 30 sec piriformis stretch on L (regular and modified, with minimal ROM noted in regular position)  with similar cues to relax and allow therapist to assist with proper stretch   Required mod A for rolling to right sidelying with pillows under UE/LE;  Manual Therapy:  Manual therapy: performed with pt in right sidelying position   Soft tissue massage to left piriformis/posterior hip x15  min, utilized rolling stick and therapist hands with cross friction and myofascial release technique; Ischemic trigger point release to left piriformis 30 sec  hold x2 reps  After manual therapy, assisted patient in rolling supine, min A  Passive piriformis stretch LLE 30 sec hold x2 reps, partial ROM, patient able to tolerate increased ROM and reports less discomfort;       Required mod A to transition to sitting position;  Educated patient how to use hook in sitting to provide ischemic trigger point release to left piriformis.    Patient exhibits moderate difficulty with rolling and bed mobility. She often leans back  without rolling which likely strains low back. Patient would benefit from additional instruction in bed mobility to help reduce back strain.    Pt educated throughout session about proper posture and technique with exercises. Improved exercise technique, movement at target joints, use of target muscles after min to mod verbal, visual, tactile cues.   She tolerated session well reporting no LLE lower leg pain following manual therapy. She states, "I feel a lot better, like I had a good workout."                          PT Education - 08/21/21 1247     Education Details Positioning;    Person(s) Educated Patient    Methods Explanation;Verbal cues    Comprehension Verbalized understanding;Returned demonstration;Verbal cues required;Need further instruction              PT Short Term Goals - 08/07/21 0903       PT SHORT TERM GOAL #1   Title Patient will be independent in home exercise program to improve strength/mobility for better functional independence with ADLs.    Baseline Met    Time 3    Period Days    Status Achieved      PT SHORT TERM GOAL #2   Title Pt will be able to complete 6 MWT without requiring rest break or sitting in order to indicate ipmroved aerobic and muscular endurance for everyday tasks.    Baseline 835 feet with 3 wheel walker at PN 9/8    Time 4    Period Weeks    Status Achieved      PT SHORT TERM GOAL #3   Title Patient will complete five times sit to stand test in <  25 seconds indicating an increased LE strength and improved balance.    Baseline 16 sec 9/8 with UE use    Time 4    Period Weeks    Status Achieved    Target Date 06/18/21               PT Long Term Goals - 08/07/21 0903       PT LONG TERM GOAL #1   Title Pt will improve FOTO score to 60 in order to indicate improvement with everyday tasks    Baseline FOTO: 58 om 9/8 (met initial foto goal), 1025: 58%    Time 6    Period Weeks    Status On-going    Target Date 09/18/21      PT LONG TERM GOAL #2   Title Patient will complete five times sit to stand test in < 13 seconds indicating an increased LE strength and improved balance.    Baseline 5XSTS in 16 sec with UE use 06/21/21, 10/25: 18 sec with UE use    Time 6    Period Weeks    Status Not Met    Target Date 09/18/21      PT LONG TERM GOAL #3   Title Patient will increase six minute walk test distance to >900 feet for progression to improve gait ability and safety with ambulation    Baseline 835 at progress note, 10/25: 650 feet with walker    Time 8    Period Weeks    Status Not Met    Target Date 09/18/21      PT LONG TERM GOAL #4   Title Pt will safely demonstrate ability to navigate up and down 4 stairs without  assistance in order to improve ability to navigate stairs in her daily life    Baseline able to ascend 1 stair but too reports fear of descending more than 1 stair    Time 6    Period Weeks    Status Not Met    Target Date 09/18/21                   Plan - 08/21/21 1250     Clinical Impression Statement Patient motivated and participated well within session. PT performed passive stretches and extensive manual therapy to help reduce tightness. She continues to have trigger points in piriformis which radiates pain down LLE. She reports resolution of LLE pain at end of session. Educated patient in proper positioning and using trigger point hook for use at home to reduce tightness. She does exhibit  increased difficulty with bed mobility including supine<>sitting and rolling requiring mod A. Would benefit from additional skilled intervention to address bed moblity to reduce back strain. Patient would also benefit from additional skilled intervention to improve strength, ROM and mobility while reducing pain;    Personal Factors and Comorbidities Age;Comorbidity 1    Examination-Activity Limitations Bend;Carry;Squat;Stairs    Examination-Participation Restrictions Cleaning;Laundry    Stability/Clinical Decision Making Evolving/Moderate complexity    Rehab Potential Good    PT Frequency 2x / week    PT Duration 6 weeks    PT Treatment/Interventions ADLs/Self Care Home Management;Aquatic Therapy;Electrical Stimulation;Gait training;Stair training;Functional mobility training;Therapeutic activities;Therapeutic exercise;Balance training;Neuromuscular re-education;Patient/family education;Prosthetic Training;Manual techniques;Passive range of motion;Energy conservation    PT Next Visit Plan Contine stair training (rail on left ascending)    PT Home Exercise Plan No updates to HEP this date    Consulted and Agree with Plan of Care Patient             Patient will benefit from skilled therapeutic intervention in order to improve the following deficits and impairments:  Abnormal gait, Decreased balance, Decreased endurance, Decreased mobility, Decreased activity tolerance  Visit Diagnosis: Pain in left lower leg  Muscle weakness (generalized)  Other abnormalities of gait and mobility  Difficulty in walking, not elsewhere classified     Problem List Patient Active Problem List   Diagnosis Date Noted   Primary localized osteoarthritis of left hip 03/30/2019   Status post left hip replacement 03/30/2019    Darius Fillingim, PT, DPT 08/21/2021, 12:58 PM  [facility] [address] [place], [place], [address] Phone: [phone]    Fax:  [phone]  Name: [name] MRN: [id number] Date of Birth: [birth year]

## 2021-08-23 ENCOUNTER — Other Ambulatory Visit: Payer: Self-pay

## 2021-08-23 ENCOUNTER — Encounter: Payer: Self-pay | Admitting: Physical Therapy

## 2021-08-23 ENCOUNTER — Ambulatory Visit: Payer: Medicare Other | Admitting: Physical Therapy

## 2021-08-23 DIAGNOSIS — R278 Other lack of coordination: Secondary | ICD-10-CM

## 2021-08-23 DIAGNOSIS — R262 Difficulty in walking, not elsewhere classified: Secondary | ICD-10-CM | POA: Diagnosis not present

## 2021-08-23 DIAGNOSIS — R2689 Other abnormalities of gait and mobility: Secondary | ICD-10-CM

## 2021-08-23 DIAGNOSIS — R2681 Unsteadiness on feet: Secondary | ICD-10-CM

## 2021-08-23 DIAGNOSIS — M79662 Pain in left lower leg: Secondary | ICD-10-CM | POA: Diagnosis not present

## 2021-08-23 DIAGNOSIS — M6281 Muscle weakness (generalized): Secondary | ICD-10-CM

## 2021-08-23 DIAGNOSIS — R269 Unspecified abnormalities of gait and mobility: Secondary | ICD-10-CM | POA: Diagnosis not present

## 2021-08-23 NOTE — Therapy (Signed)
[place] MAIN [facility] SERVICES [address] [place], [place], [address] Phone: [phone]   Fax:  [phone]  Physical Therapy Treatment  Patient Details  Name: [name] MRN: [id number] Date of Birth: [birth year] Referring Provider (PT): Merlene Pulling PA-C   Encounter Date: 08/23/2021   PT End of Session - 08/23/21 1051     Visit Number 23    Number of Visits 32    Date for PT Re-Evaluation 09/18/21    Authorization Type Medicare    Authorization Time Period recert 50/35-46/5/68    Progress Note Due on Visit 36    PT Start Time 0937    PT Stop Time 1015    PT Time Calculation (min) 38 min    Activity Tolerance Patient tolerated treatment well    Behavior During Therapy [facility] for tasks assessed/performed             Past Medical History:  Diagnosis Date   Allergic rhinitis    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   Heart murmur    mild mitral regurg-echo 2010   Hyperlipidemia    Hypertension    Lupus anticoagulant positive    Obesity    Pre-diabetes    Primary localized osteoarthritis of left hip 03/30/2019   Stroke [facility]) 2010   Some right sided weakness.  Gait issues.   Wears dentures    partial upper    Past Surgical History:  Procedure Laterality Date   ABDOMINAL HYSTERECTOMY  1981   APPENDECTOMY     AXILLARY LYMPH NODE BIOPSY Left 05/16/2014   Procedure: EXCISION 2CM LEFT AXILLARY MASS;  Surgeon: [name], MD;  Location: [place];  Service: General;  Laterality: Left;  Left axillary sebaceous cyst excision   CATARACT EXTRACTION W/PHACO Left 04/11/2020   Procedure: CATARACT EXTRACTION PHACO AND INTRAOCULAR LENS PLACEMENT ([place]) LEFT DIABETIC TORIC LENS  6.05  00:34.9;  Surgeon: [name], MD;  Location: [place];  Service: Ophthalmology;  Laterality: Left;   CATARACT EXTRACTION W/PHACO Right 12/12/2020   Procedure: CATARACT EXTRACTION PHACO AND INTRAOCULAR LENS PLACEMENT ([place]) RIGHT TORIC LENS;  Surgeon: [name], MD;  Location: [place];  Service: Ophthalmology;  Laterality: Right;  6.55 0:41.1   DILATION AND CURETTAGE OF UTERUS     SKIN CANCER EXCISION  2016   [facility]   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   Procedure: TOTAL HIP ARTHROPLASTY;  Surgeon: [name], MD;  [facility];  Service: Orthopedics;  Laterality: Left;    There were no vitals filed for this visit.   Subjective Assessment - 08/23/21 0940     Subjective Pt states she had no pain for 2 days following last PT session; pain returned this morning however at a lower level of 3-4/10.    Pertinent History Pt reports she had a stroke in 2010 and was in inpatient rehab for 4 weeks. Pt reports she has been to therapy about one time a year since the onset of her stroke for various ailments including L THA in 2020. Pt reports she has bioness L300 which improved her drop foot on her R ankle. Pt has depended on her L LE since the stroke due to her  weakness and impaired funciton on the right LE. Since the fall she has had throbbing pain in the left LE which is exacerbated with increased weightbearing activity. Pt reports she has had x rays and it shows nothing is broken but despite this she has had continued pain in the left. Pt reports pain is on the lateral aspect of her left LE up into her posterior thigh. Pt reports doctor thinks this could be from nerve related pain but will need PT in order for her insurance to cover MRI of the lumbar spine. Prior to this fall she fell and broke her right wrist.  Pt reports she can perform any activities above the level of the wast but she is unable to bend over to complete tasks. Pt reports she has multiple wakers,  3 wheel, 4 wheel, wheelchair, standard rolling walker. Pt had brain MRI following fall and no reports of bleeding or issues.    Limitations Walking    How long can you sit comfortably? a couple hours due to low back pain    How long can you stand comfortably? 15 minutes    How long can you walk comfortably? Able to walk around [place] but was very fatigued following    Currently in Pain? Yes    Pain Score 4     Pain Location Leg    Pain Orientation Left    Pain Descriptors / Indicators Throbbing    Pain Type Chronic pain    Pain Onset More than a month ago    Pain Frequency Constant               INTERVENTION   CGA to transition to supine position:    Manual Therapy: Patient supine; frequent verbal cues to relax and allow therapist to assist with proper stretch   Single knee to chest stretch 30 sec hold x1 reps each LE SLR hamstring stretch 30 sec hold x2 reps each LE,  Progressed to neural flossing with ankle DF/PF slow ROM x10 reps each set; active flossing on LLE, passive on RLE due to foot drop. Piriformis stretch 30 sec hold x 2 reps on each LE     Required mod A for rolling to right sidelying with pillows under UE/LE;   Following manual therapy performed with pt in right sidelying position.    Soft tissue massage to left piriformis/posterior hip x10  min, utilized rolling stick and therapist hands with cross friction and myofascial release technique; Ischemic trigger point release to left piriformis 30 sec hold x2 reps.   After manual therapy, assisted patient in rolling supine, min A.   Piriformis stretch LLE 30 sec hold x2 reps.    CGA to transition to sitting position;       Pt educated throughout session about proper posture and technique with exercises. Improved exercise technique, movement at target joints, use of target muscles after min to mod verbal, visual, tactile cues.       Clinical Impression: Pt arrives late to physical therapy session.  After reporting pain had completely resolved for 2 days following previous session, PT continued POC. PT planned to address bed mobility however ran out of time within session. Compared to previous note, pt did perform supine<>sit with decreased assist however technique not ideal for a person with LBP as it could be placing additional strain on low back. Pt exhibits extreme fear of falling during rolling on large plinth table. Due to alleviation of pain after manual therapy, pt would benefit  from therex following manual techniques. Pt would benefit from additional skilled intervention to address bed moblity to reduce back strain as well as additional skilled intervention to improve strength, ROM and mobility while reducing pain.          PT Short Term Goals - 08/07/21 0903       PT SHORT TERM GOAL #1   Title Patient will be independent in home exercise program to improve strength/mobility for better functional independence with ADLs.    Baseline Met    Time 3    Period Days    Status Achieved      PT SHORT TERM GOAL #2   Title Pt will be able to complete 6 MWT without requiring rest break or sitting in order to indicate ipmroved aerobic and muscular endurance for everyday tasks.    Baseline 835 feet with 3 wheel walker at PN 9/8    Time 4    Period Weeks    Status Achieved      PT SHORT TERM GOAL #3   Title Patient will complete five times sit to stand test in < 25 seconds indicating an increased LE strength and improved balance.    Baseline 16 sec 9/8 with UE use    Time 4    Period Weeks    Status Achieved    Target Date 06/18/21               PT Long Term Goals - 08/07/21 0903       PT LONG TERM GOAL #1   Title Pt will improve FOTO score to 60 in order to indicate improvement with everyday tasks    Baseline FOTO: 58 om 9/8 (met initial foto goal), 1025: 58%    Time 6    Period Weeks    Status On-going    Target Date 09/18/21      PT LONG TERM GOAL #2   Title  Patient will complete five times sit to stand test in < 13 seconds indicating an increased LE strength and improved balance.    Baseline 5XSTS in 16 sec with UE use 06/21/21, 10/25: 18 sec with UE use    Time 6    Period Weeks    Status Not Met    Target Date 09/18/21      PT LONG TERM GOAL #3   Title Patient will increase six minute walk test distance to >900 feet for progression to improve gait ability and safety with ambulation    Baseline 835 at progress note, 10/25: 650 feet with walker    Time 8    Period Weeks    Status Not Met    Target Date 09/18/21      PT LONG TERM GOAL #4   Title Pt will safely demonstrate ability to navigate up and down 4 stairs without assistance in order to improve ability to navigate stairs in her daily life    Baseline able to ascend 1 stair but too reports fear of descending more than 1 stair    Time 6    Period Weeks    Status Not Met    Target Date 09/18/21                   Plan - 08/23/21 1050     Clinical Impression Statement Pt arrives late to physical therapy session. After reporting pain had completely resolved for 2 days following previous session, PT continued POC. PT planned to address bed mobility  however ran out of time within session. Compared to previous note, pt did perform supine<>sit with decreased assist however technique not ideal for a person with LBP as it could be placing additional strain on low back. Pt exhibits extreme fear of falling during rolling on large plinth table. Due to alleviation of pain after manual therapy, pt would benefit from therex following manual techniques. Pt would benefit from additional skilled intervention to address bed moblity to reduce back strain as well as additional skilled intervention to improve strength, ROM and mobility while reducing pain.    Personal Factors and Comorbidities Age;Comorbidity 1    Examination-Activity Limitations Bend;Carry;Squat;Stairs    Examination-Participation  Restrictions Cleaning;Laundry    Stability/Clinical Decision Making Evolving/Moderate complexity    Rehab Potential Good    PT Frequency 2x / week    PT Duration 6 weeks    PT Treatment/Interventions ADLs/Self Care Home Management;Aquatic Therapy;Electrical Stimulation;Gait training;Stair training;Functional mobility training;Therapeutic activities;Therapeutic exercise;Balance training;Neuromuscular re-education;Patient/family education;Prosthetic Training;Manual techniques;Passive range of motion;Energy conservation    PT Next Visit Plan Contine stair training (rail on left ascending)    PT Home Exercise Plan No updates to HEP this date    Consulted and Agree with Plan of Care Patient             Patient will benefit from skilled therapeutic intervention in order to improve the following deficits and impairments:  Abnormal gait, Decreased balance, Decreased endurance, Decreased mobility, Decreased activity tolerance  Visit Diagnosis: Abnormality of gait and mobility  Other lack of coordination  Difficulty in walking, not elsewhere classified  Unsteadiness on feet  Muscle weakness (generalized)  Other abnormalities of gait and mobility     Problem List Patient Active Problem List   Diagnosis Date Noted   Primary localized osteoarthritis of left hip 03/30/2019   Status post left hip replacement 03/30/2019    Patrina Levering PT, DPT  [place] [facility] MAIN [facility] SERVICES [address] [place], [place], [address] Phone: [phone]   Fax:  [phone]  Name: [name] MRN: [id number] Date of Birth: [birth year]

## 2021-08-28 ENCOUNTER — Ambulatory Visit: Payer: Medicare Other | Admitting: Physical Therapy

## 2021-08-30 ENCOUNTER — Other Ambulatory Visit: Payer: Self-pay

## 2021-08-30 ENCOUNTER — Ambulatory Visit: Payer: Medicare Other | Admitting: Physical Therapy

## 2021-08-30 DIAGNOSIS — R2689 Other abnormalities of gait and mobility: Secondary | ICD-10-CM | POA: Diagnosis not present

## 2021-08-30 DIAGNOSIS — R2681 Unsteadiness on feet: Secondary | ICD-10-CM

## 2021-08-30 DIAGNOSIS — R262 Difficulty in walking, not elsewhere classified: Secondary | ICD-10-CM | POA: Diagnosis not present

## 2021-08-30 DIAGNOSIS — M6281 Muscle weakness (generalized): Secondary | ICD-10-CM | POA: Diagnosis not present

## 2021-08-30 DIAGNOSIS — R269 Unspecified abnormalities of gait and mobility: Secondary | ICD-10-CM | POA: Diagnosis not present

## 2021-08-30 DIAGNOSIS — M79662 Pain in left lower leg: Secondary | ICD-10-CM | POA: Diagnosis not present

## 2021-08-30 NOTE — Therapy (Signed)
[place] MAIN [facility] SERVICES [address] [place], [place], [address] Phone: [phone]   Fax:  [phone]  Physical Therapy Treatment  Patient Details  Name: [name] MRN: [id number] Date of Birth: Jul 28, 1946 Referring Provider (PT): Merlene Pulling PA-C   Encounter Date: 08/30/2021   PT End of Session - 08/30/21 0937     Visit Number 24    Number of Visits 32    Date for PT Re-Evaluation 09/18/21    Authorization Type Medicare    Authorization Time Period recert 72/62-03/5/59    Progress Note Due on Visit 30    PT Start Time 0932    PT Stop Time 1013    PT Time Calculation (min) 41 min    Activity Tolerance Patient tolerated treatment well    Behavior During Therapy [facility] for tasks assessed/performed             Past Medical History:  Diagnosis Date   Allergic rhinitis    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   Heart murmur    mild mitral regurg-echo 2010   Hyperlipidemia    Hypertension    Lupus anticoagulant positive    Obesity    Pre-diabetes    Primary localized osteoarthritis of left hip 03/30/2019   Stroke [facility]) 2010   Some right sided weakness.  Gait issues.   Wears dentures    partial upper    Past Surgical History:  Procedure Laterality Date   ABDOMINAL HYSTERECTOMY  1981   APPENDECTOMY     AXILLARY LYMPH NODE BIOPSY Left 05/16/2014   Procedure: EXCISION 2CM LEFT AXILLARY MASS;  Surgeon: [name], MD;  Location: [place];  Service: General;  Laterality: Left;  Left axillary sebaceous cyst excision   CATARACT EXTRACTION W/PHACO Left 04/11/2020   Procedure: CATARACT EXTRACTION PHACO AND INTRAOCULAR LENS PLACEMENT ([place]) LEFT DIABETIC TORIC LENS  6.05  00:34.9;  Surgeon: [name], MD;  Location: [place];  Service: Ophthalmology;  Laterality: Left;   CATARACT EXTRACTION W/PHACO Right 12/12/2020   Procedure: CATARACT EXTRACTION PHACO AND INTRAOCULAR LENS PLACEMENT ([place]) RIGHT TORIC LENS;  Surgeon: [name], MD;  Location: [place];  Service: Ophthalmology;  Laterality: Right;  6.55 0:41.1   DILATION AND CURETTAGE OF UTERUS     SKIN CANCER EXCISION  2016   [place]   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   Procedure: TOTAL HIP ARTHROPLASTY;  Surgeon: [name], MD;  [facility];  Service: Orthopedics;  Laterality: Left;    There were no vitals filed for this visit.   Subjective Assessment - 08/30/21 0936     Subjective Pt states she had no pain for 2 days following last PT session; pain returned this morning however at a lower level of 3-4/10.    Pertinent History Pt reports she had a stroke in 2010 and was in inpatient rehab for 4 weeks. Pt reports she has been to therapy about one time a year since the onset of her stroke for various ailments including L THA in 2020. Pt reports she has bioness L300 which improved her drop foot on her R ankle. Pt has depended on her L LE since the stroke due to her  weakness and impaired funciton on the right LE. Since the fall she has had throbbing pain in the left LE which is exacerbated with increased weightbearing activity. Pt reports she has had x rays and it shows nothing is broken but despite this she has had continued pain in the left. Pt reports pain is on the lateral aspect of her left LE up into her posterior thigh. Pt reports doctor thinks this could be from nerve related pain but will need PT in order for her insurance to cover MRI of the lumbar spine. Prior to this fall she fell and broke her right wrist.  Pt reports she can perform any activities above the level of the wast but she is unable to bend over to complete tasks. Pt reports she has multiple wakers,  3 wheel, 4 wheel, wheelchair, standard rolling walker. Pt had brain MRI following fall and no reports of bleeding or issues.    Limitations Walking    How long can you sit comfortably? a couple hours due to low back pain    How long can you stand comfortably? 15 minutes    How long can you walk comfortably? Able to walk around [place] but was very fatigued following    Currently in Pain? Yes    Pain Score 3     Pain Location Leg    Pain Orientation Left    Pain Descriptors / Indicators Throbbing    Pain Type Chronic pain    Pain Radiating Towards radiates from back to ankle    Pain Onset More than a month ago             CGA to transition to supine position:    Manual Therapy: Patient supine; frequent verbal cues to relax and allow therapist to assist with proper stretch   Single knee to chest stretch 30 sec hold x1 reps each LE SLR hamstring stretch 30 sec hold x2 reps each LE,  Progressed to neural flossing with ankle DF/PF slow ROM x10 reps each set; active flossing on LLE, passive on RLE due to foot drop. Piriformis stretch 30 sec hold x 2 reps on each LE    Required mod A for rolling to right sidelying with pillows under UE/LE;    Following manual therapy performed with pt in right sidelying position.     Soft tissue massage to left piriformis/posterior hip x10  min, utilized rolling stick and therapist hands with cross friction and myofascial release technique; Ischemic trigger point release to left piriformis 45 sec holds x 3 reps    After manual therapy, assisted patient in rolling supine, min A.   Piriformis stretch LLE 30 sec hold x2 reps.  CGA to transition to sitting position     Pt educated throughout session about proper posture and technique with exercises. Improved exercise technique, movement at target joints, use of target muscles after min to mod verbal, visual, tactile cues.                           PT Education - 08/30/21  0937     Education Details Positioning    Person(s) Educated Patient    Methods Explanation;Verbal cues    Comprehension Verbalized understanding;Returned demonstration;Verbal cues required;Need further instruction              PT Short Term Goals - 08/07/21 0903       PT SHORT TERM GOAL #1   Title Patient will  be independent in home exercise program to improve strength/mobility for better functional independence with ADLs.    Baseline Met    Time 3    Period Days    Status Achieved      PT SHORT TERM GOAL #2   Title Pt will be able to complete 6 MWT without requiring rest break or sitting in order to indicate ipmroved aerobic and muscular endurance for everyday tasks.    Baseline 835 feet with 3 wheel walker at PN 9/8    Time 4    Period Weeks    Status Achieved      PT SHORT TERM GOAL #3   Title Patient will complete five times sit to stand test in < 25 seconds indicating an increased LE strength and improved balance.    Baseline 16 sec 9/8 with UE use    Time 4    Period Weeks    Status Achieved    Target Date 06/18/21               PT Long Term Goals - 08/07/21 0903       PT LONG TERM GOAL #1   Title Pt will improve FOTO score to 60 in order to indicate improvement with everyday tasks    Baseline FOTO: 58 om 9/8 (met initial foto goal), 1025: 58%    Time 6    Period Weeks    Status On-going    Target Date 09/18/21      PT LONG TERM GOAL #2   Title Patient will complete five times sit to stand test in < 13 seconds indicating an increased LE strength and improved balance.    Baseline 5XSTS in 16 sec with UE use 06/21/21, 10/25: 18 sec with UE use    Time 6    Period Weeks    Status Not Met    Target Date 09/18/21      PT LONG TERM GOAL #3   Title Patient will increase six minute walk test distance to >900 feet for progression to improve gait ability and safety with ambulation    Baseline 835 at progress note, 10/25: 650 feet with walker    Time 8     Period Weeks    Status Not Met    Target Date 09/18/21      PT LONG TERM GOAL #4   Title Pt will safely demonstrate ability to navigate up and down 4 stairs without assistance in order to improve ability to navigate stairs in her daily life    Baseline able to ascend 1 stair but too reports fear of descending more than 1 stair    Time 6    Period Weeks    Status Not Met    Target Date 09/18/21                   Plan - 08/30/21 5631     Clinical Impression Statement Pt reports continued improvement in LE pain and radicular type symptoms over the past week. PT continued with instruction in stretching and provided some manual cues to ensure proper proper target musculature was being stretched. Pt coninues to have significant limitations in her L piriformis muscles and continues to have significant triggerpoints and TTP in her L piriformis musculature. Pt will continue to benefit from skilled PT intervention in order to improve her LE strength, pain levels and improve her QOL.    Personal Factors and Comorbidities Age;Comorbidity 1    Examination-Activity Limitations  Bend;Carry;Squat;Stairs    Examination-Participation Restrictions Cleaning;Laundry    Stability/Clinical Decision Making Evolving/Moderate complexity    Rehab Potential Good    PT Frequency 2x / week    PT Duration 6 weeks    PT Treatment/Interventions ADLs/Self Care Home Management;Aquatic Therapy;Electrical Stimulation;Gait training;Stair training;Functional mobility training;Therapeutic activities;Therapeutic exercise;Balance training;Neuromuscular re-education;Patient/family education;Prosthetic Training;Manual techniques;Passive range of motion;Energy conservation    PT Next Visit Plan Contine stair training (rail on left ascending)    PT Home Exercise Plan No updates to HEP this date    Consulted and Agree with Plan of Care Patient             Patient will benefit from skilled therapeutic intervention in  order to improve the following deficits and impairments:  Abnormal gait, Decreased balance, Decreased endurance, Decreased mobility, Decreased activity tolerance  Visit Diagnosis: Abnormality of gait and mobility  Difficulty in walking, not elsewhere classified  Unsteadiness on feet     Problem List Patient Active Problem List   Diagnosis Date Noted   Primary localized osteoarthritis of left hip 03/30/2019   Status post left hip replacement 03/30/2019    Particia Lather, PT 08/30/2021, 1:26 PM  [place] [address] [place], [place], [address] Phone: [phone]   Fax:  [phone]  Name: [name] MRN: [id number] Date of Birth: 01-30-1946

## 2021-09-04 ENCOUNTER — Ambulatory Visit: Payer: Medicare Other | Admitting: Physical Therapy

## 2021-09-04 ENCOUNTER — Other Ambulatory Visit: Payer: Self-pay

## 2021-09-04 ENCOUNTER — Encounter: Payer: Self-pay | Admitting: Physical Therapy

## 2021-09-04 DIAGNOSIS — R262 Difficulty in walking, not elsewhere classified: Secondary | ICD-10-CM | POA: Diagnosis not present

## 2021-09-04 DIAGNOSIS — M6281 Muscle weakness (generalized): Secondary | ICD-10-CM | POA: Diagnosis not present

## 2021-09-04 DIAGNOSIS — M79662 Pain in left lower leg: Secondary | ICD-10-CM

## 2021-09-04 DIAGNOSIS — R2681 Unsteadiness on feet: Secondary | ICD-10-CM | POA: Diagnosis not present

## 2021-09-04 DIAGNOSIS — R278 Other lack of coordination: Secondary | ICD-10-CM

## 2021-09-04 DIAGNOSIS — R269 Unspecified abnormalities of gait and mobility: Secondary | ICD-10-CM | POA: Diagnosis not present

## 2021-09-04 DIAGNOSIS — R2689 Other abnormalities of gait and mobility: Secondary | ICD-10-CM | POA: Diagnosis not present

## 2021-09-04 NOTE — Therapy (Signed)
[place] MAIN [facility] SERVICES [address] [place], [place], [address] Phone: [phone]   Fax:  [phone]  Physical Therapy Treatment  Patient Details  Name: [name] MRN: [id number] Date of Birth: 02-17-1946 Referring Provider (PT): Merlene Pulling PA-C   Encounter Date: 09/04/2021   PT End of Session - 09/04/21 0855     Visit Number 25    Number of Visits 32    Date for PT Re-Evaluation 09/18/21    Authorization Type Medicare    Authorization Time Period recert 69/62-95/2/84    Progress Note Due on Visit 30    PT Start Time 0848    PT Stop Time 0930    PT Time Calculation (min) 42 min    Activity Tolerance Patient tolerated treatment well    Behavior During Therapy [facility] for tasks assessed/performed             Past Medical History:  Diagnosis Date   Allergic rhinitis    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   Heart murmur    mild mitral regurg-echo 2010   Hyperlipidemia    Hypertension    Lupus anticoagulant positive    Obesity    Pre-diabetes    Primary localized osteoarthritis of left hip 03/30/2019   Stroke [facility]) 2010   Some right sided weakness.  Gait issues.   Wears dentures    partial upper    Past Surgical History:  Procedure Laterality Date   ABDOMINAL HYSTERECTOMY  1981   APPENDECTOMY     AXILLARY LYMPH NODE BIOPSY Left 05/16/2014   Procedure: EXCISION 2CM LEFT AXILLARY MASS;  Surgeon: [name], MD;  Location: [place];  Service: General;  Laterality: Left;  Left axillary sebaceous cyst excision   CATARACT EXTRACTION W/PHACO Left 04/11/2020   Procedure: CATARACT EXTRACTION PHACO AND INTRAOCULAR LENS PLACEMENT ([place]) LEFT DIABETIC TORIC LENS  6.05  00:34.9;  Surgeon: [name], MD;  Location: [place];  Service: Ophthalmology;  Laterality: Left;   CATARACT EXTRACTION W/PHACO Right 12/12/2020   Procedure: CATARACT EXTRACTION PHACO AND INTRAOCULAR LENS PLACEMENT ([place]) RIGHT TORIC LENS;  Surgeon: [name], MD;  Location: [place];  Service: Ophthalmology;  Laterality: Right;  6.55 0:41.1   DILATION AND CURETTAGE OF UTERUS     SKIN CANCER EXCISION  2016   an[place]   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   Procedure: TOTAL HIP ARTHROPLASTY;  Surgeon: [name], MD;  [facility];  Service: Orthopedics;  Laterality: Left;    There were no vitals filed for this visit.   Subjective Assessment - 09/04/21 0854     Subjective Patient reports increased stiffness. She overdid it yesterday grocery shopping;    Pertinent History Pt reports she had a stroke in 2010 and was in inpatient rehab for 4 weeks. Pt reports she has been to therapy about one time a year since the onset of her stroke for various ailments including L THA in 2020. Pt reports she has bioness L300 which improved her drop foot on her R ankle. Pt has depended on her L LE since the stroke due to her weakness and impaired funciton on the right LE. Since the fall she has had  throbbing pain in the left LE which is exacerbated with increased weightbearing activity. Pt reports she has had x rays and it shows nothing is broken but despite this she has had continued pain in the left. Pt reports pain is on the lateral aspect of her left LE up into her posterior thigh. Pt reports doctor thinks this could be from nerve related pain but will need PT in order for her insurance to cover MRI of the lumbar spine. Prior to this fall she fell and broke her right wrist.  Pt reports she can perform any activities above the level of the wast but she is unable to bend over to complete tasks. Pt reports she has multiple wakers, 3 wheel, 4 wheel, wheelchair, standard rolling  walker. Pt had brain MRI following fall and no reports of bleeding or issues.    Limitations Walking    How long can you sit comfortably? a couple hours due to low back pain    How long can you stand comfortably? 15 minutes    How long can you walk comfortably? Able to walk around [employer] but was very fatigued following    Currently in Pain? Yes    Pain Score 5     Pain Location Leg    Pain Orientation Left;Lateral    Pain Descriptors / Indicators Aching;Tightness;Throbbing    Pain Type Chronic pain    Pain Radiating Towards Radiates ankle to back    Pain Onset More than a month ago    Pain Frequency Constant    Aggravating Factors  prolonged standing/walking    Pain Relieving Factors PT stretches/intervention    Effect of Pain on Daily Activities decreased activity tolerance;    Multiple Pain Sites No                 Treatment provided this session Required min A to transition to supine position:  Supine with moist heat to low back concurrent with exercise for comfort;    Ex:  Patient supine:  PT performed passive single knee to chest stretch 20 sec hold x1 reps LLE PT performed passive SLR hamstring stretch 30 sec hold x2 reps LLE,             Progressed to neural flossing with ankle DF/PF slow ROM x15 reps each set;   Piriformis muscular flexibility assessed and pt had mod limitations in muscular flexibility on the left 2* 30 sec piriformis stretch on L (regular and modified, with minimal ROM noted in regular position)  with similar cues to relax and allow therapist to assist with proper stretch    Required supervision for rolling to right sidelying with pillows under UE/LE;  Manual Therapy:   Manual therapy: performed with pt in right sidelying position   Soft tissue massage to left piriformis/posterior hip x15  min, utilized rolling stick and therapist hands with cross friction and myofascial release technique; Ischemic trigger point release to left piriformis 30  sec hold x2 reps  Identified tightness in left SI joint, PT performed grade II left innominate rotation glides/mobilization 20 sec bouts x1 rep each direction to faciltiate better mobility in hip;   After manual therapy, assisted patient in rolling supine, supervision Passive piriformis stretch LLE 30 sec hold x2 reps, partial ROM, patient able to tolerate increased ROM and reports less discomfort;  Patient supine:  PT performed passive single knee to chest stretch 20 sec hold x1 reps LLE PT performed passive SLR hamstring stretch 30 sec hold x2  reps LLE,             Progressed to neural flossing with ankle DF/PF slow ROM x15 reps each set;   Hooklying: Lumbar trunk rotation x10 reps with min A to increase ROM for better stretch and flexibility;    Required min A to transition to sitting position;    Patient exhibits better bed mobility ability when getting in/out on left side of bed. She was able to roll to right side significantly better with less discomfort;     Pt educated throughout session about proper posture and technique with exercises. Improved exercise technique, movement at target joints, use of target muscles after min to mod verbal, visual, tactile cues.   She tolerated session well reporting no LLE lower leg pain following manual therapy. She states, "I feel a lot better, its a lot looser"                                PT Education - 09/04/21 0855     Education Details positioning    Person(s) Educated Patient    Methods Explanation;Verbal cues    Comprehension Verbalized understanding;Returned demonstration;Verbal cues required;Need further instruction              PT Short Term Goals - 08/07/21 0903       PT SHORT TERM GOAL #1   Title Patient will be independent in home exercise program to improve strength/mobility for better functional independence with ADLs.    Baseline Met    Time 3    Period Days    Status Achieved      PT  SHORT TERM GOAL #2   Title Pt will be able to complete 6 MWT without requiring rest break or sitting in order to indicate ipmroved aerobic and muscular endurance for everyday tasks.    Baseline 835 feet with 3 wheel walker at PN 9/8    Time 4    Period Weeks    Status Achieved      PT SHORT TERM GOAL #3   Title Patient will complete five times sit to stand test in < 25 seconds indicating an increased LE strength and improved balance.    Baseline 16 sec 9/8 with UE use    Time 4    Period Weeks    Status Achieved    Target Date 06/18/21               PT Long Term Goals - 08/07/21 0903       PT LONG TERM GOAL #1   Title Pt will improve FOTO score to 60 in order to indicate improvement with everyday tasks    Baseline FOTO: 58 om 9/8 (met initial foto goal), 1025: 58%    Time 6    Period Weeks    Status On-going    Target Date 09/18/21      PT LONG TERM GOAL #2   Title Patient will complete five times sit to stand test in < 13 seconds indicating an increased LE strength and improved balance.    Baseline 5XSTS in 16 sec with UE use 06/21/21, 10/25: 18 sec with UE use    Time 6    Period Weeks    Status Not Met    Target Date 09/18/21      PT LONG TERM GOAL #3   Title Patient will increase six minute walk test distance to >900 feet  for progression to improve gait ability and safety with ambulation    Baseline 835 at progress note, 10/25: 650 feet with walker    Time 8    Period Weeks    Status Not Met    Target Date 09/18/21      PT LONG TERM GOAL #4   Title Pt will safely demonstrate ability to navigate up and down 4 stairs without assistance in order to improve ability to navigate stairs in her daily life    Baseline able to ascend 1 stair but too reports fear of descending more than 1 stair    Time 6    Period Weeks    Status Not Met    Target Date 09/18/21                   Plan - 09/04/21 0935     Clinical Impression Statement Patient tolerated  session well. She was able to exhibit better bed mobility this session when getting in/out on left side of bed. She continues to have tightness in left piriformis. She tolerated manual therapy well with reduction in LLE lower leg pain at end of session. PT also identified tightness in left SI joint. PT performed left innominate mobilizations to help reduce hip stiffness. Patient tolerated session well. She was able to exhibit better step length and reciprocal gait pattern at end of session. She would benefit from additional skilled PT intervention to improve strength, ROM and reduce pain with ADLs.    Personal Factors and Comorbidities Age;Comorbidity 1    Examination-Activity Limitations Bend;Carry;Squat;Stairs    Examination-Participation Restrictions Cleaning;Laundry    Stability/Clinical Decision Making Evolving/Moderate complexity    Rehab Potential Good    PT Frequency 2x / week    PT Duration 6 weeks    PT Treatment/Interventions ADLs/Self Care Home Management;Aquatic Therapy;Electrical Stimulation;Gait training;Stair training;Functional mobility training;Therapeutic activities;Therapeutic exercise;Balance training;Neuromuscular re-education;Patient/family education;Prosthetic Training;Manual techniques;Passive range of motion;Energy conservation    PT Next Visit Plan Contine stair training (rail on left ascending)    PT Home Exercise Plan No updates to HEP this date    Consulted and Agree with Plan of Care Patient             Patient will benefit from skilled therapeutic intervention in order to improve the following deficits and impairments:  Abnormal gait, Decreased balance, Decreased endurance, Decreased mobility, Decreased activity tolerance  Visit Diagnosis: Abnormality of gait and mobility  Difficulty in walking, not elsewhere classified  Unsteadiness on feet  Other lack of coordination  Muscle weakness (generalized)  Other abnormalities of gait and mobility  Pain in  left lower leg     Problem List Patient Active Problem List   Diagnosis Date Noted   Primary localized osteoarthritis of left hip 03/30/2019   Status post left hip replacement 03/30/2019    Fumiye Lubben, PT, DPT 09/04/2021, 9:37 AM  [place] [address] [place], [place], [address] Phone: [phone]   Fax:  [phone]  Name: SHYLYN YOUNCE MRN: [id number] Date of Birth: 08-27-1946

## 2021-09-11 ENCOUNTER — Other Ambulatory Visit (HOSPITAL_COMMUNITY): Payer: Self-pay | Admitting: *Deleted

## 2021-09-11 ENCOUNTER — Other Ambulatory Visit: Payer: Self-pay

## 2021-09-11 ENCOUNTER — Ambulatory Visit: Payer: Medicare Other

## 2021-09-11 DIAGNOSIS — R269 Unspecified abnormalities of gait and mobility: Secondary | ICD-10-CM | POA: Diagnosis not present

## 2021-09-11 DIAGNOSIS — R262 Difficulty in walking, not elsewhere classified: Secondary | ICD-10-CM | POA: Diagnosis not present

## 2021-09-11 DIAGNOSIS — R2689 Other abnormalities of gait and mobility: Secondary | ICD-10-CM | POA: Diagnosis not present

## 2021-09-11 DIAGNOSIS — M79662 Pain in left lower leg: Secondary | ICD-10-CM

## 2021-09-11 DIAGNOSIS — M6281 Muscle weakness (generalized): Secondary | ICD-10-CM | POA: Diagnosis not present

## 2021-09-11 DIAGNOSIS — R2681 Unsteadiness on feet: Secondary | ICD-10-CM | POA: Diagnosis not present

## 2021-09-11 NOTE — Therapy (Signed)
[place] MAIN [facility] SERVICES [address] [place], [place], [address] Phone: [phone]   Fax:  [phone]  Physical Therapy Treatment  Patient Details  Name: [name] MRN: [id number] Date of Birth: August 18, 1946 Referring Provider (PT): Merlene Pulling PA-C   Encounter Date: 09/11/2021   PT End of Session - 09/11/21 1039     Visit Number 26    Number of Visits 32    Date for PT Re-Evaluation 09/18/21    Authorization Type Medicare    Authorization Time Period recert 60/10-93/2/35    Progress Note Due on Visit 42    PT Start Time 0852    PT Stop Time 0930    PT Time Calculation (min) 38 min    Activity Tolerance Patient tolerated treatment well    Behavior During Therapy [facility] for tasks assessed/performed             Past Medical History:  Diagnosis Date   Allergic rhinitis    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   Heart murmur    mild mitral regurg-echo 2010   Hyperlipidemia    Hypertension    Lupus anticoagulant positive    Obesity    Pre-diabetes    Primary localized osteoarthritis of left hip 03/30/2019   Stroke [facility]) 2010   Some right sided weakness.  Gait issues.   Wears dentures    partial upper    Past Surgical History:  Procedure Laterality Date   ABDOMINAL HYSTERECTOMY  1981   APPENDECTOMY     AXILLARY LYMPH NODE BIOPSY Left 05/16/2014   Procedure: EXCISION 2CM LEFT AXILLARY MASS;  Surgeon: [name], MD;  Location: [place];  Service: General;  Laterality: Left;  Left axillary sebaceous cyst excision   CATARACT EXTRACTION W/PHACO Left 04/11/2020   Procedure: CATARACT EXTRACTION PHACO AND INTRAOCULAR LENS PLACEMENT ([place]) LEFT DIABETIC TORIC LENS  6.05  00:34.9;  Surgeon: [name], MD;  Location: [place];  Service: Ophthalmology;  Laterality: Left;   CATARACT EXTRACTION W/PHACO Right 12/12/2020   Procedure: CATARACT EXTRACTION PHACO AND INTRAOCULAR LENS PLACEMENT ([place]) RIGHT TORIC LENS;  Surgeon: [name], MD;  Location: [place];  Service: Ophthalmology;  Laterality: Right;  6.55 0:41.1   DILATION AND CURETTAGE OF UTERUS     SKIN CANCER EXCISION  2016   anl[place]   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   Procedure: TOTAL HIP ARTHROPLASTY;  Surgeon: [name], MD;  [facility];  Service: Orthopedics;  Laterality: Left;    There were no vitals filed for this visit.   Subjective Assessment - 09/11/21 1037     Subjective Pt reporting stiffness in back and LLE. Otherwise no other concerns or new changes since last visit.    Pertinent History Pt reports she had a stroke in 2010 and was in inpatient rehab for 4 weeks. Pt reports she has been to therapy about one time a year since the onset of her stroke for various ailments including L THA in 2020. Pt reports she has bioness L300 which improved her drop foot on her R ankle. Pt has depended on her L LE since the stroke due to her weakness and impaired funciton on the right  LE. Since the fall she has had throbbing pain in the left LE which is exacerbated with increased weightbearing activity. Pt reports she has had x rays and it shows nothing is broken but despite this she has had continued pain in the left. Pt reports pain is on the lateral aspect of her left LE up into her posterior thigh. Pt reports doctor thinks this could be from nerve related pain but will need PT in order for her insurance to cover MRI of the lumbar spine. Prior to this fall she fell and broke her right wrist.  Pt reports she can perform any activities above the level of the wast but she is unable to bend over to complete tasks. Pt reports she has multiple wakers, 3 wheel, 4 wheel,  wheelchair, standard rolling walker. Pt had brain MRI following fall and no reports of bleeding or issues.    Limitations Walking    How long can you sit comfortably? a couple hours due to low back pain    How long can you stand comfortably? 15 minutes    How long can you walk comfortably? Able to walk around [employer] but was very fatigued following    Currently in Pain? Yes    Pain Score 3     Pain Location Leg    Pain Orientation Left;Lateral    Pain Descriptors / Indicators Aching;Throbbing;Tightness    Pain Type Chronic pain    Pain Onset More than a month ago    Pain Frequency Constant            there.ex: Moist heat provided on low back and buttocks for tissue extensibility. Hook lying and supine  Single knee to chest passively: x5 bilat  SLR hamstring stretch: x5 bilat   LLE nerve floss with LLE with RLE in hook lying: 2x5. Cuing for maintaining LLE in extension at knee. Passively relied on PT to maintain knee in extension.   Lumbar trunk rotations in hook lying: x10/direction  Amb 100' post session with 3WW to re-assess symptoms. Pt still displaying step to pattern. Pt does endorse improvement in stiffness and pain with ambulation.   Manual Therapy:   In R side lying, IASTM to lateral hip with muscle roller. 8 min for pain modulation and improve tissue extensibility. Intermittent trigger point release to glut med with prolonged holds to trigger point to pt tolerance. Reduced symptoms reported and reduction of tautness in bands appreciated.     Post session skin assessed due to laying on moist hot pack. Normal pink color to skin appreciated with improved within a couple minutes to baseline skin tone. Slightly limited in session today due to pt being late to appointment.     PT Education - 09/11/21 1039     Education Details form/technique with exercise    Person(s) Educated Patient    Methods Explanation;Verbal cues    Comprehension Verbalized understanding;Returned  demonstration;Verbal cues required;Tactile cues required;Need further instruction              PT Short Term Goals - 08/07/21 0903       PT SHORT TERM GOAL #1   Title Patient will be independent in home exercise program to improve strength/mobility for better functional independence with ADLs.    Baseline Met    Time 3    Period Days    Status Achieved      PT SHORT TERM GOAL #2   Title Pt will be able to complete 6 MWT without requiring rest  break or sitting in order to indicate ipmroved aerobic and muscular endurance for everyday tasks.    Baseline 835 feet with 3 wheel walker at PN 9/8    Time 4    Period Weeks    Status Achieved      PT SHORT TERM GOAL #3   Title Patient will complete five times sit to stand test in < 25 seconds indicating an increased LE strength and improved balance.    Baseline 16 sec 9/8 with UE use    Time 4    Period Weeks    Status Achieved    Target Date 06/18/21               PT Long Term Goals - 08/07/21 0903       PT LONG TERM GOAL #1   Title Pt will improve FOTO score to 60 in order to indicate improvement with everyday tasks    Baseline FOTO: 58 om 9/8 (met initial foto goal), 1025: 58%    Time 6    Period Weeks    Status On-going    Target Date 09/18/21      PT LONG TERM GOAL #2   Title Patient will complete five times sit to stand test in < 13 seconds indicating an increased LE strength and improved balance.    Baseline 5XSTS in 16 sec with UE use 06/21/21, 10/25: 18 sec with UE use    Time 6    Period Weeks    Status Not Met    Target Date 09/18/21      PT LONG TERM GOAL #3   Title Patient will increase six minute walk test distance to >900 feet for progression to improve gait ability and safety with ambulation    Baseline 835 at progress note, 10/25: 650 feet with walker    Time 8    Period Weeks    Status Not Met    Target Date 09/18/21      PT LONG TERM GOAL #4   Title Pt will safely demonstrate ability to  navigate up and down 4 stairs without assistance in order to improve ability to navigate stairs in her daily life    Baseline able to ascend 1 stair but too reports fear of descending more than 1 stair    Time 6    Period Weeks    Status Not Met    Target Date 09/18/21                   Plan - 09/11/21 1040     Clinical Impression Statement Continuing PT POC with manual therapy and therex to improve LLE pain and stiffness. Pt required supervision for getting in and out of L side of bed but does require minA for R side lying. Requires frequent VC's for form/technique with exercise throughout session with fair carryover after cues. Pt endorses improvement in pain and stiffness post session but still displaying step to pattern wtih 3WW. Pt will continue to benefit form skilled PT services to progress strength, ROM and reduction in pain to improve ADL's.    Personal Factors and Comorbidities Age;Comorbidity 1    Examination-Activity Limitations Bend;Carry;Squat;Stairs    Examination-Participation Restrictions Cleaning;Laundry    Stability/Clinical Decision Making Evolving/Moderate complexity    Rehab Potential Good    PT Frequency 2x / week    PT Duration 6 weeks    PT Treatment/Interventions ADLs/Self Care Home Management;Aquatic Therapy;Electrical Stimulation;Gait training;Stair training;Functional mobility training;Therapeutic activities;Therapeutic exercise;Balance training;Neuromuscular  re-education;Patient/family education;Prosthetic Training;Manual techniques;Passive range of motion;Energy conservation    PT Next Visit Plan Contine stair training (rail on left ascending)    PT Home Exercise Plan No updates to HEP this date    Consulted and Agree with Plan of Care Patient             Patient will benefit from skilled therapeutic intervention in order to improve the following deficits and impairments:  Abnormal gait, Decreased balance, Decreased endurance, Decreased mobility,  Decreased activity tolerance  Visit Diagnosis: Abnormality of gait and mobility  Difficulty in walking, not elsewhere classified  Unsteadiness on feet  Muscle weakness (generalized)  Pain in left lower leg     Problem List Patient Active Problem List   Diagnosis Date Noted   Primary localized osteoarthritis of left hip 03/30/2019   Status post left hip replacement 03/30/2019    Salem Caster. Fairly IV, PT, DPT Physical Therapist- [facility]  09/11/2021, 10:49 AM  [place] MAIN [facility] SERVICES [address] [place], [place], [address] Phone: [phone]   Fax:  [phone]  Name: [name] MRN: [id number] Date of Birth: December 30, 1945

## 2021-09-12 ENCOUNTER — Ambulatory Visit (HOSPITAL_COMMUNITY)
Admission: RE | Admit: 2021-09-12 | Discharge: 2021-09-12 | Disposition: A | Discharge status: Home / Self Care | Payer: Medicare Other | Source: Ambulatory Visit | Attending: Internal Medicine | Admitting: Internal Medicine

## 2021-09-12 DIAGNOSIS — M810 Age-related osteoporosis without current pathological fracture: Secondary | ICD-10-CM | POA: Insufficient documentation

## 2021-09-12 DIAGNOSIS — Z7901 Long term (current) use of anticoagulants: Secondary | ICD-10-CM | POA: Diagnosis not present

## 2021-09-12 MED ORDER — DENOSUMAB 60 MG/ML ~~LOC~~ SOSY: 60.0000 mg | PREFILLED_SYRINGE | Freq: Once | SUBCUTANEOUS | Status: AC

## 2021-09-12 MED ORDER — DENOSUMAB 60 MG/ML ~~LOC~~ SOSY
PREFILLED_SYRINGE | SUBCUTANEOUS | Status: AC
  Administered 2021-09-12: 60 mg via SUBCUTANEOUS
  Filled 2021-09-12: qty 1

## 2021-09-13 ENCOUNTER — Ambulatory Visit: Payer: Medicare Other | Admitting: Physical Therapy

## 2021-09-18 ENCOUNTER — Other Ambulatory Visit: Payer: Self-pay

## 2021-09-18 ENCOUNTER — Encounter: Payer: Self-pay | Admitting: Physical Therapy

## 2021-09-18 ENCOUNTER — Ambulatory Visit: Payer: Medicare Other | Attending: Surgical | Admitting: Physical Therapy

## 2021-09-18 DIAGNOSIS — R262 Difficulty in walking, not elsewhere classified: Secondary | ICD-10-CM | POA: Diagnosis not present

## 2021-09-18 DIAGNOSIS — R2689 Other abnormalities of gait and mobility: Secondary | ICD-10-CM | POA: Insufficient documentation

## 2021-09-18 DIAGNOSIS — R2681 Unsteadiness on feet: Secondary | ICD-10-CM | POA: Diagnosis not present

## 2021-09-18 DIAGNOSIS — Z9181 History of falling: Secondary | ICD-10-CM | POA: Diagnosis not present

## 2021-09-18 DIAGNOSIS — R278 Other lack of coordination: Secondary | ICD-10-CM | POA: Insufficient documentation

## 2021-09-18 DIAGNOSIS — M6281 Muscle weakness (generalized): Secondary | ICD-10-CM | POA: Insufficient documentation

## 2021-09-18 DIAGNOSIS — M79662 Pain in left lower leg: Secondary | ICD-10-CM | POA: Diagnosis not present

## 2021-09-18 DIAGNOSIS — R269 Unspecified abnormalities of gait and mobility: Secondary | ICD-10-CM | POA: Diagnosis not present

## 2021-09-18 NOTE — Therapy (Signed)
[place] MAIN [facility] SERVICES [address] [place], [place], [address] Phone: [phone]   Fax:  [phone]  Physical Therapy Treatment  Patient Details  Name: [name] MRN: [id number] Date of Birth: January 19, 1946 Referring Provider (PT): Merlene Pulling PA-C   Encounter Date: 09/18/2021   PT End of Session - 09/18/21 0915     Visit Number 27    Number of Visits 32    Date for PT Re-Evaluation 09/18/21    Authorization Type Medicare    Authorization Time Period recert 56/70-14/1/03    Progress Note Due on Visit 30    PT Start Time 0908    PT Stop Time 0950    PT Time Calculation (min) 42 min    Equipment Utilized During Treatment Gait belt    Activity Tolerance Patient tolerated treatment well    Behavior During Therapy WFL for tasks assessed/performed             Past Medical History:  Diagnosis Date   Allergic rhinitis    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   Heart murmur    mild mitral regurg-echo 2010   Hyperlipidemia    Hypertension    Lupus anticoagulant positive    Obesity    Pre-diabetes    Primary localized osteoarthritis of left hip 03/30/2019   Stroke [facility]) 2010   Some right sided weakness.  Gait issues.   Wears dentures    partial upper    Past Surgical History:  Procedure Laterality Date   ABDOMINAL HYSTERECTOMY  1981   APPENDECTOMY     AXILLARY LYMPH NODE BIOPSY Left 05/16/2014   Procedure: EXCISION 2CM LEFT AXILLARY MASS;  Surgeon: [name], MD;  Location: [place];  Service: General;  Laterality: Left;  Left axillary sebaceous cyst excision   CATARACT EXTRACTION W/PHACO Left 04/11/2020   Procedure: CATARACT EXTRACTION PHACO AND INTRAOCULAR  LENS PLACEMENT ([place]) LEFT DIABETIC TORIC LENS  6.05 00:34.9;  Surgeon: [name], MD;  Location: [place];  Service: Ophthalmology;  Laterality: Left;   CATARACT EXTRACTION W/PHACO Right 12/12/2020   Procedure: CATARACT EXTRACTION PHACO AND INTRAOCULAR LENS PLACEMENT ([place]) RIGHT TORIC LENS;  Surgeon: [name], MD;  Location: [place];  Service: Ophthalmology;  Laterality: Right;  6.55 0:41.1   DILATION AND CURETTAGE OF UTERUS     SKIN CANCER EXCISION  2016   [facility]   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   Procedure: TOTAL HIP ARTHROPLASTY;  Surgeon: [name], MD;  [facility];  Service: Orthopedics;  Laterality: Left;    There were no vitals filed for this visit.   Subjective Assessment - 09/18/21 0914     Subjective Pt reports mild pain in LLE. She reports no new changes; She did have infusion last week at [facility] and has had some flu like response but is getting better.    Pertinent History Pt reports she had a stroke in 2010 and was in inpatient rehab for 4 weeks. Pt reports she has been to therapy about one time a year since the onset of her stroke for various ailments including L THA in 2020. Pt reports she has bioness L300 which improved her drop foot on  her R ankle. Pt has depended on her L LE since the stroke due to her weakness and impaired funciton on the right LE. Since the fall she has had throbbing pain in the left LE which is exacerbated with increased weightbearing activity. Pt reports she has had x rays and it shows nothing is broken but despite this she has had continued pain in the left. Pt reports pain is on the lateral aspect of her left LE up into her posterior thigh. Pt reports doctor thinks this could be from nerve related pain but will need PT in order for her insurance to cover MRI of the lumbar spine. Prior to this fall she fell and broke her right wrist.  Pt reports she can perform any activities above the level of the wast  but she is unable to bend over to complete tasks. Pt reports she has multiple wakers, 3 wheel, 4 wheel, wheelchair, standard rolling walker. Pt had brain MRI following fall and no reports of bleeding or issues.    Limitations Walking    How long can you sit comfortably? a couple hours due to low back pain    How long can you stand comfortably? 15 minutes    How long can you walk comfortably? Able to walk around [employer] but was very fatigued following    Currently in Pain? Yes    Pain Score 3     Pain Location Leg    Pain Orientation Left;Lateral    Pain Descriptors / Indicators Aching;Throbbing    Pain Type Chronic pain    Pain Onset More than a month ago    Pain Frequency Constant    Aggravating Factors  prolonged standing/walking    Pain Relieving Factors PT stretches/intervention    Effect of Pain on Daily Activities decreased activity tolerance;    Multiple Pain Sites No                  Treatment provided this session Patient Mod I to transition to supine position:  Supine with moist heat to low back concurrent with exercise for comfort;    Ex:  Patient supine:  PT performed passive single knee to chest stretch 20 sec hold x1 reps LLE PT performed passive SLR hamstring stretch 30 sec hold x2 reps LLE,             Progressed to neural flossing with ankle DF/PF slow ROM x15 reps each set;   Hooklying: PBall lumbar trunk rotation x1 min with partial ROM, tightness and discomfort felt on right side;   Piriformis muscular flexibility assessed and pt had mod limitations in muscular flexibility on the left 2* 30 sec piriformis stretch on L (regular and modified, with minimal ROM noted in regular position)  with similar cues to relax and allow therapist to assist with proper stretch    Required Min A for rolling to right sidelying with pillows under UE/LE;  Manual Therapy:   Manual therapy: performed with pt in right sidelying position   Soft tissue massage to left  piriformis/posterior hip x15  min, utilized rolling stick and therapist hands with cross friction and myofascial release technique; Ischemic trigger point release to left piriformis 30 sec hold x2 reps     After manual therapy, assisted patient in rolling supine, supervision Passive piriformis stretch LLE 30 sec hold x2 reps, partial ROM, patient able to tolerate increased ROM and reports less discomfort;     Required min A to transition to sitting position;  Patient exhibits better bed mobility ability when getting in/out on left side of bed. She was able to roll to right side significantly better with less discomfort;   Gait around gym with 3 wheeled walker x80 feet with better reciprocal gait pattern. Patient denies any LE pain and reports less stiffness   Patient instructed in balance exercise: Standing on airex pad: -alternate toe taps to 6 inch step with 2-1 rail assist x15 reps each LE Standing one foot on airex, one foot on 8 inch step:  With 2-0 rail assist, 10 sec hold x1 rep each LE, min A required with moderate unsteadiness reported Attempted standing feet apart unsupported, patient unable Removed airex pad with patient on firm surface Feet apart:  BUE ball pass side/side x5 reps  Holding ball in BUE up/down lift x10 reps Patient very hesitant to let go of support with high fear of falling, requiring min A for safety  She does report increased LLE discomfort with prolonged standing of 2/10;      Pt educated throughout session about proper posture and technique with exercises. Improved exercise technique, movement at target joints, use of target muscles after min to mod verbal, visual, tactile cues.                             PT Education - 09/18/21 0915     Education Details positioning/strengthening;    Person(s) Educated Patient    Methods Explanation;Verbal cues    Comprehension Verbalized understanding;Returned demonstration;Verbal cues  required;Need further instruction              PT Short Term Goals - 08/07/21 0903       PT SHORT TERM GOAL #1   Title Patient will be independent in home exercise program to improve strength/mobility for better functional independence with ADLs.    Baseline Met    Time 3    Period Days    Status Achieved      PT SHORT TERM GOAL #2   Title Pt will be able to complete 6 MWT without requiring rest break or sitting in order to indicate ipmroved aerobic and muscular endurance for everyday tasks.    Baseline 835 feet with 3 wheel walker at PN 9/8    Time 4    Period Weeks    Status Achieved      PT SHORT TERM GOAL #3   Title Patient will complete five times sit to stand test in < 25 seconds indicating an increased LE strength and improved balance.    Baseline 16 sec 9/8 with UE use    Time 4    Period Weeks    Status Achieved    Target Date 06/18/21               PT Long Term Goals - 08/07/21 0903       PT LONG TERM GOAL #1   Title Pt will improve FOTO score to 60 in order to indicate improvement with everyday tasks    Baseline FOTO: 58 om 9/8 (met initial foto goal), 1025: 58%    Time 6    Period Weeks    Status On-going    Target Date 09/18/21      PT LONG TERM GOAL #2   Title Patient will complete five times sit to stand test in < 13 seconds indicating an increased LE strength and improved balance.    Baseline 5XSTS in 16 sec  with UE use 06/21/21, 10/25: 18 sec with UE use    Time 6    Period Weeks    Status Not Met    Target Date 09/18/21      PT LONG TERM GOAL #3   Title Patient will increase six minute walk test distance to >900 feet for progression to improve gait ability and safety with ambulation    Baseline 835 at progress note, 10/25: 650 feet with walker    Time 8    Period Weeks    Status Not Met    Target Date 09/18/21      PT LONG TERM GOAL #4   Title Pt will safely demonstrate ability to navigate up and down 4 stairs without assistance in  order to improve ability to navigate stairs in her daily life    Baseline able to ascend 1 stair but too reports fear of descending more than 1 stair    Time 6    Period Weeks    Status Not Met    Target Date 09/18/21                   Plan - 09/18/21 1213     Clinical Impression Statement Patient motivated and participated well within session. She was instructed in LE ROM exercise. Patient continues to have tightness in LE hamstring and piriformis which limits mobility; Patient was able to tolerate manual therapy well reporting less pain in LLE. Patient instructed in balance exercise. She continues to have high fear of falling, requiring intermittent rail assist. She was hesitant to reduce rail assist when on airex pad despite assistance. She required min A for safety; Patient would benefit from additional skilled PT Intervention to improve strength, balance and mobility;    Personal Factors and Comorbidities Age;Comorbidity 1    Examination-Activity Limitations Bend;Carry;Squat;Stairs    Examination-Participation Restrictions Cleaning;Laundry    Stability/Clinical Decision Making Evolving/Moderate complexity    Rehab Potential Good    PT Frequency 2x / week    PT Duration 6 weeks    PT Treatment/Interventions ADLs/Self Care Home Management;Aquatic Therapy;Electrical Stimulation;Gait training;Stair training;Functional mobility training;Therapeutic activities;Therapeutic exercise;Balance training;Neuromuscular re-education;Patient/family education;Prosthetic Training;Manual techniques;Passive range of motion;Energy conservation    PT Next Visit Plan Contine stair training (rail on left ascending)    PT Home Exercise Plan No updates to HEP this date    Consulted and Agree with Plan of Care Patient             Patient will benefit from skilled therapeutic intervention in order to improve the following deficits and impairments:  Abnormal gait, Decreased balance, Decreased  endurance, Decreased mobility, Decreased activity tolerance  Visit Diagnosis: Difficulty in walking, not elsewhere classified  Unsteadiness on feet  Muscle weakness (generalized)  Pain in left lower leg  Other lack of coordination  Other abnormalities of gait and mobility     Problem List Patient Active Problem List   Diagnosis Date Noted   Primary localized osteoarthritis of left hip 03/30/2019   Status post left hip replacement 03/30/2019    Kloee Ballew, PT, DPT 09/18/2021, 12:16 PM  [place] [address] [place], [place], [address] Phone: [phone]   Fax:  [phone]  Name: [name] MRN: [id number] Date of Birth: 04-12-1946

## 2021-09-20 ENCOUNTER — Ambulatory Visit: Payer: Medicare Other | Admitting: Physical Therapy

## 2021-09-20 ENCOUNTER — Other Ambulatory Visit: Payer: Self-pay

## 2021-09-20 DIAGNOSIS — R2681 Unsteadiness on feet: Secondary | ICD-10-CM | POA: Diagnosis not present

## 2021-09-20 DIAGNOSIS — M6281 Muscle weakness (generalized): Secondary | ICD-10-CM

## 2021-09-20 DIAGNOSIS — R262 Difficulty in walking, not elsewhere classified: Secondary | ICD-10-CM

## 2021-09-20 DIAGNOSIS — M79662 Pain in left lower leg: Secondary | ICD-10-CM | POA: Diagnosis not present

## 2021-09-20 DIAGNOSIS — R278 Other lack of coordination: Secondary | ICD-10-CM | POA: Diagnosis not present

## 2021-09-20 DIAGNOSIS — R2689 Other abnormalities of gait and mobility: Secondary | ICD-10-CM | POA: Diagnosis not present

## 2021-09-20 NOTE — Therapy (Signed)
[place] MAIN [facility] SERVICES [address] [place], [place], [address] Phone: [phone]   Fax:  [phone]  Physical Therapy Treatment/ RECERT   Patient Details  Name: [name] MRN: [id number] Date of Birth: 12/13/1945 Referring Provider (PT): Merlene Pulling PA-C   Encounter Date: 09/20/2021   PT End of Session - 09/20/21 1152     Visit Number 28    Date for PT Re-Evaluation 12/13/21    Authorization Time Period recert 93/8/10-10/20/49    Progress Note Due on Visit 24    PT Start Time 0936    PT Stop Time 1015    PT Time Calculation (min) 39 min             Past Medical History:  Diagnosis Date   Allergic rhinitis    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   Heart murmur    mild mitral regurg-echo 2010   Hyperlipidemia    Hypertension    Lupus anticoagulant positive    Obesity    Pre-diabetes    Primary localized osteoarthritis of left hip 03/30/2019   Stroke [facility]) 2010   Some right sided weakness.  Gait issues.   Wears dentures    partial upper    Past Surgical History:  Procedure Laterality Date   ABDOMINAL HYSTERECTOMY  1981   APPENDECTOMY     AXILLARY LYMPH NODE BIOPSY Left 05/16/2014   Procedure: EXCISION 2CM LEFT AXILLARY MASS;  Surgeon: [name], MD;  Location: [place];  Service: General;  Laterality: Left;  Left axillary sebaceous cyst excision   CATARACT EXTRACTION W/PHACO Left 04/11/2020   Procedure: CATARACT EXTRACTION PHACO AND INTRAOCULAR LENS PLACEMENT ([place]) LEFT DIABETIC TORIC LENS  6.05 00:34.9;  Surgeon: [name], MD;  Location: [place];  Service: Ophthalmology;  Laterality: Left;   CATARACT EXTRACTION W/PHACO Right 12/12/2020    Procedure: CATARACT EXTRACTION PHACO AND INTRAOCULAR LENS PLACEMENT ([place]) RIGHT TORIC LENS;  Surgeon: [name], MD;  Location: [place];  Service: Ophthalmology;  Laterality: Right;  6.55 0:41.1   DILATION AND CURETTAGE OF UTERUS     SKIN CANCER EXCISION  2016   anl[place]   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   Procedure: TOTAL HIP ARTHROPLASTY;  Surgeon: [name], MD;  [facility];  Service: Orthopedics;  Laterality: Left;    There were no vitals filed for this visit.   Subjective Assessment - 09/20/21 0938     Subjective Pt reports her close friend recently passed away and she has been a upset since. Pt reports increased pain in her L LE yesterday and continued into today.    Pertinent History Pt reports she had a stroke in 2010 and was in inpatient rehab for 4 weeks. Pt reports she has been to therapy about one time a year since the onset of her stroke for various ailments including L THA in 2020. Pt reports she has bioness L300 which improved her drop foot on her R ankle. Pt has depended on her L LE since the stroke due to her weakness and impaired funciton on the right LE. Since the fall she has had throbbing pain in the left LE which is exacerbated with increased weightbearing  activity. Pt reports she has had x rays and it shows nothing is broken but despite this she has had continued pain in the left. Pt reports pain is on the lateral aspect of her left LE up into her posterior thigh. Pt reports doctor thinks this could be from nerve related pain but will need PT in order for her insurance to cover MRI of the lumbar spine. Prior to this fall she fell and broke her right wrist.  Pt reports she can perform any activities above the level of the wast but she is unable to bend over to complete tasks. Pt reports she has multiple wakers, 3 wheel, 4 wheel, wheelchair, standard rolling walker. Pt had brain MRI following fall and no reports of bleeding or issues.     Limitations Walking    How long can you sit comfortably? a couple hours due to low back pain    How long can you stand comfortably? 15 minutes    How long can you walk comfortably? Able to walk around [place] but was very fatigued following    Currently in Pain? Yes    Pain Score 5     Pain Location Leg    Pain Orientation Left;Lateral    Pain Descriptors / Indicators Aching;Throbbing    Pain Type Chronic pain    Pain Onset More than a month ago    Pain Frequency Constant    Aggravating Factors  prolonged standing/ walking    Pain Relieving Factors PT intervention             Physical therapy treatment session today consisted of completing assessment of goals and administration of testing as demonstrated in flow sheet and goals sections . Addition treatments may be found below.    Exercise/Activity Sets/Reps/Time/ Resistance Assistance Charge type Comments  Transition from sitting to sidelying  Min Therex Significant difficulty with transitioning to sidelying, pt feels like she is falling despite lying supine on mat table without being near edge of mat. Pt requires 3 pillow for transition and frequent repositioning.   Manual therapy: STM, TPP, cross fristion  X12 min  Manual Pirifirofmis had several TTP areas and trigger points. Following manual therapy pt pain decreased from 5/10 to 0 / 10    6MWT: 825 feet with 3WW    Fatigue noted around minute 3 and pt pace decreased.Seated rest following   FOTO: 57      5XSTS:  15.1 sec    UE use, improved time compared to last note.                                       Treatment Provided this session   Pt educated throughout session about proper posture and technique with exercises. Improved exercise technique, movement at target joints, use of target muscles after min to mod verbal, visual, tactile cues. Note: Portions of this document were prepared using Dragon voice recognition software and although reviewed may contain unintentional  dictation errors in syntax, grammar, or spelling.                           PT Education - 09/20/21 1151     Education Details Progress with therapy thus far    Person(s) Educated Patient    Methods Explanation    Comprehension Verbalized understanding  PT Short Term Goals - 08/07/21 0903       PT SHORT TERM GOAL #1   Title Patient will be independent in home exercise program to improve strength/mobility for better functional independence with ADLs.    Baseline Met    Time 3    Period Days    Status Achieved      PT SHORT TERM GOAL #2   Title Pt will be able to complete 6 MWT without requiring rest break or sitting in order to indicate ipmroved aerobic and muscular endurance for everyday tasks.    Baseline 835 feet with 3 wheel walker at PN 9/8    Time 4    Period Weeks    Status Achieved      PT SHORT TERM GOAL #3   Title Patient will complete five times sit to stand test in < 25 seconds indicating an increased LE strength and improved balance.    Baseline 16 sec 9/8 with UE use    Time 4    Period Weeks    Status Achieved    Target Date 06/18/21               PT Long Term Goals - 09/20/21 1158       PT LONG TERM GOAL #1   Title Pt will improve FOTO score to 60 in order to indicate improvement with everyday tasks    Baseline FOTO: 58 om 9/8 (met initial foto goal), 1025: 58% 12/8: 57%    Time 12    Period Weeks    Status On-going    Target Date 12/13/21      PT LONG TERM GOAL #2   Title Patient will complete five times sit to stand test in < 13 seconds indicating an increased LE strength and improved balance.    Baseline 5XSTS in 16 sec with UE use 06/21/21, 10/25: 18 sec with UE use, 15.1 sec on 12/8    Time 8    Period Weeks    Status On-going    Target Date 11/15/21      PT LONG TERM GOAL #3   Title Patient will increase six minute walk test distance to >900 feet for progression to improve gait ability and safety  with ambulation    Baseline 10/25: 650 feet with walker, 12/8: 825 with 3WW    Time 8    Period Weeks    Status On-going    Target Date 09/18/21      PT LONG TERM GOAL #4   Title Pt will safely demonstrate ability to navigate up and down 4 stairs without assistance in order to improve ability to navigate stairs in her daily life    Baseline able to ascend 1 stair but too reports fear of descending more than 1 stair, 12/8: not assessed    Time 6    Period Weeks    Status Not Met    Target Date 09/18/21                   Plan - 09/20/21 1153     Clinical Impression Statement Patient presents to physical therapy for recertification.  Patient's goals were assessed and patient has made good progress toward all of her goals.  Unable to assess steroid goal today but will continue to work on his future sessions.  Patient continues to have significant difficulty with bed mobility and requires significant assistance with set up as well as with movement support to transition to  side-lying on her right side patient also continues to have intermittent pain in her left lower extremity which is significantly improved following therapy has had consistent decrease in pain over the last several weeks although pain is not yet abolished.  Patient made progress with both her 5 times sit to stand test as well as with her 6-minute walk test compared to previous progress note.  Patient will continue to benefit from skilled physical therapy duration up to improve her strength, pain, mobility, ability to navigate stairs, as well as improve her overall quality of life.    Personal Factors and Comorbidities Age;Comorbidity 1    Examination-Activity Limitations Bend;Carry;Squat;Stairs    Examination-Participation Restrictions Cleaning;Laundry    Stability/Clinical Decision Making Evolving/Moderate complexity    Rehab Potential Good    PT Frequency 2x / week    PT Duration 6 weeks    PT Treatment/Interventions  ADLs/Self Care Home Management;Aquatic Therapy;Electrical Stimulation;Gait training;Stair training;Functional mobility training;Therapeutic activities;Therapeutic exercise;Balance training;Neuromuscular re-education;Patient/family education;Prosthetic Training;Manual techniques;Passive range of motion;Energy conservation    PT Next Visit Plan Contine stair training (rail on left ascending)    PT Home Exercise Plan No updates to HEP this date    Consulted and Agree with Plan of Care Patient             Patient will benefit from skilled therapeutic intervention in order to improve the following deficits and impairments:  Abnormal gait, Decreased balance, Decreased endurance, Decreased mobility, Decreased activity tolerance  Visit Diagnosis: Difficulty in walking, not elsewhere classified  Unsteadiness on feet  Muscle weakness (generalized)  Pain in left lower leg     Problem List Patient Active Problem List   Diagnosis Date Noted   Primary localized osteoarthritis of left hip 03/30/2019   Status post left hip replacement 03/30/2019    Particia Lather, PT 09/20/2021, 12:00 PM  [place] [address] [place], [place], [address] Phone: [phone]   Fax:  [phone]  Name: [name] MRN: [id number] Date of Birth: 1946-09-18

## 2021-09-25 ENCOUNTER — Encounter: Payer: Self-pay | Admitting: Physical Therapy

## 2021-09-25 ENCOUNTER — Ambulatory Visit: Payer: Medicare Other | Admitting: Physical Therapy

## 2021-09-25 ENCOUNTER — Other Ambulatory Visit: Payer: Self-pay

## 2021-09-25 DIAGNOSIS — M6281 Muscle weakness (generalized): Secondary | ICD-10-CM

## 2021-09-25 DIAGNOSIS — R278 Other lack of coordination: Secondary | ICD-10-CM | POA: Diagnosis not present

## 2021-09-25 DIAGNOSIS — R2689 Other abnormalities of gait and mobility: Secondary | ICD-10-CM | POA: Diagnosis not present

## 2021-09-25 DIAGNOSIS — M79662 Pain in left lower leg: Secondary | ICD-10-CM | POA: Diagnosis not present

## 2021-09-25 DIAGNOSIS — R262 Difficulty in walking, not elsewhere classified: Secondary | ICD-10-CM | POA: Diagnosis not present

## 2021-09-25 DIAGNOSIS — R2681 Unsteadiness on feet: Secondary | ICD-10-CM | POA: Diagnosis not present

## 2021-09-25 NOTE — Therapy (Signed)
[place] MAIN [facility] SERVICES [place], [place], [address] Phone: [phone]   Fax:  [phone]  Physical Therapy Treatment  Patient Details  Name: [name] MRN: [id number] Date of Birth: July 23, 1946 Referring Provider (PT): Merlene Pulling PA-C   Encounter Date: 09/25/2021   PT End of Session - 09/25/21 1020     Visit Number 29    Number of Visits 50    Date for PT Re-Evaluation 12/13/21    Authorization Time Period recert 28/0/03-01/21/16    Progress Note Due on Visit 60    PT Start Time 0935    PT Stop Time 1015    PT Time Calculation (min) 40 min    Equipment Utilized During Treatment Gait belt    Activity Tolerance Patient tolerated treatment well    Behavior During Therapy WFL for tasks assessed/performed             Past Medical History:  Diagnosis Date   Allergic rhinitis    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   Heart murmur    mild mitral regurg-echo 2010   Hyperlipidemia    Hypertension    Lupus anticoagulant positive    Obesity    Pre-diabetes    Primary localized osteoarthritis of left hip 03/30/2019   Stroke [facility]) 2010   Some right sided weakness.  Gait issues.   Wears dentures    partial upper    Past Surgical History:  Procedure Laterality Date   ABDOMINAL HYSTERECTOMY  1981   APPENDECTOMY     AXILLARY LYMPH NODE BIOPSY Left 05/16/2014   Procedure: EXCISION 2CM LEFT AXILLARY MASS;  Surgeon: [name], MD;  Location: [place];  Service: General;  Laterality: Left;  Left axillary sebaceous cyst excision   CATARACT EXTRACTION W/PHACO Left 04/11/2020   Procedure: CATARACT EXTRACTION PHACO AND INTRAOCULAR LENS PLACEMENT ([place]) LEFT  DIABETIC TORIC LENS  6.05 00:34.9;  Surgeon: [name], MD;  Location: [place];  Service: Ophthalmology;  Laterality: Left;   CATARACT EXTRACTION W/PHACO Right 12/12/2020   Procedure: CATARACT EXTRACTION PHACO AND INTRAOCULAR LENS PLACEMENT ([place]) RIGHT TORIC LENS;  Surgeon: [name], MD;  Location: [place];  Service: Ophthalmology;  Laterality: Right;  6.55 0:41.1   DILATION AND CURETTAGE OF UTERUS     SKIN CANCER EXCISION  2016   [email or web address]   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   Procedure: TOTAL HIP ARTHROPLASTY;  Surgeon: [name], MD;  Location: [facility];  Service: Orthopedics;  Laterality: Left;    There were no vitals filed for this visit.   Subjective Assessment - 09/25/21 0941     Subjective Patient reports continued soreness in LLE; She reports increased stiffness today;    Pertinent History Pt reports she had a stroke in 2010 and was in inpatient rehab for 4 weeks. Pt reports she has been to therapy about one time a year since the onset of her stroke for various ailments including L THA in 2020. Pt reports she has bioness L300 which improved her drop foot on her R ankle. Pt has depended on her L LE since the stroke due to her weakness and impaired funciton on the right LE. Since the  fall she has had throbbing pain in the left LE which is exacerbated with increased weightbearing activity. Pt reports she has had x rays and it shows nothing is broken but despite this she has had continued pain in the left. Pt reports pain is on the lateral aspect of her left LE up into her posterior thigh. Pt reports doctor thinks this could be from nerve related pain but will need PT in order for her insurance to cover MRI of the lumbar spine. Prior to this fall she fell and broke her right wrist.  Pt reports she can perform any activities above the level of the wast but she is unable to bend over to complete tasks. Pt reports she has multiple wakers, 3 wheel, 4 wheel,  wheelchair, standard rolling walker. Pt had brain MRI following fall and no reports of bleeding or issues.    Limitations Walking    How long can you sit comfortably? a couple hours due to low back pain    How long can you stand comfortably? 15 minutes    How long can you walk comfortably? Able to walk around [place] but was very fatigued following    Currently in Pain? Yes    Pain Score 3     Pain Location Leg    Pain Orientation Left    Pain Descriptors / Indicators Aching;Throbbing    Pain Type Chronic pain    Pain Onset More than a month ago    Pain Frequency Constant    Aggravating Factors  prolonged standing/walking    Pain Relieving Factors PT intervention    Effect of Pain on Daily Activities decreased activity tolerance;    Multiple Pain Sites No                  Treatment provided this session Patient Mod I to transition to supine position:  Supine with moist heat to low back concurrent with exercise for comfort;    Ex:  Patient supine:  PT performed passive single knee to chest stretch 20 sec hold x2 reps each LE PT performed passive SLR hamstring stretch 30 sec hold x2 reps each LE,             Progressed to neural flossing with ankle DF/PF slow ROM x15 reps LLE only   Hooklying: lumbar trunk rotation x1 min with better ROM noted this session compared to previous sessions;    Piriformis muscular flexibility assessed and pt had mod limitations in muscular flexibility on the left 2* 30 sec piriformis stretch on L (regular and modified, with minimal ROM noted in regular position)  with similar cues to relax and allow therapist to assist with proper stretch    Required Min A for rolling to right sidelying with pillows under UE/LE;  Manual Therapy:   Manual therapy: performed with pt in right sidelying position   Soft tissue massage to left piriformis/posterior hip, and greater trochanter along IT band x15  min, utilized rolling stick and therapist hands with  cross friction and myofascial release technique; Ischemic trigger point release to left piriformis 30 sec hold x2 reps     After manual therapy, assisted patient in rolling supine, supervision Passive piriformis stretch LLE 30 sec hold x2 reps, partial ROM, patient able to tolerate increased ROM and reports less discomfort;     Required Supervision to transition to sitting position;    Patient exhibits better bed mobility ability when getting in/out on left side of bed. She was able  to roll to right side significantly better with less discomfort;   Gait around gym with 3 wheeled walker x140 feet with better reciprocal gait pattern. Patient denies any LE pain and reports less stiffness     Patient instructed in balance exercise: Standing on airex pad: Feet apart:             Unsupported standing 30 sec hold, min A with increased instability noted with prolonged standing  Progressed to side/side weight shift with 1-0 rail assist x10 reps with cues for increased hip movement and reduce upper trunk lean, initially patient unable to achieve lateral weight shift in LE but was just moving shoulders; with increased repetition she was able to isolate lateral weight shift in LE but has increased difficulty with reduce rail assist;   BLE heel raises 2-1-attempted no rail, moderate difficulty x15 reps total   She does report increased LLE discomfort with prolonged standing of 2/10;       Pt educated throughout session about proper posture and technique with exercises. Improved exercise technique, movement at target joints, use of target muscles after min to mod verbal, visual, tactile cues.    Patient tolerated session well. She reports less stiffness and is able to exhibit better step length during gait following stretches/manual therapy; Patient continues to have high fear of falling with difficulty reducing rail assist while standing on airex pad;                           PT  Education - 09/25/21 1020     Education Details LE ROM/manual therapy, balance;    Person(s) Educated Patient    Methods Explanation;Verbal cues    Comprehension Verbalized understanding;Returned demonstration;Verbal cues required;Need further instruction              PT Short Term Goals - 08/07/21 0903       PT SHORT TERM GOAL #1   Title Patient will be independent in home exercise program to improve strength/mobility for better functional independence with ADLs.    Baseline Met    Time 3    Period Days    Status Achieved      PT SHORT TERM GOAL #2   Title Pt will be able to complete 6 MWT without requiring rest break or sitting in order to indicate ipmroved aerobic and muscular endurance for everyday tasks.    Baseline 835 feet with 3 wheel walker at PN 9/8    Time 4    Period Weeks    Status Achieved      PT SHORT TERM GOAL #3   Title Patient will complete five times sit to stand test in < 25 seconds indicating an increased LE strength and improved balance.    Baseline 16 sec 9/8 with UE use    Time 4    Period Weeks    Status Achieved    Target Date 06/18/21               PT Long Term Goals - 09/20/21 1158       PT LONG TERM GOAL #1   Title Pt will improve FOTO score to 60 in order to indicate improvement with everyday tasks    Baseline FOTO: 58 om 9/8 (met initial foto goal), 1025: 58% 12/8: 57%    Time 12    Period Weeks    Status On-going    Target Date 12/13/21      PT  LONG TERM GOAL #2   Title Patient will complete five times sit to stand test in < 13 seconds indicating an increased LE strength and improved balance.    Baseline 5XSTS in 16 sec with UE use 06/21/21, 10/25: 18 sec with UE use, 15.1 sec on 12/8    Time 8    Period Weeks    Status On-going    Target Date 11/15/21      PT LONG TERM GOAL #3   Title Patient will increase six minute walk test distance to >900 feet for progression to improve gait ability and safety with ambulation     Baseline 10/25: 650 feet with walker, 12/8: 825 with 3WW    Time 8    Period Weeks    Status On-going    Target Date 09/18/21      PT LONG TERM GOAL #4   Title Pt will safely demonstrate ability to navigate up and down 4 stairs without assistance in order to improve ability to navigate stairs in her daily life    Baseline able to ascend 1 stair but too reports fear of descending more than 1 stair, 12/8: not assessed    Time 6    Period Weeks    Status Not Met    Target Date 09/18/21                   Plan - 09/25/21 1020     Clinical Impression Statement Patient motivated and participated well within session. She reports increased stiffness this session. PT performed gentle stretches/ROM to facilitate better flexibility. Patient able to roll right sidelying with min A with good positioning. She does exhibit increased soreness and tightness along left piriformis, gluteal muscle, greater trochanter and IT band. PT performed soft tissue massage utilizing rolling stick for better flexibility. Patient tolerated manual therapy well reporting less tightness/stiffness at end of session. Patient able to exhibit better step length following stretches/manual therapy; Patient instructed in balance exercise, utilizing compliant surface. Patient continues to have high fear of falling with difficulty reducing rail assist. Patient would benefit from additional skilled PT Intervention to improve strength, balance and mobility while reducing pain;    Personal Factors and Comorbidities Age;Comorbidity 1    Examination-Activity Limitations Bend;Carry;Squat;Stairs    Examination-Participation Restrictions Cleaning;Laundry    Stability/Clinical Decision Making Evolving/Moderate complexity    Rehab Potential Good    PT Frequency 2x / week    PT Duration 6 weeks    PT Treatment/Interventions ADLs/Self Care Home Management;Aquatic Therapy;Electrical Stimulation;Gait training;Stair training;Functional  mobility training;Therapeutic activities;Therapeutic exercise;Balance training;Neuromuscular re-education;Patient/family education;Prosthetic Training;Manual techniques;Passive range of motion;Energy conservation    PT Next Visit Plan Contine stair training (rail on left ascending)    PT Home Exercise Plan No updates to HEP this date    Consulted and Agree with Plan of Care Patient             Patient will benefit from skilled therapeutic intervention in order to improve the following deficits and impairments:  Abnormal gait, Decreased balance, Decreased endurance, Decreased mobility, Decreased activity tolerance  Visit Diagnosis: Difficulty in walking, not elsewhere classified  Unsteadiness on feet  Muscle weakness (generalized)  Pain in left lower leg  Other lack of coordination  Other abnormalities of gait and mobility     Problem List Patient Active Problem List   Diagnosis Date Noted   Primary localized osteoarthritis of left hip 03/30/2019   Status post left hip replacement 03/30/2019    Nihira Puello, PT,  DPT 09/25/2021, 10:27 AM  [place] MAIN [facility] SERVICES [address] [place], [place], [address] Phone: [phone]   Fax:  [phone]  Name: AMARE KONTOS MRN: [id number] Date of Birth: Nov 30, 1945

## 2021-09-27 ENCOUNTER — Ambulatory Visit: Payer: Medicare Other | Admitting: Physical Therapy

## 2021-10-02 ENCOUNTER — Ambulatory Visit: Payer: Medicare Other | Admitting: Physical Therapy

## 2021-10-02 ENCOUNTER — Other Ambulatory Visit: Payer: Self-pay

## 2021-10-02 ENCOUNTER — Encounter: Payer: Self-pay | Admitting: Physical Therapy

## 2021-10-02 DIAGNOSIS — M6281 Muscle weakness (generalized): Secondary | ICD-10-CM | POA: Diagnosis not present

## 2021-10-02 DIAGNOSIS — R2689 Other abnormalities of gait and mobility: Secondary | ICD-10-CM

## 2021-10-02 DIAGNOSIS — R262 Difficulty in walking, not elsewhere classified: Secondary | ICD-10-CM

## 2021-10-02 DIAGNOSIS — Z9181 History of falling: Secondary | ICD-10-CM

## 2021-10-02 DIAGNOSIS — R269 Unspecified abnormalities of gait and mobility: Secondary | ICD-10-CM

## 2021-10-02 DIAGNOSIS — R278 Other lack of coordination: Secondary | ICD-10-CM

## 2021-10-02 DIAGNOSIS — M79662 Pain in left lower leg: Secondary | ICD-10-CM

## 2021-10-02 DIAGNOSIS — R2681 Unsteadiness on feet: Secondary | ICD-10-CM | POA: Diagnosis not present

## 2021-10-02 NOTE — Therapy (Signed)
[place] MAIN [facility] SERVICES [address] [place], [place], [address] Phone: [phone]   Fax:  [phone]  Physical Therapy Treatment Physical Therapy Progress Note   Dates of reporting period  08/07/21   to   10/02/21   Patient Details  Name: [name] MRN: [id number] Date of Birth: 1946-02-11 Referring Provider (PT): Merlene Pulling PA-C   Encounter Date: 10/02/2021   PT End of Session - 10/02/21 1503     Visit Number 30    Number of Visits 63    Date for PT Re-Evaluation 12/13/21    Authorization Time Period recert 68/3/41-06/19/21    Progress Note Due on Visit 54    PT Start Time 0938    PT Stop Time 1025    PT Time Calculation (min) 47 min    Equipment Utilized During Treatment Gait belt    Activity Tolerance Patient tolerated treatment well    Behavior During Therapy WFL for tasks assessed/performed             Past Medical History:  Diagnosis Date   Allergic rhinitis    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   Heart murmur    mild mitral regurg-echo 2010   Hyperlipidemia    Hypertension    Lupus anticoagulant positive    Obesity    Pre-diabetes    Primary localized osteoarthritis of left hip 03/30/2019   Stroke [facility]) 2010   Some right sided weakness.  Gait issues.   Wears dentures    partial upper    Past Surgical History:  Procedure Laterality Date   ABDOMINAL HYSTERECTOMY  1981   APPENDECTOMY     AXILLARY LYMPH NODE BIOPSY Left 05/16/2014   Procedure: EXCISION 2CM LEFT AXILLARY MASS;  Surgeon: [name], MD;  Location: [place];  Service: General;  Laterality: Left;  Left axillary sebaceous cyst excision   CATARACT EXTRACTION W/PHACO Left  04/11/2020   Procedure: CATARACT EXTRACTION PHACO AND INTRAOCULAR LENS PLACEMENT ([place]) LEFT DIABETIC TORIC LENS  6.05 00:34.9;  Surgeon: [name], MD;  Location: [place];  Service: Ophthalmology;  Laterality: Left;   CATARACT EXTRACTION W/PHACO Right 12/12/2020   Procedure: CATARACT EXTRACTION PHACO AND INTRAOCULAR LENS PLACEMENT ([place]) RIGHT TORIC LENS;  Surgeon: [name], MD;  Location: [place];  Service: Ophthalmology;  Laterality: Right;  6.55 0:41.1   DILATION AND CURETTAGE OF UTERUS     SKIN CANCER EXCISION  2016   [facility]   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   Procedure: TOTAL HIP ARTHROPLASTY;  Surgeon: [name], MD;  [facility];  Service: Orthopedics;  Laterality: Left;    There were no vitals filed for this visit.   Subjective Assessment - 10/02/21 1501     Subjective Patient reports continued soreness in LLE; She reports increased stiffness today;    Pertinent History Pt reports she had a stroke in 2010 and was in inpatient rehab for 4 weeks. Pt reports she has been to therapy about one time a year since the onset of her stroke for various ailments including L THA in 2020. Pt reports she has bioness L300 which improved her drop foot on her R ankle. Pt has depended on  her L LE since the stroke due to her weakness and impaired funciton on the right LE. Since the fall she has had throbbing pain in the left LE which is exacerbated with increased weightbearing activity. Pt reports she has had x rays and it shows nothing is broken but despite this she has had continued pain in the left. Pt reports pain is on the lateral aspect of her left LE up into her posterior thigh. Pt reports doctor thinks this could be from nerve related pain but will need PT in order for her insurance to cover MRI of the lumbar spine. Prior to this fall she fell and broke her right wrist.  Pt reports she can perform any activities above the level of the wast but she is  unable to bend over to complete tasks. Pt reports she has multiple wakers, 3 wheel, 4 wheel, wheelchair, standard rolling walker. Pt had brain MRI following fall and no reports of bleeding or issues.    Limitations Walking    How long can you sit comfortably? a couple hours due to low back pain    How long can you stand comfortably? 15 minutes    How long can you walk comfortably? Able to walk around [employer] but was very fatigued following    Currently in Pain? Yes    Pain Score 3     Pain Location Leg    Pain Orientation Left    Pain Descriptors / Indicators Aching;Throbbing    Pain Type Chronic pain    Pain Onset More than a month ago    Pain Frequency Constant    Aggravating Factors  prolonged standing/walking    Pain Relieving Factors PT intervention    Effect of Pain on Daily Activities decreased activity tolerance;    Multiple Pain Sites No                [facility] PT Assessment - 10/02/21 0001       Observation/Other Assessments   Focus on Therapeutic Outcomes (FOTO)  60% (slight improvement from 08/07/21 which was 58%)      Transfers   Five time sit to stand comments  14.06 sec with 1 UE , <15 sec indicates low risk for falls      6 minute walk test results    Aerobic Endurance Distance Walked 715    Endurance additional comments with 3 wheeled walker, supervision, uneven step length but reciprocal gait pattern;improved from 650 feet on 08/07/21                Therex:  Patient supine: PT performed passive hamstring stretch SLR 30 sec hold x1 rep each LE             Progressed to LLE passive hamstring stretch with ankle DF x10 reps for neural flossing PT performed passive hamstring stretch with hip flexed and knee flexion/extended (flossing) 5 sec hold x5 reps each PT performed passive single knee to chest stretch 20 sec hold x1 reps each LE; LLE piriformis stretch passive 30 sec hold x2 reps;   Patient exhibits moderate stiffness in LLE particularly in left  gluteal/piriformis. She was able to exhibit better ROM following soft tissue massage with repeated stretch;   Manual Therapy:   MFRE and STM to Glute med and min x 8 min utilizing rolling stick;  PT also performed soft tissue massage along proximal IT band on LLE x3 min;    Patient transitioned to left sidelying: PT performed passive RLE quad  stretch 30 sec hold x2 reps Patient reports moderate tightness in right quad with moderate tenderness to palpation;  Instructed patient in 6 min walk, 5 times sit<>Stand and other outcome measures to address goals; see above;      Pt educated throughout session about proper posture and technique with exercises. Improved exercise technique, movement at target joints, use of target muscles after min to mod verbal, visual, tactile cues.     Patient's condition has the potential to improve in response to therapy. Maximum improvement is yet to be obtained. The anticipated improvement is attainable and reasonable in a generally predictable time.  Patient reports adherence with HEP daily; She reports reduction in pain in LLE following introduction of soft tissue massage to left hip;                         PT Education - 10/02/21 1502     Education Details LE ROM/manual therapy, progress towards goals;    Person(s) Educated Patient    Methods Explanation;Verbal cues    Comprehension Verbalized understanding;Returned demonstration;Verbal cues required;Need further instruction              PT Short Term Goals - 10/02/21 1027       PT SHORT TERM GOAL #1   Title Patient will be independent in home exercise program to improve strength/mobility for better functional independence with ADLs.    Baseline Met    Time 3    Period Days    Status Achieved      PT SHORT TERM GOAL #2   Title Pt will be able to complete 6 MWT without requiring rest break or sitting in order to indicate ipmroved aerobic and muscular endurance for everyday  tasks.    Baseline 835 feet with 3 wheel walker at PN 9/8    Time 4    Period Weeks    Status Achieved      PT SHORT TERM GOAL #3   Title Patient will complete five times sit to stand test in < 25 seconds indicating an increased LE strength and improved balance.    Baseline 16 sec 9/8 with UE use    Time 4    Period Weeks    Status Achieved    Target Date 06/18/21               PT Long Term Goals - 10/02/21 1027       PT LONG TERM GOAL #1   Title Pt will improve FOTO score to 60 in order to indicate improvement with everyday tasks    Baseline FOTO: 58 om 9/8 (met initial foto goal), 1025: 58% 12/8: 57%, 12/20: 60%    Time 12    Period Weeks    Status Achieved    Target Date 12/13/21      PT LONG TERM GOAL #2   Title Patient will complete five times sit to stand test in < 13 seconds indicating an increased LE strength and improved balance.    Baseline 5XSTS in 16 sec with UE use 06/21/21, 10/25: 18 sec with UE use, 15.1 sec on 12/8, 12/19: 14 sec with 1 UE assist;    Time 8    Period Weeks    Status Partially Met    Target Date 11/15/21      PT LONG TERM GOAL #3   Title Patient will increase six minute walk test distance to >900 feet for progression to improve gait  ability and safety with ambulation    Baseline 10/25: 650 feet with walker, 12/8: 825 with 3WW, 12/19: 715 feet with 3 wheeled walker    Time 8    Period Weeks    Status On-going    Target Date 09/18/21      PT LONG TERM GOAL #4   Title Pt will safely demonstrate ability to navigate up and down 4 stairs without assistance in order to improve ability to navigate stairs in her daily life    Baseline able to ascend 1 stair but too reports fear of descending more than 1 stair, 12/8: not assessed, 12/20: not assessed;    Time 6    Period Weeks    Status Not Met    Target Date 09/18/21                   Plan - 10/02/21 1503     Clinical Impression Statement Patient motivated and participated well  within session. She is still having intermittent LLE pain and stiffness which is alleviated with LE stretches and manual therapy; PT instructed patient in outcome measures to address goals. She has made improvement in mobility since October 2022, although does exhibit slight decline from early December. She continues to exhibit better sit<>Stand ability as evidenced with 5 times sit<>Stand with less UE use and better speed. Patient continues to have high fear of falling which limits mobility. Discussed transitioning to work more on balance and standing mobility tasks to reduce fear of falling and improve overall mobility and improve quality of life. Patient agreeable.    Personal Factors and Comorbidities Age;Comorbidity 1    Examination-Activity Limitations Bend;Carry;Squat;Stairs    Examination-Participation Restrictions Cleaning;Laundry    Stability/Clinical Decision Making Evolving/Moderate complexity    Rehab Potential Good    PT Frequency 2x / week    PT Duration 6 weeks    PT Treatment/Interventions ADLs/Self Care Home Management;Aquatic Therapy;Electrical Stimulation;Gait training;Stair training;Functional mobility training;Therapeutic activities;Therapeutic exercise;Balance training;Neuromuscular re-education;Patient/family education;Prosthetic Training;Manual techniques;Passive range of motion;Energy conservation    PT Next Visit Plan Contine stair training (rail on left ascending)    PT Home Exercise Plan No updates to HEP this date    Consulted and Agree with Plan of Care Patient             Patient will benefit from skilled therapeutic intervention in order to improve the following deficits and impairments:  Abnormal gait, Decreased balance, Decreased endurance, Decreased mobility, Decreased activity tolerance  Visit Diagnosis: Difficulty in walking, not elsewhere classified  Unsteadiness on feet  Muscle weakness (generalized)  Pain in left lower leg  Other lack of  coordination  Other abnormalities of gait and mobility  Abnormality of gait and mobility  History of falling     Problem List Patient Active Problem List   Diagnosis Date Noted   Primary localized osteoarthritis of left hip 03/30/2019   Status post left hip replacement 03/30/2019    Reneisha Stilley, PT, DPT 10/02/2021, 3:08 PM  [place] [facility] MAIN [facility] SERVICES [address] [place], [place], [address] Phone: [phone]   Fax:  [phone]  Name: IRISH BREISCH MRN: [id number] Date of Birth: 24-Feb-1946

## 2021-10-04 ENCOUNTER — Ambulatory Visit: Payer: Medicare Other | Admitting: Physical Therapy

## 2021-10-09 ENCOUNTER — Ambulatory Visit: Payer: Medicare Other | Admitting: Physical Therapy

## 2021-10-09 ENCOUNTER — Other Ambulatory Visit: Payer: Self-pay

## 2021-10-09 DIAGNOSIS — R269 Unspecified abnormalities of gait and mobility: Secondary | ICD-10-CM

## 2021-10-09 DIAGNOSIS — M79662 Pain in left lower leg: Secondary | ICD-10-CM | POA: Diagnosis not present

## 2021-10-09 DIAGNOSIS — R262 Difficulty in walking, not elsewhere classified: Secondary | ICD-10-CM

## 2021-10-09 DIAGNOSIS — R278 Other lack of coordination: Secondary | ICD-10-CM | POA: Diagnosis not present

## 2021-10-09 DIAGNOSIS — R2689 Other abnormalities of gait and mobility: Secondary | ICD-10-CM | POA: Diagnosis not present

## 2021-10-09 DIAGNOSIS — M6281 Muscle weakness (generalized): Secondary | ICD-10-CM | POA: Diagnosis not present

## 2021-10-09 DIAGNOSIS — R2681 Unsteadiness on feet: Secondary | ICD-10-CM

## 2021-10-09 NOTE — Therapy (Signed)
[place] MAIN [facility] SERVICES [address] [place], [place], [address] Phone: [phone]   Fax:  [phone]  Physical Therapy Treatment  Patient Details  Name: [name] MRN: [id number] Date of Birth: 06-11-1946 Referring Provider (PT): Merlene Pulling PA-C   Encounter Date: 10/09/2021   PT End of Session - 10/09/21 1810     Visit Number 31    Number of Visits 45    Date for PT Re-Evaluation 12/13/21    Authorization Time Period recert 85/9/09-12/13/10    Progress Note Due on Visit 41    PT Start Time 1148    PT Stop Time 1230    PT Time Calculation (min) 42 min    Equipment Utilized During Treatment Gait belt    Activity Tolerance Patient tolerated treatment well    Behavior During Therapy WFL for tasks assessed/performed             Past Medical History:  Diagnosis Date   Allergic rhinitis    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   Heart murmur    mild mitral regurg-echo 2010   Hyperlipidemia    Hypertension    Lupus anticoagulant positive    Obesity    Pre-diabetes    Primary localized osteoarthritis of left hip 03/30/2019   Stroke [facility]) 2010   Some right sided weakness.  Gait issues.   Wears dentures    partial upper    Past Surgical History:  Procedure Laterality Date   ABDOMINAL HYSTERECTOMY  1981   APPENDECTOMY     AXILLARY LYMPH NODE BIOPSY Left 05/16/2014   Procedure: EXCISION 2CM LEFT AXILLARY MASS;  Surgeon: [name], MD;  Location: [place];  Service: General;  Laterality: Left;  Left axillary sebaceous cyst excision   CATARACT EXTRACTION W/PHACO Left 04/11/2020   Procedure: CATARACT EXTRACTION PHACO AND INTRAOCULAR LENS PLACEMENT ([place]) LEFT  DIABETIC TORIC LENS  6.05 00:34.9;  Surgeon: [name], MD;  Location: [place];  Service: Ophthalmology;  Laterality: Left;   CATARACT EXTRACTION W/PHACO Right 12/12/2020   Procedure: CATARACT EXTRACTION PHACO AND INTRAOCULAR LENS PLACEMENT ([place]) RIGHT TORIC LENS;  Surgeon: [name], MD;  Location: [place];  Service: Ophthalmology;  Laterality: Right;  6.55 0:41.1   DILATION AND CURETTAGE OF UTERUS     SKIN CANCER EXCISION  2016   [facility]   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   Procedure: TOTAL HIP ARTHROPLASTY;  Surgeon: [name], MD;  [facility];  Service: Orthopedics;  Laterality: Left;    There were no vitals filed for this visit.   Subjective Assessment - 10/09/21 1214     Subjective Patient reports iproved soreness in the L LE. No limiting pain in the leg since last visit but some pain still present.    Pertinent History Pt reports she had a stroke in 2010 and was in inpatient rehab for 4 weeks. Pt reports she has been to therapy about one time a year since the onset of her stroke for various ailments including L THA in 2020. Pt reports she has bioness L300 which improved her drop foot on her R ankle. Pt has depended on her L LE since the stroke due to  her weakness and impaired funciton on the right LE. Since the fall she has had throbbing pain in the left LE which is exacerbated with increased weightbearing activity. Pt reports she has had x rays and it shows nothing is broken but despite this she has had continued pain in the left. Pt reports pain is on the lateral aspect of her left LE up into her posterior thigh. Pt reports doctor thinks this could be from nerve related pain but will need PT in order for her insurance to cover MRI of the lumbar spine. Prior to this fall she fell and broke her right wrist.  Pt reports she can perform any activities above the level of the wast but she is unable to bend over to complete tasks. Pt reports she  has multiple wakers, 3 wheel, 4 wheel, wheelchair, standard rolling walker. Pt had brain MRI following fall and no reports of bleeding or issues.    Limitations Walking    How long can you sit comfortably? a couple hours due to low back pain    How long can you stand comfortably? 15 minutes    How long can you walk comfortably? Able to walk around [employer] but was very fatigued following    Pain Onset More than a month ago             reatment provided this session Patient Mod I to transition to supine position:  Supine with moist heat to low back concurrent with exercise for comfort;    Manual Therapy :   Patient supine:  PT performed passive left piriformis stretch 2 x 45 sec  PT performed passive SLR hamstring stretch 30 sec hold x2 reps each LE,             Progressed to neural flossing with ankle DF/PF slow ROM x15 reps LLE only      Supervision and set up  for rolling to right sidelying with pillows under UE/LE;      Manual therapy: performed with pt in right sidelying position   Soft tissue massage to left piriformis/posterior hip, and greater trochanter along IT band x15  min, utilized rolling stick and therapist hands with cross friction and myofascial release technique; Ischemic trigger point release to left piriformis 30 sec hold x2 reps    Supine clamshells 2 x 15 x 5 sec hold GTB -therex     Required Supervision to transition to sitting position;       Patient instructed in balance exercise:neuro re-ed Standing on airex pad: Feet apart:             Unsupported standing 30 sec hold, min A with increased instability noted with prolonged standing  Progressed to side/side weight shift with 1-0 rail assist x10 reps with cues for increased hip movement and reduce upper trunk lean, initially patient unable to achieve lateral weight shift in LE but was just moving shoulders; with increased repetition she was able to isolate lateral weight shift in LE but has increased  difficulty with reduce rail assist;   BLE heel raises moderate difficulty 2x15 reps total - therex    She does report increased LLE discomfort with prolonged standing of 2/10;       Pt educated throughout session about proper posture and technique with exercises. Improved exercise technique, movement at target joints, use of target muscles after min to mod verbal, visual, tactile cues.  PT Short Term Goals - 10/02/21 1027       PT SHORT TERM GOAL #1   Title Patient will be independent in home exercise program to improve strength/mobility for better functional independence with ADLs.    Baseline Met    Time 3    Period Days    Status Achieved      PT SHORT TERM GOAL #2   Title Pt will be able to complete 6 MWT without requiring rest break or sitting in order to indicate ipmroved aerobic and muscular endurance for everyday tasks.    Baseline 835 feet with 3 wheel walker at PN 9/8    Time 4    Period Weeks    Status Achieved      PT SHORT TERM GOAL #3   Title Patient will complete five times sit to stand test in < 25 seconds indicating an increased LE strength and improved balance.    Baseline 16 sec 9/8 with UE use    Time 4    Period Weeks    Status Achieved    Target Date 06/18/21               PT Long Term Goals - 10/02/21 1027       PT LONG TERM GOAL #1   Title Pt will improve FOTO score to 60 in order to indicate improvement with everyday tasks    Baseline FOTO: 58 om 9/8 (met initial foto goal), 1025: 58% 12/8: 57%, 12/20: 60%    Time 12    Period Weeks    Status Achieved    Target Date 12/13/21      PT LONG TERM GOAL #2   Title Patient will complete five times sit to stand test in < 13 seconds indicating an increased LE strength and improved balance.    Baseline 5XSTS in 16 sec with UE use 06/21/21, 10/25: 18 sec with UE use, 15.1 sec on 12/8, 12/19: 14 sec with 1 UE assist;    Time 8    Period  Weeks    Status Partially Met    Target Date 11/15/21      PT LONG TERM GOAL #3   Title Patient will increase six minute walk test distance to >900 feet for progression to improve gait ability and safety with ambulation    Baseline 10/25: 650 feet with walker, 12/8: 825 with 3WW, 12/19: 715 feet with 3 wheeled walker    Time 8    Period Weeks    Status On-going    Target Date 09/18/21      PT LONG TERM GOAL #4   Title Pt will safely demonstrate ability to navigate up and down 4 stairs without assistance in order to improve ability to navigate stairs in her daily life    Baseline able to ascend 1 stair but too reports fear of descending more than 1 stair, 12/8: not assessed, 12/20: not assessed;    Time 6    Period Weeks    Status Not Met    Target Date 09/18/21                   Plan - 10/09/21 1810     Clinical Impression Statement Patient participated well in physical therapy sessions and states.  Patient has improved left lower extremity pain and reports between now and her previous visit and this visit although she had minimal amounts of pain and the pain was not limiting to her activity.  Patient also notes some improvement in her lower extremity strength and reports improvement in her ability to navigate stairs.  We will continue to progress to working on more balance and standing mobility to reduce her fear of falling and improve her overall mobility and quality of life.    Personal Factors and Comorbidities Age;Comorbidity 1    Examination-Activity Limitations Bend;Carry;Squat;Stairs    Examination-Participation Restrictions Cleaning;Laundry    Stability/Clinical Decision Making Evolving/Moderate complexity    Rehab Potential Good    PT Frequency 2x / week    PT Duration 6 weeks    PT Treatment/Interventions ADLs/Self Care Home Management;Aquatic Therapy;Electrical Stimulation;Gait training;Stair training;Functional mobility training;Therapeutic activities;Therapeutic  exercise;Balance training;Neuromuscular re-education;Patient/family education;Prosthetic Training;Manual techniques;Passive range of motion;Energy conservation    PT Next Visit Plan Contine stair training (rail on left ascending)    PT Home Exercise Plan No updates to HEP this date    Consulted and Agree with Plan of Care Patient             Patient will benefit from skilled therapeutic intervention in order to improve the following deficits and impairments:  Abnormal gait, Decreased balance, Decreased endurance, Decreased mobility, Decreased activity tolerance  Visit Diagnosis: Pain in left lower leg  Difficulty in walking, not elsewhere classified  Unsteadiness on feet  Abnormality of gait and mobility     Problem List Patient Active Problem List   Diagnosis Date Noted   Primary localized osteoarthritis of left hip 03/30/2019   Status post left hip replacement 03/30/2019    Particia Lather, PT 10/09/2021, 6:18 PM  [place] [address] [place], [place], [address] Phone: [phone]   Fax:  [phone]  Name: CLEASTER SHIFFER MRN: [id number] Date of Birth: 01-Jun-1946

## 2021-10-11 ENCOUNTER — Other Ambulatory Visit: Payer: Self-pay

## 2021-10-11 ENCOUNTER — Ambulatory Visit: Payer: Medicare Other | Admitting: Physical Therapy

## 2021-10-11 DIAGNOSIS — R262 Difficulty in walking, not elsewhere classified: Secondary | ICD-10-CM

## 2021-10-11 DIAGNOSIS — R2681 Unsteadiness on feet: Secondary | ICD-10-CM

## 2021-10-11 DIAGNOSIS — M79662 Pain in left lower leg: Secondary | ICD-10-CM | POA: Diagnosis not present

## 2021-10-11 DIAGNOSIS — M6281 Muscle weakness (generalized): Secondary | ICD-10-CM | POA: Diagnosis not present

## 2021-10-11 DIAGNOSIS — R278 Other lack of coordination: Secondary | ICD-10-CM | POA: Diagnosis not present

## 2021-10-11 DIAGNOSIS — R269 Unspecified abnormalities of gait and mobility: Secondary | ICD-10-CM

## 2021-10-11 DIAGNOSIS — R2689 Other abnormalities of gait and mobility: Secondary | ICD-10-CM | POA: Diagnosis not present

## 2021-10-11 NOTE — Therapy (Signed)
[place] MAIN [facility] SERVICES [address] [place], [place], [address] Phone: [phone]   Fax:  [phone]  Physical Therapy Treatment  Patient Details  Name: [name] MRN: [id number] Date of Birth: Jul 12, 1946 Referring Provider (PT): Merlene Pulling PA-C   Encounter Date: 10/11/2021   PT End of Session - 10/11/21 1404     Visit Number 32    Number of Visits 76    Date for PT Re-Evaluation 12/13/21    Authorization Time Period recert 51/7/00-10/21/47    Progress Note Due on Visit 65    PT Start Time 1018    PT Stop Time 1100    PT Time Calculation (min) 42 min    Equipment Utilized During Treatment Gait belt    Activity Tolerance Patient tolerated treatment well    Behavior During Therapy WFL for tasks assessed/performed             Past Medical History:  Diagnosis Date   Allergic rhinitis    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   Heart murmur    mild mitral regurg-echo 2010   Hyperlipidemia    Hypertension    Lupus anticoagulant positive    Obesity    Pre-diabetes    Primary localized osteoarthritis of left hip 03/30/2019   Stroke [facility]) 2010   Some right sided weakness.  Gait issues.   Wears dentures    partial upper    Past Surgical History:  Procedure Laterality Date   ABDOMINAL HYSTERECTOMY  1981   APPENDECTOMY     AXILLARY LYMPH NODE BIOPSY Left 05/16/2014   Procedure: EXCISION 2CM LEFT AXILLARY MASS;  Surgeon: [name], MD;  Location: [place];  Service: General;  Laterality: Left;  Left axillary sebaceous cyst excision   CATARACT EXTRACTION W/PHACO Left 04/11/2020   Procedure: CATARACT EXTRACTION PHACO AND INTRAOCULAR LENS PLACEMENT ([place]) LEFT  DIABETIC TORIC LENS  6.05 00:34.9;  Surgeon: [name], MD;  Location: [place];  Service: Ophthalmology;  Laterality: Left;   CATARACT EXTRACTION W/PHACO Right 12/12/2020   Procedure: CATARACT EXTRACTION PHACO AND INTRAOCULAR LENS PLACEMENT ([place]) RIGHT TORIC LENS;  Surgeon: [name], MD;  Location: [place];  Service: Ophthalmology;  Laterality: Right;  6.55 0:41.1   DILATION AND CURETTAGE OF UTERUS     SKIN CANCER EXCISION  2016   [facility]   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   Procedure: TOTAL HIP ARTHROPLASTY;  Surgeon: [name], MD;  [facility];  Service: Orthopedics;  Laterality: Left;    There were no vitals filed for this visit.   Subjective Assessment - 10/11/21 1402     Subjective Patient reports increase in pain/ discomfort yesterday and beleives it was from an overall increase in activity. No other changes since last session.    Pertinent History Pt reports she had a stroke in 2010 and was in inpatient rehab for 4 weeks. Pt reports she has been to therapy about one time a year since the onset of her stroke for various ailments including L THA in 2020. Pt reports she has bioness L300 which improved her drop foot on her R ankle. Pt has depended on her L LE since the stroke due  to her weakness and impaired funciton on the right LE. Since the fall she has had throbbing pain in the left LE which is exacerbated with increased weightbearing activity. Pt reports she has had x rays and it shows nothing is broken but despite this she has had continued pain in the left. Pt reports pain is on the lateral aspect of her left LE up into her posterior thigh. Pt reports doctor thinks this could be from nerve related pain but will need PT in order for her insurance to cover MRI of the lumbar spine. Prior to this fall she fell and broke her right wrist.  Pt reports she can perform any activities above the level of the wast but she is unable to bend over to  complete tasks. Pt reports she has multiple wakers, 3 wheel, 4 wheel, wheelchair, standard rolling walker. Pt had brain MRI following fall and no reports of bleeding or issues.    Limitations Walking    How long can you sit comfortably? a couple hours due to low back pain    How long can you stand comfortably? 15 minutes    How long can you walk comfortably? Able to walk around [employer] but was very fatigued following    Currently in Pain? Yes    Pain Score 4     Pain Location Leg    Pain Orientation Left    Pain Descriptors / Indicators Aching;Throbbing    Pain Type Chronic pain    Pain Onset More than a month ago            reatment provided this session Patient  transition to supine position:   Manual Therapy :   Patient supine:  PT performed passive left piriformis stretch 2 x 45 sec  PT performed passive SLR hamstring stretch 30 sec hold x2 reps each LE,            Cued pt to perform neural flossing with ankle DF/PF slow ROM x15 reps LLE only      Supervision and set up  for rolling to right sidelying with pillows under UE/LE;      Manual therapy: performed with pt in right sidelying position   Soft tissue massage to left piriformis/posterior hip, and greater trochanter along IT band x15  min, utilized rolling stick and therapist hands with cross friction and myofascial release technique; Ischemic trigger point release to left piriformis 30 sec hold x2 reps    transitioned to supine and PT performed piriformis stretch and    Required Supervision to transition to sitting position;       Patient instructed in balance exercise:neuro re-ed Standing on airex pad: Feet apart:             Unsupported standing 30 sec hold, min A with increased instability noted with prolonged standing  Progressed to side/side weight shift with 1-0 rail assist x10 reps with cues for increased hip movement and reduce upper trunk lean, initially patient unable to achieve lateral weight shift  in LE but was just moving shoulders; with increased repetition she was able to isolate lateral weight shift in LE but has increased difficulty with reduce rail assist;   Side stepping x 2 laps in // bars , cues for slow eccentric control but continued difficulty despite cues   L LE abduction in standing, cues for slow eccentric control but pt unable to maintain 2 x 10   BLE heel raises moderate difficulty 2x15 reps total - therex  She does report increased LLE discomfort with prolonged standing of 2/10;       Pt educated throughout session about proper posture and technique with exercises. Improved exercise technique, movement at target joints, use of target muscles after min to mod verbal, visual, tactile cues.                                PT Education - 10/11/21 1404     Education Details Exercise form and technique              PT Short Term Goals - 10/02/21 1027       PT SHORT TERM GOAL #1   Title Patient will be independent in home exercise program to improve strength/mobility for better functional independence with ADLs.    Baseline Met    Time 3    Period Days    Status Achieved      PT SHORT TERM GOAL #2   Title Pt will be able to complete 6 MWT without requiring rest break or sitting in order to indicate ipmroved aerobic and muscular endurance for everyday tasks.    Baseline 835 feet with 3 wheel walker at PN 9/8    Time 4    Period Weeks    Status Achieved      PT SHORT TERM GOAL #3   Title Patient will complete five times sit to stand test in < 25 seconds indicating an increased LE strength and improved balance.    Baseline 16 sec 9/8 with UE use    Time 4    Period Weeks    Status Achieved    Target Date 06/18/21               PT Long Term Goals - 10/02/21 1027       PT LONG TERM GOAL #1   Title Pt will improve FOTO score to 60 in order to indicate improvement with everyday tasks    Baseline FOTO: 58 om 9/8 (met  initial foto goal), 1025: 58% 12/8: 57%, 12/20: 60%    Time 12    Period Weeks    Status Achieved    Target Date 12/13/21      PT LONG TERM GOAL #2   Title Patient will complete five times sit to stand test in < 13 seconds indicating an increased LE strength and improved balance.    Baseline 5XSTS in 16 sec with UE use 06/21/21, 10/25: 18 sec with UE use, 15.1 sec on 12/8, 12/19: 14 sec with 1 UE assist;    Time 8    Period Weeks    Status Partially Met    Target Date 11/15/21      PT LONG TERM GOAL #3   Title Patient will increase six minute walk test distance to >900 feet for progression to improve gait ability and safety with ambulation    Baseline 10/25: 650 feet with walker, 12/8: 825 with 3WW, 12/19: 715 feet with 3 wheeled walker    Time 8    Period Weeks    Status On-going    Target Date 09/18/21      PT LONG TERM GOAL #4   Title Pt will safely demonstrate ability to navigate up and down 4 stairs without assistance in order to improve ability to navigate stairs in her daily life    Baseline able to ascend 1 stair but too reports fear of descending  more than 1 stair, 12/8: not assessed, 12/20: not assessed;    Time 6    Period Weeks    Status Not Met    Target Date 09/18/21                   Plan - 10/11/21 1405     Clinical Impression Statement Patient participated well in physical therapy sessions and states continued improvement in LE pain on the left. Pt able to tolerate increased amounts of therapeutic exercise this session without c/o increased pain. Pt also demonstrated improved ability to maintain standing position without pain this session. We will continue to progress to working on more balance and standing mobility to reduce her fear of falling and improve her overall mobility and quality of life.    Personal Factors and Comorbidities Age;Comorbidity 1    Examination-Activity Limitations Bend;Carry;Squat;Stairs    Examination-Participation Restrictions  Cleaning;Laundry    Stability/Clinical Decision Making Evolving/Moderate complexity    Rehab Potential Good    PT Frequency 2x / week    PT Duration 6 weeks    PT Treatment/Interventions ADLs/Self Care Home Management;Aquatic Therapy;Electrical Stimulation;Gait training;Stair training;Functional mobility training;Therapeutic activities;Therapeutic exercise;Balance training;Neuromuscular re-education;Patient/family education;Prosthetic Training;Manual techniques;Passive range of motion;Energy conservation    PT Next Visit Plan Contine stair training (rail on left ascending)    PT Home Exercise Plan No updates to HEP this date    Consulted and Agree with Plan of Care Patient             Patient will benefit from skilled therapeutic intervention in order to improve the following deficits and impairments:  Abnormal gait, Decreased balance, Decreased endurance, Decreased mobility, Decreased activity tolerance  Visit Diagnosis: Pain in left lower leg  Difficulty in walking, not elsewhere classified  Unsteadiness on feet  Abnormality of gait and mobility     Problem List Patient Active Problem List   Diagnosis Date Noted   Primary localized osteoarthritis of left hip 03/30/2019   Status post left hip replacement 03/30/2019    Particia Lather, PT 10/11/2021, 2:12 PM  [place] [address] [place], [place], [address] Phone: [phone]   Fax:  [phone]  Name: [name] MRN: [id number] Date of Birth: June 15, 1946

## 2021-10-16 ENCOUNTER — Other Ambulatory Visit: Payer: Self-pay

## 2021-10-16 ENCOUNTER — Ambulatory Visit: Payer: Medicare Other | Attending: Surgical | Admitting: Physical Therapy

## 2021-10-16 DIAGNOSIS — Z9181 History of falling: Secondary | ICD-10-CM | POA: Diagnosis not present

## 2021-10-16 DIAGNOSIS — R262 Difficulty in walking, not elsewhere classified: Secondary | ICD-10-CM | POA: Diagnosis not present

## 2021-10-16 DIAGNOSIS — R2681 Unsteadiness on feet: Secondary | ICD-10-CM | POA: Insufficient documentation

## 2021-10-16 DIAGNOSIS — R2689 Other abnormalities of gait and mobility: Secondary | ICD-10-CM | POA: Insufficient documentation

## 2021-10-16 DIAGNOSIS — M79662 Pain in left lower leg: Secondary | ICD-10-CM | POA: Diagnosis not present

## 2021-10-16 DIAGNOSIS — M6281 Muscle weakness (generalized): Secondary | ICD-10-CM | POA: Insufficient documentation

## 2021-10-16 DIAGNOSIS — R269 Unspecified abnormalities of gait and mobility: Secondary | ICD-10-CM | POA: Insufficient documentation

## 2021-10-16 DIAGNOSIS — R278 Other lack of coordination: Secondary | ICD-10-CM | POA: Diagnosis not present

## 2021-10-16 NOTE — Therapy (Signed)
[place] MAIN [facility] SERVICES [address] [place], [place], [address] Phone: [phone]   Fax:  [phone]  Physical Therapy Treatment  Patient Details  Name: [name] MRN: [id number] Date of Birth: May 10, 1946 Referring Provider (PT): Merlene Pulling PA-C   Encounter Date: 10/16/2021   PT End of Session - 10/16/21 1018     Visit Number 33    Number of Visits 67    Date for PT Re-Evaluation 12/13/21    Authorization Time Period recert 85/8/85-0/2/77    Progress Note Due on Visit 93    PT Start Time 0933    PT Stop Time 1015    PT Time Calculation (min) 42 min    Equipment Utilized During Treatment Gait belt    Activity Tolerance Patient tolerated treatment well    Behavior During Therapy WFL for tasks assessed/performed             Past Medical History:  Diagnosis Date   Allergic rhinitis    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   Heart murmur    mild mitral regurg-echo 2010   Hyperlipidemia    Hypertension    Lupus anticoagulant positive    Obesity    Pre-diabetes    Primary localized osteoarthritis of left hip 03/30/2019   Stroke [facility]) 2010   Some right sided weakness.  Gait issues.   Wears dentures    partial upper    Past Surgical History:  Procedure Laterality Date   ABDOMINAL HYSTERECTOMY  1981   APPENDECTOMY     AXILLARY LYMPH NODE BIOPSY Left 05/16/2014   Procedure: EXCISION 2CM LEFT AXILLARY MASS;  Surgeon: [name], MD;  Location: [place];  Service: General;  Laterality: Left;  Left axillary sebaceous cyst excision   CATARACT EXTRACTION W/PHACO Left 04/11/2020   Procedure: CATARACT EXTRACTION PHACO AND INTRAOCULAR LENS PLACEMENT ([place]) LEFT DIABETIC  TORIC LENS  6.05 00:34.9;  Surgeon: [name], MD;  Location: [name];  Service: Ophthalmology;  Laterality: Left;   CATARACT EXTRACTION W/PHACO Right 12/12/2020   Procedure: CATARACT EXTRACTION PHACO AND INTRAOCULAR LENS PLACEMENT ([place]) RIGHT TORIC LENS;  Surgeon: [name], MD;  Location: [place];  Service: Ophthalmology;  Laterality: Right;  6.55 0:41.1   DILATION AND CURETTAGE OF UTERUS     SKIN CANCER EXCISION  2016   [facility]   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   Procedure: TOTAL HIP ARTHROPLASTY;  Surgeon: [name], MD;  [facility];  Service: Orthopedics;  Laterality: Left;    There were no vitals filed for this visit.   Subjective Assessment - 10/16/21 1004     Subjective Patient reports incrase in fatigue and soreness this date. Reports soreness is likely from recent increase in activity when performing cooking related activities for the new year holiday.    Pertinent History Pt reports she had a stroke in 2010 and was in inpatient rehab for 4 weeks. Pt reports she has been to therapy about one time a year since the onset of her stroke for various ailments including L THA in 2020. Pt reports she has bioness L300 which improved her drop foot on her R ankle. Pt has depended on her L  LE since the stroke due to her weakness and impaired funciton on the right LE. Since the fall she has had throbbing pain in the left LE which is exacerbated with increased weightbearing activity. Pt reports she has had x rays and it shows nothing is broken but despite this she has had continued pain in the left. Pt reports pain is on the lateral aspect of her left LE up into her posterior thigh. Pt reports doctor thinks this could be from nerve related pain but will need PT in order for her insurance to cover MRI of the lumbar spine. Prior to this fall she fell and broke her right wrist.  Pt reports she can perform any activities above the level of the wast but she  is unable to bend over to complete tasks. Pt reports she has multiple wakers, 3 wheel, 4 wheel, wheelchair, standard rolling walker. Pt had brain MRI following fall and no reports of bleeding or issues.    Limitations Walking    How long can you sit comfortably? a couple hours due to low back pain    How long can you stand comfortably? 15 minutes    How long can you walk comfortably? Able to walk around [employer] but was very fatigued following    Currently in Pain? Yes    Pain Score 5     Pain Location Leg    Pain Orientation Left    Pain Descriptors / Indicators Aching    Pain Type Chronic pain    Pain Onset More than a month ago              reatment provided this session Patient  transition to supine position:   Manual Therapy :   Patient supine:  PT performed passive left piriformis stretch 2 x 45 sec  PT performed passive SLR hamstring stretch 30 sec hold x2 reps each LE,            Cued pt to perform neural flossing with ankle DF/PF slow ROM x15 reps LLE only      Supervision and set up  for rolling to right sidelying with pillows under UE/LE;      Manual therapy: performed with pt in right sidelying position   Soft tissue massage to left piriformis/posterior hip, and greater trochanter along IT band x15  min, utilized rolling stick and therapist hands with cross friction and myofascial release technique; Ischemic trigger point release to left piriformis 30 sec hold x2 reps    transitioned to supine and PT performed piriformis stretch  SKTC stretch 2 x 45 sec ea (Manually)   Required Supervision to transition to sitting position;    Therex: Supine: Bridges 2 x 10  Clamshells GTB 2 x 10  Hip ABD 2 x 10   Transition from supine to R SL x 5  -improved efficacy compared to initial transition earlier in session.    Treatment provided this session      Pt educated throughout session about proper posture and technique with exercises. Improved exercise  technique, movement at target joints, use of target muscles after min to mod verbal, visual, tactile cues.                         PT Education - 10/16/21 1007     Education Details exercise form and technique    Person(s) Educated Patient    Methods Explanation;Verbal cues    Comprehension Verbalized understanding;Returned demonstration;Verbal cues required;Tactile  cues required              PT Short Term Goals - 10/02/21 1027       PT SHORT TERM GOAL #1   Title Patient will be independent in home exercise program to improve strength/mobility for better functional independence with ADLs.    Baseline Met    Time 3    Period Days    Status Achieved      PT SHORT TERM GOAL #2   Title Pt will be able to complete 6 MWT without requiring rest break or sitting in order to indicate ipmroved aerobic and muscular endurance for everyday tasks.    Baseline 835 feet with 3 wheel walker at PN 9/8    Time 4    Period Weeks    Status Achieved      PT SHORT TERM GOAL #3   Title Patient will complete five times sit to stand test in < 25 seconds indicating an increased LE strength and improved balance.    Baseline 16 sec 9/8 with UE use    Time 4    Period Weeks    Status Achieved    Target Date 06/18/21               PT Long Term Goals - 10/02/21 1027       PT LONG TERM GOAL #1   Title Pt will improve FOTO score to 60 in order to indicate improvement with everyday tasks    Baseline FOTO: 58 om 9/8 (met initial foto goal), 1025: 58% 12/8: 57%, 12/20: 60%    Time 12    Period Weeks    Status Achieved    Target Date 12/13/21      PT LONG TERM GOAL #2   Title Patient will complete five times sit to stand test in < 13 seconds indicating an increased LE strength and improved balance.    Baseline 5XSTS in 16 sec with UE use 06/21/21, 10/25: 18 sec with UE use, 15.1 sec on 12/8, 12/19: 14 sec with 1 UE assist;    Time 8    Period Weeks    Status Partially Met     Target Date 11/15/21      PT LONG TERM GOAL #3   Title Patient will increase six minute walk test distance to >900 feet for progression to improve gait ability and safety with ambulation    Baseline 10/25: 650 feet with walker, 12/8: 825 with 3WW, 12/19: 715 feet with 3 wheeled walker    Time 8    Period Weeks    Status On-going    Target Date 09/18/21      PT LONG TERM GOAL #4   Title Pt will safely demonstrate ability to navigate up and down 4 stairs without assistance in order to improve ability to navigate stairs in her daily life    Baseline able to ascend 1 stair but too reports fear of descending more than 1 stair, 12/8: not assessed, 12/20: not assessed;    Time 6    Period Weeks    Status Not Met    Target Date 09/18/21                   Plan - 10/16/21 1018     Clinical Impression Statement Patient participated well in physical therapy sessions and states continued improvement in LE pain on the left. Pt progressed with several supine exercises targetting muscles addressed with manual therapy. Pt  tolerated these exercises well. Pt also demonstrates imprpved ability to transition from supine to R SL with performing this activity for repetititions today. Will continue to progress to working on more balance and standing mobility to reduce her fear of falling and improve her overall mobility and quality of life.    Personal Factors and Comorbidities Age;Comorbidity 1    Examination-Activity Limitations Bend;Carry;Squat;Stairs    Examination-Participation Restrictions Cleaning;Laundry    Stability/Clinical Decision Making Evolving/Moderate complexity    Rehab Potential Good    PT Frequency 2x / week    PT Duration 6 weeks    PT Treatment/Interventions ADLs/Self Care Home Management;Aquatic Therapy;Electrical Stimulation;Gait training;Stair training;Functional mobility training;Therapeutic activities;Therapeutic exercise;Balance training;Neuromuscular  re-education;Patient/family education;Prosthetic Training;Manual techniques;Passive range of motion;Energy conservation    PT Next Visit Plan Contine stair training (rail on left ascending)    PT Home Exercise Plan No updates to HEP this date    Consulted and Agree with Plan of Care Patient             Patient will benefit from skilled therapeutic intervention in order to improve the following deficits and impairments:  Abnormal gait, Decreased balance, Decreased endurance, Decreased mobility, Decreased activity tolerance  Visit Diagnosis: Pain in left lower leg  Difficulty in walking, not elsewhere classified  Unsteadiness on feet  Abnormality of gait and mobility     Problem List Patient Active Problem List   Diagnosis Date Noted   Primary localized osteoarthritis of left hip 03/30/2019   Status post left hip replacement 03/30/2019    Particia Lather, PT 10/16/2021, 2:43 PM  [place] [address] [place], [place], [address] Phone: [phone]   Fax:  [phone]  Name: [name] MRN: [id number] Date of Birth: 1946-09-13

## 2021-10-18 ENCOUNTER — Ambulatory Visit: Payer: Medicare Other | Admitting: Physical Therapy

## 2021-10-18 ENCOUNTER — Other Ambulatory Visit: Payer: Self-pay

## 2021-10-18 DIAGNOSIS — M79662 Pain in left lower leg: Secondary | ICD-10-CM | POA: Diagnosis not present

## 2021-10-18 DIAGNOSIS — R269 Unspecified abnormalities of gait and mobility: Secondary | ICD-10-CM | POA: Diagnosis not present

## 2021-10-18 DIAGNOSIS — M6281 Muscle weakness (generalized): Secondary | ICD-10-CM | POA: Diagnosis not present

## 2021-10-18 DIAGNOSIS — R262 Difficulty in walking, not elsewhere classified: Secondary | ICD-10-CM | POA: Diagnosis not present

## 2021-10-18 DIAGNOSIS — R278 Other lack of coordination: Secondary | ICD-10-CM | POA: Diagnosis not present

## 2021-10-18 DIAGNOSIS — R2681 Unsteadiness on feet: Secondary | ICD-10-CM

## 2021-10-18 NOTE — Therapy (Signed)
[place] MAIN [facility] SERVICES [address] [place], [place], [address] Phone: [phone]   Fax:  [phone]  Physical Therapy Treatment  Patient Details  Name: [name] MRN: [id number] Date of Birth: Nov 20, 1945 Referring Provider (PT): Merlene Pulling PA-C   Encounter Date: 10/18/2021   PT End of Session - 10/18/21 0946     Visit Number 34    Number of Visits 39    Date for PT Re-Evaluation 12/13/21    Authorization Time Period recert 06/22/82-12/20/23    Progress Note Due on Visit 64    PT Start Time 0938    PT Stop Time 1016    PT Time Calculation (min) 38 min    Equipment Utilized During Treatment Gait belt    Activity Tolerance Patient tolerated treatment well    Behavior During Therapy WFL for tasks assessed/performed             Past Medical History:  Diagnosis Date   Allergic rhinitis    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   Heart murmur    mild mitral regurg-echo 2010   Hyperlipidemia    Hypertension    Lupus anticoagulant positive    Obesity    Pre-diabetes    Primary localized osteoarthritis of left hip 03/30/2019   Stroke [facility]) 2010   Some right sided weakness.  Gait issues.   Wears dentures    partial upper    Past Surgical History:  Procedure Laterality Date   ABDOMINAL HYSTERECTOMY  1981   APPENDECTOMY     AXILLARY LYMPH NODE BIOPSY Left 05/16/2014   Procedure: EXCISION 2CM LEFT AXILLARY MASS;  Surgeon: [name], MD;  Location: [facility];  Service: General;  Laterality: Left;  Left axillary sebaceous cyst excision   CATARACT EXTRACTION W/PHACO Left 04/11/2020   Procedure: CATARACT EXTRACTION PHACO AND INTRAOCULAR LENS PLACEMENT ([place]) LEFT DIABETIC  TORIC LENS  6.05 00:34.9;  Surgeon: [name], MD;  Location: [facility];  Service: Ophthalmology;  Laterality: Left;   CATARACT EXTRACTION W/PHACO Right 12/12/2020   Procedure: CATARACT EXTRACTION PHACO AND INTRAOCULAR LENS PLACEMENT ([place]) RIGHT TORIC LENS;  Surgeon: [name], MD;  Location: [facility];  Service: Ophthalmology;  Laterality: Right;  6.55 0:41.1   DILATION AND CURETTAGE OF UTERUS     SKIN CANCER EXCISION  2016   [facility]   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   Procedure: TOTAL HIP ARTHROPLASTY;  Surgeon: [name], MD;  [facility];  Service: Orthopedics;  Laterality: Left;    There were no vitals filed for this visit.   Subjective Assessment - 10/18/21 0940     Subjective Pt reports no significant changes since last session.    Pertinent History Pt reports she had a stroke in 2010 and was in inpatient rehab for 4 weeks. Pt reports she has been to therapy about one time a year since the onset of her stroke for various ailments including L THA in 2020. Pt reports she has bioness L300 which improved her drop foot on her R ankle. Pt has depended on her L LE since the stroke due to her weakness and impaired funciton on the right LE. Since the fall she has  had throbbing pain in the left LE which is exacerbated with increased weightbearing activity. Pt reports she has had x rays and it shows nothing is broken but despite this she has had continued pain in the left. Pt reports pain is on the lateral aspect of her left LE up into her posterior thigh. Pt reports doctor thinks this could be from nerve related pain but will need PT in order for her insurance to cover MRI of the lumbar spine. Prior to this fall she fell and broke her right wrist.  Pt reports she can perform any activities above the level of the wast but she is unable to bend over to complete tasks. Pt reports she has multiple wakers, 3 wheel, 4 wheel, wheelchair, standard rolling walker.  Pt had brain MRI following fall and no reports of bleeding or issues.    Limitations Walking    How long can you sit comfortably? a couple hours due to low back pain    How long can you stand comfortably? 15 minutes    How long can you walk comfortably? Able to walk around [employer] but was very fatigued following    Currently in Pain? Yes    Pain Score 3     Pain Location Leg    Pain Orientation Left    Pain Descriptors / Indicators Aching    Pain Onset More than a month ago              Manual therapy: performed with pt in right sidelying position   Soft tissue massage to left piriformis/posterior hip, and greater trochanter along IT band x15  min, utilized rolling stick and therapist hands with cross friction and myofascial release technique; Ischemic trigger point release to left piriformis 30 sec hold x2 reps    transitioned to supine and PT performed piriformis stretch  SKTC stretch 2 x 45 sec ea (Manually)   Required Supervision to transition to sitting position;    Therex:   Exercise/Activity Sets/ Reps/Time/ Management consultant type Comments  Bridges  Clamshells  Hip ABD  2 x 10  GTB 2 x 10  2 x 10   therex Supine   Heel raises X 20   B UE Therex  Cues for minimizing anterior weight shifting   Marching 10 x each B UE  Neuro re-ed Cues for slow and controlled movements         Airex step up 10 x ea  B UE Neuro re-ed Cues to loosen grip on UE in order to allow for LE balance and corrective righting responses                                       Treatment provided this session   Pt educated throughout session about proper posture and technique with exercises. Improved exercise technique, movement at target joints, use of target muscles after min to mod verbal, visual, tactile cues. Note: Portions of this document were prepared using Dragon voice recognition software and although reviewed may contain unintentional dictation errors in syntax, grammar, or  spelling.    Treatment provided this session      Pt educated throughout session about proper posture and technique with exercises. Improved exercise technique, movement at target joints, use of target muscles after min to mod verbal, visual, tactile cues.  PT Education - 10/18/21 1028     Education Details exercise form and technique    Person(s) Educated Patient    Methods Explanation;Verbal cues    Comprehension Verbalized understanding;Returned demonstration;Verbal cues required;Tactile cues required              PT Short Term Goals - 10/02/21 1027       PT SHORT TERM GOAL #1   Title Patient will be independent in home exercise program to improve strength/mobility for better functional independence with ADLs.    Baseline Met    Time 3    Period Days    Status Achieved      PT SHORT TERM GOAL #2   Title Pt will be able to complete 6 MWT without requiring rest break or sitting in order to indicate ipmroved aerobic and muscular endurance for everyday tasks.    Baseline 835 feet with 3 wheel walker at PN 9/8    Time 4    Period Weeks    Status Achieved      PT SHORT TERM GOAL #3   Title Patient will complete five times sit to stand test in < 25 seconds indicating an increased LE strength and improved balance.    Baseline 16 sec 9/8 with UE use    Time 4    Period Weeks    Status Achieved    Target Date 06/18/21               PT Long Term Goals - 10/02/21 1027       PT LONG TERM GOAL #1   Title Pt will improve FOTO score to 60 in order to indicate improvement with everyday tasks    Baseline FOTO: 58 om 9/8 (met initial foto goal), 1025: 58% 12/8: 57%, 12/20: 60%    Time 12    Period Weeks    Status Achieved    Target Date 12/13/21      PT LONG TERM GOAL #2   Title Patient will complete five times sit to stand test in < 13 seconds indicating an increased LE strength and improved balance.    Baseline  5XSTS in 16 sec with UE use 06/21/21, 10/25: 18 sec with UE use, 15.1 sec on 12/8, 12/19: 14 sec with 1 UE assist;    Time 8    Period Weeks    Status Partially Met    Target Date 11/15/21      PT LONG TERM GOAL #3   Title Patient will increase six minute walk test distance to >900 feet for progression to improve gait ability and safety with ambulation    Baseline 10/25: 650 feet with walker, 12/8: 825 with 3WW, 12/19: 715 feet with 3 wheeled walker    Time 8    Period Weeks    Status On-going    Target Date 09/18/21      PT LONG TERM GOAL #4   Title Pt will safely demonstrate ability to navigate up and down 4 stairs without assistance in order to improve ability to navigate stairs in her daily life    Baseline able to ascend 1 stair but too reports fear of descending more than 1 stair, 12/8: not assessed, 12/20: not assessed;    Time 6    Period Weeks    Status Not Met    Target Date 09/18/21                   Plan - 10/18/21 9242  Clinical Impression Statement Patient participated well in physical therapy sessions and states continued improvement in LE pain on the left. Pt progressed with several supine exercises targetting muscles addressed with manual therapy. Pt tolerated these exercises well and also was able to perform several standing exercsies today. With standing exercises pt demonstrated significant reliance on UE due to fear of falling but was encouraged to minimize UE utilization while in controled envoronment of PT. Will continue to progress working on more balance and standing mobility to reduce her fear of falling and improve her overall mobility and quality of life.    Personal Factors and Comorbidities Age;Comorbidity 1;Transportation    Examination-Activity Limitations Bend;Carry;Squat;Stairs    Examination-Participation Restrictions Cleaning;Laundry    Stability/Clinical Decision Making Evolving/Moderate complexity    Rehab Potential Good    PT Frequency  2x / week    PT Duration 6 weeks    PT Treatment/Interventions ADLs/Self Care Home Management;Aquatic Therapy;Electrical Stimulation;Gait training;Stair training;Functional mobility training;Therapeutic activities;Therapeutic exercise;Balance training;Neuromuscular re-education;Patient/family education;Prosthetic Training;Manual techniques;Passive range of motion;Energy conservation    PT Next Visit Plan standing and balance    PT Home Exercise Plan No updates to HEP this date    Consulted and Agree with Plan of Care Patient             Patient will benefit from skilled therapeutic intervention in order to improve the following deficits and impairments:  Abnormal gait, Decreased balance, Decreased endurance, Decreased mobility, Decreased activity tolerance  Visit Diagnosis: Pain in left lower leg  Difficulty in walking, not elsewhere classified  Unsteadiness on feet  Abnormality of gait and mobility     Problem List Patient Active Problem List   Diagnosis Date Noted   Primary localized osteoarthritis of left hip 03/30/2019   Status post left hip replacement 03/30/2019    Particia Lather, PT 10/18/2021, 10:35 AM  [place] MAIN [facility] SERVICES [address] [place], [place], [address] Phone: [phone]   Fax:  [phone]  Name: [name] MRN: [id number] Date of Birth: 1946/08/20

## 2021-10-23 ENCOUNTER — Other Ambulatory Visit: Payer: Self-pay

## 2021-10-23 ENCOUNTER — Ambulatory Visit: Payer: Medicare Other | Admitting: Physical Therapy

## 2021-10-23 ENCOUNTER — Encounter: Payer: Self-pay | Admitting: Physical Therapy

## 2021-10-23 DIAGNOSIS — R269 Unspecified abnormalities of gait and mobility: Secondary | ICD-10-CM

## 2021-10-23 DIAGNOSIS — R262 Difficulty in walking, not elsewhere classified: Secondary | ICD-10-CM

## 2021-10-23 DIAGNOSIS — R2681 Unsteadiness on feet: Secondary | ICD-10-CM | POA: Diagnosis not present

## 2021-10-23 DIAGNOSIS — R278 Other lack of coordination: Secondary | ICD-10-CM | POA: Diagnosis not present

## 2021-10-23 DIAGNOSIS — M79662 Pain in left lower leg: Secondary | ICD-10-CM | POA: Diagnosis not present

## 2021-10-23 DIAGNOSIS — M6281 Muscle weakness (generalized): Secondary | ICD-10-CM | POA: Diagnosis not present

## 2021-10-23 NOTE — Therapy (Signed)
[place] MAIN [facility] SERVICES [address] [place], [place], [address] Phone: [phone]   Fax:  [phone]  Physical Therapy Treatment  Patient Details  Name: [name] MRN: [id number] Date of Birth: December 18, 1945 Referring Provider (PT): Merlene Pulling PA-C   Encounter Date: 10/23/2021   PT End of Session - 10/23/21 1043     Visit Number 35    Number of Visits 51    Date for PT Re-Evaluation 12/13/21    Authorization Time Period recert 00/1/74-06/17/48    Progress Note Due on Visit 60    PT Start Time 0930    PT Stop Time 1008    PT Time Calculation (min) 38 min    Equipment Utilized During Treatment Gait belt    Activity Tolerance Patient tolerated treatment well    Behavior During Therapy WFL for tasks assessed/performed             Past Medical History:  Diagnosis Date   Allergic rhinitis    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   Heart murmur    mild mitral regurg-echo 2010   Hyperlipidemia    Hypertension    Lupus anticoagulant positive    Obesity    Pre-diabetes    Primary localized osteoarthritis of left hip 03/30/2019   Stroke [facility]) 2010   Some right sided weakness.  Gait issues.   Wears dentures    partial upper    Past Surgical History:  Procedure Laterality Date   ABDOMINAL HYSTERECTOMY  1981   APPENDECTOMY     AXILLARY LYMPH NODE BIOPSY Left 05/16/2014   Procedure: EXCISION 2CM LEFT AXILLARY MASS;  Surgeon: [name], MD;  Location: [facility];  Service: General;  Laterality: Left;  Left axillary sebaceous cyst excision   CATARACT EXTRACTION W/PHACO Left 04/11/2020   Procedure: CATARACT EXTRACTION PHACO AND INTRAOCULAR LENS PLACEMENT ([place]) LEFT DIABETIC  TORIC LENS  6.05 00:34.9;  Surgeon: [name], MD;  Location: [facility];  Service: Ophthalmology;  Laterality: Left;   CATARACT EXTRACTION W/PHACO Right 12/12/2020   Procedure: CATARACT EXTRACTION PHACO AND INTRAOCULAR LENS PLACEMENT ([place]) RIGHT TORIC LENS;  Surgeon: [name], MD;  Location: [facility];  Service: Ophthalmology;  Laterality: Right;  6.55 0:41.1   DILATION AND CURETTAGE OF UTERUS     SKIN CANCER EXCISION  2016   [facility]   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   Procedure: TOTAL HIP ARTHROPLASTY;  Surgeon: [name], MD;  [facility];  Service: Orthopedics;  Laterality: Left;    There were no vitals filed for this visit.   Subjective Assessment - 10/23/21 1227     Subjective Pt reports no significant changes since last session. Reports spot on her leg no longer throbbing but she can still "feel it"    Pertinent History Pt reports she had a stroke in 2010 and was in inpatient rehab for 4 weeks. Pt reports she has been to therapy about one time a year since the onset of her stroke for various ailments including L THA in 2020. Pt reports she has bioness L300 which improved her drop foot on her R ankle. Pt has depended on her L LE since the stroke due to  her weakness and impaired funciton on the right LE. Since the fall she has had throbbing pain in the left LE which is exacerbated with increased weightbearing activity. Pt reports she has had x rays and it shows nothing is broken but despite this she has had continued pain in the left. Pt reports pain is on the lateral aspect of her left LE up into her posterior thigh. Pt reports doctor thinks this could be from nerve related pain but will need PT in order for her insurance to cover MRI of the lumbar spine. Prior to this fall she fell and broke her right wrist.  Pt reports she can perform any activities above the level of the wast but she is unable to bend over to complete tasks. Pt reports she has  multiple wakers, 3 wheel, 4 wheel, wheelchair, standard rolling walker. Pt had brain MRI following fall and no reports of bleeding or issues.    Limitations Walking    How long can you sit comfortably? a couple hours due to low back pain    How long can you stand comfortably? 15 minutes    How long can you walk comfortably? Able to walk around [employer] but was very fatigued following    Pain Score 2     Pain Location Leg    Pain Orientation Left    Pain Descriptors / Indicators Aching    Pain Onset More than a month ago                Manual Therapy:   Supine: manual HS stretch and SKTC stretch 3 x 45 sec ea. On last HS stretch pt instructed in nerve glides. X 30 reps of nerve glides.     Manual therapy: performed with pt in right sidelying position   Soft tissue massage to left piriformis/posterior hip, and greater trochanter along IT band x15  min, utilized rolling stick and therapist hands with cross friction and myofascial release technique; Ischemic trigger point release to left piriformis 30 sec hold x2 reps    transitioned to supine and PT performed piriformis stretch,  SKTC stretch 3 x 45 sec ea (Manually)   Required Supervision to transition to sitting position;    Therex:   Exercise/Activity Sets/ Reps/Time/ Resistance Assistance Charge type Comments  Bridges  Clamshells  Hip ABD  2 x 10  GTB 2 x 10  2 x 10   therex Supine   Heel raises 2x10  B UE Therex  Cues for minimizing anterior weight shifting   Marching 10 x each B UE  Therex  Cues for slow and controlled movements                                                   Treatment provided this session   Pt educated throughout session about proper posture and technique with exercises. Improved exercise technique, movement at target joints, use of target muscles after min to mod verbal, visual, tactile cues. Note: Portions of this document were prepared using Dragon voice recognition software and  although reviewed may contain unintentional dictation errors in syntax, grammar, or spelling.    Treatment provided this session                      PT Education - 10/23/21 1227     Education  Details Exercsie technique    Person(s) Educated Patient    Methods Explanation    Comprehension Verbalized understanding;Returned demonstration              PT Short Term Goals - 10/02/21 1027       PT SHORT TERM GOAL #1   Title Patient will be independent in home exercise program to improve strength/mobility for better functional independence with ADLs.    Baseline Met    Time 3    Period Days    Status Achieved      PT SHORT TERM GOAL #2   Title Pt will be able to complete 6 MWT without requiring rest break or sitting in order to indicate ipmroved aerobic and muscular endurance for everyday tasks.    Baseline 835 feet with 3 wheel walker at PN 9/8    Time 4    Period Weeks    Status Achieved      PT SHORT TERM GOAL #3   Title Patient will complete five times sit to stand test in < 25 seconds indicating an increased LE strength and improved balance.    Baseline 16 sec 9/8 with UE use    Time 4    Period Weeks    Status Achieved    Target Date 06/18/21               PT Long Term Goals - 10/02/21 1027       PT LONG TERM GOAL #1   Title Pt will improve FOTO score to 60 in order to indicate improvement with everyday tasks    Baseline FOTO: 58 om 9/8 (met initial foto goal), 1025: 58% 12/8: 57%, 12/20: 60%    Time 12    Period Weeks    Status Achieved    Target Date 12/13/21      PT LONG TERM GOAL #2   Title Patient will complete five times sit to stand test in < 13 seconds indicating an increased LE strength and improved balance.    Baseline 5XSTS in 16 sec with UE use 06/21/21, 10/25: 18 sec with UE use, 15.1 sec on 12/8, 12/19: 14 sec with 1 UE assist;    Time 8    Period Weeks    Status Partially Met    Target Date 11/15/21      PT LONG  TERM GOAL #3   Title Patient will increase six minute walk test distance to >900 feet for progression to improve gait ability and safety with ambulation    Baseline 10/25: 650 feet with walker, 12/8: 825 with 3WW, 12/19: 715 feet with 3 wheeled walker    Time 8    Period Weeks    Status On-going    Target Date 09/18/21      PT LONG TERM GOAL #4   Title Pt will safely demonstrate ability to navigate up and down 4 stairs without assistance in order to improve ability to navigate stairs in her daily life    Baseline able to ascend 1 stair but too reports fear of descending more than 1 stair, 12/8: not assessed, 12/20: not assessed;    Time 6    Period Weeks    Status Not Met    Target Date 09/18/21                   Plan - 10/23/21 1044     Clinical Impression Statement Patient participated well in physical therapy sessions and states continued improvement  in LE pain on the left. Pt continued with several supine exercises targetting muscles addressed with manual therapy. Pt tolerated these exercises well and also was able to perform several standing exercsies today. PT session was cut a little short today due to pt having an appointnt soon after this scheduled appointment. Will continue to progress working on more balance and standing mobility to reduce her fear of falling and improve her overall mobility and quality of life.    Personal Factors and Comorbidities Age;Comorbidity 1;Transportation    Examination-Activity Limitations Bend;Carry;Squat;Stairs    Examination-Participation Restrictions Cleaning;Laundry    Stability/Clinical Decision Making Evolving/Moderate complexity    Rehab Potential Good    PT Frequency 2x / week    PT Duration 6 weeks    PT Treatment/Interventions ADLs/Self Care Home Management;Aquatic Therapy;Electrical Stimulation;Gait training;Stair training;Functional mobility training;Therapeutic activities;Therapeutic exercise;Balance training;Neuromuscular  re-education;Patient/family education;Prosthetic Training;Manual techniques;Passive range of motion;Energy conservation    PT Next Visit Plan standing and balance    PT Home Exercise Plan No updates to HEP this date    Consulted and Agree with Plan of Care Patient             Patient will benefit from skilled therapeutic intervention in order to improve the following deficits and impairments:  Abnormal gait, Decreased balance, Decreased endurance, Decreased mobility, Decreased activity tolerance  Visit Diagnosis: Pain in left lower leg  Difficulty in walking, not elsewhere classified  Unsteadiness on feet  Abnormality of gait and mobility     Problem List Patient Active Problem List   Diagnosis Date Noted   Primary localized osteoarthritis of left hip 03/30/2019   Status post left hip replacement 03/30/2019    Particia Lather, PT 10/23/2021, 12:33 PM  [place] [address] [place], [place], [address] Phone: [phone]   Fax:  [phone]  Name: [name] MRN: [id number] Date of Birth: 07/19/1946

## 2021-10-25 ENCOUNTER — Ambulatory Visit: Payer: Medicare Other | Admitting: Physical Therapy

## 2021-10-25 ENCOUNTER — Other Ambulatory Visit: Payer: Self-pay

## 2021-10-25 DIAGNOSIS — M79662 Pain in left lower leg: Secondary | ICD-10-CM

## 2021-10-25 DIAGNOSIS — R262 Difficulty in walking, not elsewhere classified: Secondary | ICD-10-CM | POA: Diagnosis not present

## 2021-10-25 DIAGNOSIS — R278 Other lack of coordination: Secondary | ICD-10-CM | POA: Diagnosis not present

## 2021-10-25 DIAGNOSIS — R269 Unspecified abnormalities of gait and mobility: Secondary | ICD-10-CM | POA: Diagnosis not present

## 2021-10-25 DIAGNOSIS — M6281 Muscle weakness (generalized): Secondary | ICD-10-CM | POA: Diagnosis not present

## 2021-10-25 DIAGNOSIS — R2681 Unsteadiness on feet: Secondary | ICD-10-CM | POA: Diagnosis not present

## 2021-10-25 NOTE — Therapy (Signed)
[place] MAIN [facility] SERVICES [address] [place], [place], [address] Phone: [phone]   Fax:  [phone]  Physical Therapy Treatment  Patient Details  Name: [name] MRN: [id number] Date of Birth: January 25, 1946 Referring Provider (PT): Merlene Pulling PA-C   Encounter Date: 10/25/2021   PT End of Session - 10/25/21 1123     Visit Number 36    Number of Visits 89    Date for PT Re-Evaluation 12/13/21    Authorization Time Period recert 92/9/57-01/18/33    Progress Note Due on Visit 44    PT Start Time 0930    PT Stop Time 1015    PT Time Calculation (min) 45 min    Equipment Utilized During Treatment Gait belt    Activity Tolerance Patient tolerated treatment well    Behavior During Therapy WFL for tasks assessed/performed             Past Medical History:  Diagnosis Date   Allergic rhinitis    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   Heart murmur    mild mitral regurg-echo 2010   Hyperlipidemia    Hypertension    Lupus anticoagulant positive    Obesity    Pre-diabetes    Primary localized osteoarthritis of left hip 03/30/2019   Stroke [facility]) 2010   Some right sided weakness.  Gait issues.   Wears dentures    partial upper    Past Surgical History:  Procedure Laterality Date   ABDOMINAL HYSTERECTOMY  1981   APPENDECTOMY     AXILLARY LYMPH NODE BIOPSY Left 05/16/2014   Procedure: EXCISION 2CM LEFT AXILLARY MASS;  Surgeon: [name], MD;  Location: [place];  Service: General;  Laterality: Left;  Left axillary sebaceous cyst excision   CATARACT EXTRACTION W/PHACO Left 04/11/2020   Procedure: CATARACT EXTRACTION PHACO AND INTRAOCULAR LENS PLACEMENT ([place]) LEFT DIABETIC  TORIC LENS  6.05 00:34.9;  Surgeon: [name], MD;  Location: [place];  Service: Ophthalmology;  Laterality: Left;   CATARACT EXTRACTION W/PHACO Right 12/12/2020   Procedure: CATARACT EXTRACTION PHACO AND INTRAOCULAR LENS PLACEMENT ([place]) RIGHT TORIC LENS;  Surgeon: [name], MD;  Location: [place];  Service: Ophthalmology;  Laterality: Right;  6.55 0:41.1   DILATION AND CURETTAGE OF UTERUS     SKIN CANCER EXCISION  2016   [place]   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   Procedure: TOTAL HIP ARTHROPLASTY;  Surgeon: [name], MD;  [facility];  Service: Orthopedics;  Laterality: Left;    There were no vitals filed for this visit.   Subjective Assessment - 10/25/21 0937     Subjective Pt reports no significant changes since last session. Reports spot on her leg has been much less bothersome and limiting. Does report some discomfort on the R side due to some sciatica and pinched nerve that has been present for some time.    Pertinent History Pt reports she had a stroke in 2010 and was in inpatient rehab for 4 weeks. Pt reports she has been to therapy about one time a year since the onset of her stroke for various ailments including L THA in 2020. Pt reports she has bioness L300 which  improved her drop foot on her R ankle. Pt has depended on her L LE since the stroke due to her weakness and impaired funciton on the right LE. Since the fall she has had throbbing pain in the left LE which is exacerbated with increased weightbearing activity. Pt reports she has had x rays and it shows nothing is broken but despite this she has had continued pain in the left. Pt reports pain is on the lateral aspect of her left LE up into her posterior thigh. Pt reports doctor thinks this could be from nerve related pain but will need PT in order for her insurance to cover MRI of the lumbar spine. Prior to this fall she fell and broke her right wrist.  Pt reports she can  perform any activities above the level of the wast but she is unable to bend over to complete tasks. Pt reports she has multiple wakers, 3 wheel, 4 wheel, wheelchair, standard rolling walker. Pt had brain MRI following fall and no reports of bleeding or issues.    Limitations Walking    How long can you sit comfortably? a couple hours due to low back pain    How long can you stand comfortably? 15 minutes    How long can you walk comfortably? Able to walk around [employer] but was very fatigued following    Currently in Pain? Yes    Pain Score 2     Pain Location Leg    Pain Orientation Left    Pain Descriptors / Indicators Aching    Pain Type Chronic pain    Pain Onset More than a month ago                  Manual Therapy:   Supine: manual HS stretch and SKTC stretch 3 x 45 sec ea. On last HS stretch pt instructed in nerve glides. X 30 reps of nerve glides.     Manual therapy: performed with pt in right sidelying position   Soft tissue massage to left piriformis/posterior hip, and greater trochanter along IT band x15  min, Stm and IASTM with cross friction and myofascial release technique; Ischemic trigger point release to left glute medialis     transitioned to supine and PT performed piriformis stretch,  SKTC stretch 3 x 45 sec ea (Manually)   Required Supervision to transition to sitting position;    Therex:   Exercise/Activity Sets/ Reps/Time/ Resistance Assistance Charge type Comments  Bridges  Clamshells  Hip ABD - SL 2 x 10  GTB 2 x 10 x 5 sec hold 2 x 10   therex Supine - cues to prevent knees from touching   Sidelying         Toe taps to hedgehogs and cones  2 x 10 x each B UE  Theract Cues for slow and controlled movements and gentle touches to hedgehog -to facilite LE control and coordination   Balance bars obstacle course- cones   6 times through  3WW SBA  TherACT  To work on coordination and spatial awareness  Increased difficulty with attention to  objects on the right side, with multiple errors of 3WW hitting this side                                              Treatment provided this session   Pt educated  throughout session about proper posture and technique with exercises. Improved exercise technique, movement at target joints, use of target muscles after min to mod verbal, visual, tactile cues.  Note: Portions of this document were prepared using Dragon voice recognition software and although reviewed may contain unintentional dictation errors in syntax, grammar, or spelling.              PT Education - 10/25/21 1122     Education Details Exercise technique    Person(s) Educated Patient    Methods Explanation    Comprehension Verbalized understanding;Returned demonstration              PT Short Term Goals - 10/02/21 1027       PT SHORT TERM GOAL #1   Title Patient will be independent in home exercise program to improve strength/mobility for better functional independence with ADLs.    Baseline Met    Time 3    Period Days    Status Achieved      PT SHORT TERM GOAL #2   Title Pt will be able to complete 6 MWT without requiring rest break or sitting in order to indicate ipmroved aerobic and muscular endurance for everyday tasks.    Baseline 835 feet with 3 wheel walker at PN 9/8    Time 4    Period Weeks    Status Achieved      PT SHORT TERM GOAL #3   Title Patient will complete five times sit to stand test in < 25 seconds indicating an increased LE strength and improved balance.    Baseline 16 sec 9/8 with UE use    Time 4    Period Weeks    Status Achieved    Target Date 06/18/21               PT Long Term Goals - 10/02/21 1027       PT LONG TERM GOAL #1   Title Pt will improve FOTO score to 60 in order to indicate improvement with everyday tasks    Baseline FOTO: 58 om 9/8 (met initial foto goal), 1025: 58% 12/8: 57%, 12/20: 60%    Time 12    Period Weeks    Status Achieved     Target Date 12/13/21      PT LONG TERM GOAL #2   Title Patient will complete five times sit to stand test in < 13 seconds indicating an increased LE strength and improved balance.    Baseline 5XSTS in 16 sec with UE use 06/21/21, 10/25: 18 sec with UE use, 15.1 sec on 12/8, 12/19: 14 sec with 1 UE assist;    Time 8    Period Weeks    Status Partially Met    Target Date 11/15/21      PT LONG TERM GOAL #3   Title Patient will increase six minute walk test distance to >900 feet for progression to improve gait ability and safety with ambulation    Baseline 10/25: 650 feet with walker, 12/8: 825 with 3WW, 12/19: 715 feet with 3 wheeled walker    Time 8    Period Weeks    Status On-going    Target Date 09/18/21      PT LONG TERM GOAL #4   Title Pt will safely demonstrate ability to navigate up and down 4 stairs without assistance in order to improve ability to navigate stairs in her daily life    Baseline able to ascend 1 stair but  too reports fear of descending more than 1 stair, 12/8: not assessed, 12/20: not assessed;    Time 6    Period Weeks    Status Not Met    Target Date 09/18/21                   Plan - 10/25/21 1123     Clinical Impression Statement Patient participated well in physical therapy sessions and states continued improvement in LE pain on the left. Pt continued with several supine exercises targetting muscles addressed with manual therapy. Pt tolerated these exercises well and also was able to perform several standing exercsies today. Challenged pt awareness to objects on her R side via obstacle course and with VC of performance pt had improvement in attention to these objects with less error.  Will continue to progress working on more balance and standing mobility to reduce her fear of falling and improve her overall mobility and quality of life.    Personal Factors and Comorbidities Age;Comorbidity 1;Transportation    Examination-Activity Limitations  Bend;Carry;Squat;Stairs    Examination-Participation Restrictions Cleaning;Laundry    Stability/Clinical Decision Making Evolving/Moderate complexity    Rehab Potential Good    PT Frequency 2x / week    PT Duration 6 weeks    PT Treatment/Interventions ADLs/Self Care Home Management;Aquatic Therapy;Electrical Stimulation;Gait training;Stair training;Functional mobility training;Therapeutic activities;Therapeutic exercise;Balance training;Neuromuscular re-education;Patient/family education;Prosthetic Training;Manual techniques;Passive range of motion;Energy conservation    PT Next Visit Plan attention to objects on her R side via coordination and balance tasks.    PT Home Exercise Plan No updates to HEP this date    Consulted and Agree with Plan of Care Patient             Patient will benefit from skilled therapeutic intervention in order to improve the following deficits and impairments:  Abnormal gait, Decreased balance, Decreased endurance, Decreased mobility, Decreased activity tolerance  Visit Diagnosis: Pain in left lower leg  Difficulty in walking, not elsewhere classified  Unsteadiness on feet  Abnormality of gait and mobility     Problem List Patient Active Problem List   Diagnosis Date Noted   Primary localized osteoarthritis of left hip 03/30/2019   Status post left hip replacement 03/30/2019    Particia Lather, PT 10/25/2021, 11:26 AM  [place] [address] [place], [place], [address] Phone: [phone]   Fax:  [phone]  Name: [name] MRN: [id number] Date of Birth: 1946/05/23

## 2021-10-29 DIAGNOSIS — Z7901 Long term (current) use of anticoagulants: Secondary | ICD-10-CM | POA: Diagnosis not present

## 2021-10-30 ENCOUNTER — Ambulatory Visit: Payer: Medicare Other | Admitting: Physical Therapy

## 2021-11-01 ENCOUNTER — Ambulatory Visit: Payer: Medicare Other

## 2021-11-06 ENCOUNTER — Encounter: Payer: Self-pay | Admitting: Physical Therapy

## 2021-11-06 ENCOUNTER — Ambulatory Visit: Payer: Medicare Other | Admitting: Physical Therapy

## 2021-11-06 ENCOUNTER — Other Ambulatory Visit: Payer: Self-pay

## 2021-11-06 DIAGNOSIS — R2681 Unsteadiness on feet: Secondary | ICD-10-CM

## 2021-11-06 DIAGNOSIS — R2689 Other abnormalities of gait and mobility: Secondary | ICD-10-CM

## 2021-11-06 DIAGNOSIS — R269 Unspecified abnormalities of gait and mobility: Secondary | ICD-10-CM

## 2021-11-06 DIAGNOSIS — R278 Other lack of coordination: Secondary | ICD-10-CM

## 2021-11-06 DIAGNOSIS — M6281 Muscle weakness (generalized): Secondary | ICD-10-CM

## 2021-11-06 DIAGNOSIS — M79662 Pain in left lower leg: Secondary | ICD-10-CM

## 2021-11-06 DIAGNOSIS — R262 Difficulty in walking, not elsewhere classified: Secondary | ICD-10-CM

## 2021-11-06 NOTE — Therapy (Signed)
[place] MAIN [facility] SERVICES [address] [place], [place], [address] Phone: [phone]   Fax:  [phone]  Physical Therapy Treatment  Patient Details  Name: [name] MRN: [id number] Date of Birth: [birth year] Referring Provider (PT): Merlene Pulling PA-C   Encounter Date: 11/06/2021   PT End of Session - 11/06/21 0943     Visit Number 37    Number of Visits 15    Date for PT Re-Evaluation 12/13/21    Authorization Time Period recert 57/3/22-0/2/54    Progress Note Due on Visit 50    PT Start Time 0940    PT Stop Time 1015    PT Time Calculation (min) 35 min    Equipment Utilized During Treatment Gait belt    Activity Tolerance Patient tolerated treatment well    Behavior During Therapy WFL for tasks assessed/performed             Past Medical History:  Diagnosis Date   Allergic rhinitis    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)    no meds-diet controlled   Factor 5 Leiden mutation, heterozygous ([place])    Family history of adverse reaction to anesthesia    Daughter was "paralyzed" for several days after 1 surgery.   Heart murmur    mild mitral regurg-echo 2010   Hyperlipidemia    Hypertension    Lupus anticoagulant positive    Obesity    Pre-diabetes    Primary localized osteoarthritis of left hip 03/30/2019   Stroke [facility]) 2010   Some right sided weakness.  Gait issues.   Wears dentures    partial upper    Past Surgical History:  Procedure Laterality Date   ABDOMINAL HYSTERECTOMY  1981   APPENDECTOMY     AXILLARY LYMPH NODE BIOPSY Left 05/16/2014   Procedure: EXCISION 2CM LEFT AXILLARY MASS;  Surgeon: [name], MD;  Location: [place];  Service: General;  Laterality: Left;  Left axillary sebaceous cyst excision   CATARACT EXTRACTION W/PHACO Left 04/11/2020   Procedure: CATARACT EXTRACTION PHACO AND INTRAOCULAR LENS PLACEMENT ([place]) LEFT DIABETIC  TORIC LENS  6.05 00:34.9;  Surgeon: [name], MD;  Location: [place];  Service: Ophthalmology;  Laterality: Left;   CATARACT EXTRACTION W/PHACO Right 12/12/2020   Procedure: CATARACT EXTRACTION PHACO AND INTRAOCULAR LENS PLACEMENT ([place]) RIGHT TORIC LENS;  Surgeon: [name], MD;  Location: [place];  Service: Ophthalmology;  Laterality: Right;  6.55 0:41.1   DILATION AND CURETTAGE OF UTERUS     SKIN CANCER EXCISION  2016   [place]   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   Procedure: TOTAL HIP ARTHROPLASTY;  Surgeon: [name], MD;  [facility];  Service: Orthopedics;  Laterality: Left;    There were no vitals filed for this visit.   Subjective Assessment - 11/06/21 0942     Subjective Patient was sick last week. She is feeling a little better today but reports increased stiffness and soreness in LLE lower leg;    Pertinent History Pt reports she had a stroke in 2010 and was in inpatient rehab for 4 weeks. Pt reports she has been to therapy about one time a year since the onset of her stroke for various ailments including L THA in 2020. Pt reports she has bioness L300 which improved her drop foot on her R ankle. Pt has depended on her L LE since the stroke due to  her weakness and impaired funciton on the right LE. Since the fall she has had throbbing pain in the left LE which is exacerbated with increased weightbearing activity. Pt reports she has had x rays and it shows nothing is broken but despite this she has had continued pain in the left. Pt reports pain is on the lateral aspect of her left LE up into her posterior thigh. Pt reports doctor thinks this could be from nerve related pain but will need PT in order for her insurance to cover MRI of the lumbar spine. Prior to this fall she fell and broke her right wrist.  Pt reports she can perform any activities above the level of the wast but she is unable to bend over to complete tasks. Pt reports she  has multiple wakers, 3 wheel, 4 wheel, wheelchair, standard rolling walker. Pt had brain MRI following fall and no reports of bleeding or issues.    Limitations Walking    How long can you sit comfortably? a couple hours due to low back pain    How long can you stand comfortably? 15 minutes    How long can you walk comfortably? Able to walk around [employer] but was very fatigued following    Currently in Pain? Yes    Pain Score 5     Pain Location Leg    Pain Orientation Left    Pain Descriptors / Indicators Aching;Sore    Pain Type Chronic pain    Pain Onset More than a month ago    Pain Frequency Constant    Aggravating Factors  prolonged standing/walking    Pain Relieving Factors PT intervention    Effect of Pain on Daily Activities decreased activity tolerance;               TREATMENT:  Hook[place]:  PT performed passive SLR hamstring stretch 30 sec hold x2 reps Progressed to SLR hamstring stretch with ankle DF/PF neural flossing x10 reps LLE only; PT performed passive single knee to chest stretch 20 sec hold x2 reps each LE PT performed passive piriformis stretch on LLE with moderate tightness noted 30 sec hold x1 reps;  Instructed patient in right sidelying PT performed extensive soft tissue massage to left piriformis x10 min utilizing rolling stick; Patient exhibits moderate stiffness. PT performed ischemic trigger point release to left piriformis 30 sec hold x2 reps;   Patient reports moderate discomfort which was alleviated following soft tissue massage;  PT performed passive LLE hip flexor stretch in right sidelying 30 sec hold x2 reps with moderate tightness reported  Instructed patient in supine PT Performed passive SLR hamstring stretch 30 sec hold x1 rep LLE with neural flossing PT performed passive piriformis stretch with better ROM noted LLE 30 sec hold x2 reps;  Patient instructed in standing and gait x150 feet iwht 3 wheeled walker; She was able to exhibit  increased step length and reports significant reduction in pain and stiffness  NMR: Patient instructed in balance exercise: Standing on airex pad: -unsupported standing 30 sec hold x2 reps eyes open, progressed to 10 sec eyes closed x2 reps with increased instability noted, requiring min A for safety; -Feet apart:  Side/side weight shift, working on reducing rail assist, required mod VCs for positioning and sequencing with heavy UE rail assist required, CGA for safety  Patient instructed in unsupported heel raises, only able to complete with 1 rail assist due to weakness and instability x10 reps;  Head turns side/side, unsupported x5 reps, min  A for safety Patient reports moderate difficulty with all balance exercise;  She tolerated session well, reporting less pain; She does report increased fatigue with advanced exercise.                        PT Education - 11/06/21 1338     Education Details positioning/balance    Person(s) Educated Patient    Methods Explanation;Verbal cues    Comprehension Verbalized understanding;Returned demonstration;Verbal cues required;Need further instruction              PT Short Term Goals - 10/02/21 1027       PT SHORT TERM GOAL #1   Title Patient will be independent in home exercise program to improve strength/mobility for better functional independence with ADLs.    Baseline Met    Time 3    Period Days    Status Achieved      PT SHORT TERM GOAL #2   Title Pt will be able to complete 6 MWT without requiring rest break or sitting in order to indicate ipmroved aerobic and muscular endurance for everyday tasks.    Baseline 835 feet with 3 wheel walker at PN 9/8    Time 4    Period Weeks    Status Achieved      PT SHORT TERM GOAL #3   Title Patient will complete five times sit to stand test in < 25 seconds indicating an increased LE strength and improved balance.    Baseline 16 sec 9/8 with UE use    Time 4    Period  Weeks    Status Achieved    Target Date 06/18/21               PT Long Term Goals - 10/02/21 1027       PT LONG TERM GOAL #1   Title Pt will improve FOTO score to 60 in order to indicate improvement with everyday tasks    Baseline FOTO: 58 om 9/8 (met initial foto goal), 1025: 58% 12/8: 57%, 12/20: 60%    Time 12    Period Weeks    Status Achieved    Target Date 12/13/21      PT LONG TERM GOAL #2   Title Patient will complete five times sit to stand test in < 13 seconds indicating an increased LE strength and improved balance.    Baseline 5XSTS in 16 sec with UE use 06/21/21, 10/25: 18 sec with UE use, 15.1 sec on 12/8, 12/19: 14 sec with 1 UE assist;    Time 8    Period Weeks    Status Partially Met    Target Date 11/15/21      PT LONG TERM GOAL #3   Title Patient will increase six minute walk test distance to >900 feet for progression to improve gait ability and safety with ambulation    Baseline 10/25: 650 feet with walker, 12/8: 825 with 3WW, 12/19: 715 feet with 3 wheeled walker    Time 8    Period Weeks    Status On-going    Target Date 09/18/21      PT LONG TERM GOAL #4   Title Pt will safely demonstrate ability to navigate up and down 4 stairs without assistance in order to improve ability to navigate stairs in her daily life    Baseline able to ascend 1 stair but too reports fear of descending more than 1 stair, 12/8: not assessed, 12/20:  not assessed;    Time 6    Period Weeks    Status Not Met    Target Date 09/18/21                   Plan - 11/06/21 1338     Clinical Impression Statement Patient motivated and participated well within session. She exhibits increased tightness in left posterior hip today with increased tenderness along piriformis. She tolerated manual therapy well reporting less tightness and less pain upon standing. She was instructed in advanced balance tasks. Patient continues to have a high fear of falling, resulting in HHA on  railing. She is hesitant to reduce rail assist. Patient does require CGA to min A with advanced balance exercise. She would benefit from additional skilled PT Intervention to improve balance/gait safety.    Personal Factors and Comorbidities Age;Comorbidity 1;Transportation    Examination-Activity Limitations Bend;Carry;Squat;Stairs    Examination-Participation Restrictions Cleaning;Laundry    Stability/Clinical Decision Making Evolving/Moderate complexity    Rehab Potential Good    PT Frequency 2x / week    PT Duration 6 weeks    PT Treatment/Interventions ADLs/Self Care Home Management;Aquatic Therapy;Electrical Stimulation;Gait training;Stair training;Functional mobility training;Therapeutic activities;Therapeutic exercise;Balance training;Neuromuscular re-education;Patient/family education;Prosthetic Training;Manual techniques;Passive range of motion;Energy conservation    PT Next Visit Plan attention to objects on her R side via coordination and balance tasks.    PT Home Exercise Plan No updates to HEP this date    Consulted and Agree with Plan of Care Patient             Patient will benefit from skilled therapeutic intervention in order to improve the following deficits and impairments:  Abnormal gait, Decreased balance, Decreased endurance, Decreased mobility, Decreased activity tolerance  Visit Diagnosis: Pain in left lower leg  Difficulty in walking, not elsewhere classified  Unsteadiness on feet  Abnormality of gait and mobility  Muscle weakness (generalized)  Other lack of coordination  Other abnormalities of gait and mobility     Problem List Patient Active Problem List   Diagnosis Date Noted   Primary localized osteoarthritis of left hip 03/30/2019   Status post left hip replacement 03/30/2019    Bradly Sangiovanni, PT, DPT 11/06/2021, 1:47 PM  [place] [address] [place], [place],  [address] Phone: [phone]   Fax:  [phone]  Name: [name] MRN: [id number] Date of Birth: 1946/04/02

## 2021-11-07 DIAGNOSIS — Z20822 Contact with and (suspected) exposure to covid-19: Secondary | ICD-10-CM | POA: Diagnosis not present

## 2021-11-08 ENCOUNTER — Other Ambulatory Visit: Payer: Self-pay

## 2021-11-08 ENCOUNTER — Encounter: Payer: Self-pay | Admitting: Physical Therapy

## 2021-11-08 ENCOUNTER — Ambulatory Visit: Payer: Medicare Other | Admitting: Physical Therapy

## 2021-11-08 DIAGNOSIS — R2681 Unsteadiness on feet: Secondary | ICD-10-CM | POA: Diagnosis not present

## 2021-11-08 DIAGNOSIS — R262 Difficulty in walking, not elsewhere classified: Secondary | ICD-10-CM

## 2021-11-08 DIAGNOSIS — R269 Unspecified abnormalities of gait and mobility: Secondary | ICD-10-CM | POA: Diagnosis not present

## 2021-11-08 DIAGNOSIS — M6281 Muscle weakness (generalized): Secondary | ICD-10-CM | POA: Diagnosis not present

## 2021-11-08 DIAGNOSIS — M79662 Pain in left lower leg: Secondary | ICD-10-CM | POA: Diagnosis not present

## 2021-11-08 DIAGNOSIS — R278 Other lack of coordination: Secondary | ICD-10-CM | POA: Diagnosis not present

## 2021-11-08 NOTE — Therapy (Signed)
[place] [facility] [address] [place], [place], [address] Phone: [phone]   Fax:  [phone]  Physical Therapy Treatment  Patient Details  Name: [name] MRN: [id number] Date of Birth: 03-Sep-1946 Referring Provider (PT): Merlene Pulling PA-C   Encounter Date: 11/08/2021   PT End of Session - 11/08/21 1052     Visit Number 38    Number of Visits 33    Date for PT Re-Evaluation 12/13/21    Authorization Time Period recert 04/19/79-05/21/10    Progress Note Due on Visit 3    PT Start Time 0847    PT Stop Time 0931    PT Time Calculation (min) 44 min    Equipment Utilized During Treatment Gait belt    Activity Tolerance Patient tolerated treatment well;No increased pain    Behavior During Therapy WFL for tasks assessed/performed             Past Medical History:  Diagnosis Date   Allergic rhinitis    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   Heart murmur    mild mitral regurg-echo 2010   Hyperlipidemia    Hypertension    Lupus anticoagulant positive    Obesity    Pre-diabetes    Primary localized osteoarthritis of left hip 03/30/2019   Stroke [facility]) 2010   Some right sided weakness.  Gait issues.   Wears dentures    partial upper    Past Surgical History:  Procedure Laterality Date   ABDOMINAL HYSTERECTOMY  1981   APPENDECTOMY     AXILLARY LYMPH NODE BIOPSY Left 05/16/2014   Procedure: EXCISION 2CM LEFT AXILLARY MASS;  Surgeon: [name], MD;  Location: [place];  Service: General;  Laterality: Left;  Left axillary sebaceous cyst excision   CATARACT EXTRACTION W/PHACO Left 04/11/2020   Procedure: CATARACT EXTRACTION PHACO AND INTRAOCULAR LENS PLACEMENT  ([place]) LEFT DIABETIC TORIC LENS  6.05 00:34.9;  Surgeon: [name], MD;  Location: [place];  Service: Ophthalmology;  Laterality: Left;   CATARACT EXTRACTION W/PHACO Right 12/12/2020   Procedure: CATARACT EXTRACTION PHACO AND INTRAOCULAR LENS PLACEMENT ([place]) RIGHT TORIC LENS;  Surgeon: [name], MD;  Location: [place];  Service: Ophthalmology;  Laterality: Right;  6.55 0:41.1   DILATION AND CURETTAGE OF UTERUS     SKIN CANCER EXCISION  2016   [email or web address]   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   Procedure: TOTAL HIP ARTHROPLASTY;  Surgeon: [name], MD;  Location: [facility];  Service: Orthopedics;  Laterality: Left;    There were no vitals filed for this visit.   Subjective Assessment - 11/08/21 1050     Subjective Patient reports increased fatigue. She reports her leg is not hurting quite as bad but is still a little sore.    Pertinent History Pt reports she had a stroke in 2010 and was in inpatient rehab for 4 weeks. Pt reports she has been to therapy about one time a year since the onset of her stroke for various ailments including L THA in 2020. Pt reports she has bioness L300 which improved her drop foot on her R ankle. Pt has depended on her L LE since the stroke due to  her weakness and impaired funciton on the right LE. Since the fall she has had throbbing pain in the left LE which is exacerbated with increased weightbearing activity. Pt reports she has had x rays and it shows nothing is broken but despite this she has had continued pain in the left. Pt reports pain is on the lateral aspect of her left LE up into her posterior thigh. Pt reports doctor thinks this could be from nerve related pain but will need PT in order for her insurance to cover MRI of the lumbar spine. Prior to this fall she fell and broke her right wrist.  Pt reports she can perform any activities above the level of the wast but she is unable to bend over to complete tasks. Pt reports she  has multiple wakers, 3 wheel, 4 wheel, wheelchair, standard rolling walker. Pt had brain MRI following fall and no reports of bleeding or issues.    Limitations Walking    How long can you sit comfortably? a couple hours due to low back pain    How long can you stand comfortably? 15 minutes    How long can you walk comfortably? Able to walk around [place] but was very fatigued following    Currently in Pain? Yes    Pain Score 3     Pain Location Leg    Pain Orientation Left;Lower;Lateral    Pain Descriptors / Indicators Aching;Sore    Pain Type Chronic pain    Pain Onset More than a month ago    Pain Frequency Intermittent    Aggravating Factors  prolonged standing/walking    Pain Relieving Factors PT intervention    Effect of Pain on Daily Activities decreased activity tolerance;    Multiple Pain Sites No               TREATMENT:  Hook[place]:   PT performed passive SLR hamstring stretch 30 sec hold x2 reps Progressed to SLR hamstring stretch with ankle DF/PF neural flossing x10 reps LLE only; PT performed passive single knee to chest stretch 20 sec hold x2 reps each LE PT performed passive piriformis stretch on LLE with moderate tightness noted 30 sec hold x1 reps;   Instructed patient in right sidelying PT performed extensive soft tissue massage to left piriformis x45mn utilizing rolling stick; Patient exhibits moderate stiffness. PT performed ischemic trigger point release to left piriformis 30 sec hold x2 reps; She continues to have moderate tenderness along left piriformis but exhibits less tightness compared to previous sessions; Following manual therapy patient exhibits better tissue extensibility with less tightness and less trigger points;      Patient reports moderate discomfort which was alleviated following soft tissue massage;     Instructed patient in supine PT Performed passive SLR hamstring stretch 30 sec hold x1 rep LLE with neural flossing PT performed  passive piriformis stretch with better ROM noted LLE 30 sec hold x2 reps;   Patient instructed in standing and gait x20 feet with 3 wheeled walker; She was able to exhibit increased step length and reports significant reduction in pain and stiffness   NMR: Patient instructed in balance exercise: Standing on airex pad: -unsupported standing   Head turns side/side x5 reps  Progressed to arms across chest trunk rotation x3 reps each with min A for safety and decreased trunk rotation due to fear of falling;  Standing on 1/2 bolster: (flat side up) -heel/toe rock x10 reps with BUE rail assist with min-mod stretch reported in  [place]f especially on LLE -feet apart, neutral stance unsupported 10 sec hold x2 reps- moderate difficulty reported; Patient does require CGA -min A for safety; She exhibits decreased ankle strategies often relying on hip/trunk for better balance control;    She tolerated session well, reporting less pain; She does report increased fatigue with advanced exercise.                                PT Education - 11/08/21 1052     Education Details positioning/balance;    Person(s) Educated Patient    Methods Explanation;Verbal cues    Comprehension Verbalized understanding;Returned demonstration;Verbal cues required;Need further instruction              PT Short Term Goals - 10/02/21 1027       PT SHORT TERM GOAL #1   Title Patient will be independent in home exercise program to improve strength/mobility for better functional independence with ADLs.    Baseline Met    Time 3    Period Days    Status Achieved      PT SHORT TERM GOAL #2   Title Pt will be able to complete 6 MWT without requiring rest break or sitting in order to indicate ipmroved aerobic and muscular endurance for everyday tasks.    Baseline 835 feet with 3 wheel walker at PN 9/8    Time 4    Period Weeks    Status Achieved      PT SHORT TERM GOAL #3   Title Patient will  complete five times sit to stand test in < 25 seconds indicating an increased LE strength and improved balance.    Baseline 16 sec 9/8 with UE use    Time 4    Period Weeks    Status Achieved    Target Date 06/18/21               PT Long Term Goals - 10/02/21 1027       PT LONG TERM GOAL #1   Title Pt will improve FOTO score to 60 in order to indicate improvement with everyday tasks    Baseline FOTO: 58 om 9/8 (met initial foto goal), 1025: 58% 12/8: 57%, 12/20: 60%    Time 12    Period Weeks    Status Achieved    Target Date 12/13/21      PT LONG TERM GOAL #2   Title Patient will complete five times sit to stand test in < 13 seconds indicating an increased LE strength and improved balance.    Baseline 5XSTS in 16 sec with UE use 06/21/21, 10/25: 18 sec with UE use, 15.1 sec on 12/8, 12/19: 14 sec with 1 UE assist;    Time 8    Period Weeks    Status Partially Met    Target Date 11/15/21      PT LONG TERM GOAL #3   Title Patient will increase six minute walk test distance to >900 feet for progression to improve gait ability and safety with ambulation    Baseline 10/25: 650 feet with walker, 12/8: 825 with 3WW, 12/19: 715 feet with 3 wheeled walker    Time 8    Period Weeks    Status On-going    Target Date 09/18/21      PT LONG TERM GOAL #4   Title Pt will safely demonstrate ability to navigate up and down 4 stairs  without assistance in order to improve ability to navigate stairs in her daily life    Baseline able to ascend 1 stair but too reports fear of descending more than 1 stair, 12/8: not assessed, 12/20: not assessed;    Time 6    Period Weeks    Status Not Met    Target Date 09/18/21                   Plan - 11/08/21 1052     Clinical Impression Statement Patient motivated and participated well within session. She tolerated stretches and soft tissue massage well reporting a reduction in pain and exhibiting better tissue extensibility and ROM. She  exhibits less tightness today compared to previous session, but still has significant stiffness. Patient was instructed in advanced balance tasks. She is hesitant to reduce rail assist with heavy fear of falling. She also exhibits decreased ankle strategies often relying on trunk/hip strategies for balance. She would benefit from additional skilled PT Intervention to improve ROM/strength and balance; Patient expressed desire to improve ability to walk up incline in order to get her mail;    Personal Factors and Comorbidities Age;Comorbidity 1;Transportation    Examination-Activity Limitations Bend;Carry;Squat;Stairs    Examination-Participation Restrictions Cleaning;Laundry    Stability/Clinical Decision Making Evolving/Moderate complexity    Rehab Potential Good    PT Frequency 2x / week    PT Duration 6 weeks    PT Treatment/Interventions ADLs/Self Care Home Management;Aquatic Therapy;Electrical Stimulation;Gait training;Stair training;Functional mobility training;Therapeutic activities;Therapeutic exercise;Balance training;Neuromuscular re-education;Patient/family education;Prosthetic Training;Manual techniques;Passive range of motion;Energy conservation    PT Next Visit Plan attention to objects on her R side via coordination and balance tasks.    PT Home Exercise Plan No updates to HEP this date    Consulted and Agree with Plan of Care Patient             Patient will benefit from skilled therapeutic intervention in order to improve the following deficits and impairments:  Abnormal gait, Decreased balance, Decreased endurance, Decreased mobility, Decreased activity tolerance  Visit Diagnosis: Pain in left lower leg  Difficulty in walking, not elsewhere classified  Unsteadiness on feet  Abnormality of gait and mobility  Muscle weakness (generalized)     Problem List Patient Active Problem List   Diagnosis Date Noted   Primary localized osteoarthritis of left hip 03/30/2019    Status post left hip replacement 03/30/2019    Danalee Flath, PT, DPT 11/08/2021, 10:54 AM  [facility] [address] [place], [place], [address] Phone: [phone]   Fax:  [phone]  Name: KANDISS IHRIG MRN: [id number] Date of Birth: July 16, 1946

## 2021-11-13 ENCOUNTER — Ambulatory Visit: Payer: Medicare Other | Admitting: Physical Therapy

## 2021-11-13 ENCOUNTER — Other Ambulatory Visit: Payer: Self-pay

## 2021-11-13 DIAGNOSIS — R2681 Unsteadiness on feet: Secondary | ICD-10-CM

## 2021-11-13 DIAGNOSIS — R262 Difficulty in walking, not elsewhere classified: Secondary | ICD-10-CM | POA: Diagnosis not present

## 2021-11-13 DIAGNOSIS — M79662 Pain in left lower leg: Secondary | ICD-10-CM | POA: Diagnosis not present

## 2021-11-13 DIAGNOSIS — R278 Other lack of coordination: Secondary | ICD-10-CM | POA: Diagnosis not present

## 2021-11-13 DIAGNOSIS — R2689 Other abnormalities of gait and mobility: Secondary | ICD-10-CM

## 2021-11-13 DIAGNOSIS — M6281 Muscle weakness (generalized): Secondary | ICD-10-CM

## 2021-11-13 DIAGNOSIS — R269 Unspecified abnormalities of gait and mobility: Secondary | ICD-10-CM

## 2021-11-13 DIAGNOSIS — Z9181 History of falling: Secondary | ICD-10-CM

## 2021-11-13 NOTE — Therapy (Signed)
[place] MAIN [facility] SERVICES [address] [place], [place], [address] Phone: [phone]   Fax:  [phone]  Physical Therapy Treatment  Patient Details  Name: [name] MRN: [id number] Date of Birth: 03/26/1946 Referring Provider (PT): Merlene Pulling PA-C   Encounter Date: 11/13/2021   PT End of Session - 11/13/21 0958     Visit Number 39    Number of Visits 26    Date for PT Re-Evaluation 12/13/21    Authorization Time Period recert 14/7/09-11/23/55    Progress Note Due on Visit 40    PT Start Time 0948    PT Stop Time 1040    PT Time Calculation (min) 52 min    Equipment Utilized During Treatment Gait belt    Activity Tolerance Patient tolerated treatment well;No increased pain    Behavior During Therapy WFL for tasks assessed/performed             Past Medical History:  Diagnosis Date   Allergic rhinitis    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   Heart murmur    mild mitral regurg-echo 2010   Hyperlipidemia    Hypertension    Lupus anticoagulant positive    Obesity    Pre-diabetes    Primary localized osteoarthritis of left hip 03/30/2019   Stroke [facility]) 2010   Some right sided weakness.  Gait issues.   Wears dentures    partial upper    Past Surgical History:  Procedure Laterality Date   ABDOMINAL HYSTERECTOMY  1981   APPENDECTOMY     AXILLARY LYMPH NODE BIOPSY Left 05/16/2014   Procedure: EXCISION 2CM LEFT AXILLARY MASS;  Surgeon: [name], MD;  Location: [place];  Service: General;  Laterality: Left;  Left axillary sebaceous cyst excision   CATARACT EXTRACTION W/PHACO Left 04/11/2020   Procedure: CATARACT EXTRACTION PHACO AND INTRAOCULAR LENS PLACEMENT  ([place]) LEFT DIABETIC TORIC LENS  6.05 00:34.9;  Surgeon: [name], MD;  Location: [place];  Service: Ophthalmology;  Laterality: Left;   CATARACT EXTRACTION W/PHACO Right 12/12/2020   Procedure: CATARACT EXTRACTION PHACO AND INTRAOCULAR LENS PLACEMENT ([place]) RIGHT TORIC LENS;  Surgeon: [name], MD;  Location: [place];  Service: Ophthalmology;  Laterality: Right;  6.55 0:41.1   DILATION AND CURETTAGE OF UTERUS     SKIN CANCER EXCISION  2016   anlke   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   Procedure: TOTAL HIP ARTHROPLASTY;  Surgeon: [name], MD;  [facility];  Service: Orthopedics;  Laterality: Left;    There were no vitals filed for this visit.   Subjective Assessment - 11/13/21 1215     Subjective Patient reports continued mild soreness in LLE as well as stiffness. No new changes since last session; She has had some family issues going on which has been concerning for her.    Pertinent History Pt reports she had a stroke in 2010 and was in inpatient rehab for 4 weeks. Pt reports she has been to therapy about one time a year since the onset of her stroke for various ailments including L THA in 2020. Pt reports she has bioness L300 which improved her drop foot on her R ankle. Pt  has depended on her L LE since the stroke due to her weakness and impaired funciton on the right LE. Since the fall she has had throbbing pain in the left LE which is exacerbated with increased weightbearing activity. Pt reports she has had x rays and it shows nothing is broken but despite this she has had continued pain in the left. Pt reports pain is on the lateral aspect of her left LE up into her posterior thigh. Pt reports doctor thinks this could be from nerve related pain but will need PT in order for her insurance to cover MRI of the lumbar spine. Prior to this fall she fell and broke her right wrist.  Pt reports she can perform any activities above the level of the  wast but she is unable to bend over to complete tasks. Pt reports she has multiple wakers, 3 wheel, 4 wheel, wheelchair, standard rolling walker. Pt had brain MRI following fall and no reports of bleeding or issues.    Limitations Walking    How long can you sit comfortably? a couple hours due to low back pain    How long can you stand comfortably? 15 minutes    How long can you walk comfortably? Able to walk around [employer] but was very fatigued following    Currently in Pain? Yes    Pain Score 3     Pain Location Leg    Pain Orientation Left;Lower    Pain Descriptors / Indicators Aching;Sore    Pain Type Chronic pain    Pain Onset More than a month ago    Pain Frequency Intermittent    Aggravating Factors  prolonged standing/walking    Pain Relieving Factors PT intervention    Effect of Pain on Daily Activities decreased activity tolerance;    Multiple Pain Sites No                   TREATMENT:  Hook[place]:   PT performed passive SLR hamstring stretch 30 sec hold x2 reps Progressed to SLR hamstring stretch with ankle DF/PF neural flossing x10 reps LLE only; PT performed passive single knee to chest stretch 20 sec hold x2 reps each LE PT performed passive piriformis stretch on LLE with moderate tightness noted 30 sec hold x1 reps; Patient exhibits increased tightness in LLE hip being unable to achieve hip ER; PT instructed patient in contract/relax to facilitate better hip movement, contract isometric hip IR 5 sec hold, relax into hip ER, x4 reps to facilitate better hip ROM;    Instructed patient in right sidelying PT performed extensive soft tissue massage to left piriformis x69mn utilizing rolling stick; Patient exhibits moderate stiffness. PT performed ischemic trigger point release to left piriformis 30 sec hold x2 reps; She continues to have moderate tenderness along left piriformis but exhibits less tightness compared to previous sessions; Following manual therapy  patient exhibits better tissue extensibility with less tightness and less trigger points;      Patient reports moderate discomfort which was alleviated following soft tissue massage;   Patient in Right sidelying: PT Performed passive LLE hip flexor stretch 30 sec hold x1 reps; PT Performed passive LLE single knee to chest 30 sec hold x1 rep;    Instructed patient in supine PT Performed passive SLR hamstring stretch 30 sec hold x1 rep LLE with neural flossing PT performed passive piriformis stretch with better ROM noted LLE 30 sec hold x2 reps;   Patient instructed in standing and gait x150 feet  with 3 wheeled walker; She was able to exhibit increased step length and reports significant reduction in pain and stiffness; She reports a little stiffness in right anterior hip and 2/10 LLE lower leg soreness;   Instructed patient in standing RLE hip flexor stretch 20 sec hold x2 reps; Patient reports minimal stretch and had moderate difficulty achieving position.    NMR: Patient instructed in balance exercise: Standing on firm surface:  -unsupported standing              Head turns side/side x5 reps, no difficulty             Progressed to arms across chest trunk rotation x5 reps each with close supervision and cues to increase rotation to challenge stance control;   Standing beside support: -forward/backward step with single leg x5 reps with 1 rail assist Attempted forward/backward step unsupported, patient has significant difficulty reducing rail assist especially when stepping with LLE with decreased weight shift to RLE Instructed patient in forward step when holding small ball to challenge dynamic balance, patient unable due to high fear of falling  Sitting in chair: Attempted sit<>Stand unsupported, patient unable Instructed patient in standing with small ball with eccentric lower unsupported, patient hesitant often reaching out for walker Patient exhibits increased posterior trunk lean  with increased fear of falling leading to difficulty transferring out of chair; PT instructed patient in forward weight shift x5 reps x2 sets with cues for increased forward reach. She denies any pain or difficulty. When attempted sit<>Stand unsupported, she reverts back to posterior lean despite therapist offering HHA Patient was able to stand with little to no difficulty when reaching for walker  Concerned patient's fear of falling is her biggest limitation for mobility. Consider working on reducing fear of falling and fear avoidance to improve mobility;    She tolerated session well, reporting less pain; She does report increased fatigue with advanced exercise.                            PT Education - 11/13/21 1216     Education Details positioning/balance;    Person(s) Educated Patient    Methods Explanation;Verbal cues    Comprehension Verbalized understanding;Returned demonstration;Verbal cues required;Need further instruction              PT Short Term Goals - 10/02/21 1027       PT SHORT TERM GOAL #1   Title Patient will be independent in home exercise program to improve strength/mobility for better functional independence with ADLs.    Baseline Met    Time 3    Period Days    Status Achieved      PT SHORT TERM GOAL #2   Title Pt will be able to complete 6 MWT without requiring rest break or sitting in order to indicate ipmroved aerobic and muscular endurance for everyday tasks.    Baseline 835 feet with 3 wheel walker at PN 9/8    Time 4    Period Weeks    Status Achieved      PT SHORT TERM GOAL #3   Title Patient will complete five times sit to stand test in < 25 seconds indicating an increased LE strength and improved balance.    Baseline 16 sec 9/8 with UE use    Time 4    Period Weeks    Status Achieved    Target Date 06/18/21  PT Long Term Goals - 10/02/21 1027       PT LONG TERM GOAL #1   Title Pt will  improve FOTO score to 60 in order to indicate improvement with everyday tasks    Baseline FOTO: 58 om 9/8 (met initial foto goal), 1025: 58% 12/8: 57%, 12/20: 60%    Time 12    Period Weeks    Status Achieved    Target Date 12/13/21      PT LONG TERM GOAL #2   Title Patient will complete five times sit to stand test in < 13 seconds indicating an increased LE strength and improved balance.    Baseline 5XSTS in 16 sec with UE use 06/21/21, 10/25: 18 sec with UE use, 15.1 sec on 12/8, 12/19: 14 sec with 1 UE assist;    Time 8    Period Weeks    Status Partially Met    Target Date 11/15/21      PT LONG TERM GOAL #3   Title Patient will increase six minute walk test distance to >900 feet for progression to improve gait ability and safety with ambulation    Baseline 10/25: 650 feet with walker, 12/8: 825 with 3WW, 12/19: 715 feet with 3 wheeled walker    Time 8    Period Weeks    Status On-going    Target Date 09/18/21      PT LONG TERM GOAL #4   Title Pt will safely demonstrate ability to navigate up and down 4 stairs without assistance in order to improve ability to navigate stairs in her daily life    Baseline able to ascend 1 stair but too reports fear of descending more than 1 stair, 12/8: not assessed, 12/20: not assessed;    Time 6    Period Weeks    Status Not Met    Target Date 09/18/21                   Plan - 11/13/21 1217     Clinical Impression Statement Patient motivated and participated well within session. She continues to respond well to soft tissue mobilization and stretches to improve tissue extensibility and reduce pain. she continues to have tightness/stiffness in LLE hip which limits mobility. She was instructed in advanced balance tasks to challenge stance and dynamic movement. Patient exhibits a high fear of falling and exhibits impaired mechanics and inability to complete tasks with reduced HHA. she would benefit from additional skilled intervention to  reduce fear of falling and reduce fear avoidance.    Personal Factors and Comorbidities Age;Comorbidity 1;Transportation    Examination-Activity Limitations Bend;Carry;Squat;Stairs    Examination-Participation Restrictions Cleaning;Laundry    Stability/Clinical Decision Making Evolving/Moderate complexity    Rehab Potential Good    PT Frequency 2x / week    PT Duration 6 weeks    PT Treatment/Interventions ADLs/Self Care Home Management;Aquatic Therapy;Electrical Stimulation;Gait training;Stair training;Functional mobility training;Therapeutic activities;Therapeutic exercise;Balance training;Neuromuscular re-education;Patient/family education;Prosthetic Training;Manual techniques;Passive range of motion;Energy conservation    PT Next Visit Plan attention to objects on her R side via coordination and balance tasks.    PT Home Exercise Plan No updates to HEP this date    Consulted and Agree with Plan of Care Patient             Patient will benefit from skilled therapeutic intervention in order to improve the following deficits and impairments:  Abnormal gait, Decreased balance, Decreased endurance, Decreased mobility, Decreased activity tolerance  Visit Diagnosis: Pain in  left lower leg  Difficulty in walking, not elsewhere classified  Unsteadiness on feet  Abnormality of gait and mobility  Muscle weakness (generalized)  Other lack of coordination  Other abnormalities of gait and mobility  History of falling     Problem List Patient Active Problem List   Diagnosis Date Noted   Primary localized osteoarthritis of left hip 03/30/2019   Status post left hip replacement 03/30/2019    Vincentina Sollers, PT, DPT 11/13/2021, 12:22 PM  [facility] [address] [place], [place], [address] Phone: [phone]   Fax:  [phone]  Name: [name] MRN: [id number] Date of Birth: 1946/01/05

## 2021-11-15 ENCOUNTER — Ambulatory Visit: Payer: Medicare Other | Admitting: Physical Therapy

## 2021-11-20 ENCOUNTER — Ambulatory Visit: Payer: Medicare Other | Attending: Surgical

## 2021-11-20 ENCOUNTER — Other Ambulatory Visit: Payer: Self-pay

## 2021-11-20 DIAGNOSIS — R269 Unspecified abnormalities of gait and mobility: Secondary | ICD-10-CM | POA: Diagnosis not present

## 2021-11-20 DIAGNOSIS — M6281 Muscle weakness (generalized): Secondary | ICD-10-CM | POA: Insufficient documentation

## 2021-11-20 DIAGNOSIS — M79605 Pain in left leg: Secondary | ICD-10-CM | POA: Diagnosis not present

## 2021-11-20 DIAGNOSIS — R2681 Unsteadiness on feet: Secondary | ICD-10-CM | POA: Insufficient documentation

## 2021-11-20 DIAGNOSIS — R262 Difficulty in walking, not elsewhere classified: Secondary | ICD-10-CM | POA: Diagnosis not present

## 2021-11-20 DIAGNOSIS — R2689 Other abnormalities of gait and mobility: Secondary | ICD-10-CM | POA: Diagnosis not present

## 2021-11-20 NOTE — Therapy (Signed)
[place] MAIN [facility] SERVICES [address] [place], [place], [address] Phone: [phone]   Fax:  [phone]  Physical Therapy Treatment/Physical Therapy Progress Note   Dates of reporting period  10/02/2021   to   11/20/2021   Patient Details  Name: [name] MRN: [id number] Date of Birth: September 05, 1946 Referring Provider (PT): Merlene Pulling PA-C   Encounter Date: 11/20/2021   PT End of Session - 11/20/21 0818     Visit Number 40    Number of Visits 40    Date for PT Re-Evaluation 12/13/21    Authorization Time Period recert 41/9/62-11/16/95    Progress Note Due on Visit 40    PT Start Time 0816    PT Stop Time 0845    PT Time Calculation (min) 29 min    Equipment Utilized During Treatment Gait belt    Activity Tolerance Patient tolerated treatment well;No increased pain    Behavior During Therapy WFL for tasks assessed/performed             Past Medical History:  Diagnosis Date   Allergic rhinitis    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   Heart murmur    mild mitral regurg-echo 2010   Hyperlipidemia    Hypertension    Lupus anticoagulant positive    Obesity    Pre-diabetes    Primary localized osteoarthritis of left hip 03/30/2019   Stroke [facility]) 2010   Some right sided weakness.  Gait issues.   Wears dentures    partial upper    Past Surgical History:  Procedure Laterality Date   ABDOMINAL HYSTERECTOMY  1981   APPENDECTOMY     AXILLARY LYMPH NODE BIOPSY Left 05/16/2014   Procedure: EXCISION 2CM LEFT AXILLARY MASS;  Surgeon: [name], MD;  Location: [place];  Service: General;  Laterality: Left;  Left axillary sebaceous cyst excision   CATARACT EXTRACTION  W/PHACO Left 04/11/2020   Procedure: CATARACT EXTRACTION PHACO AND INTRAOCULAR LENS PLACEMENT ([place]) LEFT DIABETIC TORIC LENS  6.05 00:34.9;  Surgeon: [name], MD;  Location: [place];  Service: Ophthalmology;  Laterality: Left;   CATARACT EXTRACTION W/PHACO Right 12/12/2020   Procedure: CATARACT EXTRACTION PHACO AND INTRAOCULAR LENS PLACEMENT ([place]) RIGHT TORIC LENS;  Surgeon: [name], MD;  Location: [place];  Service: Ophthalmology;  Laterality: Right;  6.55 0:41.1   DILATION AND CURETTAGE OF UTERUS     SKIN CANCER EXCISION  2016   anl[place]   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   Procedure: TOTAL HIP ARTHROPLASTY;  Surgeon: [name], MD;  [facility];  Service: Orthopedics;  Laterality: Left;    There were no vitals filed for this visit.   Subjective Assessment - 11/20/21 0816     Subjective Pt reports she feels rattled currently because she is not used to early morning appointments and that it is difficult getting here early due to her mobility. When asked about pain pt reports "nothing to really report."    Pertinent History Pt reports she had a stroke in 2010 and was in inpatient rehab for 4 weeks. Pt reports she has been to therapy about one time a year since the onset of her stroke  for various ailments including L THA in 2020. Pt reports she has bioness L300 which improved her drop foot on her R ankle. Pt has depended on her L LE since the stroke due to her weakness and impaired funciton on the right LE. Since the fall she has had throbbing pain in the left LE which is exacerbated with increased weightbearing activity. Pt reports she has had x rays and it shows nothing is broken but despite this she has had continued pain in the left. Pt reports pain is on the lateral aspect of her left LE up into her posterior thigh. Pt reports doctor thinks this could be from nerve related pain but will need PT in order for her insurance to cover MRI of the  lumbar spine. Prior to this fall she fell and broke her right wrist.  Pt reports she can perform any activities above the level of the [place] but she is unable to bend over to complete tasks. Pt reports she has multiple wakers, 3 wheel, 4 wheel, wheelchair, standard rolling walker. Pt had brain MRI following fall and no reports of bleeding or issues.    Limitations Walking    How long can you sit comfortably? a couple hours due to low back pain    How long can you stand comfortably? 15 minutes    How long can you walk comfortably? Able to walk around [employer] but was very fatigued following    Currently in Pain? No/denies    Pain Onset More than a month ago            INTERVENTIONS - Goals reassessed for progress note, see goal section for details  5xSTS - 11.75 sec, 1 UE assist FOTO - 56% 6MWT - 670 ft with 3WW  Ascending/descending stairs:  Pt ascends/descends with BUE support on rails and step-to pattern both ways, CGA provided   Pt educated throughout session about proper posture and technique with exercises. Improved exercise technique, movement at target joints, use of target muscles after min to mod verbal, visual, tactile cues.  PT Education - 11/20/21 0819     Education Details reassessment findings, indications for PT/POC    Person(s) Educated Patient    Methods Explanation    Comprehension Verbalized understanding              PT Short Term Goals - 11/20/21 0820       PT SHORT TERM GOAL #1   Title Patient will be independent in home exercise program to improve strength/mobility for better functional independence with ADLs.    Baseline Met    Time 3    Period Days    Status Achieved      PT SHORT TERM GOAL #2   Title Pt will be able to complete 6 MWT without requiring rest break or sitting in order to indicate ipmroved aerobic and muscular endurance for everyday tasks.    Baseline 835 feet with 3 wheel walker at PN 9/8    Time 4    Period Weeks    Status  Achieved      PT SHORT TERM GOAL #3   Title Patient will complete five times sit to stand test in < 25 seconds indicating an increased LE strength and improved balance.    Baseline 16 sec 9/8 with UE use    Time 4    Period Weeks    Status Achieved    Target Date 06/18/21  PT Long Term Goals - 11/20/21 2774       PT LONG TERM GOAL #1   Title Pt will improve FOTO score to 60 in order to indicate improvement with everyday tasks    Baseline FOTO: 58 om 9/8 (met initial foto goal), 1025: 58% 12/8: 57%, 12/20: 60% 2/7: 56% (previously achieve)    Time 12    Period Weeks    Status Achieved    Target Date 12/13/21      PT LONG TERM GOAL #2   Title Patient will complete five times sit to stand test in < 13 seconds indicating an increased LE strength and improved balance.    Baseline 5XSTS in 16 sec with UE use 06/21/21, 10/25: 18 sec with UE use, 15.1 sec on 12/8, 12/19: 14 sec with 1 UE assist; 2/7: 11.75 sec with 1 UE    Time 8    Period Weeks    Status Achieved    Target Date 11/15/21      PT LONG TERM GOAL #3   Title Patient will increase six minute walk test distance to >900 feet for progression to improve gait ability and safety with ambulation    Baseline 10/25: 650 feet with walker, 12/8: 825 with 3WW, 12/19: 715 feet with 3 wheeled walker; 2/7: 670 ft with 3 wheeled walker    Time 8    Period Weeks    Status On-going    Target Date 01/01/22      PT LONG TERM GOAL #4   Title Pt will safely demonstrate ability to navigate up and down 4 stairs without assistance in order to improve ability to navigate stairs in her daily life    Baseline able to ascend 1 stair but too reports fear of descending more than 1 stair, 12/8: not assessed, 12/20: not assessed; 2/7:  Pt ascends/descends with BUE support on rails and step-to pattern both ways, CGA provided    Time 6    Period Weeks    Status Achieved    Target Date 01/01/22                   Plan -  11/20/21 1035     Clinical Impression Statement Session limited due to pt late arrival. Goals reassessed for progress note. Pt achieved two of her goals: 5xSTS and stair goal, indicating decreased fall risk and improved BLE strength and functoinal mobility. While pt shows progress, pt with slight decrease in performance on 6MWT and FOTO compared to prior assessment. Patient's condition has the potential to improve in response to therapy. Maximum improvement is yet to be obtained. The anticipated improvement is attainable and reasonable in a generally predictable time. The pt will benefit from further skilled PT to continue to improve strength, functional mobility, gait ability/capacity and to decrease fall risk.    Personal Factors and Comorbidities Age;Comorbidity 1;Transportation    Examination-Activity Limitations Bend;Carry;Squat;Stairs    Examination-Participation Restrictions Cleaning;Laundry    Stability/Clinical Decision Making Evolving/Moderate complexity    Rehab Potential Good    PT Frequency 2x / week    PT Duration 6 weeks    PT Treatment/Interventions ADLs/Self Care Home Management;Aquatic Therapy;Electrical Stimulation;Gait training;Stair training;Functional mobility training;Therapeutic activities;Therapeutic exercise;Balance training;Neuromuscular re-education;Patient/family education;Prosthetic Training;Manual techniques;Passive range of motion;Energy conservation    PT Next Visit Plan attention to objects on her R side via coordination and balance tasks.    PT Home Exercise Plan No updates to HEP this date    Consulted and Agree with  Plan of Care Patient             Patient will benefit from skilled therapeutic intervention in order to improve the following deficits and impairments:  Abnormal gait, Decreased balance, Decreased endurance, Decreased mobility, Decreased activity tolerance  Visit Diagnosis: Other abnormalities of gait and mobility  Muscle weakness  (generalized)  Unsteadiness on feet     Problem List Patient Active Problem List   Diagnosis Date Noted   Primary localized osteoarthritis of left hip 03/30/2019   Status post left hip replacement 03/30/2019    Zollie Pee, PT 11/20/2021, 10:42 AM  [facility] [address] [place], [place], [address] Phone: [phone]   Fax:  [phone]  Name: [name] MRN: [id number] Date of Birth: [birth year]

## 2021-11-22 ENCOUNTER — Ambulatory Visit: Payer: Medicare Other

## 2021-11-22 ENCOUNTER — Other Ambulatory Visit: Payer: Self-pay

## 2021-11-22 DIAGNOSIS — R262 Difficulty in walking, not elsewhere classified: Secondary | ICD-10-CM | POA: Diagnosis not present

## 2021-11-22 DIAGNOSIS — R2681 Unsteadiness on feet: Secondary | ICD-10-CM

## 2021-11-22 DIAGNOSIS — M79605 Pain in left leg: Secondary | ICD-10-CM

## 2021-11-22 DIAGNOSIS — M6281 Muscle weakness (generalized): Secondary | ICD-10-CM

## 2021-11-22 DIAGNOSIS — R2689 Other abnormalities of gait and mobility: Secondary | ICD-10-CM | POA: Diagnosis not present

## 2021-11-22 DIAGNOSIS — R269 Unspecified abnormalities of gait and mobility: Secondary | ICD-10-CM

## 2021-11-22 NOTE — Therapy (Signed)
[place] MAIN [facility] SERVICES [address] [place], [place], [address] Phone: [phone]   Fax:  [phone]  Physical Therapy Treatment  Patient Details  Name: [name] MRN: [id number] Date of Birth: 1946-04-08 Referring Provider (PT): Merlene Pulling PA-C   Encounter Date: 11/22/2021   PT End of Session - 11/22/21 1539     Visit Number 41    Number of Visits 24    Date for PT Re-Evaluation 12/13/21    Authorization Time Period recert 83/3/82-5/0/53    Progress Note Due on Visit 40    PT Start Time 0934    PT Stop Time 1014    PT Time Calculation (min) 40 min    Equipment Utilized During Treatment Gait belt    Activity Tolerance Patient tolerated treatment well;No increased pain    Behavior During Therapy WFL for tasks assessed/performed             Past Medical History:  Diagnosis Date   Allergic rhinitis    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)    Family history of adverse reaction to anesthesia    Daughter was "paralyzed" for several days after 1 surgery.   Heart murmur    mild mitral regurg-echo 2010   Hyperlipidemia    Hypertension    Lupus anticoagulant positive    Obesity    Pre-diabetes    Primary localized osteoarthritis of left hip 03/30/2019   Stroke [facility]) 2010   Some right sided weakness.  Gait issues.   Wears dentures    partial upper    Past Surgical History:  Procedure Laterality Date   ABDOMINAL HYSTERECTOMY  1981   APPENDECTOMY     AXILLARY LYMPH NODE BIOPSY Left 05/16/2014   Procedure: EXCISION 2CM LEFT AXILLARY MASS;  Surgeon: [name], MD;  Location: [place];  Service: General;  Laterality: Left;  Left axillary sebaceous cyst excision   CATARACT EXTRACTION W/PHACO Left 04/11/2020   Procedure: CATARACT EXTRACTION PHACO AND INTRAOCULAR LENS PLACEMENT  ([place]) LEFT DIABETIC TORIC LENS  6.05 00:34.9;  Surgeon: [name], MD;  Location: [place];  Service: Ophthalmology;  Laterality: Left;   CATARACT EXTRACTION W/PHACO Right 12/12/2020   Procedure: CATARACT EXTRACTION PHACO AND INTRAOCULAR LENS PLACEMENT ([place]) RIGHT TORIC LENS;  Surgeon: [name], MD;  Location: [place];  Service: Ophthalmology;  Laterality: Right;  6.55 0:41.1   DILATION AND CURETTAGE OF UTERUS     SKIN CANCER EXCISION  2016   [place]   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   Procedure: TOTAL HIP ARTHROPLASTY;  Surgeon: [name], MD;  [facility];  Service: Orthopedics;  Laterality: Left;    There were no vitals filed for this visit.   Subjective Assessment - 11/22/21 1258     Subjective Patient reports left hip is more sore/tight due to going to her grandaughters basketball games and sitting on the bleachers.    Pertinent History Pt reports she had a stroke in 2010 and was in inpatient rehab for 4 weeks. Pt reports she has been to therapy about one time a year since the onset of her stroke for various ailments including L THA in 2020. Pt reports she has bioness L300 which improved her drop foot on her R ankle. Pt has depended on her L LE since the stroke due to  her weakness and impaired funciton on the right LE. Since the fall she has had throbbing pain in the left LE which is exacerbated with increased weightbearing activity. Pt reports she has had x rays and it shows nothing is broken but despite this she has had continued pain in the left. Pt reports pain is on the lateral aspect of her left LE up into her posterior thigh. Pt reports doctor thinks this could be from nerve related pain but will need PT in order for her insurance to cover MRI of the lumbar spine. Prior to this fall she fell and broke her right wrist.  Pt reports she can perform any activities above the level of the wast but she is unable to bend over to complete tasks.  Pt reports she has multiple wakers, 3 wheel, 4 wheel, wheelchair, standard rolling walker. Pt had brain MRI following fall and no reports of bleeding or issues.    Limitations Walking    How long can you sit comfortably? a couple hours due to low back pain    How long can you stand comfortably? 15 minutes    How long can you walk comfortably? Able to walk around [employer] but was very fatigued following    Currently in Pain? Yes    Pain Location Hip    Pain Orientation Left;Lower    Pain Descriptors / Indicators Aching;Sore    Pain Type Chronic pain    Pain Onset More than a month ago    Pain Frequency Intermittent    Aggravating Factors  Prolonged standing/walking    Pain Relieving Factors manual therapy techniques in session    Effect of Pain on Daily Activities Difficulty walking and performing ADL's    Multiple Pain Sites No             TREATMENT:   Patient ambulated 150 feet using 3WW (timed- 1 min 17 sec and 136 total steps before manual therapy)    Manual therapy   PT performed passive SLR hamstring stretch 30 sec hold x4 sets Progressed to SLR hamstring stretch with ankle DF/PF neural flossing 2 sets x10 reps LLE only; PT performed passive single knee to chest stretch 20 sec hold x3 sets Left LE PT performed passive piriformis stretch on LLE with increased tightness noted 30 sec hold x 4 sets    Instructed patient in right sidelying STM to left piriformis x 12 min utilizing rolling stick; Patient exhibits tightness in piriformis release and  PT performed ischemic trigger point release to left piriformis 30 sec hold x2 reps; Following manual therapy patient exhibits better tissue extensibility with less tightness and less trigger points;       Post manual therapy- patient ambulated the same distance 150 feet using 3WW and performed in 60.07 sec and 110 total steps.                              PT Education - 11/22/21 1538     Education  Details Exercise technique; Effect of Manual therapy to improve her tightness to improve her gait quality.    Person(s) Educated Patient    Methods Explanation;Demonstration;Tactile cues;Verbal cues    Comprehension Verbalized understanding;Returned demonstration;Verbal cues required;Tactile cues required;Need further instruction              PT Short Term Goals - 11/20/21 0820       PT SHORT TERM GOAL #1   Title Patient will  be independent in home exercise program to improve strength/mobility for better functional independence with ADLs.    Baseline Met    Time 3    Period Days    Status Achieved      PT SHORT TERM GOAL #2   Title Pt will be able to complete 6 MWT without requiring rest break or sitting in order to indicate ipmroved aerobic and muscular endurance for everyday tasks.    Baseline 835 feet with 3 wheel walker at PN 9/8    Time 4    Period Weeks    Status Achieved      PT SHORT TERM GOAL #3   Title Patient will complete five times sit to stand test in < 25 seconds indicating an increased LE strength and improved balance.    Baseline 16 sec 9/8 with UE use    Time 4    Period Weeks    Status Achieved    Target Date 06/18/21               PT Long Term Goals - 11/20/21 0821       PT LONG TERM GOAL #1   Title Pt will improve FOTO score to 60 in order to indicate improvement with everyday tasks    Baseline FOTO: 58 om 9/8 (met initial foto goal), 1025: 58% 12/8: 57%, 12/20: 60% 2/7: 56% (previously achieve)    Time 12    Period Weeks    Status Achieved    Target Date 12/13/21      PT LONG TERM GOAL #2   Title Patient will complete five times sit to stand test in < 13 seconds indicating an increased LE strength and improved balance.    Baseline 5XSTS in 16 sec with UE use 06/21/21, 10/25: 18 sec with UE use, 15.1 sec on 12/8, 12/19: 14 sec with 1 UE assist; 2/7: 11.75 sec with 1 UE    Time 8    Period Weeks    Status Achieved    Target Date 11/15/21       PT LONG TERM GOAL #3   Title Patient will increase six minute walk test distance to >900 feet for progression to improve gait ability and safety with ambulation    Baseline 10/25: 650 feet with walker, 12/8: 825 with 3WW, 12/19: 715 feet with 3 wheeled walker; 2/7: 670 ft with 3 wheeled walker    Time 8    Period Weeks    Status On-going    Target Date 01/01/22      PT LONG TERM GOAL #4   Title Pt will safely demonstrate ability to navigate up and down 4 stairs without assistance in order to improve ability to navigate stairs in her daily life    Baseline able to ascend 1 stair but too reports fear of descending more than 1 stair, 12/8: not assessed, 12/20: not assessed; 2/7:  Pt ascends/descends with BUE support on rails and step-to pattern both ways, CGA provided    Time 6    Period Weeks    Status Achieved    Target Date 01/01/22                   Plan - 11/22/21 1540     Clinical Impression Statement Patient presented today with good motivation but increased overall stiffness patient relates to sitting prolonged at a basketball game on hard bleacher. She responded very well to treatment today - able to loosen up with left LE  and able to demo much improved gait efficiency after treatment- demo improved step length and decreased time vs. Intial time measure prior to manual treatment. The pt will benefit from further skilled PT to continue to improve strength, functional mobility, gait ability/capacity and to decrease fall risk    Personal Factors and Comorbidities Age;Comorbidity 1;Transportation    Examination-Activity Limitations Bend;Carry;Squat;Stairs    Examination-Participation Restrictions Cleaning;Laundry    Stability/Clinical Decision Making Evolving/Moderate complexity    Rehab Potential Good    PT Frequency 2x / week    PT Duration 6 weeks    PT Treatment/Interventions ADLs/Self Care Home Management;Aquatic Therapy;Electrical Stimulation;Gait training;Stair  training;Functional mobility training;Therapeutic activities;Therapeutic exercise;Balance training;Neuromuscular re-education;Patient/family education;Prosthetic Training;Manual techniques;Passive range of motion;Energy conservation    PT Next Visit Plan attention to objects on her R side via coordination and balance tasks.    PT Home Exercise Plan No updates to HEP this date    Consulted and Agree with Plan of Care Patient             Patient will benefit from skilled therapeutic intervention in order to improve the following deficits and impairments:  Abnormal gait, Decreased balance, Decreased endurance, Decreased mobility, Decreased activity tolerance  Visit Diagnosis: Pain in left leg  Muscle weakness (generalized)  Unsteadiness on feet  Difficulty in walking, not elsewhere classified  Abnormality of gait and mobility     Problem List Patient Active Problem List   Diagnosis Date Noted   Primary localized osteoarthritis of left hip 03/30/2019   Status post left hip replacement 03/30/2019    Lewis Moccasin, PT 11/22/2021, 6:13 PM  [place] MAIN [facility] SERVICES [address] [place], [place], [address] Phone: [phone]   Fax:  [phone]  Name: [name] MRN: [id number] Date of Birth: April 10, 1946

## 2021-11-27 ENCOUNTER — Ambulatory Visit: Payer: Medicare Other | Admitting: Physical Therapy

## 2021-11-27 ENCOUNTER — Other Ambulatory Visit: Payer: Self-pay

## 2021-11-27 ENCOUNTER — Encounter: Payer: Self-pay | Admitting: Physical Therapy

## 2021-11-27 DIAGNOSIS — M79605 Pain in left leg: Secondary | ICD-10-CM

## 2021-11-27 DIAGNOSIS — M6281 Muscle weakness (generalized): Secondary | ICD-10-CM | POA: Diagnosis not present

## 2021-11-27 DIAGNOSIS — R269 Unspecified abnormalities of gait and mobility: Secondary | ICD-10-CM | POA: Diagnosis not present

## 2021-11-27 DIAGNOSIS — R2689 Other abnormalities of gait and mobility: Secondary | ICD-10-CM | POA: Diagnosis not present

## 2021-11-27 DIAGNOSIS — R262 Difficulty in walking, not elsewhere classified: Secondary | ICD-10-CM

## 2021-11-27 DIAGNOSIS — R2681 Unsteadiness on feet: Secondary | ICD-10-CM

## 2021-11-27 NOTE — Therapy (Signed)
[place] MAIN [facility] SERVICES [address] [place], [place], [address] Phone: [phone]   Fax:  [phone]  Physical Therapy Treatment  Patient Details  Name: [name] MRN: [id number] Date of Birth: 30-Sep-1946 Referring Provider (PT): Merlene Pulling PA-C   Encounter Date: 11/27/2021   PT End of Session - 11/27/21 1626     Visit Number 42    Number of Visits 7    Date for PT Re-Evaluation 12/13/21    Authorization Time Period recert 76/2/83-10/19/15    Progress Note Due on Visit 11    PT Start Time 1555    PT Stop Time 1635    PT Time Calculation (min) 40 min    Equipment Utilized During Treatment Gait belt    Activity Tolerance Patient tolerated treatment well;No increased pain    Behavior During Therapy WFL for tasks assessed/performed             Past Medical History:  Diagnosis Date   Allergic rhinitis    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)    Family history of adverse reaction to anesthesia    Daughter was "paralyzed" for several days after 1 surgery.   Heart murmur    mild mitral regurg-echo 2010   Hyperlipidemia    Hypertension    Lupus anticoagulant positive    Obesity    Pre-diabetes    Primary localized osteoarthritis of left hip 03/30/2019   Stroke [name]) 2010   Some right sided weakness.  Gait issues.   Wears dentures    partial upper    Past Surgical History:  Procedure Laterality Date   ABDOMINAL HYSTERECTOMY  1981   APPENDECTOMY     AXILLARY LYMPH NODE BIOPSY Left 05/16/2014   Procedure: EXCISION 2CM LEFT AXILLARY MASS;  Surgeon: [name], MD;  Location: [place];  Service: General;  Laterality: Left;  Left axillary sebaceous cyst excision   CATARACT EXTRACTION W/PHACO Left 04/11/2020   Procedure: CATARACT EXTRACTION PHACO AND INTRAOCULAR LENS PLACEMENT  ([place]) LEFT DIABETIC TORIC LENS  6.05 00:34.9;  Surgeon: [name], MD;  Location: [place];  Service: Ophthalmology;  Laterality: Left;   CATARACT EXTRACTION W/PHACO Right 12/12/2020   Procedure: CATARACT EXTRACTION PHACO AND INTRAOCULAR LENS PLACEMENT ([place]) RIGHT TORIC LENS;  Surgeon: [name], MD;  Location: [place];  Service: Ophthalmology;  Laterality: Right;  6.55 0:41.1   DILATION AND CURETTAGE OF UTERUS     SKIN CANCER EXCISION  2016   [place]   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   Procedure: TOTAL HIP ARTHROPLASTY;  Surgeon: [name], MD;  [facility];  Service: Orthopedics;  Laterality: Left;    There were no vitals filed for this visit.   Subjective Assessment - 11/27/21 1559     Subjective Pt reports no significant changes since last session. Pt reports her tightness and discomfort in her hip/ glutes it still present but pain in her lower leg is improved.    Pertinent History Pt reports she had a stroke in 2010 and was in inpatient rehab for 4 weeks. Pt reports she has been to therapy about one time a year since the onset of her stroke for various ailments including L THA in 2020. Pt reports she has bioness L300 which improved her drop foot on her R ankle. Pt has depended  on her L LE since the stroke due to her weakness and impaired funciton on the right LE. Since the fall she has had throbbing pain in the left LE which is exacerbated with increased weightbearing activity. Pt reports she has had x rays and it shows nothing is broken but despite this she has had continued pain in the left. Pt reports pain is on the lateral aspect of her left LE up into her posterior thigh. Pt reports doctor thinks this could be from nerve related pain but will need PT in order for her insurance to cover MRI of the lumbar spine. Prior to this fall she fell and broke her right wrist.  Pt reports she can perform any activities above the level of the wast but  she is unable to bend over to complete tasks. Pt reports she has multiple wakers, 3 wheel, 4 wheel, wheelchair, standard rolling walker. Pt had brain MRI following fall and no reports of bleeding or issues.    Limitations Walking    How long can you sit comfortably? a couple hours due to low back pain    How long can you stand comfortably? 15 minutes    How long can you walk comfortably? Able to walk around [employer] but was very fatigued following    Currently in Pain? Yes    Pain Location Hip    Pain Orientation Left;Posterior;Proximal    Pain Descriptors / Indicators Aching;Sore    Pain Type Chronic pain    Pain Onset More than a month ago              TREATMENT:   Manual therapy   PT performed passive SLR hamstring stretch to L LE  30 sec hold x4 sets Progressed to SLR hamstring stretch with ankle DF/PF neural flossing 2 sets x10 reps LLE only; PT performed passive single knee to chest stretch 20 sec hold x2 sets Left LE PT performed passive piriformis stretch on LLE with increased tightness noted 30 sec hold x 2 sets    Instructed patient in right sidelying Intensive STM to left piriformis x 12 min utilizing rolling stick; Patient exhibits tightness in piriformis which was alleviated by IASTM  Following manual therapy patient exhibits better tissue extensibility with less tightness and less trigger points;    Pt attempted to assess L hip abduction strength, although pt unable to maintain testing position pt unable to obtain position for proper testing. IN modified position pt unable to lift L LE into abduction against light resistance 3/5 strength. Pt transitioned to supine and performed 2 x 10 clamshells with GTB  Pt also performs bridge with ball squeeze 2 x 10 .    Post manual therapy- patient ambulated 150 feet with SBA without pain and with reports of less stiffness.   Instructed pt in seated variation of clamshells 2 x 10 with GTB   Pt educated throughout session  about proper posture and technique with exercises. Improved exercise technique, movement at target joints, use of target muscles after min to mod verbal, visual, tactile cues.                                   PT Education - 11/27/21 1625     Education Details exercise technique, importance of strength in muscles that are tight or tend to cramp    Person(s) Educated Patient    Methods Explanation    Comprehension Verbalized  understanding              PT Short Term Goals - 11/20/21 0820       PT SHORT TERM GOAL #1   Title Patient will be independent in home exercise program to improve strength/mobility for better functional independence with ADLs.    Baseline Met    Time 3    Period Days    Status Achieved      PT SHORT TERM GOAL #2   Title Pt will be able to complete 6 MWT without requiring rest break or sitting in order to indicate ipmroved aerobic and muscular endurance for everyday tasks.    Baseline 835 feet with 3 wheel walker at PN 9/8    Time 4    Period Weeks    Status Achieved      PT SHORT TERM GOAL #3   Title Patient will complete five times sit to stand test in < 25 seconds indicating an increased LE strength and improved balance.    Baseline 16 sec 9/8 with UE use    Time 4    Period Weeks    Status Achieved    Target Date 06/18/21               PT Long Term Goals - 11/20/21 0821       PT LONG TERM GOAL #1   Title Pt will improve FOTO score to 60 in order to indicate improvement with everyday tasks    Baseline FOTO: 58 om 9/8 (met initial foto goal), 1025: 58% 12/8: 57%, 12/20: 60% 2/7: 56% (previously achieve)    Time 12    Period Weeks    Status Achieved    Target Date 12/13/21      PT LONG TERM GOAL #2   Title Patient will complete five times sit to stand test in < 13 seconds indicating an increased LE strength and improved balance.    Baseline 5XSTS in 16 sec with UE use 06/21/21, 10/25: 18 sec with UE use,  15.1 sec on 12/8, 12/19: 14 sec with 1 UE assist; 2/7: 11.75 sec with 1 UE    Time 8    Period Weeks    Status Achieved    Target Date 11/15/21      PT LONG TERM GOAL #3   Title Patient will increase six minute walk test distance to >900 feet for progression to improve gait ability and safety with ambulation    Baseline 10/25: 650 feet with walker, 12/8: 825 with 3WW, 12/19: 715 feet with 3 wheeled walker; 2/7: 670 ft with 3 wheeled walker    Time 8    Period Weeks    Status On-going    Target Date 01/01/22      PT LONG TERM GOAL #4   Title Pt will safely demonstrate ability to navigate up and down 4 stairs without assistance in order to improve ability to navigate stairs in her daily life    Baseline able to ascend 1 stair but too reports fear of descending more than 1 stair, 12/8: not assessed, 12/20: not assessed; 2/7:  Pt ascends/descends with BUE support on rails and step-to pattern both ways, CGA provided    Time 6    Period Weeks    Status Achieved    Target Date 01/01/22                   Plan - 11/27/21 1627     Clinical Impression Statement  Patient presented with good motivation to complete physical therapy treatment session but still requires extensive manual therapy in order to improve trigger points and tightness in her left gluteal and piriformis musculature.  Patient introduced to several hip strengthening exercises in order to improve tightness and sensation of cramping that occurs in this area.  In future sessions will focus on introducing further exercises for home exercise program the patient can complete with good form to improve left hip muscular strength.  Patient will continue to benefit from skilled physical therapy intervention to improve strength, mobility, functional capacity, and decrease fall risk.    Personal Factors and Comorbidities Age;Comorbidity 1;Transportation    Examination-Activity Limitations Bend;Carry;Squat;Stairs     Examination-Participation Restrictions Cleaning;Laundry    Stability/Clinical Decision Making Evolving/Moderate complexity    Rehab Potential Good    PT Frequency 2x / week    PT Duration 6 weeks    PT Treatment/Interventions ADLs/Self Care Home Management;Aquatic Therapy;Electrical Stimulation;Gait training;Stair training;Functional mobility training;Therapeutic activities;Therapeutic exercise;Balance training;Neuromuscular re-education;Patient/family education;Prosthetic Training;Manual techniques;Passive range of motion;Energy conservation    PT Next Visit Plan attention to objects on her R side via coordination and balance tasks.    PT Home Exercise Plan Instructed in seated clamshell and provided GTB for HEP    Consulted and Agree with Plan of Care Patient             Patient will benefit from skilled therapeutic intervention in order to improve the following deficits and impairments:  Abnormal gait, Decreased balance, Decreased endurance, Decreased mobility, Decreased activity tolerance  Visit Diagnosis: Pain in left leg  Unsteadiness on feet  Difficulty in walking, not elsewhere classified     Problem List Patient Active Problem List   Diagnosis Date Noted   Primary localized osteoarthritis of left hip 03/30/2019   Status post left hip replacement 03/30/2019    Particia Lather, PT 11/27/2021, 4:54 PM  [place] MAIN [facility] SERVICES [address] [place], [place], [address] Phone: [phone]   Fax:  [phone]  Name: BERNESE DOFFING MRN: [id number] Date of Birth: 05/17/1946

## 2021-11-29 ENCOUNTER — Ambulatory Visit: Payer: Medicare Other | Admitting: Physical Therapy

## 2021-11-29 ENCOUNTER — Other Ambulatory Visit: Payer: Self-pay

## 2021-11-29 DIAGNOSIS — R269 Unspecified abnormalities of gait and mobility: Secondary | ICD-10-CM

## 2021-11-29 DIAGNOSIS — M79605 Pain in left leg: Secondary | ICD-10-CM | POA: Diagnosis not present

## 2021-11-29 DIAGNOSIS — R2689 Other abnormalities of gait and mobility: Secondary | ICD-10-CM

## 2021-11-29 DIAGNOSIS — R2681 Unsteadiness on feet: Secondary | ICD-10-CM | POA: Diagnosis not present

## 2021-11-29 DIAGNOSIS — R262 Difficulty in walking, not elsewhere classified: Secondary | ICD-10-CM

## 2021-11-29 DIAGNOSIS — M6281 Muscle weakness (generalized): Secondary | ICD-10-CM | POA: Diagnosis not present

## 2021-11-29 NOTE — Therapy (Signed)
[employer] MAIN [facility] SERVICES [address] [place], [place], [address] Phone: [phone]   Fax:  [phone]  Physical Therapy Treatment  Patient Details  Name: [name] MRN: [id number] Date of Birth: 1946/06/15 Referring Provider (PT): Merlene Pulling PA-C   Encounter Date: 11/29/2021   PT End of Session - 11/29/21 0946     Visit Number 43    Number of Visits 40    Date for PT Re-Evaluation 12/13/21    Authorization Time Period recert 07/14/74-1/0/25    Progress Note Due on Visit 40    PT Start Time 0934    PT Stop Time 1014    PT Time Calculation (min) 40 min    Equipment Utilized During Treatment Gait belt    Activity Tolerance Patient tolerated treatment well;No increased pain    Behavior During Therapy WFL for tasks assessed/performed             Past Medical History:  Diagnosis Date   Allergic rhinitis    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   Heart murmur    mild mitral regurg-echo 2010   Hyperlipidemia    Hypertension    Lupus anticoagulant positive    Obesity    Pre-diabetes    Primary localized osteoarthritis of left hip 03/30/2019   Stroke [facility]) 2010   Some right sided weakness.  Gait issues.   Wears dentures    partial upper    Past Surgical History:  Procedure Laterality Date   ABDOMINAL HYSTERECTOMY  1981   APPENDECTOMY     AXILLARY LYMPH NODE BIOPSY Left 05/16/2014   Procedure: EXCISION 2CM LEFT AXILLARY MASS;  Surgeon: [name], MD;  Location: [place];  Service: General;  Laterality: Left;  Left axillary sebaceous cyst excision   CATARACT EXTRACTION W/PHACO Left 04/11/2020   Procedure: CATARACT EXTRACTION PHACO AND INTRAOCULAR LENS PLACEMENT  ([place]) LEFT DIABETIC TORIC LENS  6.05 00:34.9;  Surgeon: [name], MD;  Location: [place];  Service: Ophthalmology;  Laterality: Left;   CATARACT EXTRACTION W/PHACO Right 12/12/2020   Procedure: CATARACT EXTRACTION PHACO AND INTRAOCULAR LENS PLACEMENT ([place]) RIGHT TORIC LENS;  Surgeon: [name], MD;  Location: [place];  Service: Ophthalmology;  Laterality: Right;  6.55 0:41.1   DILATION AND CURETTAGE OF UTERUS     SKIN CANCER EXCISION  2016   [facility]   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   Procedure: TOTAL HIP ARTHROPLASTY;  Surgeon: [name], MD;  [facility]: [facility];  Service: Orthopedics;  Laterality: Left;    There were no vitals filed for this visit.   Subjective Assessment - 11/29/21 0946     Subjective Pt reports no significant changes since last session. Pr reports continuing regresion of pain from lower leg to her gluteal region.    Pertinent History Pt reports she had a stroke in 2010 and was in inpatient rehab for 4 weeks. Pt reports she has been to therapy about one time a year since the onset of her stroke for various ailments including L THA in 2020. Pt reports she has bioness L300 which improved her drop foot on her R ankle. Pt has depended on her L LE since the stroke due  to her weakness and impaired funciton on the right LE. Since the fall she has had throbbing pain in the left LE which is exacerbated with increased weightbearing activity. Pt reports she has had x rays and it shows nothing is broken but despite this she has had continued pain in the left. Pt reports pain is on the lateral aspect of her left LE up into her posterior thigh. Pt reports doctor thinks this could be from nerve related pain but will need PT in order for her insurance to cover MRI of the lumbar spine. Prior to this fall she fell and broke her right wrist.  Pt reports she can perform any activities above the level of the wast but she is unable to bend over to complete  tasks. Pt reports she has multiple wakers, 3 wheel, 4 wheel, wheelchair, standard rolling walker. Pt had brain MRI following fall and no reports of bleeding or issues.    Limitations Walking    How long can you sit comfortably? a couple hours due to low back pain    How long can you stand comfortably? 15 minutes    How long can you walk comfortably? Able to walk around [place] but was very fatigued following    Pain Onset More than a month ago             TREATMENT:   Manual therapy   PT performed passive SLR hamstring stretch to L LE  30 sec hold x4 sets Progressed to SLR hamstring stretch with ankle DF/PF neural flossing 2 sets x10 reps LLE only; PT performed passive single knee to chest stretch 20 sec hold x2 sets Left LE PT performed passive piriformis stretch on LLE with increased tightness noted 30 sec hold x 2 sets    Instructed patient in right sidelying Intensive STM to left piriformis x 12 min utilizing rolling stick; Patient exhibits tightness in piriformis which was alleviated by IASTM  Following manual therapy patient exhibits better tissue extensibility with less tightness and less trigger points;    Pt also performs bridge with ball squeeze 2 x 10 .   Post manual therapy- patient ambulated 150 feet with SBA without pain and with reports of less stiffness.   Instructed pt in seated variation of clamshells 2 x 10 with GTB   Pt educated throughout session about proper posture and technique with exercises. Improved exercise technique, movement at target joints, use of target muscles after min to mod verbal, visual, tactile cues.                                      PT Short Term Goals - 11/20/21 0820       PT SHORT TERM GOAL #1   Title Patient will be independent in home exercise program to improve strength/mobility for better functional independence with ADLs.    Baseline Met    Time 3    Period Days    Status Achieved       PT SHORT TERM GOAL #2   Title Pt will be able to complete 6 MWT without requiring rest break or sitting in order to indicate ipmroved aerobic and muscular endurance for everyday tasks.    Baseline 835 feet with 3 wheel walker at PN 9/8    Time 4    Period Weeks    Status Achieved      PT SHORT TERM GOAL #  3   Title Patient will complete five times sit to stand test in < 25 seconds indicating an increased LE strength and improved balance.    Baseline 16 sec 9/8 with UE use    Time 4    Period Weeks    Status Achieved    Target Date 06/18/21               PT Long Term Goals - 11/20/21 0821       PT LONG TERM GOAL #1   Title Pt will improve FOTO score to 60 in order to indicate improvement with everyday tasks    Baseline FOTO: 58 om 9/8 (met initial foto goal), 1025: 58% 12/8: 57%, 12/20: 60% 2/7: 56% (previously achieve)    Time 12    Period Weeks    Status Achieved    Target Date 12/13/21      PT LONG TERM GOAL #2   Title Patient will complete five times sit to stand test in < 13 seconds indicating an increased LE strength and improved balance.    Baseline 5XSTS in 16 sec with UE use 06/21/21, 10/25: 18 sec with UE use, 15.1 sec on 12/8, 12/19: 14 sec with 1 UE assist; 2/7: 11.75 sec with 1 UE    Time 8    Period Weeks    Status Achieved    Target Date 11/15/21      PT LONG TERM GOAL #3   Title Patient will increase six minute walk test distance to >900 feet for progression to improve gait ability and safety with ambulation    Baseline 10/25: 650 feet with walker, 12/8: 825 with 3WW, 12/19: 715 feet with 3 wheeled walker; 2/7: 670 ft with 3 wheeled walker    Time 8    Period Weeks    Status On-going    Target Date 01/01/22      PT LONG TERM GOAL #4   Title Pt will safely demonstrate ability to navigate up and down 4 stairs without assistance in order to improve ability to navigate stairs in her daily life    Baseline able to ascend 1 stair but too reports fear of  descending more than 1 stair, 12/8: not assessed, 12/20: not assessed; 2/7:  Pt ascends/descends with BUE support on rails and step-to pattern both ways, CGA provided    Time 6    Period Weeks    Status Achieved    Target Date 01/01/22                   Plan - 11/29/21 1006     Clinical Impression Statement Patient presented with good motivation to complete physical therapy treatment session but still requires extensive manual therapy in order to improve trigger points and tightness in her left gluteal and piriformis musculature. Patient continued with  several hip strengthening exercises in order to improve tightness and sensation of cramping that occurs in this area. pt unable to perform exercises at home since previous session due to being busy but was encouraged to compelte them a few times between now and her next visit.  In future sessions will continue focus on introducing further hip strengthening and stability related exercises for home exercise program the patient can complete with good form to improve left hip muscular strength. Patient will continue to benefit from skilled physical therapy intervention to improve strength, mobility, functional capacity, and decrease fall risk.    Personal Factors and Comorbidities Age;Comorbidity 1;Transportation  Examination-Activity Limitations Bend;Carry;Squat;Stairs    Examination-Participation Restrictions Cleaning;Laundry    Stability/Clinical Decision Making Evolving/Moderate complexity    Rehab Potential Good    PT Frequency 2x / week    PT Duration 6 weeks    PT Treatment/Interventions ADLs/Self Care Home Management;Aquatic Therapy;Electrical Stimulation;Gait training;Stair training;Functional mobility training;Therapeutic activities;Therapeutic exercise;Balance training;Neuromuscular re-education;Patient/family education;Prosthetic Training;Manual techniques;Passive range of motion;Energy conservation    PT Next Visit Plan  attention to objects on her R side via coordination and balance tasks.    PT Home Exercise Plan Instructed in seated clamshell and provided GTB for HEP    Consulted and Agree with Plan of Care Patient             Patient will benefit from skilled therapeutic intervention in order to improve the following deficits and impairments:  Abnormal gait, Decreased balance, Decreased endurance, Decreased mobility, Decreased activity tolerance  Visit Diagnosis: Unsteadiness on feet  Difficulty in walking, not elsewhere classified  Abnormality of gait and mobility  Other abnormalities of gait and mobility     Problem List Patient Active Problem List   Diagnosis Date Noted   Primary localized osteoarthritis of left hip 03/30/2019   Status post left hip replacement 03/30/2019    Particia Lather, PT 11/29/2021, 10:50 AM  [place] [address] [place], [place], [address] Phone: [phone]   Fax:  [phone]  Name: [name] MRN: [id number] Date of Birth: [birth year]

## 2021-12-03 DIAGNOSIS — Z7901 Long term (current) use of anticoagulants: Secondary | ICD-10-CM | POA: Diagnosis not present

## 2021-12-04 ENCOUNTER — Ambulatory Visit: Payer: Medicare Other | Admitting: Physical Therapy

## 2021-12-06 ENCOUNTER — Ambulatory Visit: Payer: Medicare Other | Admitting: Physical Therapy

## 2021-12-11 ENCOUNTER — Ambulatory Visit: Payer: Medicare Other

## 2021-12-11 ENCOUNTER — Other Ambulatory Visit: Payer: Self-pay

## 2021-12-11 DIAGNOSIS — M6281 Muscle weakness (generalized): Secondary | ICD-10-CM | POA: Diagnosis not present

## 2021-12-11 DIAGNOSIS — M79605 Pain in left leg: Secondary | ICD-10-CM | POA: Diagnosis not present

## 2021-12-11 DIAGNOSIS — R2689 Other abnormalities of gait and mobility: Secondary | ICD-10-CM | POA: Diagnosis not present

## 2021-12-11 DIAGNOSIS — R269 Unspecified abnormalities of gait and mobility: Secondary | ICD-10-CM

## 2021-12-11 DIAGNOSIS — R262 Difficulty in walking, not elsewhere classified: Secondary | ICD-10-CM | POA: Diagnosis not present

## 2021-12-11 DIAGNOSIS — R2681 Unsteadiness on feet: Secondary | ICD-10-CM

## 2021-12-11 NOTE — Therapy (Signed)
[place] MAIN [facility] SERVICES [address] [place], [place], [address] Phone: [phone]   Fax:  [phone]  Physical Therapy Treatment  Patient Details  Name: [name] MRN: [id number] Date of Birth: April 03, 1946 Referring Provider (PT): Merlene Pulling PA-C   Encounter Date: 12/11/2021   PT End of Session - 12/11/21 1304     Number of Visits 85    Date for PT Re-Evaluation 12/13/21    Authorization Time Period recert 14/3/88-05/20/56    Progress Note Due on Visit 40    PT Start Time 0930    PT Stop Time 1014    PT Time Calculation (min) 44 min    Equipment Utilized During Treatment Gait belt    Activity Tolerance Patient tolerated treatment well;No increased pain    Behavior During Therapy WFL for tasks assessed/performed             Past Medical History:  Diagnosis Date   Allergic rhinitis    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   Heart murmur    mild mitral regurg-echo 2010   Hyperlipidemia    Hypertension    Lupus anticoagulant positive    Obesity    Pre-diabetes    Primary localized osteoarthritis of left hip 03/30/2019   Stroke [facility]) 2010   Some right sided weakness.  Gait issues.   Wears dentures    partial upper    Past Surgical History:  Procedure Laterality Date   ABDOMINAL HYSTERECTOMY  1981   APPENDECTOMY     AXILLARY LYMPH NODE BIOPSY Left 05/16/2014   Procedure: EXCISION 2CM LEFT AXILLARY MASS;  Surgeon: [name], MD;  Location: [place];  Service: General;  Laterality: Left;  Left axillary sebaceous cyst excision   CATARACT EXTRACTION W/PHACO Left 04/11/2020   Procedure: CATARACT EXTRACTION PHACO AND INTRAOCULAR LENS PLACEMENT ([place]) LEFT DIABETIC  TORIC LENS  6.05 00:34.9;  Surgeon: [name], MD;  Location: [place];  Service: Ophthalmology;  Laterality: Left;   CATARACT EXTRACTION W/PHACO Right 12/12/2020   Procedure: CATARACT EXTRACTION PHACO AND INTRAOCULAR LENS PLACEMENT ([place]) RIGHT TORIC LENS;  Surgeon: [name], MD;  Location: [place];  Service: Ophthalmology;  Laterality: Right;  6.55 0:41.1   DILATION AND CURETTAGE OF UTERUS     SKIN CANCER EXCISION  2016   [facility]   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   Procedure: TOTAL HIP ARTHROPLASTY;  Surgeon: [name], MD;  [facility];  Service: Orthopedics;  Laterality: Left;    There were no vitals filed for this visit.  INTERVENTIONS:  Manual Therapy:  PT performed passive SLR hamstring stretch to L LE  30 sec hold x4 sets PT performed passive single knee to chest stretch 30 sec hold x 4 sets Left LE PT performed passive piriformis stretch on LLE with increased tightness noted 30 sec hold x 2 sets  STM to left piriformis x 8 min utilizing rolling stick; Patient exhibits tightness in piriformis with reported improvement following manual therapy.   Therapeutic Exercises:   Reviewed clamshell and instructed patient to start off without resistance until able to perform well with full ROM. She performed 2 sets of 10 reps each LE Supine bridging with  hip add squeeze x 10 reps  Added supine Hip abd x 10 reps each LE  Added sidelye Hip abd x 10 reps each LE   Education provided throughout session via VC/TC and demonstration to facilitate movement at target joints and correct muscle activation for all testing and exercises performed.   Access Code: [email or web address] URL: [email or web address] Date: 12/11/2021 Prepared by: Sande Brothers  Exercises Clamshell - 1 x daily - 2-3 x weekly - 3 sets - 10 reps - 2 hold Supine Bridge with Mini Swiss Ball Between Knees - 1 x daily - 2-3 x weekly - 3 sets - 10 reps - 2  hold Sidelying Hip Abduction - 1 x daily - 2-3 x weekly - 3 sets - 10 reps - 2 hold                            PT Short Term Goals - 11/20/21 0820       PT SHORT TERM GOAL #1   Title Patient will be independent in home exercise program to improve strength/mobility for better functional independence with ADLs.    Baseline Met    Time 3    Period Days    Status Achieved      PT SHORT TERM GOAL #2   Title Pt will be able to complete 6 MWT without requiring rest break or sitting in order to indicate ipmroved aerobic and muscular endurance for everyday tasks.    Baseline 835 feet with 3 wheel walker at PN 9/8    Time 4    Period Weeks    Status Achieved      PT SHORT TERM GOAL #3   Title Patient will complete five times sit to stand test in < 25 seconds indicating an increased LE strength and improved balance.    Baseline 16 sec 9/8 with UE use    Time 4    Period Weeks    Status Achieved    Target Date 06/18/21               PT Long Term Goals - 11/20/21 0821       PT LONG TERM GOAL #1   Title Pt will improve FOTO score to 60 in order to indicate improvement with everyday tasks    Baseline FOTO: 58 om 9/8 (met initial foto goal), 1025: 58% 12/8: 57%, 12/20: 60% 2/7: 56% (previously achieve)    Time 12    Period Weeks    Status Achieved    Target Date 12/13/21      PT LONG TERM GOAL #2   Title Patient will complete five times sit to stand test in < 13 seconds indicating an increased LE strength and improved balance.    Baseline 5XSTS in 16 sec with UE use 06/21/21, 10/25: 18 sec with UE use, 15.1 sec on 12/8, 12/19: 14 sec with 1 UE assist; 2/7: 11.75 sec with 1 UE    Time 8    Period Weeks    Status Achieved    Target Date 11/15/21      PT LONG TERM GOAL #3   Title Patient will increase six minute walk test distance to >900 feet for progression to improve gait ability and safety with ambulation    Baseline 10/25: 650 feet with walker, 12/8:  825 with 3WW, 12/19: 715 feet with 3 wheeled walker; 2/7: 670 ft with 3 wheeled walker    Time 8  Period Weeks    Status On-going    Target Date 01/01/22      PT LONG TERM GOAL #4   Title Pt will safely demonstrate ability to navigate up and down 4 stairs without assistance in order to improve ability to navigate stairs in her daily life    Baseline able to ascend 1 stair but too reports fear of descending more than 1 stair, 12/8: not assessed, 12/20: not assessed; 2/7:  Pt ascends/descends with BUE support on rails and step-to pattern both ways, CGA provided    Time 6    Period Weeks    Status Achieved    Target Date 01/01/22                   Plan - 12/11/21 1302     Clinical Impression Statement Patient presents with good motivation and able to progress some today with hip strengthening. She requires cues to perform HEP correctly. Emphasized importance of performing HEP for optimal outcomes. Patient reported feeling okay after session and no worsening with left hip pain. Patient will continue to benefit from skilled physical therapy intervention to improve strength, mobility, functional capacity, and decrease fall risk    Personal Factors and Comorbidities Age;Comorbidity 1;Transportation    Examination-Activity Limitations Bend;Carry;Squat;Stairs    Examination-Participation Restrictions Cleaning;Laundry    Stability/Clinical Decision Making Evolving/Moderate complexity    Rehab Potential Good    PT Frequency 2x / week    PT Duration 6 weeks    PT Treatment/Interventions ADLs/Self Care Home Management;Aquatic Therapy;Electrical Stimulation;Gait training;Stair training;Functional mobility training;Therapeutic activities;Therapeutic exercise;Balance training;Neuromuscular re-education;Patient/family education;Prosthetic Training;Manual techniques;Passive range of motion;Energy conservation    PT Next Visit Plan attention to objects on her R side via coordination and balance  tasks.    PT Home Exercise Plan Instructed in seated clamshell and provided GTB for HEP    Consulted and Agree with Plan of Care Patient             Patient will benefit from skilled therapeutic intervention in order to improve the following deficits and impairments:  Abnormal gait, Decreased balance, Decreased endurance, Decreased mobility, Decreased activity tolerance  Visit Diagnosis: Pain in left leg  Abnormality of gait and mobility  Difficulty in walking, not elsewhere classified  Muscle weakness (generalized)  Unsteadiness on feet     Problem List Patient Active Problem List   Diagnosis Date Noted   Primary localized osteoarthritis of left hip 03/30/2019   Status post left hip replacement 03/30/2019    Lewis Moccasin, PT 12/11/2021, 1:05 PM  [place] MAIN [facility] SERVICES [address] [place], [place], [address] Phone: [phone]   Fax:  [phone]  Name: [name] MRN: [id number] Date of Birth: 20-Oct-1945

## 2021-12-13 ENCOUNTER — Ambulatory Visit: Payer: Medicare Other | Admitting: Physical Therapy

## 2021-12-18 ENCOUNTER — Other Ambulatory Visit: Payer: Self-pay

## 2021-12-18 ENCOUNTER — Ambulatory Visit: Payer: Medicare Other | Attending: Surgical | Admitting: Physical Therapy

## 2021-12-18 ENCOUNTER — Encounter: Payer: Self-pay | Admitting: Physical Therapy

## 2021-12-18 DIAGNOSIS — R262 Difficulty in walking, not elsewhere classified: Secondary | ICD-10-CM | POA: Diagnosis not present

## 2021-12-18 DIAGNOSIS — R2681 Unsteadiness on feet: Secondary | ICD-10-CM | POA: Diagnosis not present

## 2021-12-18 DIAGNOSIS — M6281 Muscle weakness (generalized): Secondary | ICD-10-CM | POA: Diagnosis not present

## 2021-12-18 DIAGNOSIS — M79605 Pain in left leg: Secondary | ICD-10-CM | POA: Insufficient documentation

## 2021-12-18 DIAGNOSIS — R278 Other lack of coordination: Secondary | ICD-10-CM | POA: Insufficient documentation

## 2021-12-18 DIAGNOSIS — M79662 Pain in left lower leg: Secondary | ICD-10-CM | POA: Insufficient documentation

## 2021-12-18 DIAGNOSIS — R2689 Other abnormalities of gait and mobility: Secondary | ICD-10-CM | POA: Diagnosis not present

## 2021-12-18 DIAGNOSIS — R269 Unspecified abnormalities of gait and mobility: Secondary | ICD-10-CM | POA: Diagnosis not present

## 2021-12-18 NOTE — Therapy (Signed)
[place] MAIN [facility] SERVICES [address] [place], [place], [address] Phone: [phone]   Fax:  [phone]  Physical Therapy Treatment/ re-certification Note   Patient Details  Name: [name] MRN: [id number] Date of Birth: Oct 04, 1946 Referring Provider (PT): Merlene Pulling PA-C   Encounter Date: 12/18/2021   PT End of Session - 12/18/21 1314     Visit Number 45    Number of Visits 34    Date for PT Re-Evaluation 12/13/21    Authorization Time Period recert 49/6/75-06/14/62    Progress Note Due on Visit 40    PT Start Time 0934    PT Stop Time 1012    PT Time Calculation (min) 38 min    Equipment Utilized During Treatment Gait belt    Activity Tolerance Patient tolerated treatment well;No increased pain    Behavior During Therapy WFL for tasks assessed/performed             Past Medical History:  Diagnosis Date   Allergic rhinitis    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   Heart murmur    mild mitral regurg-echo 2010   Hyperlipidemia    Hypertension    Lupus anticoagulant positive    Obesity    Pre-diabetes    Primary localized osteoarthritis of left hip 03/30/2019   Stroke [facility]) 2010   Some right sided weakness.  Gait issues.   Wears dentures    partial upper    Past Surgical History:  Procedure Laterality Date   ABDOMINAL HYSTERECTOMY  1981   APPENDECTOMY     AXILLARY LYMPH NODE BIOPSY Left 05/16/2014   Procedure: EXCISION 2CM LEFT AXILLARY MASS;  Surgeon: [name], MD;  Location: [place];  Service: General;  Laterality: Left;  Left axillary sebaceous cyst excision   CATARACT EXTRACTION W/PHACO Left 04/11/2020   Procedure: CATARACT EXTRACTION PHACO AND  INTRAOCULAR LENS PLACEMENT ([place]) LEFT DIABETIC TORIC LENS  6.05 00:34.9;  Surgeon: [name], MD;  Location: [place];  Service: Ophthalmology;  Laterality: Left;   CATARACT EXTRACTION W/PHACO Right 12/12/2020   Procedure: CATARACT EXTRACTION PHACO AND INTRAOCULAR LENS PLACEMENT ([place]) RIGHT TORIC LENS;  Surgeon: [name], MD;  Location: [place];  Service: Ophthalmology;  Laterality: Right;  6.55 0:41.1   DILATION AND CURETTAGE OF UTERUS     SKIN CANCER EXCISION  2016   [facility]   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   Procedure: TOTAL HIP ARTHROPLASTY;  Surgeon: [name], MD;  [facility]: [facility];  Service: Orthopedics;  Laterality: Left;    There were no vitals filed for this visit.   Subjective Assessment - 12/18/21 1312     Subjective Pt reports no significant changes since last session.  Patient reports no significant pain this date did have some increase in pain over the weekend up to a 5 out of 10 pain.    Pertinent History Pt reports she had a stroke in 2010 and was in inpatient rehab for 4 weeks. Pt reports she has been to therapy about one time a year since the onset of her stroke for various ailments including L THA in 2020. Pt reports she has bioness L300 which improved her drop foot  on her R ankle. Pt has depended on her L LE since the stroke due to her weakness and impaired funciton on the right LE. Since the fall she has had throbbing pain in the left LE which is exacerbated with increased weightbearing activity. Pt reports she has had x rays and it shows nothing is broken but despite this she has had continued pain in the left. Pt reports pain is on the lateral aspect of her left LE up into her posterior thigh. Pt reports doctor thinks this could be from nerve related pain but will need PT in order for her insurance to cover MRI of the lumbar spine. Prior to this fall she fell and broke her right wrist.  Pt reports she can perform any activities  above the level of the wast but she is unable to bend over to complete tasks. Pt reports she has multiple wakers, 3 wheel, 4 wheel, wheelchair, standard rolling walker. Pt had brain MRI following fall and no reports of bleeding or issues.    Limitations Walking    How long can you sit comfortably? a couple hours due to low back pain    How long can you stand comfortably? 15 minutes    How long can you walk comfortably? Able to walk around [place] but was very fatigued following    Currently in Pain? Yes    Pain Score 3     Pain Location Hip    Pain Orientation Left;Posterior;Proximal    Pain Descriptors / Indicators Aching;Sore    Pain Type Chronic pain    Pain Onset More than a month ago    Pain Frequency Intermittent    Aggravating Factors  Increase in activity    Pain Relieving Factors Manual therapy technique    Multiple Pain Sites No            Physical therapy treatment session today consisted of completing assessment of goals and administration of testing as demonstrated in flow sheet and in goals section of this exam. . Addition treatments may be found below.   Manual techniques performed the session Left lower extremity hamstring stretch 2 x 45 seconds Left lower extremity knee-to-chest stretch 2 x 45 seconds Left lower extremity piriformis stretch 2 x 45 seconds Instrument assisted soft tissue mobilization to the left piriformis and hip external rotation musculature.  For approximately 7-8 minutes Patient able to roll into right side-lying with only assistance for positioning of pillows on mat table.  6-minute walk test patient ambulated 800 feet with 3 wheeled walker, standby assist from therapist.  Patient's pace did decrease throughout ambulatory bout.                              PT Education - 12/18/21 1314     Education Details Therapeutic progress    Person(s) Educated Patient    Methods Explanation    Comprehension Verbalized  understanding              PT Short Term Goals - 11/20/21 0820       PT SHORT TERM GOAL #1   Title Patient will be independent in home exercise program to improve strength/mobility for better functional independence with ADLs.    Baseline Met    Time 3    Period Days    Status Achieved      PT SHORT TERM GOAL #2   Title Pt will be able to complete 6 MWT  without requiring rest break or sitting in order to indicate ipmroved aerobic and muscular endurance for everyday tasks.    Baseline 835 feet with 3 wheel walker at PN 9/8    Time 4    Period Weeks    Status Achieved      PT SHORT TERM GOAL #3   Title Patient will complete five times sit to stand test in < 25 seconds indicating an increased LE strength and improved balance.    Baseline 16 sec 9/8 with UE use    Time 4    Period Weeks    Status Achieved    Target Date 06/18/21               PT Long Term Goals - 12/18/21 0953       PT LONG TERM GOAL #1   Title Pt will improve FOTO score to 60 in order to indicate improvement with everyday tasks    Baseline FOTO: 58 om 9/8 (met initial foto goal), 1025: 58% 12/8: 57%, 12/20: 60% 2/7: 56% (previously achieve) 55 on 12/18/21    Time 12    Period Weeks    Status Partially Met    Target Date 01/29/22      PT LONG TERM GOAL #2   Title Patient will complete five times sit to stand test in < 13 seconds indicating an increased LE strength and improved balance.    Baseline 5XSTS in 16 sec with UE use 06/21/21, 10/25: 18 sec with UE use, 15.1 sec on 12/8, 12/19: 14 sec with 1 UE assist; 2/7: 11.75 sec with 1 UE    Time 8    Period Weeks    Status Achieved    Target Date 01/29/22      PT LONG TERM GOAL #3   Title Patient will increase six minute walk test distance to >900 feet for progression to improve gait ability and safety with ambulation    Baseline 10/25: 650 feet with walker, 12/8: 825 with 3WW, 12/19: 715 feet with 3 wheeled walker; 2/7: 670 ft with 3 wheeled walker  3/7:    Time 8    Period Weeks    Status On-going    Target Date 01/29/22      PT LONG TERM GOAL #4   Title Pt will safely demonstrate ability to navigate up and down 4 stairs without assistance in order to improve ability to navigate stairs in her daily life    Baseline able to ascend 1 stair but too reports fear of descending more than 1 stair, 12/8: not assessed, 12/20: not assessed; 2/7:  Pt ascends/descends with BUE support on rails and step-to pattern both ways, CGA provided    Time 6    Period Weeks    Status Achieved    Target Date 01/29/22                   Plan - 12/18/21 1314     Clinical Impression Statement Patient presents to physical therapy for recertification note and update of physical therapy goals this session.  Of course of therapy patient has made significant progress with left lower extremity pain as well as lower extremity strength and mobility.  Patient does still have mobility restrictions as evidenced by 6-minute walk test that are limiting her ability to ambulate distances in the community.  We will continue to work on this in future sessions to improve patient mobility and overall function.  In therapy we also plan to  continue to monitor and treat her left lower extremity hip and occasional radiating leg pain and continue to work on hip strengthening exercises in order to improve her muscular strength and endurance. Patient's condition has the potential to improve in response to therapy. Maximum improvement is yet to be obtained. The anticipated improvement is attainable and reasonable in a generally predictable time.      Personal Factors and Comorbidities Age;Comorbidity 1;Transportation    Examination-Activity Limitations Bend;Carry;Squat;Stairs    Examination-Participation Restrictions Cleaning;Laundry    Stability/Clinical Decision Making Evolving/Moderate complexity    Rehab Potential Good    PT Frequency 2x / week    PT Duration 6 weeks    PT  Treatment/Interventions ADLs/Self Care Home Management;Aquatic Therapy;Electrical Stimulation;Gait training;Stair training;Functional mobility training;Therapeutic activities;Therapeutic exercise;Balance training;Neuromuscular re-education;Patient/family education;Prosthetic Training;Manual techniques;Passive range of motion;Energy conservation    PT Next Visit Plan attention to objects on her R side via coordination and balance tasks.    PT Home Exercise Plan Instructed in seated clamshell and provided GTB for HEP    Consulted and Agree with Plan of Care Patient             Patient will benefit from skilled therapeutic intervention in order to improve the following deficits and impairments:  Abnormal gait, Decreased balance, Decreased endurance, Decreased mobility, Decreased activity tolerance  Visit Diagnosis: Pain in left leg  Abnormality of gait and mobility  Difficulty in walking, not elsewhere classified     Problem List Patient Active Problem List   Diagnosis Date Noted   Primary localized osteoarthritis of left hip 03/30/2019   Status post left hip replacement 03/30/2019    Particia Lather, PT 12/18/2021, 1:23 PM  [place] [address] [place], [place], [address] Phone: [phone]   Fax:  [phone]  Name: WILLAMINA GRIESHOP MRN: [id number] Date of Birth: 11/15/1945

## 2021-12-20 ENCOUNTER — Other Ambulatory Visit: Payer: Self-pay

## 2021-12-20 ENCOUNTER — Ambulatory Visit: Payer: Medicare Other | Admitting: Physical Therapy

## 2021-12-20 ENCOUNTER — Encounter: Payer: Self-pay | Admitting: Physical Therapy

## 2021-12-20 DIAGNOSIS — R262 Difficulty in walking, not elsewhere classified: Secondary | ICD-10-CM | POA: Diagnosis not present

## 2021-12-20 DIAGNOSIS — R2689 Other abnormalities of gait and mobility: Secondary | ICD-10-CM | POA: Diagnosis not present

## 2021-12-20 DIAGNOSIS — R278 Other lack of coordination: Secondary | ICD-10-CM | POA: Diagnosis not present

## 2021-12-20 DIAGNOSIS — M79605 Pain in left leg: Secondary | ICD-10-CM | POA: Diagnosis not present

## 2021-12-20 DIAGNOSIS — R269 Unspecified abnormalities of gait and mobility: Secondary | ICD-10-CM | POA: Diagnosis not present

## 2021-12-20 DIAGNOSIS — M6281 Muscle weakness (generalized): Secondary | ICD-10-CM | POA: Diagnosis not present

## 2021-12-20 NOTE — Therapy (Signed)
[place] MAIN [facility] SERVICES [address] [place], [place], [address] Phone: [phone]   Fax:  [phone]  Physical Therapy Treatment  Patient Details  Name: [name] MRN: [id number] Date of Birth: [birth year] Referring Provider (PT): Merlene Pulling PA-C   Encounter Date: 12/20/2021   PT End of Session - 12/20/21 0938     Visit Number 46    Number of Visits 41    Date for PT Re-Evaluation 01/29/22    Authorization Time Period recert 35/7/01-04/19/92 Recert 9/0/30 - 0/92/33    Progress Note Due on Visit 50    PT Start Time 0930    PT Stop Time 1014    PT Time Calculation (min) 44 min    Equipment Utilized During Treatment Gait belt    Activity Tolerance Patient tolerated treatment well;No increased pain    Behavior During Therapy WFL for tasks assessed/performed             Past Medical History:  Diagnosis Date   Allergic rhinitis    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   Heart murmur    mild mitral regurg-echo 2010   Hyperlipidemia    Hypertension    Lupus anticoagulant positive    Obesity    Pre-diabetes    Primary localized osteoarthritis of left hip 03/30/2019   Stroke [facility]) 2010   Some right sided weakness.  Gait issues.   Wears dentures    partial upper    Past Surgical History:  Procedure Laterality Date   ABDOMINAL HYSTERECTOMY  1981   APPENDECTOMY     AXILLARY LYMPH NODE BIOPSY Left 05/16/2014   Procedure: EXCISION 2CM LEFT AXILLARY MASS;  Surgeon: [name], MD;  Location: [place];  Service: General;  Laterality: Left;  Left axillary sebaceous cyst excision   CATARACT EXTRACTION W/PHACO Left 04/11/2020   Procedure: CATARACT EXTRACTION PHACO AND  INTRAOCULAR LENS PLACEMENT ([place]) LEFT DIABETIC TORIC LENS  6.05 00:34.9;  Surgeon: [name], MD;  Location: [place];  Service: Ophthalmology;  Laterality: Left;   CATARACT EXTRACTION W/PHACO Right 12/12/2020   Procedure: CATARACT EXTRACTION PHACO AND INTRAOCULAR LENS PLACEMENT ([place]) RIGHT TORIC LENS;  Surgeon: [name], MD;  Location: [place];  Service: Ophthalmology;  Laterality: Right;  6.55 0:41.1   DILATION AND CURETTAGE OF UTERUS     SKIN CANCER EXCISION  2016   [facility]   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   Procedure: TOTAL HIP ARTHROPLASTY;  Surgeon: [name], MD;  [facility];  Service: Orthopedics;  Laterality: Left;    There were no vitals filed for this visit.   Subjective Assessment - 12/20/21 0936     Subjective Pt rpeorts no falls stumbles or LOB since last sesion. Does report some "cramping" in her right hip region ( lateral hip and right sid eof lower back) this session. Report sit feeling better after getting out of car and ambulating a little bit. She does report she did not wear BIONESS system yesterday which may be causing some hip soreness today.    Pertinent History Pt reports she had a stroke in 2010 and was in inpatient rehab for 4 weeks. Pt reports she has been to therapy  about one time a year since the onset of her stroke for various ailments including L THA in 2020. Pt reports she has bioness L300 which improved her drop foot on her R ankle. Pt has depended on her L LE since the stroke due to her weakness and impaired funciton on the right LE. Since the fall she has had throbbing pain in the left LE which is exacerbated with increased weightbearing activity. Pt reports she has had x rays and it shows nothing is broken but despite this she has had continued pain in the left. Pt reports pain is on the lateral aspect of her left LE up into her posterior thigh. Pt reports doctor thinks this could be from nerve related pain but  will need PT in order for her insurance to cover MRI of the lumbar spine. Prior to this fall she fell and broke her right wrist.  Pt reports she can perform any activities above the level of the wast but she is unable to bend over to complete tasks. Pt reports she has multiple wakers, 3 wheel, 4 wheel, wheelchair, standard rolling walker. Pt had brain MRI following fall and no reports of bleeding or issues.    Limitations Walking    How long can you sit comfortably? a couple hours due to low back pain    How long can you stand comfortably? 15 minutes    How long can you walk comfortably? Able to walk around [place] but was very fatigued following    Currently in Pain? Yes    Pain Score 4     Pain Location Hip    Pain Orientation Right;Left;Proximal    Pain Descriptors / Indicators Aching;Sore    Pain Onset More than a month ago              TREATMENT:   Manual therapy   PT performed passive SLR hamstring stretch to L LE  30 sec hold x4 sets Progressed to SLR hamstring stretch with ankle DF/PF neural flossing 2 sets x10 reps LLE only; PT performed passive single knee to chest stretch 20 sec hold x2 sets Left LE and right lower extremity PT performed passive piriformis stretch on right lower extremity and LLE with increased tightness noted 30 sec hold x 2 sets    Instructed patient in right sidelying Intensive STM to left piriformis x 12 min utilizing rolling stick; Patient exhibits tightness in piriformis as well as gluteus medius and tensor fascia latae which was alleviated by IASTM  Following manual therapy patient exhibits better tissue extensibility with less tightness and less trigger points Decrease trigger points in left piriformis this date compared to previous sessions  Therex:   Sldelying Left Hip abduction 2 x 10, hip in slight flexion throughout but otherwise good form for proper muscle activation   bridge with ball squeeze 2 x 10 .  2 x 10 GTB supine clamshells  cues for hold times.   Pt educated throughout session about proper posture and technique with exercises. Improved exercise technique, movement at target joints, use of target muscles after min to mod verbal, visual, tactile cues.                              PT Education - 12/20/21 1245     Education Details Therapeutic progress    Person(s) Educated Patient    Methods Explanation    Comprehension Verbalized understanding  PT Short Term Goals - 11/20/21 0820       PT SHORT TERM GOAL #1   Title Patient will be independent in home exercise program to improve strength/mobility for better functional independence with ADLs.    Baseline Met    Time 3    Period Days    Status Achieved      PT SHORT TERM GOAL #2   Title Pt will be able to complete 6 MWT without requiring rest break or sitting in order to indicate ipmroved aerobic and muscular endurance for everyday tasks.    Baseline 835 feet with 3 wheel walker at PN 9/8    Time 4    Period Weeks    Status Achieved      PT SHORT TERM GOAL #3   Title Patient will complete five times sit to stand test in < 25 seconds indicating an increased LE strength and improved balance.    Baseline 16 sec 9/8 with UE use    Time 4    Period Weeks    Status Achieved    Target Date 06/18/21               PT Long Term Goals - 12/18/21 0953       PT LONG TERM GOAL #1   Title Pt will improve FOTO score to 60 in order to indicate improvement with everyday tasks    Baseline FOTO: 58 om 9/8 (met initial foto goal), 1025: 58% 12/8: 57%, 12/20: 60% 2/7: 56% (previously achieve) 55 on 12/18/21    Time 12    Period Weeks    Status Partially Met    Target Date 01/29/22      PT LONG TERM GOAL #2   Title Patient will complete five times sit to stand test in < 13 seconds indicating an increased LE strength and improved balance.    Baseline 5XSTS in 16 sec with UE use 06/21/21, 10/25: 18 sec with UE use,  15.1 sec on 12/8, 12/19: 14 sec with 1 UE assist; 2/7: 11.75 sec with 1 UE    Time 8    Period Weeks    Status Achieved    Target Date 01/29/22      PT LONG TERM GOAL #3   Title Patient will increase six minute walk test distance to >900 feet for progression to improve gait ability and safety with ambulation    Baseline 10/25: 650 feet with walker, 12/8: 825 with 3WW, 12/19: 715 feet with 3 wheeled walker; 2/7: 670 ft with 3 wheeled walker 3/7:    Time 8    Period Weeks    Status On-going    Target Date 01/29/22      PT LONG TERM GOAL #4   Title Pt will safely demonstrate ability to navigate up and down 4 stairs without assistance in order to improve ability to navigate stairs in her daily life    Baseline able to ascend 1 stair but too reports fear of descending more than 1 stair, 12/8: not assessed, 12/20: not assessed; 2/7:  Pt ascends/descends with BUE support on rails and step-to pattern both ways, CGA provided    Time 6    Period Weeks    Status Achieved    Target Date 01/29/22                   Plan - 12/20/21 1003     Clinical Impression Statement Patient presents to physical therapy with good motivation for  completion of physical therapy program.  Patient did have some pain in her right (contralateral) lower extremity today which was addressed with some manual stretching for pain relief.  Patient demonstrated less tenderness and soreness in her left posterior hip region indicating improvement in signs and symptoms.  Patient does continue to have some pain following prolonged ambulation and mobility.  Patient will continue to benefit from skilled physical therapy intervention in order to improve her lower extremity strength, endurance, pain, and quality of life.    Personal Factors and Comorbidities Age;Comorbidity 1;Transportation    Examination-Activity Limitations Bend;Carry;Squat;Stairs    Examination-Participation Restrictions Cleaning;Laundry    Stability/Clinical  Decision Making Evolving/Moderate complexity    Rehab Potential Good    PT Frequency 2x / week    PT Duration 6 weeks    PT Treatment/Interventions ADLs/Self Care Home Management;Aquatic Therapy;Electrical Stimulation;Gait training;Stair training;Functional mobility training;Therapeutic activities;Therapeutic exercise;Balance training;Neuromuscular re-education;Patient/family education;Prosthetic Training;Manual techniques;Passive range of motion;Energy conservation    PT Next Visit Plan attention to objects on her R side via coordination and balance tasks.    PT Home Exercise Plan Instructed in seated clamshell and provided GTB for HEP    Consulted and Agree with Plan of Care Patient             Patient will benefit from skilled therapeutic intervention in order to improve the following deficits and impairments:  Abnormal gait, Decreased balance, Decreased endurance, Decreased mobility, Decreased activity tolerance  Visit Diagnosis: Pain in left leg  Abnormality of gait and mobility  Difficulty in walking, not elsewhere classified     Problem List Patient Active Problem List   Diagnosis Date Noted   Primary localized osteoarthritis of left hip 03/30/2019   Status post left hip replacement 03/30/2019    Particia Lather, PT 12/20/2021, 12:50 PM  [place] [address] [place], [place], [address] Phone: [phone]   Fax:  [phone]  Name: [name] MRN: [id number] Date of Birth: [birth year]

## 2021-12-25 ENCOUNTER — Other Ambulatory Visit: Payer: Self-pay

## 2021-12-25 ENCOUNTER — Ambulatory Visit: Payer: Medicare Other

## 2021-12-25 DIAGNOSIS — R2689 Other abnormalities of gait and mobility: Secondary | ICD-10-CM | POA: Diagnosis not present

## 2021-12-25 DIAGNOSIS — R278 Other lack of coordination: Secondary | ICD-10-CM | POA: Diagnosis not present

## 2021-12-25 DIAGNOSIS — R262 Difficulty in walking, not elsewhere classified: Secondary | ICD-10-CM

## 2021-12-25 DIAGNOSIS — M79605 Pain in left leg: Secondary | ICD-10-CM

## 2021-12-25 DIAGNOSIS — R2681 Unsteadiness on feet: Secondary | ICD-10-CM

## 2021-12-25 DIAGNOSIS — M6281 Muscle weakness (generalized): Secondary | ICD-10-CM

## 2021-12-25 DIAGNOSIS — R269 Unspecified abnormalities of gait and mobility: Secondary | ICD-10-CM | POA: Diagnosis not present

## 2021-12-25 NOTE — Therapy (Signed)
?[facility] ?[place], [place], [address] ?Phone: [phone]   Fax:  [phone] ? ?Physical Therapy Treatment ? ?Patient Details  ?Name: [name] ?MRN: [id number] ?Date of Birth: 1946/07/15 ?Referring Provider (PT): Merlene Pulling PA-C ? ? ?Encounter Date: 12/25/2021 ? ? PT End of Session - 12/25/21 0945   ? ? Visit Number 57   ? Number of Visits 68   ? Date for PT Re-Evaluation 01/29/22   ? Authorization Time Period recert 34/7/42-02/20/55 Recert 12/19/73 - 6/43/32   ? Progress Note Due on Visit 50   ? PT Start Time [phone]   ? PT Stop Time 1013   ? PT Time Calculation (min) 40 min   ? Equipment Utilized During Treatment Gait belt   ? Activity Tolerance Patient tolerated treatment well;No increased pain   ? Behavior During Therapy [facility] for tasks assessed/performed   ? ?  ?  ? ?  ? ? ?Past Medical History:  ?Diagnosis Date  ? Allergic rhinitis   ? Clotting disorder ([place])   ? Factor 5  ? Diabetes mellitus without complication ([place])   ? no meds-diet controlled  ? Factor 5 Leiden mutation, heterozygous ([place])   ? Family history of adverse reaction to anesthesia   ? Daughter was "paralyzed" for several days after 1 surgery.  ? Heart murmur   ? mild mitral regurg-echo 2010  ? Hyperlipidemia   ? Hypertension   ? Lupus anticoagulant positive   ? Obesity   ? Pre-diabetes   ? Primary localized osteoarthritis of left hip 03/30/2019  ? Stroke [facility]) 2010  ? Some right sided weakness.  Gait issues.  ? Wears dentures   ? partial upper  ? ? ?Past Surgical History:  ?Procedure Laterality Date  ? ABDOMINAL HYSTERECTOMY  1981  ? APPENDECTOMY    ? AXILLARY LYMPH NODE BIOPSY Left 05/16/2014  ? Procedure: EXCISION 2CM LEFT AXILLARY MASS;  Surgeon: [name], MD;  Location: [facility];  Service: General;  Laterality: Left;  Left axillary sebaceous cyst excision  ? CATARACT EXTRACTION W/PHACO Left 04/11/2020  ? Procedure: CATARACT EXTRACTION PHACO AND  INTRAOCULAR LENS PLACEMENT (IOC) LEFT DIABETIC TORIC LENS  6.05 00:34.9;  Surgeon: [name], MD;  Location: [facility];  Service: Ophthalmology;  Laterality: Left;  ? CATARACT EXTRACTION W/PHACO Right 12/12/2020  ? Procedure: CATARACT EXTRACTION PHACO AND INTRAOCULAR LENS PLACEMENT ([place]) RIGHT TORIC LENS;  Surgeon: [name], MD;  Location: [facility];  Service: Ophthalmology;  Laterality: Right;  6.55 ?0:41.1  ? DILATION AND CURETTAGE OF UTERUS    ? SKIN CANCER EXCISION  2016  ? anlke  ? 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  ? Procedure: TOTAL HIP ARTHROPLASTY;  Surgeon: [name], MD;  Location: [facility];  Service: Orthopedics;  Laterality: Left;  ? ? ?There were no vitals filed for this visit. ? ? Subjective Assessment - 12/25/21 0943   ? ? Subjective Patient reports feeling rough today- Dealing with some low back soreness- "Feels like a pinched nerve." Patient reports increased tightness as well in left hip -states "I think it is due to this cold weather."   ? Pertinent History Pt reports she had a stroke in 2010 and was in inpatient rehab for 4 weeks. Pt reports she has been to therapy about one time a year since the onset of her stroke for various ailments including L THA in 2020. Pt reports she has bioness L300 which improved  her drop foot on her R ankle. Pt has depended on her L LE since the stroke due to her weakness and impaired funciton on the right LE. Since the fall she has had throbbing pain in the left LE which is exacerbated with increased weightbearing activity. Pt reports she has had x rays and it shows nothing is broken but despite this she has had continued pain in the left. Pt reports pain is on the lateral aspect of her left LE up into her posterior thigh. Pt reports doctor thinks this could be from nerve related pain but will need PT in order for her insurance to cover MRI of the lumbar spine. Prior to this fall she fell and broke her right wrist.  Pt  reports she can perform any activities above the level of the wast but she is unable to bend over to complete tasks. Pt reports she has multiple wakers, 3 wheel, 4 wheel, wheelchair, standard rolling walker. Pt had brain MRI following fall and no reports of bleeding or issues.   ? Limitations Walking   ? How long can you sit comfortably? a couple hours due to low back pain   ? How long can you stand comfortably? 15 minutes   ? How long can you walk comfortably? Able to walk around [place] but was very fatigued following   ? Currently in Pain? Yes   Right side is spasming = 6/10 and left hip is around 3-4  ? Pain Onset More than a month ago   ? ?  ?  ? ?  ? ? ? ?INTERVENTIONS:  ? ? ?Manual therapy: Performed with patient positioned in supine with moist heat to low back.  ?  ?PT performed passive hamstring stretch to L LE  30 sec hold x4 sets- Progressing angle with each set as patient was very tight this am.  ?Progressed to SLR hamstring stretch with ankle DF/PF neural flossing 4 sets x10 reps LLE only. ?PT performed passive piriformis stretch on LLE with increased tightness noted 30 sec hold x 4 sets ? ?STM to left piriformis x 15 min utilizing initially rolling stick then finger tips to reach area of tightness; Patient exhibits tightness in piriformis as well as gluteus medius and tensor fascia latae which was alleviated by soft tissue mobilization.  ? ?Patient ambulated around 150 feet with 3WW - exhibiting short reciprocal steps.  ?*patient reported feeling much better overall after session and stated her hip was more limber with walking. Rated both right sided low back pain and left hip around 1-2/10.  ? ? ? ? ? ? ? ? ? ? ? ? ? ? ? ? ? ? ? PT Education - 12/25/21 0945   ? ? Education Details Exercise technique   ? Person(s) Educated Patient   ? Methods Explanation;Demonstration;Tactile cues;Verbal cues   ? Comprehension Verbalized understanding;Returned demonstration;Verbal cues required;Tactile cues  required;Need further instruction   ? ?  ?  ? ?  ? ? ? PT Short Term Goals - 11/20/21 0820   ? ?  ? PT SHORT TERM GOAL #1  ? Title Patient will be independent in home exercise program to improve strength/mobility for better functional independence with ADLs.   ? Baseline Met   ? Time 3   ? Period Days   ? Status Achieved   ?  ? PT SHORT TERM GOAL #2  ? Title Pt will be able to complete 6 MWT without requiring rest break or sitting in order to  indicate ipmroved aerobic and muscular endurance for everyday tasks.   ? Baseline 835 feet with 3 wheel walker at PN 9/8   ? Time 4   ? Period Weeks   ? Status Achieved   ?  ? PT SHORT TERM GOAL #3  ? Title Patient will complete five times sit to stand test in < 25 seconds indicating an increased LE strength and improved balance.   ? Baseline 16 sec 9/8 with UE use   ? Time 4   ? Period Weeks   ? Status Achieved   ? Target Date 06/18/21   ? ?  ?  ? ?  ? ? ? ? PT Long Term Goals - 12/18/21 0953   ? ?  ? PT LONG TERM GOAL #1  ? Title Pt will improve FOTO score to 60 in order to indicate improvement with everyday tasks   ? Baseline FOTO: 58 om 9/8 (met initial foto goal), 1025: 58% 12/8: 57%, 12/20: 60% 2/7: 56% (previously achieve) 55 on 12/18/21   ? Time 12   ? Period Weeks   ? Status Partially Met   ? Target Date 01/29/22   ?  ? PT LONG TERM GOAL #2  ? Title Patient will complete five times sit to stand test in < 13 seconds indicating an increased LE strength and improved balance.   ? Baseline 5XSTS in 16 sec with UE use 06/21/21, 10/25: 18 sec with UE use, 15.1 sec on 12/8, 12/19: 14 sec with 1 UE assist; 2/7: 11.75 sec with 1 UE   ? Time 8   ? Period Weeks   ? Status Achieved   ? Target Date 01/29/22   ?  ? PT LONG TERM GOAL #3  ? Title Patient will increase six minute walk test distance to >900 feet for progression to improve gait ability and safety with ambulation   ? Baseline 10/25: 650 feet with walker, 12/8: 825 with 3WW, 12/19: 715 feet with 3 wheeled walker; 2/7: 670 ft  with 3 wheeled walker 3/7:   ? Time 8   ? Period Weeks   ? Status On-going   ? Target Date 01/29/22   ?  ? PT LONG TERM GOAL #4  ? Title Pt will safely demonstrate ability to navigate up and down 4 stairs without assistanc

## 2021-12-26 DIAGNOSIS — Z20822 Contact with and (suspected) exposure to covid-19: Secondary | ICD-10-CM | POA: Diagnosis not present

## 2021-12-27 ENCOUNTER — Ambulatory Visit: Payer: Medicare Other

## 2021-12-27 ENCOUNTER — Other Ambulatory Visit: Payer: Self-pay

## 2021-12-27 DIAGNOSIS — R2681 Unsteadiness on feet: Secondary | ICD-10-CM

## 2021-12-27 DIAGNOSIS — R269 Unspecified abnormalities of gait and mobility: Secondary | ICD-10-CM | POA: Diagnosis not present

## 2021-12-27 DIAGNOSIS — M6281 Muscle weakness (generalized): Secondary | ICD-10-CM | POA: Diagnosis not present

## 2021-12-27 DIAGNOSIS — R262 Difficulty in walking, not elsewhere classified: Secondary | ICD-10-CM | POA: Diagnosis not present

## 2021-12-27 DIAGNOSIS — M79605 Pain in left leg: Secondary | ICD-10-CM

## 2021-12-27 DIAGNOSIS — R2689 Other abnormalities of gait and mobility: Secondary | ICD-10-CM | POA: Diagnosis not present

## 2021-12-27 DIAGNOSIS — R278 Other lack of coordination: Secondary | ICD-10-CM | POA: Diagnosis not present

## 2021-12-27 NOTE — Therapy (Signed)
[facility] ?[place], [place], [address] ?Phone: [phone]   Fax:  [phone] ? ?Physical Therapy Treatment ? ?Patient Details  ?Name: [name] ?MRN: [id number] ?Date of Birth: [birth year] ?Referring Provider (PT): Merlene Pulling PA-C ? ? ?Encounter Date: 12/27/2021 ? ? PT End of Session - 12/27/21 0929   ? ? Visit Number 48   ? Number of Visits 68   ? Date for PT Re-Evaluation 01/29/22   ? Authorization Time Period recert 17/6/16-0/7/37 Recert 1/0/62 - 6/94/85   ? Progress Note Due on Visit 50   ? PT Start Time 4627   ? PT Stop Time 1014   ? PT Time Calculation (min) 42 min   ? Equipment Utilized During Treatment Gait belt   ? Activity Tolerance Patient tolerated treatment well;No increased pain   ? Behavior During Therapy [facility] for tasks assessed/performed   ? ?  ?  ? ?  ? ? ?Past Medical History:  ?Diagnosis Date  ? Allergic rhinitis   ? Clotting disorder ([place])   ? Factor 5  ? Diabetes mellitus without complication ([place])   ? no meds-diet controlled  ? Factor 5 Leiden mutation, heterozygous ([place])   ? Family history of adverse reaction to anesthesia   ? Daughter was "paralyzed" for several days after 1 surgery.  ? Heart murmur   ? mild mitral regurg-echo 2010  ? Hyperlipidemia   ? Hypertension   ? Lupus anticoagulant positive   ? Obesity   ? Pre-diabetes   ? Primary localized osteoarthritis of left hip 03/30/2019  ? Stroke [facility]) 2010  ? Some right sided weakness.  Gait issues.  ? Wears dentures   ? partial upper  ? ? ?Past Surgical History:  ?Procedure Laterality Date  ? ABDOMINAL HYSTERECTOMY  1981  ? APPENDECTOMY    ? AXILLARY LYMPH NODE BIOPSY Left 05/16/2014  ? Procedure: EXCISION 2CM LEFT AXILLARY MASS;  Surgeon: [name], MD;  Location: [facility];  Service: General;  Laterality: Left;  Left axillary sebaceous cyst excision  ? CATARACT EXTRACTION W/PHACO Left 04/11/2020  ? Procedure: CATARACT EXTRACTION PHACO AND  INTRAOCULAR LENS PLACEMENT (IOC) LEFT DIABETIC TORIC LENS  6.05 00:34.9;  Surgeon: [name], MD;  Location: [facility];  Service: Ophthalmology;  Laterality: Left;  ? CATARACT EXTRACTION W/PHACO Right 12/12/2020  ? Procedure: CATARACT EXTRACTION PHACO AND INTRAOCULAR LENS PLACEMENT ([place]) RIGHT TORIC LENS;  Surgeon: [name], MD;  Location: [place];  Service: Ophthalmology;  Laterality: Right;  6.55 ?0:41.1  ? DILATION AND CURETTAGE OF UTERUS    ? SKIN CANCER EXCISION  2016  ? anlke  ? 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  ? Procedure: TOTAL HIP ARTHROPLASTY;  Surgeon: [name], MD;  [facility];  Service: Orthopedics;  Laterality: Left;  ? ? ?There were no vitals filed for this visit. ? ? Subjective Assessment - 12/28/21 0757   ? ? Subjective Patient reports her right sided low back is better today - reports mostly fatigue from long outing to see grandaughters sporting activities yesterday. Reports tightness in left hip but not really pain today.   ? Pertinent History Pt reports she had a stroke in 2010 and was in inpatient rehab for 4 weeks. Pt reports she has been to therapy about one time a year since the onset of her stroke for various ailments including L THA in 2020. Pt reports she has bioness L300 which improved her drop  foot on her R ankle. Pt has depended on her L LE since the stroke due to her weakness and impaired funciton on the right LE. Since the fall she has had throbbing pain in the left LE which is exacerbated with increased weightbearing activity. Pt reports she has had x rays and it shows nothing is broken but despite this she has had continued pain in the left. Pt reports pain is on the lateral aspect of her left LE up into her posterior thigh. Pt reports doctor thinks this could be from nerve related pain but will need PT in order for her insurance to cover MRI of the lumbar spine. Prior to this fall she fell and broke her right wrist.  Pt reports  she can perform any activities above the level of the wast but she is unable to bend over to complete tasks. Pt reports she has multiple wakers, 3 wheel, 4 wheel, wheelchair, standard rolling walker. Pt had brain MRI following fall and no reports of bleeding or issues.   ? Limitations Walking   ? How long can you sit comfortably? a couple hours due to low back pain   ? How long can you stand comfortably? 15 minutes   ? How long can you walk comfortably? Able to walk around [place] but was very fatigued following   ? Currently in Pain? Yes   ? Pain Location Hip   ? Pain Orientation Left;Posterior   ? Pain Descriptors / Indicators Tightness   ? Pain Type Chronic pain   ? Pain Onset More than a month ago   ? ?  ?  ? ?  ? ? ? ? ? ?Manual therapy ?  ?PT performed passive SLR hamstring stretch to L LE  30 sec hold x4 sets ?Progressed to SLR hamstring stretch with ankle DF/PF neural flossing 2 sets x10 reps LLE only; ?PT performed passive single knee to chest stretch 20 sec hold x2 sets Left LE and right lower extremity ?PT performed passive piriformis stretch on right lower extremity and LLE with increased tightness noted 30 sec hold x 2 sets  ?  ?Instructed patient in right sidelying ?Intensive STM to left piriformis x 10 min.  Patient exhibits continued  tightness in piriformis. ?  ?Therex:  ?  ?Sldelying Left Hip abduction 2 x 10, hip in slight flexion throughout but otherwise good form for proper muscle activation  ?  ?Sidelye left clamshell with RTB x 10 reps (no reported pain)  ? ?  ?10 RTB supine clamshells cues for hold times. ? RTB Supine clamshells with Transverse abdominal contraction -Hold 5 sec x 10  ? ?Instructed in seated modified L piriformis stretch - hold 30 sec x2 ? ?Education provided throughout session via VC/TC and demonstration to facilitate movement at target joints and correct muscle activation for all testing and exercises performed.  ?  ?  ? ? ? ? ? ? ? ? ? ? ? ? ? ? ? ? ? ? ? ? ? ? ? PT  Education - 12/28/21 0929   ? ? Education Details Exercise/strething technique   ? Person(s) Educated Patient   ? Methods Explanation;Demonstration;Tactile cues;Verbal cues   ? Comprehension Verbalized understanding;Returned demonstration;Verbal cues required;Tactile cues required;Need further instruction   ? ?  ?  ? ?  ? ? ? PT Short Term Goals - 11/20/21 0820   ? ?  ? PT SHORT TERM GOAL #1  ? Title Patient will be independent in home exercise program  to improve strength/mobility for better functional independence with ADLs.   ? Baseline Met   ? Time 3   ? Period Days   ? Status Achieved   ?  ? PT SHORT TERM GOAL #2  ? Title Pt will be able to complete 6 MWT without requiring rest break or sitting in order to indicate ipmroved aerobic and muscular endurance for everyday tasks.   ? Baseline 835 feet with 3 wheel walker at PN 9/8   ? Time 4   ? Period Weeks   ? Status Achieved   ?  ? PT SHORT TERM GOAL #3  ? Title Patient will complete five times sit to stand test in < 25 seconds indicating an increased LE strength and improved balance.   ? Baseline 16 sec 9/8 with UE use   ? Time 4   ? Period Weeks   ? Status Achieved   ? Target Date 06/18/21   ? ?  ?  ? ?  ? ? ? ? PT Long Term Goals - 12/18/21 0953   ? ?  ? PT LONG TERM GOAL #1  ? Title Pt will improve FOTO score to 60 in order to indicate improvement with everyday tasks   ? Baseline FOTO: 58 om 9/8 (met initial foto goal), 1025: 58% 12/8: 57%, 12/20: 60% 2/7: 56% (previously achieve) 55 on 12/18/21   ? Time 12   ? Period Weeks   ? Status Partially Met   ? Target Date 01/29/22   ?  ? PT LONG TERM GOAL #2  ? Title Patient will complete five times sit to stand test in < 13 seconds indicating an increased LE strength and improved balance.   ? Baseline 5XSTS in 16 sec with UE use 06/21/21, 10/25: 18 sec with UE use, 15.1 sec on 12/8, 12/19: 14 sec with 1 UE assist; 2/7: 11.75 sec with 1 UE   ? Time 8   ? Period Weeks   ? Status Achieved   ? Target Date 01/29/22   ?  ? PT  LONG TERM GOAL #3  ? Title Patient will increase six minute walk test distance to >900 feet for progression to improve gait ability and safety with ambulation   ? Baseline 10/25: 650 feet with walker, 12/8: 825

## 2022-01-01 ENCOUNTER — Ambulatory Visit: Payer: Medicare Other

## 2022-01-03 ENCOUNTER — Ambulatory Visit: Payer: Medicare Other

## 2022-01-03 ENCOUNTER — Other Ambulatory Visit: Payer: Self-pay

## 2022-01-03 DIAGNOSIS — M6281 Muscle weakness (generalized): Secondary | ICD-10-CM

## 2022-01-03 DIAGNOSIS — R2689 Other abnormalities of gait and mobility: Secondary | ICD-10-CM

## 2022-01-03 DIAGNOSIS — R262 Difficulty in walking, not elsewhere classified: Secondary | ICD-10-CM

## 2022-01-03 DIAGNOSIS — R269 Unspecified abnormalities of gait and mobility: Secondary | ICD-10-CM | POA: Diagnosis not present

## 2022-01-03 DIAGNOSIS — R278 Other lack of coordination: Secondary | ICD-10-CM | POA: Diagnosis not present

## 2022-01-03 DIAGNOSIS — R2681 Unsteadiness on feet: Secondary | ICD-10-CM

## 2022-01-03 DIAGNOSIS — M79605 Pain in left leg: Secondary | ICD-10-CM

## 2022-01-03 DIAGNOSIS — M79662 Pain in left lower leg: Secondary | ICD-10-CM

## 2022-01-03 NOTE — Therapy (Signed)
[facility] ?[place], [place], [address] ?Phone: [phone]   Fax:  [phone] ? ?Physical Therapy Treatment ? ?Patient Details  ?Name: [name] ?MRN: [id number] ?Date of Birth: 1946/01/06 ?Referring Provider (PT): Merlene Pulling PA-C ? ? ?Encounter Date: 01/03/2022 ? ? PT End of Session - 01/03/22 0941   ? ? Visit Number 11   ? Number of Visits 68   ? Date for PT Re-Evaluation 01/29/22   ? Authorization Time Period recert 70/7/86-04/18/43 Recert 06/15/00 - 0/07/12   ? Progress Note Due on Visit 50   ? PT Start Time [phone]   ? PT Stop Time 1009   ? PT Time Calculation (min) 36 min   ? Equipment Utilized During Treatment Gait belt   ? Activity Tolerance Patient tolerated treatment well;No increased pain   ? Behavior During Therapy [facility] for tasks assessed/performed   ? ?  ?  ? ?  ? ? ?Past Medical History:  ?Diagnosis Date  ? Allergic rhinitis   ? Clotting disorder ([place])   ? Factor 5  ? Diabetes mellitus without complication ([place])   ? no meds-diet controlled  ? Factor 5 Leiden mutation, heterozygous ([place])   ? Family history of adverse reaction to anesthesia   ? Daughter was "paralyzed" for several days after 1 surgery.  ? Heart murmur   ? mild mitral regurg-echo 2010  ? Hyperlipidemia   ? Hypertension   ? Lupus anticoagulant positive   ? Obesity   ? Pre-diabetes   ? Primary localized osteoarthritis of left hip 03/30/2019  ? Stroke [facility]) 2010  ? Some right sided weakness.  Gait issues.  ? Wears dentures   ? partial upper  ? ? ?Past Surgical History:  ?Procedure Laterality Date  ? ABDOMINAL HYSTERECTOMY  1981  ? APPENDECTOMY    ? AXILLARY LYMPH NODE BIOPSY Left 05/16/2014  ? Procedure: EXCISION 2CM LEFT AXILLARY MASS;  Surgeon: [name], MD;  Location: [facility];  Service: General;  Laterality: Left;  Left axillary sebaceous cyst excision  ? CATARACT EXTRACTION W/PHACO Left 04/11/2020  ? Procedure: CATARACT EXTRACTION PHACO AND  INTRAOCULAR LENS PLACEMENT (IOC) LEFT DIABETIC TORIC LENS  6.05 00:34.9;  Surgeon: [name], MD;  Location: [facility];  Service: Ophthalmology;  Laterality: Left;  ? CATARACT EXTRACTION W/PHACO Right 12/12/2020  ? Procedure: CATARACT EXTRACTION PHACO AND INTRAOCULAR LENS PLACEMENT ([place]) RIGHT TORIC LENS;  Surgeon: [name], MD;  Location: [facility];  Service: Ophthalmology;  Laterality: Right;  6.55 ?0:41.1  ? DILATION AND CURETTAGE OF UTERUS    ? SKIN CANCER EXCISION  2016  ? anlke  ? 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  ? Procedure: TOTAL HIP ARTHROPLASTY;  Surgeon: [name], MD;  Location: [facility];  Service: Orthopedics;  Laterality: Left;  ? ? ?There were no vitals filed for this visit. ? ? Subjective Assessment - 01/03/22 0937   ? ? Subjective Patient reports feeling rough- worried about her sisters health. States her legs are sore from trying to remove her reverse pedals gear that malfunctioned.   ? Pertinent History Pt reports she had a stroke in 2010 and was in inpatient rehab for 4 weeks. Pt reports she has been to therapy about one time a year since the onset of her stroke for various ailments including L THA in 2020. Pt reports she has bioness L300 which improved her drop foot on her R ankle. Pt has depended on  her L LE since the stroke due to her weakness and impaired funciton on the right LE. Since the fall she has had throbbing pain in the left LE which is exacerbated with increased weightbearing activity. Pt reports she has had x rays and it shows nothing is broken but despite this she has had continued pain in the left. Pt reports pain is on the lateral aspect of her left LE up into her posterior thigh. Pt reports doctor thinks this could be from nerve related pain but will need PT in order for her insurance to cover MRI of the lumbar spine. Prior to this fall she fell and broke her right wrist.  Pt reports she can perform any activities above the level of  the wast but she is unable to bend over to complete tasks. Pt reports she has multiple wakers, 3 wheel, 4 wheel, wheelchair, standard rolling walker. Pt had brain MRI following fall and no reports of bleeding or issues.   ? Limitations Walking   ? How long can you sit comfortably? a couple hours due to low back pain   ? How long can you stand comfortably? 15 minutes   ? How long can you walk comfortably? Able to walk around [place] but was very fatigued following   ? Currently in Pain? Yes   ? Pain Score 4    ? Pain Location Hip   ? Pain Orientation Left;Anterior;Posterior   ? Pain Descriptors / Indicators Sore   ? Pain Type Chronic pain   ? Pain Onset More than a month ago   ? ?  ?  ? ?  ? ?INTERVENTIONS:  ? ? ?Moist heat to low back in supine (unbilled) just resting then the following exercises in suipine/sidelye. ? ?Therex: ? ?Bridge x 15 - Patient exhibited very quick reps so VC to slow down and hold bridge x 2 seconds.  ?Bridge with single leg knee ext x 10 each LE ?Sidelye clamshell 2 sets of 10 reps each LE. Minimal ROM with Right LE ?Sidelye hip abd- 2 sets of 10 reps each LE. AAROM with right LE ?Sidelye hip ext- 2 sets fo 10 reps  ? ?Gait in clinic using 3WW= 1st lap in 13m in 18 sec; 2nd lap in 1 min 5 sec.  ? ?Education provided throughout session via VC/TC and demonstration to facilitate movement at target joints and correct muscle activation for all testing and exercises performed.  ? ? ? ? ? ? ? ? ? ? ? ? ? ? ? ? ? ? ? ? ? ? ? ? ? ? ? ? ? PT Education - 01/03/22 0940   ? ? Education Details Exercise technique   ? Person(s) Educated Patient   ? Methods Explanation;Demonstration;Tactile cues;Verbal cues   ? Comprehension Verbalized understanding;Returned demonstration;Verbal cues required;Tactile cues required;Need further instruction   ? ?  ?  ? ?  ? ? ? PT Short Term Goals - 11/20/21 0820   ? ?  ? PT SHORT TERM GOAL #1  ? Title Patient will be independent in home exercise program to improve  strength/mobility for better functional independence with ADLs.   ? Baseline Met   ? Time 3   ? Period Days   ? Status Achieved   ?  ? PT SHORT TERM GOAL #2  ? Title Pt will be able to complete 6 MWT without requiring rest break or sitting in order to indicate ipmroved aerobic and muscular endurance for everyday tasks.   ?  Baseline 835 feet with 3 wheel walker at PN 9/8   ? Time 4   ? Period Weeks   ? Status Achieved   ?  ? PT SHORT TERM GOAL #3  ? Title Patient will complete five times sit to stand test in < 25 seconds indicating an increased LE strength and improved balance.   ? Baseline 16 sec 9/8 with UE use   ? Time 4   ? Period Weeks   ? Status Achieved   ? Target Date 06/18/21   ? ?  ?  ? ?  ? ? ? ? PT Long Term Goals - 12/18/21 0953   ? ?  ? PT LONG TERM GOAL #1  ? Title Pt will improve FOTO score to 60 in order to indicate improvement with everyday tasks   ? Baseline FOTO: 58 om 9/8 (met initial foto goal), 1025: 58% 12/8: 57%, 12/20: 60% 2/7: 56% (previously achieve) 55 on 12/18/21   ? Time 12   ? Period Weeks   ? Status Partially Met   ? Target Date 01/29/22   ?  ? PT LONG TERM GOAL #2  ? Title Patient will complete five times sit to stand test in < 13 seconds indicating an increased LE strength and improved balance.   ? Baseline 5XSTS in 16 sec with UE use 06/21/21, 10/25: 18 sec with UE use, 15.1 sec on 12/8, 12/19: 14 sec with 1 UE assist; 2/7: 11.75 sec with 1 UE   ? Time 8   ? Period Weeks   ? Status Achieved   ? Target Date 01/29/22   ?  ? PT LONG TERM GOAL #3  ? Title Patient will increase six minute walk test distance to >900 feet for progression to improve gait ability and safety with ambulation   ? Baseline 10/25: 650 feet with walker, 12/8: 825 with 3WW, 12/19: 715 feet with 3 wheeled walker; 2/7: 670 ft with 3 wheeled walker 3/7:   ? Time 8   ? Period Weeks   ? Status On-going   ? Target Date 01/29/22   ?  ? PT LONG TERM GOAL #4  ? Title Pt will safely demonstrate ability to navigate up and down 4  stairs without assistance in order to improve ability to navigate stairs in her daily life   ? Baseline able to ascend 1 stair but too reports fear of descending more than 1 stair, 12/8: not assessed, 12/20: not ass

## 2022-01-08 ENCOUNTER — Ambulatory Visit: Payer: Medicare Other

## 2022-01-08 ENCOUNTER — Other Ambulatory Visit: Payer: Self-pay

## 2022-01-08 DIAGNOSIS — M79605 Pain in left leg: Secondary | ICD-10-CM

## 2022-01-08 DIAGNOSIS — R2689 Other abnormalities of gait and mobility: Secondary | ICD-10-CM

## 2022-01-08 DIAGNOSIS — R269 Unspecified abnormalities of gait and mobility: Secondary | ICD-10-CM

## 2022-01-08 DIAGNOSIS — M6281 Muscle weakness (generalized): Secondary | ICD-10-CM

## 2022-01-08 DIAGNOSIS — R278 Other lack of coordination: Secondary | ICD-10-CM | POA: Diagnosis not present

## 2022-01-08 DIAGNOSIS — R262 Difficulty in walking, not elsewhere classified: Secondary | ICD-10-CM | POA: Diagnosis not present

## 2022-01-08 DIAGNOSIS — R2681 Unsteadiness on feet: Secondary | ICD-10-CM

## 2022-01-08 NOTE — Therapy (Signed)
[place] ?[place], [place], [address] ?Phone: [phone]   Fax:  [phone] ? ?Physical Therapy Treatment/Physical Therapy Progress Note ? ? ?Dates of reporting period  11/20/2021  to  01/08/2022 ? ?Patient Details  ?Name: [name] ?MRN: [id number] ?Date of Birth: 12-19-1945 ?Referring Provider (PT): Merlene Pulling PA-C ? ? ?Encounter Date: 01/08/2022 ? ? PT End of Session - 01/08/22 0940   ? ? Visit Number 50   ? Number of Visits 68   ? Date for PT Re-Evaluation 01/29/22   ? Authorization Time Period recert 82/5/05-12/21/74 Recert 04/15/40 - 9/37/90   ? Progress Note Due on Visit 50   ? PT Start Time 2409   ? PT Stop Time 1014   ? PT Time Calculation (min) 39 min   ? Equipment Utilized During Treatment Gait belt   ? Activity Tolerance Patient tolerated treatment well;No increased pain   ? Behavior During Therapy [facility] for tasks assessed/performed   ? ?  ?  ? ?  ? ? ?Past Medical History:  ?Diagnosis Date  ? Allergic rhinitis   ? Clotting disorder ([place])   ? Factor 5  ? Diabetes mellitus without complication ([place])   ? no meds-diet controlled  ? Factor 5 Leiden mutation, heterozygous ([place])   ? Family history of adverse reaction to anesthesia   ? Daughter was "paralyzed" for several days after 1 surgery.  ? Heart murmur   ? mild mitral regurg-echo 2010  ? Hyperlipidemia   ? Hypertension   ? Lupus anticoagulant positive   ? Obesity   ? Pre-diabetes   ? Primary localized osteoarthritis of left hip 03/30/2019  ? Stroke [facility]) 2010  ? Some right sided weakness.  Gait issues.  ? Wears dentures   ? partial upper  ? ? ?Past Surgical History:  ?Procedure Laterality Date  ? ABDOMINAL HYSTERECTOMY  1981  ? APPENDECTOMY    ? AXILLARY LYMPH NODE BIOPSY Left 05/16/2014  ? Procedure: EXCISION 2CM LEFT AXILLARY MASS;  Surgeon: [name], MD;  Location: [facility];  Service: General;  Laterality: Left;  Left axillary sebaceous cyst excision  ?  CATARACT EXTRACTION W/PHACO Left 04/11/2020  ? Procedure: CATARACT EXTRACTION PHACO AND INTRAOCULAR LENS PLACEMENT (IOC) LEFT DIABETIC TORIC LENS  6.05 00:34.9;  Surgeon: [name], MD;  Location: [facility];  Service: Ophthalmology;  Laterality: Left;  ? CATARACT EXTRACTION W/PHACO Right 12/12/2020  ? Procedure: CATARACT EXTRACTION PHACO AND INTRAOCULAR LENS PLACEMENT ([place]) RIGHT TORIC LENS;  Surgeon: [name], MD;  Location: [facility];  Service: Ophthalmology;  Laterality: Right;  6.55 ?0:41.1  ? DILATION AND CURETTAGE OF UTERUS    ? SKIN CANCER EXCISION  2016  ? anlke  ? 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  ? Procedure: TOTAL HIP ARTHROPLASTY;  Surgeon: [name], MD;  [facility];  Service: Orthopedics;  Laterality: Left;  ? ? ?There were no vitals filed for this visit. ? ? Subjective Assessment - 01/08/22 1036   ? ? Subjective Patient reports feeling okay- Stronger and less painful overall but reports sad this week due to the passing of her sister last week.   ? Pertinent History Pt reports she had a stroke in 2010 and was in inpatient rehab for 4 weeks. Pt reports she has been to therapy about one time a year since the onset of her stroke for various ailments including L THA in 2020. Pt reports she has bioness  L300 which improved her drop foot on her R ankle. Pt has depended on her L LE since the stroke due to her weakness and impaired funciton on the right LE. Since the fall she has had throbbing pain in the left LE which is exacerbated with increased weightbearing activity. Pt reports she has had x rays and it shows nothing is broken but despite this she has had continued pain in the left. Pt reports pain is on the lateral aspect of her left LE up into her posterior thigh. Pt reports doctor thinks this could be from nerve related pain but will need PT in order for her insurance to cover MRI of the lumbar spine. Prior to this fall she fell and broke her right  wrist.  Pt reports she can perform any activities above the level of the wast but she is unable to bend over to complete tasks. Pt reports she has multiple wakers, 3 wheel, 4 wheel, wheelchair, standard rolling walker. Pt had brain MRI following fall and no reports of bleeding or issues.   ? Limitations Walking   ? How long can you sit comfortably? a couple hours due to low back pain   ? How long can you stand comfortably? 15 minutes   ? How long can you walk comfortably? Able to walk around [place] but was very fatigued following   ? Currently in Pain? No/denies   ? Pain Onset More than a month ago   ? ?  ?  ? ?  ? ? ? ? ? ? ? ? ? ? ? ?INTERVENTIONS:  ?  ?  ?Moist heat to low back in supine (unbilled) just resting then the following exercises in suipine/sidelye. ? ?Manual Therapy: ? ?PT performed passive SLR hamstring stretch to L LE  30 sec hold x4 sets ?Progressed to SLR hamstring stretch with ankle DF/PF neural flossing 2 sets x10 reps LLE only; ?PT performed passive single knee to chest stretch 20 sec hold x2 sets Left LE and right lower extremity ?PT performed passive piriformis stretch on right lower extremity and LLE with increased tightness noted 30 sec hold x 2 sets  ?  ?Instructed patient in right sidelying ?Intensive STM to left piriformis x 10 min.  Patient exhibits continued  tightness in piriformis ? ?Therex: ? ?6 min Walk test =735 feet with 3WW ? ?Review of hip strengthening- Verbally reviewed seated hip abd (instructed to use her theraband) ?Patient performed seated hip flex/abd- up and over dumbell x 15 reps each LE.  ?  ? ?  ?  ?Education provided throughout session via VC/TC and demonstration to facilitate movement at target joints and correct muscle activation for all testing and exercises performed.  ? ? ?    ? ? ? ? ? ? ? ? ? ? ? ? ? ? PT Education - 01/08/22 1035   ? ? Education Details Exercise technique   ? Person(s) Educated Patient   ? Methods Explanation;Demonstration;Tactile  cues;Verbal cues   ? Comprehension Verbalized understanding;Returned demonstration;Verbal cues required;Need further instruction;Tactile cues required   ? ?  ?  ? ?  ? ? ? PT Short Term Goals - 11/20/21 0820   ? ?  ? PT SHORT TERM GOAL #1  ? Title Patient will be independent in home exercise program to improve strength/mobility for better functional independence with ADLs.   ? Baseline Met   ? Time 3   ? Period Days   ? Status Achieved   ?  ?  PT SHORT TERM GOAL #2  ? Title Pt will be able to complete 6 MWT without requiring rest break or sitting in order to indicate ipmroved aerobic and muscular endurance for everyday tasks.   ? Baseline 835 feet with 3 wheel walker at PN 9/8   ? Time 4   ? Period Weeks   ? Status Achieved   ?  ? PT SHORT TERM GOAL #3  ? Title Patient will complete five times sit to stand test in < 25 seconds indicating an increased LE strength and improved balance.   ? Baseline 16 sec 9/8 with UE use   ? Time 4   ? Period Weeks   ? Status Achieved   ? Target Date 06/18/21   ? ?  ?  ? ?  ? ? ? ? PT Long Term Goals - 01/08/22 1005   ? ?  ? PT LONG TERM GOAL #1  ? Title Pt will improve FOTO score to 60 in order to indicate improvement with everyday tasks   ? Baseline FOTO: 58 om 9/8 (met initial foto goal), 1025: 58% 12/8: 57%, 12/20: 60% 2/7: 56% (previously achieve) 55 on 12/18/21   ? Time 12   ? Period Weeks   ? Status Partially Met   ? Target Date 01/29/22   ?  ? PT LONG TERM GOAL #2  ? Title Patient will complete five times sit to stand test in < 13 seconds indicating an increased LE strength and improved balance.   ? Baseline 5XSTS in 16 sec with UE use 06/21/21, 10/25: 18 sec with UE use, 15.1 sec on 12/8, 12/19: 14 sec with 1 UE assist; 2/7: 11.75 sec with 1 UE   ? Time 8   ? Period Weeks   ? Status Achieved   ? Target Date 01/29/22   ?  ? PT LONG TERM GOAL #3  ? Title Patient will increase six minute walk test distance to >900 feet for progression to improve gait ability and safety with  ambulation   ? Baseline 10/25: 650 feet with walker, 12/8: 825 with 3WW, 12/19: 715 feet with 3 wheeled walker; 2/7: 670 ft with 3 wheeled walker 01/08/2022= 735 feet   ? Time 8   ? Period Weeks   ? Status On-going   ? Target Da

## 2022-01-10 ENCOUNTER — Ambulatory Visit: Payer: Medicare Other

## 2022-01-10 DIAGNOSIS — M79605 Pain in left leg: Secondary | ICD-10-CM | POA: Diagnosis not present

## 2022-01-10 DIAGNOSIS — R278 Other lack of coordination: Secondary | ICD-10-CM | POA: Diagnosis not present

## 2022-01-10 DIAGNOSIS — R2689 Other abnormalities of gait and mobility: Secondary | ICD-10-CM

## 2022-01-10 DIAGNOSIS — M6281 Muscle weakness (generalized): Secondary | ICD-10-CM

## 2022-01-10 DIAGNOSIS — R262 Difficulty in walking, not elsewhere classified: Secondary | ICD-10-CM | POA: Diagnosis not present

## 2022-01-10 DIAGNOSIS — R269 Unspecified abnormalities of gait and mobility: Secondary | ICD-10-CM | POA: Diagnosis not present

## 2022-01-10 NOTE — Therapy (Signed)
[place] ?[place], [place], [address] ?Phone: [phone]   Fax:  [phone] ? ?Physical Therapy Treatment ? ?Patient Details  ?Name: [name] ?MRN: [id number] ?Date of Birth: [birth year] ?Referring Provider (PT): Merlene Pulling PA-C ? ? ?Encounter Date: 01/10/2022 ? ? PT End of Session - 01/10/22 1026   ? ? Visit Number 51   ? Number of Visits 68   ? Date for PT Re-Evaluation 01/29/22   ? Authorization Time Period recert 67/6/72-0/9/47 Recert 0/9/62 - 8/36/62   ? Progress Note Due on Visit 50   ? PT Start Time [phone]   ? PT Stop Time 1017   ? PT Time Calculation (min) 40 min   ? Equipment Utilized During Treatment Gait belt   ? Activity Tolerance Patient tolerated treatment well;No increased pain   ? Behavior During Therapy [facility] for tasks assessed/performed   ? ?  ?  ? ?  ? ? ?Past Medical History:  ?Diagnosis Date  ? Allergic rhinitis   ? Clotting disorder ([place])   ? Factor 5  ? Diabetes mellitus without complication ([place])   ? no meds-diet controlled  ? Factor 5 Leiden mutation, heterozygous ([place])   ? Family history of adverse reaction to anesthesia   ? Daughter was "paralyzed" for several days after 1 surgery.  ? Heart murmur   ? mild mitral regurg-echo 2010  ? Hyperlipidemia   ? Hypertension   ? Lupus anticoagulant positive   ? Obesity   ? Pre-diabetes   ? Primary localized osteoarthritis of left hip 03/30/2019  ? Stroke [facility]) 2010  ? Some right sided weakness.  Gait issues.  ? Wears dentures   ? partial upper  ? ? ?Past Surgical History:  ?Procedure Laterality Date  ? ABDOMINAL HYSTERECTOMY  1981  ? APPENDECTOMY    ? AXILLARY LYMPH NODE BIOPSY Left 05/16/2014  ? Procedure: EXCISION 2CM LEFT AXILLARY MASS;  Surgeon: [name], MD;  Location: [place];  Service: General;  Laterality: Left;  Left axillary sebaceous cyst excision  ? CATARACT EXTRACTION W/PHACO Left 04/11/2020  ? Procedure: CATARACT EXTRACTION PHACO AND  INTRAOCULAR LENS PLACEMENT (IOC) LEFT DIABETIC TORIC LENS  6.05 00:34.9;  Surgeon: [name], MD;  Location: [place];  Service: Ophthalmology;  Laterality: Left;  ? CATARACT EXTRACTION W/PHACO Right 12/12/2020  ? Procedure: CATARACT EXTRACTION PHACO AND INTRAOCULAR LENS PLACEMENT ([place]) RIGHT TORIC LENS;  Surgeon: [name], MD;  Location: [place];  Service: Ophthalmology;  Laterality: Right;  6.55 ?0:41.1  ? DILATION AND CURETTAGE OF UTERUS    ? SKIN CANCER EXCISION  2016  ? anlke  ? 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  ? Procedure: TOTAL HIP ARTHROPLASTY;  Surgeon: [name], MD;  [facility];  Service: Orthopedics;  Laterality: Left;  ? ? ?There were no vitals filed for this visit. ? ? Subjective Assessment - 01/10/22 0940   ? ? Subjective Pt reports some soreness in hip from exercises. She reports her exercises are going well at home. She reports no stumbles/falls.   ? Pertinent History Pt reports she had a stroke in 2010 and was in inpatient rehab for 4 weeks. Pt reports she has been to therapy about one time a year since the onset of her stroke for various ailments including L THA in 2020. Pt reports she has bioness L300 which improved her drop foot on her R ankle. Pt has depended on her L LE  since the stroke due to her weakness and impaired funciton on the right LE. Since the fall she has had throbbing pain in the left LE which is exacerbated with increased weightbearing activity. Pt reports she has had x rays and it shows nothing is broken but despite this she has had continued pain in the left. Pt reports pain is on the lateral aspect of her left LE up into her posterior thigh. Pt reports doctor thinks this could be from nerve related pain but will need PT in order for her insurance to cover MRI of the lumbar spine. Prior to this fall she fell and broke her right wrist.  Pt reports she can perform any activities above the level of the wast but she is unable  to bend over to complete tasks. Pt reports she has multiple wakers, 3 wheel, 4 wheel, wheelchair, standard rolling walker. Pt had brain MRI following fall and no reports of bleeding or issues.   ? Limitations Walking   ? How long can you sit comfortably? a couple hours due to low back pain   ? How long can you stand comfortably? 15 minutes   ? How long can you walk comfortably? Able to walk around [place] but was very fatigued following   ? Currently in Pain? No/denies   ? Pain Location Hip   ? Pain Orientation Left   ? Pain Onset More than a month ago   ? ?  ?  ? ?  ? ? ?INTERVENTIONS:  ?   ?Therex: ? ?STS 3x8 cuing for LE positioning to promote equal BLE weightbearing. Pt rates as fatiguing. ? ?Standing hip abduction 3x10 each LE ? ?Standing march 2x20 alternating LE ? ?Standing hip extension 3x10 each LE. Cuing to decrease/control speed. ? ?Standing heel raises 20x B; rates easy ? ?Pt hooklying on plinth - ?Hamstring stretch 2x60 sec each LE ?Glute bridge 3x10  ? ?Observation of gait before and prior to interventions. 2x148 ft total with 3WW. Pt gait antalgic throughout.  ?  ?Education provided throughout session via VC/TC and demonstration to facilitate movement at target joints and correct muscle activation for all testing and exercises performed.  ? ? PT Education - 01/10/22 1025   ? ? Education Details exercise technique, body mechanics   ? Person(s) Educated Patient   ? Methods Explanation;Demonstration;Tactile cues;Verbal cues   ? Comprehension Verbalized understanding;Returned demonstration;Need further instruction   ? ?  ?  ? ?  ? ? ? PT Short Term Goals - 11/20/21 0820   ? ?  ? PT SHORT TERM GOAL #1  ? Title Patient will be independent in home exercise program to improve strength/mobility for better functional independence with ADLs.   ? Baseline Met   ? Time 3   ? Period Days   ? Status Achieved   ?  ? PT SHORT TERM GOAL #2  ? Title Pt will be able to complete 6 MWT without requiring rest break or  sitting in order to indicate ipmroved aerobic and muscular endurance for everyday tasks.   ? Baseline 835 feet with 3 wheel walker at PN 9/8   ? Time 4   ? Period Weeks   ? Status Achieved   ?  ? PT SHORT TERM GOAL #3  ? Title Patient will complete five times sit to stand test in < 25 seconds indicating an increased LE strength and improved balance.   ? Baseline 16 sec 9/8 with UE use   ? Time 4   ?  Period Weeks   ? Status Achieved   ? Target Date 06/18/21   ? ?  ?  ? ?  ? ? ? ? PT Long Term Goals - 01/08/22 1005   ? ?  ? PT LONG TERM GOAL #1  ? Title Pt will improve FOTO score to 60 in order to indicate improvement with everyday tasks   ? Baseline FOTO: 58 om 9/8 (met initial foto goal), 1025: 58% 12/8: 57%, 12/20: 60% 2/7: 56% (previously achieve) 55 on 12/18/21   ? Time 12   ? Period Weeks   ? Status Partially Met   ? Target Date 01/29/22   ?  ? PT LONG TERM GOAL #2  ? Title Patient will complete five times sit to stand test in < 13 seconds indicating an increased LE strength and improved balance.   ? Baseline 5XSTS in 16 sec with UE use 06/21/21, 10/25: 18 sec with UE use, 15.1 sec on 12/8, 12/19: 14 sec with 1 UE assist; 2/7: 11.75 sec with 1 UE   ? Time 8   ? Period Weeks   ? Status Achieved   ? Target Date 01/29/22   ?  ? PT LONG TERM GOAL #3  ? Title Patient will increase six minute walk test distance to >900 feet for progression to improve gait ability and safety with ambulation   ? Baseline 10/25: 650 feet with walker, 12/8: 825 with 3WW, 12/19: 715 feet with 3 wheeled walker; 2/7: 670 ft with 3 wheeled walker 01/08/2022= 735 feet   ? Time 8   ? Period Weeks   ? Status On-going   ? Target Date 01/29/22   ?  ? PT LONG TERM GOAL #4  ? Title Pt will safely demonstrate ability to navigate up and down 4 stairs without assistance in order to improve ability to navigate stairs in her daily life   ? Baseline able to ascend 1 stair but too reports fear of descending more than 1 stair, 12/8: not assessed, 12/20: not  assessed; 2/7:  Pt ascends/descends with BUE support on rails and step-to pattern both ways, CGA provided   ? Time 6   ? Period Weeks   ? Status Achieved   ? Target Date 01/29/22   ? ?  ?  ? ?  ? ? ? ? ? ? ? ? Plan

## 2022-01-11 DIAGNOSIS — Z20822 Contact with and (suspected) exposure to covid-19: Secondary | ICD-10-CM | POA: Diagnosis not present

## 2022-01-15 ENCOUNTER — Ambulatory Visit: Payer: Medicare Other | Attending: Internal Medicine | Admitting: Physical Therapy

## 2022-01-15 DIAGNOSIS — R262 Difficulty in walking, not elsewhere classified: Secondary | ICD-10-CM | POA: Diagnosis not present

## 2022-01-15 DIAGNOSIS — M79605 Pain in left leg: Secondary | ICD-10-CM | POA: Diagnosis not present

## 2022-01-15 DIAGNOSIS — R269 Unspecified abnormalities of gait and mobility: Secondary | ICD-10-CM | POA: Diagnosis not present

## 2022-01-15 DIAGNOSIS — R2689 Other abnormalities of gait and mobility: Secondary | ICD-10-CM | POA: Insufficient documentation

## 2022-01-15 NOTE — Therapy (Signed)
[place] [facility] ?[place], [place], [address] ?Phone: [phone]   Fax:  [phone] ? ?Physical Therapy Treatment ? ?Patient Details  ?Name: [name] ?MRN: [id number] ?Date of Birth: May 18, 1946 ?Referring Provider (PT): Merlene Pulling PA-C ? ? ?Encounter Date: 01/15/2022 ? ? PT End of Session - 01/15/22 1054   ? ? Visit Number 43   ? Number of Visits 68   ? Date for PT Re-Evaluation 01/29/22   ? Authorization Time Period recert 17/6/16-0/7/37 Recert 1/0/62 - 6/94/85   ? Progress Note Due on Visit 50   ? PT Start Time 4627   ? PT Stop Time 1015   ? PT Time Calculation (min) 43 min   ? Equipment Utilized During Treatment Gait belt   ? Activity Tolerance Patient tolerated treatment well;No increased pain   ? Behavior During Therapy [facility] for tasks assessed/performed   ? ?  ?  ? ?  ? ? ?Past Medical History:  ?Diagnosis Date  ? Allergic rhinitis   ? Clotting disorder ([place])   ? Factor 5  ? Diabetes mellitus without complication ([place])   ? no meds-diet controlled  ? Factor 5 Leiden mutation, heterozygous ([place])   ? Family history of adverse reaction to anesthesia   ? Daughter was "paralyzed" for several days after 1 surgery.  ? Heart murmur   ? mild mitral regurg-echo 2010  ? Hyperlipidemia   ? Hypertension   ? Lupus anticoagulant positive   ? Obesity   ? Pre-diabetes   ? Primary localized osteoarthritis of left hip 03/30/2019  ? Stroke [facility]) 2010  ? Some right sided weakness.  Gait issues.  ? Wears dentures   ? partial upper  ? ? ?Past Surgical History:  ?Procedure Laterality Date  ? ABDOMINAL HYSTERECTOMY  1981  ? APPENDECTOMY    ? AXILLARY LYMPH NODE BIOPSY Left 05/16/2014  ? Procedure: EXCISION 2CM LEFT AXILLARY MASS;  Surgeon: [name], MD;  Location: [place];  Service: General;  Laterality: Left;  Left axillary sebaceous cyst excision  ? CATARACT EXTRACTION W/PHACO Left 04/11/2020  ? Procedure: CATARACT EXTRACTION PHACO AND  INTRAOCULAR LENS PLACEMENT (IOC) LEFT DIABETIC TORIC LENS  6.05 00:34.9;  Surgeon: [name], MD;  Location: [place];  Service: Ophthalmology;  Laterality: Left;  ? CATARACT EXTRACTION W/PHACO Right 12/12/2020  ? Procedure: CATARACT EXTRACTION PHACO AND INTRAOCULAR LENS PLACEMENT ([place]) RIGHT TORIC LENS;  Surgeon: [name], MD;  Location: [place];  Service: Ophthalmology;  Laterality: Right;  6.55 ?0:41.1  ? DILATION AND CURETTAGE OF UTERUS    ? SKIN CANCER EXCISION  2016  ? anlke  ? 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  ? Procedure: TOTAL HIP ARTHROPLASTY;  Surgeon: [name], MD;  [facility];  Service: Orthopedics;  Laterality: Left;  ? ? ?There were no vitals filed for this visit. ? ? Subjective Assessment - 01/15/22 1053   ? ? Subjective Patient reports pain in her right posterior hip region following physical therapy activities last Thursday.  Patient reports no falls or loss of balance or other significant changes since previous session.   ? Pertinent History Pt reports she had a stroke in 2010 and was in inpatient rehab for 4 weeks. Pt reports she has been to therapy about one time a year since the onset of her stroke for various ailments including L THA in 2020. Pt reports she has bioness L300 which improved her drop foot on  her R ankle. Pt has depended on her L LE since the stroke due to her weakness and impaired funciton on the right LE. Since the fall she has had throbbing pain in the left LE which is exacerbated with increased weightbearing activity. Pt reports she has had x rays and it shows nothing is broken but despite this she has had continued pain in the left. Pt reports pain is on the lateral aspect of her left LE up into her posterior thigh. Pt reports doctor thinks this could be from nerve related pain but will need PT in order for her insurance to cover MRI of the lumbar spine. Prior to this fall she fell and broke her right wrist.  Pt reports  she can perform any activities above the level of the wast but she is unable to bend over to complete tasks. Pt reports she has multiple wakers, 3 wheel, 4 wheel, wheelchair, standard rolling walker. Pt had brain MRI following fall and no reports of bleeding or issues.   ? Limitations Walking   ? How long can you sit comfortably? a couple hours due to low back pain   ? How long can you stand comfortably? 15 minutes   ? How long can you walk comfortably? Able to walk around [place] but was very fatigued following   ? Currently in Pain? Yes   ? Pain Score 5    ? Pain Location Hip   ? Pain Orientation Right   ? Pain Descriptors / Indicators Sore;Aching   ? Pain Type Acute pain   ? Pain Onset In the past 7 days   ? Pain Frequency Intermittent   ? ?  ?  ? ?  ? ? ?TREATMENT: ? ? ?Manual therapy ?  ?PT performed passive SLR hamstring stretch to L and R  LE  30 sec hold x4 sets ?Progressed to SLR hamstring stretch with ankle DF/PF neural flossing 2 sets x10 reps LLE only; ?PT performed passive single knee to chest stretch 20 sec hold x2 sets Left LE and right lower extremity ?PT performed passive piriformis stretch on right lower extremity and LLE with increased tightness noted 30 sec hold x 2 sets ? ?  ?Instructed patient in right sidelying ?Intensive STM to left piriformis x 12 min utilizing rolling stick; Patient exhibits tightness in piriformis as well as gluteus medius and tensor fascia latae which was alleviated by IASTM  ?Following manual therapy patient exhibits better tissue extensibility with less tightness and less trigger points ?Transitioned to L SL, addressed trigger points in R Posterior hop region with rolling stick/ IASTM and pt reports improved pain following.  ? ?Therex:  ? ?Sldelying Left Hip abduction 2 x 10, hip in slight flexion throughout but otherwise good form for proper muscle activation  ? ?bridge with ball squeeze 2 x 10 . ? ?2 x 10 GTB supine clamshells cues for hold times. ? ? ?Pt educated  throughout session about proper posture and technique with exercises. Improved exercise technique, movement at target joints, use of target muscles after min to mod verbal, visual, tactile cues. ? ? ? ? ? ? ? ? ? ? ? ? ? ? ? ? ? ? ? ? ? ? ? ? ? ? ? ? ? PT Education - 01/15/22 1054   ? ? Education Details Exercise technique, Arts development officer   ? Person(s) Educated Patient   ? Methods Explanation   ? Comprehension Verbalized understanding   ? ?  ?  ? ?  ? ? ?  PT Short Term Goals - 11/20/21 0820   ? ?  ? PT SHORT TERM GOAL #1  ? Title Patient will be independent in home exercise program to improve strength/mobility for better functional independence with ADLs.   ? Baseline Met   ? Time 3   ? Period Days   ? Status Achieved   ?  ? PT SHORT TERM GOAL #2  ? Title Pt will be able to complete 6 MWT without requiring rest break or sitting in order to indicate ipmroved aerobic and muscular endurance for everyday tasks.   ? Baseline 835 feet with 3 wheel walker at PN 9/8   ? Time 4   ? Period Weeks   ? Status Achieved   ?  ? PT SHORT TERM GOAL #3  ? Title Patient will complete five times sit to stand test in < 25 seconds indicating an increased LE strength and improved balance.   ? Baseline 16 sec 9/8 with UE use   ? Time 4   ? Period Weeks   ? Status Achieved   ? Target Date 06/18/21   ? ?  ?  ? ?  ? ? ? ? PT Long Term Goals - 01/08/22 1005   ? ?  ? PT LONG TERM GOAL #1  ? Title Pt will improve FOTO score to 60 in order to indicate improvement with everyday tasks   ? Baseline FOTO: 58 om 9/8 (met initial foto goal), 1025: 58% 12/8: 57%, 12/20: 60% 2/7: 56% (previously achieve) 55 on 12/18/21   ? Time 12   ? Period Weeks   ? Status Partially Met   ? Target Date 01/29/22   ?  ? PT LONG TERM GOAL #2  ? Title Patient will complete five times sit to stand test in < 13 seconds indicating an increased LE strength and improved balance.   ? Baseline 5XSTS in 16 sec with UE use 06/21/21, 10/25: 18 sec with UE use, 15.1 sec on 12/8, 12/19: 14  sec with 1 UE assist; 2/7: 11.75 sec with 1 UE   ? Time 8   ? Period Weeks   ? Status Achieved   ? Target Date 01/29/22   ?  ? PT LONG TERM GOAL #3  ? Title Patient will increase six minute walk test distance

## 2022-01-17 ENCOUNTER — Ambulatory Visit: Payer: Medicare Other | Admitting: Physical Therapy

## 2022-01-17 DIAGNOSIS — R269 Unspecified abnormalities of gait and mobility: Secondary | ICD-10-CM

## 2022-01-17 DIAGNOSIS — R2689 Other abnormalities of gait and mobility: Secondary | ICD-10-CM | POA: Diagnosis not present

## 2022-01-17 DIAGNOSIS — M79605 Pain in left leg: Secondary | ICD-10-CM

## 2022-01-17 DIAGNOSIS — R262 Difficulty in walking, not elsewhere classified: Secondary | ICD-10-CM | POA: Diagnosis not present

## 2022-01-17 NOTE — Therapy (Signed)
[facility] ?[place], [place], [address] ?Phone: [phone]   Fax:  [phone] ? ?Physical Therapy Treatment ? ?Patient Details  ?Name: [name] ?MRN: [id number] ?Date of Birth: March 22, 1946 ?Referring Provider (PT): Merlene Pulling PA-C ? ? ?Encounter Date: 01/17/2022 ? ? PT End of Session - 01/17/22 0949   ? ? Visit Number [id number]   ? Number of Visits 68   ? Date for PT Re-Evaluation 01/29/22   ? Authorization Type Medicare   ? Authorization Time Period recert 54/6/27-0/3/50 Recert 0/9/38 - 1/82/99   ? Progress Note Due on Visit 60   ? PT Start Time [phone]   ? PT Stop Time 1015   ? PT Time Calculation (min) 42 min   ? Equipment Utilized During Treatment Gait belt   ? Activity Tolerance Patient tolerated treatment well;No increased pain   ? Behavior During Therapy [facility] for tasks assessed/performed   ? ?  ?  ? ?  ? ? ?Past Medical History:  ?Diagnosis Date  ? Allergic rhinitis   ? Clotting disorder ([place])   ? Factor 5  ? Diabetes mellitus without complication ([place])   ? no meds-diet controlled  ? Factor 5 Leiden mutation, heterozygous ([place])   ? Family history of adverse reaction to anesthesia   ? Daughter was "paralyzed" for several days after 1 surgery.  ? Heart murmur   ? mild mitral regurg-echo 2010  ? Hyperlipidemia   ? Hypertension   ? Lupus anticoagulant positive   ? Obesity   ? Pre-diabetes   ? Primary localized osteoarthritis of left hip 03/30/2019  ? Stroke [facility]) 2010  ? Some right sided weakness.  Gait issues.  ? Wears dentures   ? partial upper  ? ? ?Past Surgical History:  ?Procedure Laterality Date  ? ABDOMINAL HYSTERECTOMY  1981  ? APPENDECTOMY    ? AXILLARY LYMPH NODE BIOPSY Left 05/16/2014  ? Procedure: EXCISION 2CM LEFT AXILLARY MASS;  Surgeon: [name], MD;  Location: [facility];  Service: General;  Laterality: Left;  Left axillary sebaceous cyst excision  ? CATARACT EXTRACTION W/PHACO Left 04/11/2020  ? Procedure:  CATARACT EXTRACTION PHACO AND INTRAOCULAR LENS PLACEMENT (IOC) LEFT DIABETIC TORIC LENS  6.05 00:34.9;  Surgeon: [name], MD;  Location: [place];  Service: Ophthalmology;  Laterality: Left;  ? CATARACT EXTRACTION W/PHACO Right 12/12/2020  ? Procedure: CATARACT EXTRACTION PHACO AND INTRAOCULAR LENS PLACEMENT ([place]) RIGHT TORIC LENS;  Surgeon: [name], MD;  Location: [facility];  Service: Ophthalmology;  Laterality: Right;  6.55 ?0:41.1  ? DILATION AND CURETTAGE OF UTERUS    ? SKIN CANCER EXCISION  2016  ? anlke  ? 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  ? Procedure: TOTAL HIP ARTHROPLASTY;  Surgeon: [name], MD;  [facility]: [facility];  Service: Orthopedics;  Laterality: Left;  ? ? ?There were no vitals filed for this visit. ? ? Subjective Assessment - 01/17/22 0948   ? ? Subjective Pt reports no pain in  either of her hips this date. Report she will have her bioness adjusted next week.   ? Pertinent History Pt reports she had a stroke in 2010 and was in inpatient rehab for 4 weeks. Pt reports she has been to therapy about one time a year since the onset of her stroke for various ailments including L THA in 2020. Pt reports she has bioness L300 which improved her drop foot on her R ankle. Pt  has depended on her L LE since the stroke due to her weakness and impaired funciton on the right LE. Since the fall she has had throbbing pain in the left LE which is exacerbated with increased weightbearing activity. Pt reports she has had x rays and it shows nothing is broken but despite this she has had continued pain in the left. Pt reports pain is on the lateral aspect of her left LE up into her posterior thigh. Pt reports doctor thinks this could be from nerve related pain but will need PT in order for her insurance to cover MRI of the lumbar spine. Prior to this fall she fell and broke her right wrist.  Pt reports she can perform any activities above the level of the wast but she is  unable to bend over to complete tasks. Pt reports she has multiple wakers, 3 wheel, 4 wheel, wheelchair, standard rolling walker. Pt had brain MRI following fall and no reports of bleeding or issues.   ? Limitations Walking   ? How long can you sit comfortably? a couple hours due to low back pain   ? How long can you stand comfortably? 15 minutes   ? How long can you walk comfortably? Able to walk around [place] but was very fatigued following   ? Currently in Pain? No/denies   ? Pain Onset In the past 7 days   ? ?  ?  ? ?  ? ? ? ?TREATMENT: ? ? ?Manual therapy ?  ?PT performed passive SLR hamstring stretch to L and R  LE  30 sec hold x4 sets ?Progressed to SLR hamstring stretch with ankle DF/PF neural flossing 2 sets x10 reps LLE only; ?PT performed passive single knee to chest stretch 20 sec hold x2 sets Left LE and right lower extremity ?PT performed passive piriformis stretch on right lower extremity and LLE  30 sec hold x 2 sets ? ?  ?Instructed patient in right sidelying ?Intensive STM to left piriformis x 10 min utilizing rolling stick; Patient exhibits tightness in piriformis as well as gluteus medius and tensor fascia latae which was alleviated by IASTM  ?Following manual therapy patient exhibits better tissue extensibility with less tightness and less trigger points ?Transitioned to L SL, addressed trigger points in R Posterior hip region with rolling stick/ IASTM and pt reports improved pain following.  ?Patient has significantly improved areas of trigger point this session but trigger point still present and created some discomfort.  No increase in pain reported at end of session. ? ?Therex:  ? ? ?Bridge x 10  ?Attempted bridge marching but unable to coordinate movements ?SL bridges 10 x ea LE  ? ?2 x 10 BTB supine clamshells cues for hold times and cues for R LE muscle activation  ? ? ?Pt educated throughout session about proper posture and technique with exercises. Improved exercise technique, movement  at target joints, use of target muscles after min to mod verbal, visual, tactile cues. ? ? ? ? ? ? ? ? ? ? ? ? ? ? ? ? ? ? ? ? ? ? ? ? ? ? ? ? ? PT Education - 01/17/22 0949   ? ? Education Details exercise technique, body mechanics   ? Person(s) Educated Patient   ? Methods Explanation   ? Comprehension Verbalized understanding   ? ?  ?  ? ?  ? ? ? PT Short Term Goals - 11/20/21 0820   ? ?  ? PT  SHORT TERM GOAL #1  ? Title Patient will be independent in home exercise program to improve strength/mobility for better functional independence with ADLs.   ? Baseline Met   ? Time 3   ? Period Days   ? Status Achieved   ?  ? PT SHORT TERM GOAL #2  ? Title Pt will be able to complete 6 MWT without requiring rest break or sitting in order to indicate ipmroved aerobic and muscular endurance for everyday tasks.   ? Baseline 835 feet with 3 wheel walker at PN 9/8   ? Time 4   ? Period Weeks   ? Status Achieved   ?  ? PT SHORT TERM GOAL #3  ? Title Patient will complete five times sit to stand test in < 25 seconds indicating an increased LE strength and improved balance.   ? Baseline 16 sec 9/8 with UE use   ? Time 4   ? Period Weeks   ? Status Achieved   ? Target Date 06/18/21   ? ?  ?  ? ?  ? ? ? ? PT Long Term Goals - 01/08/22 1005   ? ?  ? PT LONG TERM GOAL #1  ? Title Pt will improve FOTO score to 60 in order to indicate improvement with everyday tasks   ? Baseline FOTO: 58 om 9/8 (met initial foto goal), 1025: 58% 12/8: 57%, 12/20: 60% 2/7: 56% (previously achieve) 55 on 12/18/21   ? Time 12   ? Period Weeks   ? Status Partially Met   ? Target Date 01/29/22   ?  ? PT LONG TERM GOAL #2  ? Title Patient will complete five times sit to stand test in < 13 seconds indicating an increased LE strength and improved balance.   ? Baseline 5XSTS in 16 sec with UE use 06/21/21, 10/25: 18 sec with UE use, 15.1 sec on 12/8, 12/19: 14 sec with 1 UE assist; 2/7: 11.75 sec with 1 UE   ? Time 8   ? Period Weeks   ? Status Achieved   ? Target  Date 01/29/22   ?  ? PT LONG TERM GOAL #3  ? Title Patient will increase six minute walk test distance to >900 feet for progression to improve gait ability and safety with ambulation   ? Baseline 10/25: 6

## 2022-01-21 DIAGNOSIS — D689 Coagulation defect, unspecified: Secondary | ICD-10-CM | POA: Diagnosis not present

## 2022-01-21 DIAGNOSIS — Z7901 Long term (current) use of anticoagulants: Secondary | ICD-10-CM | POA: Diagnosis not present

## 2022-01-22 ENCOUNTER — Encounter: Payer: Self-pay | Admitting: Physical Therapy

## 2022-01-22 ENCOUNTER — Ambulatory Visit: Payer: Medicare Other | Admitting: Physical Therapy

## 2022-01-22 DIAGNOSIS — R262 Difficulty in walking, not elsewhere classified: Secondary | ICD-10-CM | POA: Diagnosis not present

## 2022-01-22 DIAGNOSIS — R269 Unspecified abnormalities of gait and mobility: Secondary | ICD-10-CM

## 2022-01-22 DIAGNOSIS — M79605 Pain in left leg: Secondary | ICD-10-CM

## 2022-01-22 DIAGNOSIS — R2689 Other abnormalities of gait and mobility: Secondary | ICD-10-CM | POA: Diagnosis not present

## 2022-01-22 NOTE — Therapy (Signed)
[address] [facility] ?[place], [place], [address] ?Phone: [phone]   Fax:  [phone] ? ?Physical Therapy Treatment ? ?Patient Details  ?Name: [name] ?MRN: [id number] ?Date of Birth: 05-20-1946 ?Referring Provider (PT): Merlene Pulling PA-C ? ? ?Encounter Date: 01/22/2022 ? ? PT End of Session - 01/22/22 1034   ? ? Visit Number 70   ? Number of Visits 68   ? Date for PT Re-Evaluation 01/29/22   ? Authorization Type Medicare   ? Authorization Time Period recert 89/2/11-06/17/16 Recert 4/0/81 - 4/48/18   ? Progress Note Due on Visit 60   ? PT Start Time 5631   ? PT Stop Time 1015   ? PT Time Calculation (min) 40 min   ? Equipment Utilized During Treatment Gait belt   ? Activity Tolerance Patient tolerated treatment well;No increased pain   ? Behavior During Therapy [facility] for tasks assessed/performed   ? ?  ?  ? ?  ? ? ?Past Medical History:  ?Diagnosis Date  ? Allergic rhinitis   ? Clotting disorder ([place])   ? Factor 5  ? Diabetes mellitus without complication ([place])   ? no meds-diet controlled  ? Factor 5 Leiden mutation, heterozygous ([place])   ? Family history of adverse reaction to anesthesia   ? Daughter was "paralyzed" for several days after 1 surgery.  ? Heart murmur   ? mild mitral regurg-echo 2010  ? Hyperlipidemia   ? Hypertension   ? Lupus anticoagulant positive   ? Obesity   ? Pre-diabetes   ? Primary localized osteoarthritis of left hip 03/30/2019  ? Stroke [facility]) 2010  ? Some right sided weakness.  Gait issues.  ? Wears dentures   ? partial upper  ? ? ?Past Surgical History:  ?Procedure Laterality Date  ? ABDOMINAL HYSTERECTOMY  1981  ? APPENDECTOMY    ? AXILLARY LYMPH NODE BIOPSY Left 05/16/2014  ? Procedure: EXCISION 2CM LEFT AXILLARY MASS;  Surgeon: [name], MD;  Location: [facility];  Service: General;  Laterality: Left;  Left axillary sebaceous cyst excision  ? CATARACT EXTRACTION W/PHACO Left 04/11/2020  ? Procedure:  CATARACT EXTRACTION PHACO AND INTRAOCULAR LENS PLACEMENT (IOC) LEFT DIABETIC TORIC LENS  6.05 00:34.9;  Surgeon: [name], MD;  Location: [facility];  Service: Ophthalmology;  Laterality: Left;  ? CATARACT EXTRACTION W/PHACO Right 12/12/2020  ? Procedure: CATARACT EXTRACTION PHACO AND INTRAOCULAR LENS PLACEMENT ([place]) RIGHT TORIC LENS;  Surgeon: [name], MD;  Location: [place];  Service: Ophthalmology;  Laterality: Right;  6.55 ?0:41.1  ? DILATION AND CURETTAGE OF UTERUS    ? SKIN CANCER EXCISION  2016  ? anlke  ? 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  ? Procedure: TOTAL HIP ARTHROPLASTY;  Surgeon: [name], MD;  [facility];  Service: Orthopedics;  Laterality: Left;  ? ? ?There were no vitals filed for this visit. ? ? Subjective Assessment - 01/22/22 0942   ? ? Subjective Pt reports some stiffness in her hips and some pain in her right lower leg from wearing bioness too much as it owre her skin raw. Wound evaluated by PT and wound bed looks clean with some reddness but no s/s of infection. Pt reports treating with prescription antibiotic and trying to keep the Bioness off unless she absolutely needs it.   ? Pertinent History Pt reports she had a stroke in 2010 and was in inpatient rehab for 4 weeks. Pt reports  she has been to therapy about one time a year since the onset of her stroke for various ailments including L THA in 2020. Pt reports she has bioness L300 which improved her drop foot on her R ankle. Pt has depended on her L LE since the stroke due to her weakness and impaired funciton on the right LE. Since the fall she has had throbbing pain in the left LE which is exacerbated with increased weightbearing activity. Pt reports she has had x rays and it shows nothing is broken but despite this she has had continued pain in the left. Pt reports pain is on the lateral aspect of her left LE up into her posterior thigh. Pt reports doctor thinks this could be from  nerve related pain but will need PT in order for her insurance to cover MRI of the lumbar spine. Prior to this fall she fell and broke her right wrist.  Pt reports she can perform any activities above the level of the wast but she is unable to bend over to complete tasks. Pt reports she has multiple wakers, 3 wheel, 4 wheel, wheelchair, standard rolling walker. Pt had brain MRI following fall and no reports of bleeding or issues.   ? Limitations Walking   ? How long can you sit comfortably? a couple hours due to low back pain   ? How long can you stand comfortably? 15 minutes   ? How long can you walk comfortably? Able to walk around [place] but was very fatigued following   ? Pain Onset In the past 7 days   ? ?  ?  ? ?  ? ? ? ? ?TREATMENT: ? ? ?Manual therapy ?  ?PT performed passive SLR hamstring stretch to L and R  LE  30 sec hold x4 sets ?Progressed to SLR hamstring stretch with ankle DF/PF neural flossing 2 sets x10 reps LLE only; ?PT performed passive single knee to chest stretch 20 sec hold x2 sets Left LE and right lower extremity ?PT performed passive piriformis stretch on right lower extremity and LLE  30 sec hold x 2 sets ? ?  ?Instructed patient in left sidelying ?addressed trigger points in R Posterior hip region with rolling stick/ IASTM and pt reports improved pain following.  ?Patient has significantly improved areas of trigger point this session but trigger point still present and created some discomfort.  No increase in pain reported at end of session. ? ?Therex:  ? ? ?Bridge 2 x 10 with abduction (BTB)  ? ?1*10 SL on the right lower extremity clamshells, patient had some difficulty with contralateral cramping in this position. ?-Difficulty with full active range of motion. ? ? ?Pt educated throughout session about proper posture and technique with exercises. Improved exercise technique, movement at target joints, use of target muscles after min to mod verbal, visual, tactile  cues. ? ? ? ? ? ? ? ? ? ? ? ? ? ? ? ? ? ? ? ? ? ? ? ? ? ? ? ? ? ? ? ? PT Short Term Goals - 11/20/21 0820   ? ?  ? PT SHORT TERM GOAL #1  ? Title Patient will be independent in home exercise program to improve strength/mobility for better functional independence with ADLs.   ? Baseline Met   ? Time 3   ? Period Days   ? Status Achieved   ?  ? PT SHORT TERM GOAL #2  ? Title Pt will be able to  complete 6 MWT without requiring rest break or sitting in order to indicate ipmroved aerobic and muscular endurance for everyday tasks.   ? Baseline 835 feet with 3 wheel walker at PN 9/8   ? Time 4   ? Period Weeks   ? Status Achieved   ?  ? PT SHORT TERM GOAL #3  ? Title Patient will complete five times sit to stand test in < 25 seconds indicating an increased LE strength and improved balance.   ? Baseline 16 sec 9/8 with UE use   ? Time 4   ? Period Weeks   ? Status Achieved   ? Target Date 06/18/21   ? ?  ?  ? ?  ? ? ? ? PT Long Term Goals - 01/08/22 1005   ? ?  ? PT LONG TERM GOAL #1  ? Title Pt will improve FOTO score to 60 in order to indicate improvement with everyday tasks   ? Baseline FOTO: 58 om 9/8 (met initial foto goal), 1025: 58% 12/8: 57%, 12/20: 60% 2/7: 56% (previously achieve) 55 on 12/18/21   ? Time 12   ? Period Weeks   ? Status Partially Met   ? Target Date 01/29/22   ?  ? PT LONG TERM GOAL #2  ? Title Patient will complete five times sit to stand test in < 13 seconds indicating an increased LE strength and improved balance.   ? Baseline 5XSTS in 16 sec with UE use 06/21/21, 10/25: 18 sec with UE use, 15.1 sec on 12/8, 12/19: 14 sec with 1 UE assist; 2/7: 11.75 sec with 1 UE   ? Time 8   ? Period Weeks   ? Status Achieved   ? Target Date 01/29/22   ?  ? PT LONG TERM GOAL #3  ? Title Patient will increase six minute walk test distance to >900 feet for progression to improve gait ability and safety with ambulation   ? Baseline 10/25: 650 feet with walker, 12/8: 825 with 3WW, 12/19: 715 feet with 3 wheeled walker;  2/7: 670 ft with 3 wheeled walker 01/08/2022= 735 feet   ? Time 8   ? Period Weeks   ? Status On-going   ? Target Date 01/29/22   ?  ? PT LONG TERM GOAL #4  ? Title Pt will safely demonstrate ability to navigate up and down 4 s

## 2022-01-24 ENCOUNTER — Ambulatory Visit: Payer: Medicare Other | Admitting: Physical Therapy

## 2022-01-24 DIAGNOSIS — M79605 Pain in left leg: Secondary | ICD-10-CM

## 2022-01-24 DIAGNOSIS — R262 Difficulty in walking, not elsewhere classified: Secondary | ICD-10-CM | POA: Diagnosis not present

## 2022-01-24 DIAGNOSIS — R269 Unspecified abnormalities of gait and mobility: Secondary | ICD-10-CM

## 2022-01-24 DIAGNOSIS — R2689 Other abnormalities of gait and mobility: Secondary | ICD-10-CM | POA: Diagnosis not present

## 2022-01-24 NOTE — Therapy (Signed)
[place] [facility] ?[place], [place], [address] ?Phone: [phone]   Fax:  [phone] ? ?Physical Therapy Treatment ? ?Patient Details  ?Name: [name] ?MRN: [id number] ?Date of Birth: [birth year] ?Referring Provider (PT): Merlene Pulling PA-C ? ? ?Encounter Date: 01/24/2022 ? ? PT End of Session - 01/24/22 1001   ? ? Visit Number 1   ? Number of Visits 68   ? Date for PT Re-Evaluation 01/29/22   ? Authorization Type Medicare   ? Authorization Time Period recert 23/5/57-12/14/18 Recert 11/18/40 - 04/18/22   ? Progress Note Due on Visit 60   ? PT Start Time 0930   ? PT Stop Time 1011   ? PT Time Calculation (min) 41 min   ? Equipment Utilized During Treatment Gait belt   ? Activity Tolerance Patient tolerated treatment well;No increased pain   ? Behavior During Therapy [facility] for tasks assessed/performed   ? ?  ?  ? ?  ? ? ?Past Medical History:  ?Diagnosis Date  ? Allergic rhinitis   ? Clotting disorder ([place])   ? Factor 5  ? Diabetes mellitus without complication ([place])   ? no meds-diet controlled  ? Factor 5 Leiden mutation, heterozygous ([place])   ? Family history of adverse reaction to anesthesia   ? Daughter was "paralyzed" for several days after 1 surgery.  ? Heart murmur   ? mild mitral regurg-echo 2010  ? Hyperlipidemia   ? Hypertension   ? Lupus anticoagulant positive   ? Obesity   ? Pre-diabetes   ? Primary localized osteoarthritis of left hip 03/30/2019  ? Stroke [facility]) 2010  ? Some right sided weakness.  Gait issues.  ? Wears dentures   ? partial upper  ? ? ?Past Surgical History:  ?Procedure Laterality Date  ? ABDOMINAL HYSTERECTOMY  1981  ? APPENDECTOMY    ? AXILLARY LYMPH NODE BIOPSY Left 05/16/2014  ? Procedure: EXCISION 2CM LEFT AXILLARY MASS;  Surgeon: [name], MD;  Location: [place];  Service: General;  Laterality: Left;  Left axillary sebaceous cyst excision  ? CATARACT EXTRACTION W/PHACO Left 04/11/2020  ? Procedure:  CATARACT EXTRACTION PHACO AND INTRAOCULAR LENS PLACEMENT (IOC) LEFT DIABETIC TORIC LENS  6.05 00:34.9;  Surgeon: [name], MD;  Location: [place];  Service: Ophthalmology;  Laterality: Left;  ? CATARACT EXTRACTION W/PHACO Right 12/12/2020  ? Procedure: CATARACT EXTRACTION PHACO AND INTRAOCULAR LENS PLACEMENT ([place]) RIGHT TORIC LENS;  Surgeon: [name], MD;  Location: [place];  Service: Ophthalmology;  Laterality: Right;  6.55 ?0:41.1  ? DILATION AND CURETTAGE OF UTERUS    ? SKIN CANCER EXCISION  2016  ? anlke  ? 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  ? Procedure: TOTAL HIP ARTHROPLASTY;  Surgeon: [name], MD;  [facility];  Service: Orthopedics;  Laterality: Left;  ? ? ?There were no vitals filed for this visit. ? ? Subjective Assessment - 01/24/22 1000   ? ? Subjective Pt reports continued pain in right hip but is "manageable" at this point.  Patient reports her sister's memorial services this weekend she is a little emotional regarding the loss of her sister at this time.  Patient reports no falls or loss of balance since last session.   ? Pertinent History Pt reports she had a stroke in 2010 and was in inpatient rehab for 4 weeks. Pt reports she has been to therapy about one time a year since the onset  of her stroke for various ailments including L THA in 2020. Pt reports she has bioness L300 which improved her drop foot on her R ankle. Pt has depended on her L LE since the stroke due to her weakness and impaired funciton on the right LE. Since the fall she has had throbbing pain in the left LE which is exacerbated with increased weightbearing activity. Pt reports she has had x rays and it shows nothing is broken but despite this she has had continued pain in the left. Pt reports pain is on the lateral aspect of her left LE up into her posterior thigh. Pt reports doctor thinks this could be from nerve related pain but will need PT in order for her insurance to  cover MRI of the lumbar spine. Prior to this fall she fell and broke her right wrist.  Pt reports she can perform any activities above the level of the wast but she is unable to bend over to complete tasks. Pt reports she has multiple wakers, 3 wheel, 4 wheel, wheelchair, standard rolling walker. Pt had brain MRI following fall and no reports of bleeding or issues.   ? Limitations Walking   ? How long can you sit comfortably? a couple hours due to low back pain   ? How long can you stand comfortably? 15 minutes   ? How long can you walk comfortably? Able to walk around [place] but was very fatigued following   ? Currently in Pain? Yes   ? Pain Score 5    ? Pain Location Hip   ? Pain Orientation Right   ? Pain Descriptors / Indicators Sore;Aching   ? Pain Type Acute pain   ? Pain Onset In the past 7 days   ? Pain Frequency Intermittent   ? ?  ?  ? ?  ? ? ? ? ?Manual therapy ?  ?PT performed passive SLR hamstring stretch to L and R  LE  30 sec hold x4 sets ?Progressed to SLR hamstring stretch with ankle DF/PF neural flossing 2 sets x10 reps LLE only; ?PT performed passive single knee to chest stretch 20 sec hold x2 sets Left LE and right lower extremity ?PT performed passive piriformis stretch on right lower extremity and LLE  30 sec hold x 2 sets ? ?  ?Instructed patient in left sidelying ?addressed trigger points in R Posterior hip region with rolling stick/ IASTM and pt reports improved pain following.  ?Patient has significantly improved areas of trigger point this session but trigger point still present and created some discomfort.  No increase in pain reported at end of session. ? ?Therex:  ? ? ?2*10 SL on the right lower extremity  with RLE hip abduction  ?-Difficulty with full active range of motion. ? ?Hook lying ? ?Bridge 2 x 10 with abduction band (GTB)  ? ?Adduction ball squeeze 2 x 10 x 5 sec hold  ? ?Pt educated throughout session about proper posture and technique with exercises. Improved exercise  technique, movement at target joints, use of target muscles after min to mod verbal, visual, tactile cues. ? ? ? ? ? ? ? ? ? ? ? ? ? ? ? ? ? ? ? ? ? ? ? ? ? ? ? PT Education - 01/24/22 1026   ? ? Education Details Exercise technique   ? Person(s) Educated Patient   ? Methods Explanation   ? Comprehension Verbalized understanding;Returned demonstration   ? ?  ?  ? ?  ? ? ?  PT Short Term Goals - 11/20/21 0820   ? ?  ? PT SHORT TERM GOAL #1  ? Title Patient will be independent in home exercise program to improve strength/mobility for better functional independence with ADLs.   ? Baseline Met   ? Time 3   ? Period Days   ? Status Achieved   ?  ? PT SHORT TERM GOAL #2  ? Title Pt will be able to complete 6 MWT without requiring rest break or sitting in order to indicate ipmroved aerobic and muscular endurance for everyday tasks.   ? Baseline 835 feet with 3 wheel walker at PN 9/8   ? Time 4   ? Period Weeks   ? Status Achieved   ?  ? PT SHORT TERM GOAL #3  ? Title Patient will complete five times sit to stand test in < 25 seconds indicating an increased LE strength and improved balance.   ? Baseline 16 sec 9/8 with UE use   ? Time 4   ? Period Weeks   ? Status Achieved   ? Target Date 06/18/21   ? ?  ?  ? ?  ? ? ? ? PT Long Term Goals - 01/08/22 1005   ? ?  ? PT LONG TERM GOAL #1  ? Title Pt will improve FOTO score to 60 in order to indicate improvement with everyday tasks   ? Baseline FOTO: 58 om 9/8 (met initial foto goal), 1025: 58% 12/8: 57%, 12/20: 60% 2/7: 56% (previously achieve) 55 on 12/18/21   ? Time 12   ? Period Weeks   ? Status Partially Met   ? Target Date 01/29/22   ?  ? PT LONG TERM GOAL #2  ? Title Patient will complete five times sit to stand test in < 13 seconds indicating an increased LE strength and improved balance.   ? Baseline 5XSTS in 16 sec with UE use 06/21/21, 10/25: 18 sec with UE use, 15.1 sec on 12/8, 12/19: 14 sec with 1 UE assist; 2/7: 11.75 sec with 1 UE   ? Time 8   ? Period Weeks   ?  Status Achieved   ? Target Date 01/29/22   ?  ? PT LONG TERM GOAL #3  ? Title Patient will increase six minute walk test distance to >900 feet for progression to improve gait ability and safety with ambulation   ?

## 2022-01-28 DIAGNOSIS — Z20822 Contact with and (suspected) exposure to covid-19: Secondary | ICD-10-CM | POA: Diagnosis not present

## 2022-01-29 ENCOUNTER — Ambulatory Visit: Payer: Medicare Other | Admitting: Physical Therapy

## 2022-01-29 DIAGNOSIS — R269 Unspecified abnormalities of gait and mobility: Secondary | ICD-10-CM | POA: Diagnosis not present

## 2022-01-29 DIAGNOSIS — R262 Difficulty in walking, not elsewhere classified: Secondary | ICD-10-CM | POA: Diagnosis not present

## 2022-01-29 DIAGNOSIS — M79605 Pain in left leg: Secondary | ICD-10-CM

## 2022-01-29 DIAGNOSIS — R2689 Other abnormalities of gait and mobility: Secondary | ICD-10-CM | POA: Diagnosis not present

## 2022-01-29 NOTE — Therapy (Signed)
[place] [facility] ?[place], [place], [address] ?Phone: [phone]   Fax:  [phone] ? ?Physical Therapy Treatment/ PT discharge Therapy  ? ?Patient Details  ?Name: [name] ?MRN: [id number] ?Date of Birth: 25-Mar-1946 ?Referring Provider (PT): Merlene Pulling PA-C ? ? ?Encounter Date: 01/29/2022 ? ? PT End of Session - 01/29/22 3662   ? ? Visit Number 77   ? Number of Visits 68   ? Date for PT Re-Evaluation 01/29/22   ? Authorization Type Medicare   ? Authorization Time Period recert 94/7/65-01/17/49 Recert 12/16/44 - 5/68/12   ? Progress Note Due on Visit 60   ? PT Start Time 7517   ? PT Stop Time 1014   ? PT Time Calculation (min) 39 min   ? Equipment Utilized During Treatment Gait belt   ? Activity Tolerance Patient tolerated treatment well;No increased pain   ? Behavior During Therapy [facility] for tasks assessed/performed   ? ?  ?  ? ?  ? ? ?Past Medical History:  ?Diagnosis Date  ? Allergic rhinitis   ? Clotting disorder ([place])   ? Factor 5  ? Diabetes mellitus without complication ([place])   ? no meds-diet controlled  ? Factor 5 Leiden mutation, heterozygous ([place])   ? Family history of adverse reaction to anesthesia   ? Daughter was "paralyzed" for several days after 1 surgery.  ? Heart murmur   ? mild mitral regurg-echo 2010  ? Hyperlipidemia   ? Hypertension   ? Lupus anticoagulant positive   ? Obesity   ? Pre-diabetes   ? Primary localized osteoarthritis of left hip 03/30/2019  ? Stroke [facility] 2010  ? Some right sided weakness.  Gait issues.  ? Wears dentures   ? partial upper  ? ? ?Past Surgical History:  ?Procedure Laterality Date  ? ABDOMINAL HYSTERECTOMY  1981  ? APPENDECTOMY    ? AXILLARY LYMPH NODE BIOPSY Left 05/16/2014  ? Procedure: EXCISION 2CM LEFT AXILLARY MASS;  Surgeon: [name], MD;  Location: [place];  Service: General;  Laterality: Left;  Left axillary sebaceous cyst excision  ? CATARACT EXTRACTION W/PHACO Left  04/11/2020  ? Procedure: CATARACT EXTRACTION PHACO AND INTRAOCULAR LENS PLACEMENT (IOC) LEFT DIABETIC TORIC LENS  6.05 00:34.9;  Surgeon: [name], MD;  Location: [place];  Service: Ophthalmology;  Laterality: Left;  ? CATARACT EXTRACTION W/PHACO Right 12/12/2020  ? Procedure: CATARACT EXTRACTION PHACO AND INTRAOCULAR LENS PLACEMENT ([place]) RIGHT TORIC LENS;  Surgeon: [name], MD;  Location: [place];  Service: Ophthalmology;  Laterality: Right;  6.55 ?0:41.1  ? DILATION AND CURETTAGE OF UTERUS    ? SKIN CANCER EXCISION  2016  ? anlke  ? 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  ? Procedure: TOTAL HIP ARTHROPLASTY;  Surgeon: [name], MD;  [facility];  Service: Orthopedics;  Laterality: Left;  ? ? ?There were no vitals filed for this visit. ? ? Subjective Assessment - 01/29/22 1035   ? ? Subjective Patient reports her visit with [employer] support representative was very helpful.  Patient reports she feels like she is stepping more on her heel and less on the side of her right lower extremity when stepping.  Patient reports some but "manageable "pain in her right hip and no pain in her left hip at this time.  Patient does report she did not use her normal medications for pain relief this morning and that may be why she is  experiencing minimal pain.  Patient reports she is comfortable with all her home exercises and is very happy with her physical therapy progress at this time.   ? Pertinent History Pt reports she had a stroke in 2010 and was in inpatient rehab for 4 weeks. Pt reports she has been to therapy about one time a year since the onset of her stroke for various ailments including L THA in 2020. Pt reports she has bioness L300 which improved her drop foot on her R ankle. Pt has depended on her L LE since the stroke due to her weakness and impaired funciton on the right LE. Since the fall she has had throbbing pain in the left LE which is exacerbated with increased  weightbearing activity. Pt reports she has had x rays and it shows nothing is broken but despite this she has had continued pain in the left. Pt reports pain is on the lateral aspect of her left LE up into her posterior thigh. Pt reports doctor thinks this could be from nerve related pain but will need PT in order for her insurance to cover MRI of the lumbar spine. Prior to this fall she fell and broke her right wrist.  Pt reports she can perform any activities above the level of the wast but she is unable to bend over to complete tasks. Pt reports she has multiple wakers, 3 wheel, 4 wheel, wheelchair, standard rolling walker. Pt had brain MRI following fall and no reports of bleeding or issues.   ? Limitations Walking   ? How long can you sit comfortably? a couple hours due to low back pain   ? How long can you stand comfortably? 15 minutes   ? How long can you walk comfortably? Able to walk around [place] but was very fatigued following   ? Pain Onset In the past 7 days   ? ?  ?  ? ?  ? ? ? ? ? ?Manual therapy ?  ?PT performed passive SLR hamstring stretch to R  LE  30 sec hold x4 sets ?PT performed passive single knee to chest stretch 30 sec hold x3 sets Left LE and right lower extremity ?PT performed passive piriformis stretch on right lower extremity and LLE  30 sec hold x 2 sets ? ?  ?Instructed patient in left sidelying ?addressed trigger points in R Posterior hip region with rolling stick/ IASTM and pt reports improved pain following.  ?Patient has significantly improved areas of trigger point this session but trigger point still present and created some discomfort.  No increase in pain reported at end of session. ?Performed R LE glute med and glute min IASTM and STM to trigger points, pt experienced relief of discomfort following x 10 min  ? ? ?Therex ?6MWT with 3WW and with SBA from PT, ambulated 690 feet total, min fatigue noted and pt able to hold conversation throughout.  ?- Pt educated in safe walking  habits outside therapy ( walking on even surfaces with friends for exercise with careful notice to heat as summer high temperatures near.  ? ?Pt educated throughout session about proper posture and technique with exercises. Improved exercise technique, movement at target joints, use of target muscles after min to mod verbal, visual, tactile cues. ?Note: Portions of this document were prepared using Dragon voice recognition software and although reviewed may contain unintentional dictation errors in syntax, grammar, or spelling. ? ? ? ? ? ? ? ? ? ? ? ? ? ? ? ? ? ? ? ? ? ?  PT Education - 01/29/22 1036   ? ? Education Details Home exercise techniques for adherence and other discharge instructions.   ? Person(s) Educated Patient   ? Methods Explanation   ? Comprehension Verbalized understanding   ? ?  ?  ? ?  ? ? ? PT Short Term Goals - 11/20/21 0820   ? ?  ? PT SHORT TERM GOAL #1  ? Title Patient will be independent in home exercise program to improve strength/mobility for better functional independence with ADLs.   ? Baseline Met   ? Time 3   ? Period Days   ? Status Achieved   ?  ? PT SHORT TERM GOAL #2  ? Title Pt will be able to complete 6 MWT without requiring rest break or sitting in order to indicate ipmroved aerobic and muscular endurance for everyday tasks.   ? Baseline 835 feet with 3 wheel walker at PN 9/8   ? Time 4   ? Period Weeks   ? Status Achieved   ?  ? PT SHORT TERM GOAL #3  ? Title Patient will complete five times sit to stand test in < 25 seconds indicating an increased LE strength and improved balance.   ? Baseline 16 sec 9/8 with UE use   ? Time 4   ? Period Weeks   ? Status Achieved   ? Target Date 06/18/21   ? ?  ?  ? ?  ? ? ? ? PT Long Term Goals - 01/29/22 1039   ? ?  ? PT LONG TERM GOAL #1  ? Title Pt will improve FOTO score to 60 in order to indicate improvement with everyday tasks   ? Baseline FOTO: 58 om 9/8 (met initial foto goal), 1025: 58% 12/8: 57%, 12/20: 60% 2/7: 56% (previously  achieve) 55 on 12/18/21 65 on 4/18   ? Time 12   ? Period Weeks   ? Status Achieved   ? Target Date 01/29/22   ?  ? PT LONG TERM GOAL #2  ? Title Patient will complete five times sit to stand test in < 13 seco

## 2022-01-31 ENCOUNTER — Ambulatory Visit: Payer: Medicare Other | Admitting: Physical Therapy

## 2022-02-01 DIAGNOSIS — R051 Acute cough: Secondary | ICD-10-CM | POA: Diagnosis not present

## 2022-02-01 DIAGNOSIS — Z20822 Contact with and (suspected) exposure to covid-19: Secondary | ICD-10-CM | POA: Diagnosis not present

## 2022-02-01 DIAGNOSIS — R059 Cough, unspecified: Secondary | ICD-10-CM | POA: Diagnosis not present

## 2022-02-05 ENCOUNTER — Ambulatory Visit: Payer: Medicare Other | Admitting: Physical Therapy

## 2022-02-07 ENCOUNTER — Ambulatory Visit: Payer: Medicare Other | Admitting: Physical Therapy

## 2022-02-11 DIAGNOSIS — Z7901 Long term (current) use of anticoagulants: Secondary | ICD-10-CM | POA: Diagnosis not present

## 2022-02-11 DIAGNOSIS — R2689 Other abnormalities of gait and mobility: Secondary | ICD-10-CM | POA: Diagnosis not present

## 2022-02-11 DIAGNOSIS — F329 Major depressive disorder, single episode, unspecified: Secondary | ICD-10-CM | POA: Diagnosis not present

## 2022-02-11 DIAGNOSIS — I69959 Hemiplegia and hemiparesis following unspecified cerebrovascular disease affecting unspecified side: Secondary | ICD-10-CM | POA: Diagnosis not present

## 2022-02-11 DIAGNOSIS — D689 Coagulation defect, unspecified: Secondary | ICD-10-CM | POA: Diagnosis not present

## 2022-02-11 DIAGNOSIS — R7301 Impaired fasting glucose: Secondary | ICD-10-CM | POA: Diagnosis not present

## 2022-02-11 DIAGNOSIS — E785 Hyperlipidemia, unspecified: Secondary | ICD-10-CM | POA: Diagnosis not present

## 2022-02-11 DIAGNOSIS — M8580 Other specified disorders of bone density and structure, unspecified site: Secondary | ICD-10-CM | POA: Diagnosis not present

## 2022-02-11 DIAGNOSIS — I10 Essential (primary) hypertension: Secondary | ICD-10-CM | POA: Diagnosis not present

## 2022-02-11 DIAGNOSIS — M810 Age-related osteoporosis without current pathological fracture: Secondary | ICD-10-CM | POA: Diagnosis not present

## 2022-02-12 ENCOUNTER — Ambulatory Visit: Payer: Medicare Other | Admitting: Physical Therapy

## 2022-02-13 DIAGNOSIS — Z20822 Contact with and (suspected) exposure to covid-19: Secondary | ICD-10-CM | POA: Diagnosis not present

## 2022-02-14 ENCOUNTER — Ambulatory Visit: Payer: Medicare Other | Admitting: Physical Therapy

## 2022-02-15 ENCOUNTER — Other Ambulatory Visit: Payer: Self-pay

## 2022-02-15 DIAGNOSIS — M810 Age-related osteoporosis without current pathological fracture: Secondary | ICD-10-CM | POA: Insufficient documentation

## 2022-02-16 DIAGNOSIS — Z20822 Contact with and (suspected) exposure to covid-19: Secondary | ICD-10-CM | POA: Diagnosis not present

## 2022-02-18 DIAGNOSIS — Z20822 Contact with and (suspected) exposure to covid-19: Secondary | ICD-10-CM | POA: Diagnosis not present

## 2022-02-19 ENCOUNTER — Telehealth: Payer: Self-pay | Admitting: Pharmacy Technician

## 2022-02-19 ENCOUNTER — Ambulatory Visit: Payer: Medicare Other | Admitting: Physical Therapy

## 2022-02-19 DIAGNOSIS — Z85828 Personal history of other malignant neoplasm of skin: Secondary | ICD-10-CM | POA: Diagnosis not present

## 2022-02-19 DIAGNOSIS — D1801 Hemangioma of skin and subcutaneous tissue: Secondary | ICD-10-CM | POA: Diagnosis not present

## 2022-02-19 DIAGNOSIS — Z7901 Long term (current) use of anticoagulants: Secondary | ICD-10-CM | POA: Diagnosis not present

## 2022-02-19 DIAGNOSIS — L814 Other melanin hyperpigmentation: Secondary | ICD-10-CM | POA: Diagnosis not present

## 2022-02-19 DIAGNOSIS — D225 Melanocytic nevi of trunk: Secondary | ICD-10-CM | POA: Diagnosis not present

## 2022-02-19 DIAGNOSIS — L821 Other seborrheic keratosis: Secondary | ICD-10-CM | POA: Diagnosis not present

## 2022-02-19 DIAGNOSIS — L918 Other hypertrophic disorders of the skin: Secondary | ICD-10-CM | POA: Diagnosis not present

## 2022-02-19 DIAGNOSIS — L570 Actinic keratosis: Secondary | ICD-10-CM | POA: Diagnosis not present

## 2022-02-19 NOTE — Telephone Encounter (Addendum)
BIV Completed Patient has medicare a/b and [place] will be needed for [employer].  Auth Submission: referral closed due to several attempts to receive clinicals from MD office

## 2022-02-21 ENCOUNTER — Ambulatory Visit: Payer: Medicare Other | Admitting: Physical Therapy

## 2022-02-26 ENCOUNTER — Ambulatory Visit: Payer: Medicare Other | Admitting: Physical Therapy

## 2022-02-28 ENCOUNTER — Ambulatory Visit: Payer: Medicare Other | Admitting: Physical Therapy

## 2022-03-05 ENCOUNTER — Ambulatory Visit: Payer: Medicare Other | Admitting: Physical Therapy

## 2022-03-07 ENCOUNTER — Ambulatory Visit: Payer: Medicare Other | Admitting: Physical Therapy

## 2022-03-12 ENCOUNTER — Ambulatory Visit: Payer: Medicare Other | Admitting: Physical Therapy

## 2022-03-14 ENCOUNTER — Ambulatory Visit: Payer: Medicare Other | Admitting: Physical Therapy

## 2022-03-19 ENCOUNTER — Ambulatory Visit: Payer: Medicare Other | Admitting: Physical Therapy

## 2022-03-21 ENCOUNTER — Ambulatory Visit: Payer: Medicare Other | Admitting: Physical Therapy

## 2022-03-26 ENCOUNTER — Ambulatory Visit: Payer: Medicare Other | Admitting: Physical Therapy

## 2022-03-28 ENCOUNTER — Ambulatory Visit: Payer: Medicare Other | Admitting: Physical Therapy

## 2022-04-02 ENCOUNTER — Ambulatory Visit: Payer: Medicare Other | Admitting: Physical Therapy

## 2022-04-11 ENCOUNTER — Ambulatory Visit: Payer: Medicare Other | Admitting: Physical Therapy

## 2022-04-18 ENCOUNTER — Ambulatory Visit
Admission: RE | Admit: 2022-04-18 | Discharge: 2022-04-18 | Disposition: A | Discharge status: Home / Self Care | Payer: Medicare Other | Source: Ambulatory Visit | Attending: Internal Medicine | Admitting: Internal Medicine

## 2022-04-18 ENCOUNTER — Ambulatory Visit: Payer: Medicare Other | Admitting: Physical Therapy

## 2022-04-18 DIAGNOSIS — M810 Age-related osteoporosis without current pathological fracture: Secondary | ICD-10-CM | POA: Diagnosis not present

## 2022-04-18 MED ORDER — DENOSUMAB 60 MG/ML ~~LOC~~ SOSY
60.0000 mg | PREFILLED_SYRINGE | Freq: Once | SUBCUTANEOUS | Status: AC
Start: 2022-04-18 — End: 2022-04-18
  Administered 2022-04-18: 60 mg via SUBCUTANEOUS
  Filled 2022-04-18: qty 1

## 2022-04-19 DIAGNOSIS — Z7901 Long term (current) use of anticoagulants: Secondary | ICD-10-CM | POA: Diagnosis not present

## 2022-04-30 ENCOUNTER — Ambulatory Visit: Payer: Medicare Other | Admitting: Physical Therapy

## 2022-05-07 ENCOUNTER — Ambulatory Visit: Payer: Medicare Other | Admitting: Physical Therapy

## 2022-05-09 ENCOUNTER — Ambulatory Visit: Payer: Medicare Other | Admitting: Physical Therapy

## 2022-05-14 ENCOUNTER — Ambulatory Visit: Payer: Medicare Other | Admitting: Physical Therapy

## 2022-05-16 ENCOUNTER — Ambulatory Visit: Payer: Medicare Other | Admitting: Physical Therapy

## 2022-05-21 ENCOUNTER — Ambulatory Visit: Payer: Medicare Other | Admitting: Physical Therapy

## 2022-05-23 ENCOUNTER — Ambulatory Visit: Payer: Medicare Other | Admitting: Physical Therapy

## 2022-05-28 ENCOUNTER — Ambulatory Visit: Payer: Medicare Other | Admitting: Physical Therapy

## 2022-05-30 ENCOUNTER — Ambulatory Visit: Payer: Medicare Other | Admitting: Physical Therapy

## 2022-06-04 ENCOUNTER — Ambulatory Visit: Payer: Medicare Other | Admitting: Physical Therapy

## 2022-06-05 DIAGNOSIS — Z961 Presence of intraocular lens: Secondary | ICD-10-CM | POA: Diagnosis not present

## 2022-06-06 ENCOUNTER — Ambulatory Visit: Payer: Medicare Other | Admitting: Physical Therapy

## 2022-07-05 ENCOUNTER — Encounter: Payer: Self-pay | Admitting: Pulmonary Disease

## 2022-07-08 DIAGNOSIS — Z7901 Long term (current) use of anticoagulants: Secondary | ICD-10-CM | POA: Diagnosis not present

## 2022-08-12 DIAGNOSIS — R7301 Impaired fasting glucose: Secondary | ICD-10-CM | POA: Diagnosis not present

## 2022-08-12 DIAGNOSIS — N390 Urinary tract infection, site not specified: Secondary | ICD-10-CM | POA: Diagnosis not present

## 2022-08-12 DIAGNOSIS — F329 Major depressive disorder, single episode, unspecified: Secondary | ICD-10-CM | POA: Diagnosis not present

## 2022-08-12 DIAGNOSIS — I10 Essential (primary) hypertension: Secondary | ICD-10-CM | POA: Diagnosis not present

## 2022-08-12 DIAGNOSIS — R82998 Other abnormal findings in urine: Secondary | ICD-10-CM | POA: Diagnosis not present

## 2022-08-12 DIAGNOSIS — R413 Other amnesia: Secondary | ICD-10-CM | POA: Diagnosis not present

## 2022-08-12 DIAGNOSIS — R2689 Other abnormalities of gait and mobility: Secondary | ICD-10-CM | POA: Diagnosis not present

## 2022-08-12 DIAGNOSIS — M8580 Other specified disorders of bone density and structure, unspecified site: Secondary | ICD-10-CM | POA: Diagnosis not present

## 2022-08-12 DIAGNOSIS — Z1331 Encounter for screening for depression: Secondary | ICD-10-CM | POA: Diagnosis not present

## 2022-08-12 DIAGNOSIS — E785 Hyperlipidemia, unspecified: Secondary | ICD-10-CM | POA: Diagnosis not present

## 2022-08-12 DIAGNOSIS — I69959 Hemiplegia and hemiparesis following unspecified cerebrovascular disease affecting unspecified side: Secondary | ICD-10-CM | POA: Diagnosis not present

## 2022-08-12 DIAGNOSIS — M810 Age-related osteoporosis without current pathological fracture: Secondary | ICD-10-CM | POA: Diagnosis not present

## 2022-08-12 DIAGNOSIS — D689 Coagulation defect, unspecified: Secondary | ICD-10-CM | POA: Diagnosis not present

## 2022-08-12 DIAGNOSIS — R7989 Other specified abnormal findings of blood chemistry: Secondary | ICD-10-CM | POA: Diagnosis not present

## 2022-08-12 DIAGNOSIS — Z1339 Encounter for screening examination for other mental health and behavioral disorders: Secondary | ICD-10-CM | POA: Diagnosis not present

## 2022-08-12 DIAGNOSIS — Z7901 Long term (current) use of anticoagulants: Secondary | ICD-10-CM | POA: Diagnosis not present

## 2022-08-12 DIAGNOSIS — R350 Frequency of micturition: Secondary | ICD-10-CM | POA: Diagnosis not present

## 2022-08-12 DIAGNOSIS — Z0000 Encounter for general adult medical examination without abnormal findings: Secondary | ICD-10-CM | POA: Diagnosis not present

## 2022-09-03 DIAGNOSIS — Z23 Encounter for immunization: Secondary | ICD-10-CM | POA: Diagnosis not present

## 2022-09-03 DIAGNOSIS — E785 Hyperlipidemia, unspecified: Secondary | ICD-10-CM | POA: Diagnosis not present

## 2022-09-03 DIAGNOSIS — D689 Coagulation defect, unspecified: Secondary | ICD-10-CM | POA: Diagnosis not present

## 2022-10-01 DIAGNOSIS — Z7901 Long term (current) use of anticoagulants: Secondary | ICD-10-CM | POA: Diagnosis not present

## 2022-10-21 ENCOUNTER — Ambulatory Visit
Admission: RE | Admit: 2022-10-21 | Discharge: 2022-10-21 | Disposition: A | Discharge status: Home / Self Care | Payer: Medicare Other | Source: Ambulatory Visit | Attending: Internal Medicine | Admitting: Internal Medicine

## 2022-10-21 DIAGNOSIS — M810 Age-related osteoporosis without current pathological fracture: Secondary | ICD-10-CM | POA: Insufficient documentation

## 2022-10-21 MED ORDER — DENOSUMAB 60 MG/ML ~~LOC~~ SOSY
60.0000 mg | PREFILLED_SYRINGE | Freq: Once | SUBCUTANEOUS | Status: AC
Start: 2022-10-21 — End: 2022-10-21
  Administered 2022-10-21: 60 mg via SUBCUTANEOUS
  Filled 2022-10-21: qty 1

## 2022-11-28 DIAGNOSIS — Z7901 Long term (current) use of anticoagulants: Secondary | ICD-10-CM | POA: Diagnosis not present

## 2023-01-06 DIAGNOSIS — Z7901 Long term (current) use of anticoagulants: Secondary | ICD-10-CM | POA: Diagnosis not present

## 2023-02-04 DIAGNOSIS — M8580 Other specified disorders of bone density and structure, unspecified site: Secondary | ICD-10-CM | POA: Diagnosis not present

## 2023-02-04 DIAGNOSIS — F329 Major depressive disorder, single episode, unspecified: Secondary | ICD-10-CM | POA: Diagnosis not present

## 2023-02-04 DIAGNOSIS — I10 Essential (primary) hypertension: Secondary | ICD-10-CM | POA: Diagnosis not present

## 2023-02-04 DIAGNOSIS — R413 Other amnesia: Secondary | ICD-10-CM | POA: Diagnosis not present

## 2023-02-04 DIAGNOSIS — E785 Hyperlipidemia, unspecified: Secondary | ICD-10-CM | POA: Diagnosis not present

## 2023-02-04 DIAGNOSIS — I69959 Hemiplegia and hemiparesis following unspecified cerebrovascular disease affecting unspecified side: Secondary | ICD-10-CM | POA: Diagnosis not present

## 2023-02-04 DIAGNOSIS — D689 Coagulation defect, unspecified: Secondary | ICD-10-CM | POA: Diagnosis not present

## 2023-02-04 DIAGNOSIS — R350 Frequency of micturition: Secondary | ICD-10-CM | POA: Diagnosis not present

## 2023-02-04 DIAGNOSIS — Z7901 Long term (current) use of anticoagulants: Secondary | ICD-10-CM | POA: Diagnosis not present

## 2023-02-04 DIAGNOSIS — R7301 Impaired fasting glucose: Secondary | ICD-10-CM | POA: Diagnosis not present

## 2023-02-04 DIAGNOSIS — R2689 Other abnormalities of gait and mobility: Secondary | ICD-10-CM | POA: Diagnosis not present

## 2023-03-14 DIAGNOSIS — N390 Urinary tract infection, site not specified: Secondary | ICD-10-CM | POA: Diagnosis not present

## 2023-03-14 DIAGNOSIS — Z7901 Long term (current) use of anticoagulants: Secondary | ICD-10-CM | POA: Diagnosis not present

## 2023-04-01 DIAGNOSIS — Z7901 Long term (current) use of anticoagulants: Secondary | ICD-10-CM | POA: Diagnosis not present

## 2023-04-21 ENCOUNTER — Ambulatory Visit
Admission: RE | Admit: 2023-04-21 | Discharge: 2023-04-21 | Disposition: A | Discharge status: Home / Self Care | Payer: Medicare Other | Source: Ambulatory Visit | Attending: Internal Medicine | Admitting: Internal Medicine

## 2023-04-21 DIAGNOSIS — M810 Age-related osteoporosis without current pathological fracture: Secondary | ICD-10-CM | POA: Diagnosis not present

## 2023-04-21 MED ORDER — DENOSUMAB 60 MG/ML ~~LOC~~ SOSY
60.0000 mg | PREFILLED_SYRINGE | Freq: Once | SUBCUTANEOUS | Status: AC
  Administered 2023-04-21: 60 mg via SUBCUTANEOUS
  Filled 2023-04-21: qty 1

## 2023-04-22 DIAGNOSIS — Z85828 Personal history of other malignant neoplasm of skin: Secondary | ICD-10-CM | POA: Diagnosis not present

## 2023-04-22 DIAGNOSIS — D2262 Melanocytic nevi of left upper limb, including shoulder: Secondary | ICD-10-CM | POA: Diagnosis not present

## 2023-04-22 DIAGNOSIS — L918 Other hypertrophic disorders of the skin: Secondary | ICD-10-CM | POA: Diagnosis not present

## 2023-04-22 DIAGNOSIS — D225 Melanocytic nevi of trunk: Secondary | ICD-10-CM | POA: Diagnosis not present

## 2023-04-22 DIAGNOSIS — L821 Other seborrheic keratosis: Secondary | ICD-10-CM | POA: Diagnosis not present

## 2023-04-22 DIAGNOSIS — D692 Other nonthrombocytopenic purpura: Secondary | ICD-10-CM | POA: Diagnosis not present

## 2023-04-22 DIAGNOSIS — D1801 Hemangioma of skin and subcutaneous tissue: Secondary | ICD-10-CM | POA: Diagnosis not present

## 2023-04-22 DIAGNOSIS — L814 Other melanin hyperpigmentation: Secondary | ICD-10-CM | POA: Diagnosis not present

## 2023-05-18 ENCOUNTER — Encounter: Payer: Medicare Other | Admitting: Nurse Practitioner

## 2023-05-18 NOTE — Progress Notes (Signed)
[name] logged on at [facility] for 5:15 visit. She left prior to appointment and did not return for visit.

## 2023-05-19 ENCOUNTER — Other Ambulatory Visit: Payer: Self-pay

## 2023-05-19 ENCOUNTER — Ambulatory Visit
Admission: RE | Admit: 2023-05-19 | Discharge: 2023-05-19 | Disposition: A | Discharge status: Home / Self Care | Payer: Medicare Other | Source: Ambulatory Visit | Attending: Physician Assistant | Admitting: Physician Assistant

## 2023-05-19 VITALS — BP 159/85 | HR 85 | Temp 98.0°F | Resp 18

## 2023-05-19 DIAGNOSIS — L089 Local infection of the skin and subcutaneous tissue, unspecified: Secondary | ICD-10-CM | POA: Diagnosis not present

## 2023-05-19 DIAGNOSIS — S80811A Abrasion, right lower leg, initial encounter: Secondary | ICD-10-CM | POA: Diagnosis not present

## 2023-05-19 MED ORDER — CEPHALEXIN 250 MG PO CAPS: 250.0000 mg | ORAL_CAPSULE | Freq: Four times a day (QID) | ORAL | 0 refills | Status: AC

## 2023-05-19 NOTE — ED Triage Notes (Signed)
Reports cats scratch right lower leg.  The cats stay inside the house, does not go outside.  Patient reports scratches may be recent to a month old.    Patient  has used antibiotic ointment

## 2023-05-19 NOTE — ED Provider Notes (Signed)
Renaldo Fiddler    CSN: [id number] Arrival date & time: 05/19/23  1555      History   Chief Complaint Chief Complaint  Patient presents with   scratches    HPI [name] is a 77 y.o. female.   Patient here today for evaluation of multiple abrasions to right lower leg.  She states that she sustained scratches from cats that are indoors only.  She states some of the scratches can may be a month old.  She has tried using antibiotic ointment without resolution.  She denies any fevers.  She has not any nausea or vomiting.  The history is provided by the patient.    Past Medical History:  Diagnosis Date   Allergic rhinitis    Clotting disorder (HCC)    Factor 5   Diabetes mellitus without complication (HCC)    no meds-diet controlled   Factor 5 Leiden mutation, heterozygous (HCC)    Family history of adverse reaction to anesthesia    Daughter was "paralyzed" for several days after 1 surgery.   Heart murmur    mild mitral regurg-echo 2010   Hyperlipidemia    Hypertension    Lupus anticoagulant positive    Obesity    Pre-diabetes    Primary localized osteoarthritis of left hip 03/30/2019   Stroke [facility]) 2010   Some right sided weakness.  Gait issues.   Wears dentures    partial upper    Patient Active Problem List   Diagnosis Date Noted   Osteoporosis 02/15/2022   Primary localized osteoarthritis of left hip 03/30/2019   Status post left hip replacement 03/30/2019    Past Surgical History:  Procedure Laterality Date   ABDOMINAL HYSTERECTOMY  1981   APPENDECTOMY     AXILLARY LYMPH NODE BIOPSY Left 05/16/2014   Procedure: EXCISION 2CM LEFT AXILLARY MASS;  Surgeon: Emelia Loron, MD;  Location: [facility];  Service: General;  Laterality: Left;  Left axillary sebaceous cyst excision   CATARACT EXTRACTION W/PHACO Left 04/11/2020   Procedure: CATARACT EXTRACTION PHACO AND INTRAOCULAR LENS PLACEMENT (IOC) LEFT DIABETIC TORIC LENS  6.05 00:34.9;   Surgeon: Galen Manila, MD;  Location: [facility];  Service: Ophthalmology;  Laterality: Left;   CATARACT EXTRACTION W/PHACO Right 12/12/2020   Procedure: CATARACT EXTRACTION PHACO AND INTRAOCULAR LENS PLACEMENT (IOC) RIGHT TORIC LENS;  Surgeon: Galen Manila, MD;  Location: [facility];  Service: Ophthalmology;  Laterality: Right;  6.55 0:41.1   DILATION AND CURETTAGE OF UTERUS     SKIN CANCER EXCISION  2016   anlke   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   Procedure: TOTAL HIP ARTHROPLASTY;  Surgeon: Teryl Lucy, MD;  Location: [facility];  Service: Orthopedics;  Laterality: Left;    OB History     Gravida  3   Para  2   Term  2   Preterm      AB  1   Living  2      SAB  1   IAB      Ectopic      Multiple      Live Births               Home Medications    Prior to Admission medications   Medication Sig Start Date End Date Taking? Authorizing Provider  cephALEXin (KEFLEX) 250 MG capsule Take 1 capsule (250 mg total) by mouth 4 (four) times daily for 7 days. 05/19/23 05/26/23 Yes Tomi Bamberger, PA-C  acetaminophen (TYLENOL) 650 MG CR tablet Take 1,300 mg by mouth every 8 (eight) hours.    [provider]  amLODipine (NORVASC) 5 MG tablet Take 5 mg by mouth 2 (two) times daily.     [provider]  B Complex Vitamins (B COMPLEX PO) Take 1 tablet by mouth daily.     [provider]  BIOTIN PO Take 1 tablet by mouth daily.     [provider]  cholecalciferol (VITAMIN D) 1000 UNITS tablet Take 1,000 Units by mouth daily.     [provider]  diclofenac sodium (VOLTAREN) 1 % GEL Apply 1 application topically 3 (three) times daily as needed (pain). Patient not taking: Reported on 12/12/2020    [provider]  escitalopram (LEXAPRO) 10 MG tablet Take 10 mg by mouth daily. 11/25/14   [provider]  ezetimibe (ZETIA) 10 MG tablet Take 5 mg by mouth 3 (three) times a week.     [provider]  fexofenadine (ALLEGRA) 60 MG tablet Take 60 mg by mouth 2 (two) times daily as needed for allergies or rhinitis.    [provider]  fluticasone (FLONASE) 50 MCG/ACT nasal spray Place 2 sprays into both nostrils daily as needed for allergies.  07/24/17   [provider]  lisinopril (PRINIVIL,ZESTRIL) 10 MG tablet Take 10 mg by mouth 2 (two) times daily.  08/22/17   [provider]  Magnesium 250 MG TABS Take 250 mg by mouth daily.     [provider]  simvastatin (ZOCOR) 20 MG tablet Take 20 mg by mouth at bedtime.     [provider]  vitamin B-12 (CYANOCOBALAMIN) 1000 MCG tablet Take 1,000 mcg by mouth daily.    [provider]  warfarin (COUMADIN) 5 MG tablet Take 5 mg by mouth daily. Take 5 mg by mouth daily    [provider]    Family History Family History  Problem Relation Age of Onset   Heart disease Mother    COPD Father    Colon cancer Sister    Clotting disorder Sister    Diabetes Sister    Kidney disease Sister    Heart disease Brother     Social History Social History   Tobacco Use   Smoking status: Former    Current packs/day: 0.00    Types: Cigarettes    Quit date: 03/14/2009    Years since quitting: 14.1   Smokeless tobacco: Never  Vaping Use   Vaping status: Never Used  Substance Use Topics   Alcohol use: No    Alcohol/week: 0.0 standard drinks of alcohol   Drug use: No     Allergies   Codeine   Review of Systems Review of Systems  Constitutional:  Negative for chills and fever.  Eyes:  Negative for discharge and redness.  Respiratory:  Negative for shortness of breath.   Gastrointestinal:  Negative for nausea and vomiting.  Skin:  Positive for wound. Negative for color change.     Physical Exam Triage Vital Signs ED Triage Vitals  Encounter Vitals Group     BP 05/19/23 1608 (!) 159/85     Systolic BP Percentile --      Diastolic BP Percentile --      Pulse Rate  05/19/23 1608 85     Resp 05/19/23 1608 18     Temp 05/19/23 1608 98 F (36.7 C)     Temp src --      SpO2  05/19/23 1608 95 %     Weight --      Height --      Head Circumference --      Peak Flow --      Pain Score 05/19/23 1613 0     Pain Loc --      Pain Education --      Exclude from Growth Chart --    No data found.  Updated Vital Signs BP (!) 159/85   Pulse 85   Temp 98 F (36.7 C)   Resp 18   SpO2 95%   Visual Acuity Right Eye Distance:   Left Eye Distance:   Bilateral Distance:    Right Eye Near:   Left Eye Near:    Bilateral Near:     Physical Exam Vitals and nursing note reviewed.  Constitutional:      General: She is not in acute distress.    Appearance: Normal appearance. She is not ill-appearing.  HENT:     Head: Normocephalic and atraumatic.  Eyes:     Conjunctiva/sclera: Conjunctivae normal.  Cardiovascular:     Rate and Rhythm: Normal rate.  Pulmonary:     Effort: Pulmonary effort is normal. No respiratory distress.  Skin:    Comments: Multiple superficial abrasions to right lower leg with mild surrounding erythema noted no bleeding or drainage present  Neurological:     Mental Status: She is alert.  Psychiatric:        Mood and Affect: Mood normal.        Behavior: Behavior normal.        Thought Content: Thought content normal.      UC Treatments / Results  Labs (all labs ordered are listed, but only abnormal results are displayed) Labs Reviewed - No data to display  EKG   Radiology No results found.  Procedures Procedures (including critical care time)  Medications Ordered in UC Medications - No data to display  Initial Impression / Assessment and Plan / UC Course  I have reviewed the triage vital signs and the nursing notes.  Pertinent labs & imaging results that were available during my care of the patient were reviewed by me and considered in my medical decision making (see chart for details).    Given mild  erythema will treat to cover infection.  Cephalexin prescribed.  Recommended follow-up if no gradual improvement with any further concerns  Final Clinical Impressions(s) / UC Diagnoses   Final diagnoses:  Infected abrasion of right lower extremity, initial encounter   Discharge Instructions   None    ED Prescriptions     Medication Sig Dispense Auth. Provider   cephALEXin (KEFLEX) 250 MG capsule Take 1 capsule (250 mg total) by mouth 4 (four) times daily for 7 days. 28 capsule Tomi Bamberger, PA-C      PDMP not reviewed this encounter.   Tomi Bamberger, PA-C 05/19/23 1635

## 2023-06-23 DIAGNOSIS — E119 Type 2 diabetes mellitus without complications: Secondary | ICD-10-CM | POA: Diagnosis not present

## 2023-08-01 DIAGNOSIS — M1611 Unilateral primary osteoarthritis, right hip: Secondary | ICD-10-CM | POA: Diagnosis not present

## 2023-08-01 DIAGNOSIS — M545 Low back pain, unspecified: Secondary | ICD-10-CM | POA: Diagnosis not present

## 2023-09-15 ENCOUNTER — Ambulatory Visit
Admission: RE | Admit: 2023-09-15 | Discharge: 2023-09-15 | Disposition: A | Discharge status: Home / Self Care | Payer: Medicare Other | Source: Ambulatory Visit | Attending: Internal Medicine | Admitting: Internal Medicine

## 2023-09-16 ENCOUNTER — Ambulatory Visit: Admission: RE | Admit: 2023-09-16 | Payer: Medicare Other | Source: Ambulatory Visit

## 2023-09-29 ENCOUNTER — Encounter: Payer: Self-pay | Admitting: Pulmonary Disease

## 2023-09-30 ENCOUNTER — Ambulatory Visit
Admission: RE | Admit: 2023-09-30 | Discharge: 2023-09-30 | Disposition: A | Discharge status: Home / Self Care | Payer: Medicare Other | Source: Ambulatory Visit | Attending: Internal Medicine | Admitting: Internal Medicine

## 2023-09-30 DIAGNOSIS — M810 Age-related osteoporosis without current pathological fracture: Secondary | ICD-10-CM | POA: Insufficient documentation

## 2023-09-30 MED ORDER — DENOSUMAB 60 MG/ML ~~LOC~~ SOSY
60.0000 mg | PREFILLED_SYRINGE | Freq: Once | SUBCUTANEOUS | Status: AC
Start: 2023-09-30 — End: 2023-09-30
  Administered 2023-09-30: 60 mg via SUBCUTANEOUS
  Filled 2023-09-30: qty 1

## 2023-10-02 ENCOUNTER — Ambulatory Visit (INDEPENDENT_AMBULATORY_CARE_PROVIDER_SITE_OTHER): Payer: Medicare Other | Admitting: Podiatry

## 2023-10-02 DIAGNOSIS — B351 Tinea unguium: Secondary | ICD-10-CM

## 2023-10-02 DIAGNOSIS — M79675 Pain in left toe(s): Secondary | ICD-10-CM

## 2023-10-02 DIAGNOSIS — D689 Coagulation defect, unspecified: Secondary | ICD-10-CM | POA: Diagnosis not present

## 2023-10-02 DIAGNOSIS — M21371 Foot drop, right foot: Secondary | ICD-10-CM

## 2023-10-02 DIAGNOSIS — M79674 Pain in right toe(s): Secondary | ICD-10-CM | POA: Diagnosis not present

## 2023-10-11 ENCOUNTER — Encounter: Payer: Self-pay | Admitting: Podiatry

## 2023-10-11 NOTE — Progress Notes (Signed)
Subjective: [name] presents today referred by Rodrigo Ran, MD   has h/o prediabetes and painful elongated overgrown toenails which are tender when wearing enclosed shoe gear. She is s/p CVA with h/o drop foot RLE. Past Medical History:  Diagnosis Date   Allergic rhinitis    Clotting disorder (HCC)    Factor 5   Diabetes mellitus without complication (HCC)    no meds-diet controlled   Factor 5 Leiden mutation, heterozygous (HCC)    Family history of adverse reaction to anesthesia    Daughter was "paralyzed" for several days after 1 surgery.   Heart murmur    mild mitral regurg-echo 2010   Hyperlipidemia    Hypertension    Lupus anticoagulant positive    Obesity    Pre-diabetes    Primary localized osteoarthritis of left hip 03/30/2019   Stroke [facility]) 2010   Some right sided weakness.  Gait issues.   Wears dentures    partial upper     Patient Active Problem List   Diagnosis Date Noted   Osteoporosis 02/15/2022   Primary localized osteoarthritis of left hip 03/30/2019   Status post left hip replacement 03/30/2019     Past Surgical History:  Procedure Laterality Date   ABDOMINAL HYSTERECTOMY  1981   APPENDECTOMY     AXILLARY LYMPH NODE BIOPSY Left 05/16/2014   Procedure: EXCISION 2CM LEFT AXILLARY MASS;  Surgeon: Emelia Loron, MD;  Location: [facility];  Service: General;  Laterality: Left;  Left axillary sebaceous cyst excision   CATARACT EXTRACTION W/PHACO Left 04/11/2020   Procedure: CATARACT EXTRACTION PHACO AND INTRAOCULAR LENS PLACEMENT (IOC) LEFT DIABETIC TORIC LENS  6.05 00:34.9;  Surgeon: Galen Manila, MD;  Location: [facility];  Service: Ophthalmology;  Laterality: Left;   CATARACT EXTRACTION W/PHACO Right 12/12/2020   Procedure: CATARACT EXTRACTION PHACO AND INTRAOCULAR LENS PLACEMENT (IOC) RIGHT TORIC LENS;  Surgeon: Galen Manila, MD;  Location: [facility];  Service: Ophthalmology;  Laterality: Right;   6.55 0:41.1   DILATION AND CURETTAGE OF UTERUS     SKIN CANCER EXCISION  2016   anlke   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   Procedure: TOTAL HIP ARTHROPLASTY;  Surgeon: Teryl Lucy, MD;  Location: [facility];  Service: Orthopedics;  Laterality: Left;     Current Outpatient Medications on File Prior to Visit  Medication Sig Dispense Refill   acetaminophen (TYLENOL) 650 MG CR tablet Take 1,300 mg by mouth every 8 (eight) hours.     amLODipine (NORVASC) 5 MG tablet Take 5 mg by mouth 2 (two) times daily.      B Complex Vitamins (B COMPLEX PO) Take 1 tablet by mouth daily.      BIOTIN PO Take 1 tablet by mouth daily.      cholecalciferol (VITAMIN D) 1000 UNITS tablet Take 1,000 Units by mouth daily.      diclofenac sodium (VOLTAREN) 1 % GEL Apply 1 application topically 3 (three) times daily as needed (pain). (Patient not taking: Reported on 12/12/2020)     escitalopram (LEXAPRO) 10 MG tablet Take 10 mg by mouth daily.  11   ezetimibe (ZETIA) 10 MG tablet Take 5 mg by mouth 3 (three) times a week.      fexofenadine (ALLEGRA) 60 MG tablet Take 60 mg by mouth 2 (two) times daily as needed for allergies or rhinitis.     fluticasone (FLONASE) 50 MCG/ACT nasal spray Place 2 sprays into both nostrils daily as needed for allergies.  1   lisinopril (PRINIVIL,ZESTRIL) 10 MG tablet Take 10 mg by mouth 2 (two) times daily.   2   Magnesium 250 MG TABS Take 250 mg by mouth daily.      simvastatin (ZOCOR) 20 MG tablet Take 20 mg by mouth at bedtime.      vitamin B-12 (CYANOCOBALAMIN) 1000 MCG tablet Take 1,000 mcg by mouth daily.     warfarin (COUMADIN) 5 MG tablet Take 5 mg by mouth daily. Take 5 mg by mouth daily     No current facility-administered medications on file prior to visit.     Allergies  Allergen Reactions   Baclofen     Other Reaction(s): felt muscles were weaker.   Codeine     hallucination   Duloxetine Hcl     Other Reaction(s): could not tolerate.terrible shakes, breathing  problems.heart race   Influenza Vac Split Quad     Other Reaction(s): feverish red raised at site   Nitrofurantoin     Other Reaction(s): headaches whole time on med, didnt feel well   Sertraline     Other Reaction(s): pt didnt' like how she felt.     Social History   Occupational History   Occupation: Retired  Tobacco Use   Smoking status: Former    Current packs/day: 0.00    Types: Cigarettes    Quit date: 03/14/2009    Years since quitting: 14.5   Smokeless tobacco: Never  Vaping Use   Vaping status: Never Used  Substance and Sexual Activity   Alcohol use: No    Alcohol/week: 0.0 standard drinks of alcohol   Drug use: No   Sexual activity: Not on file     Family History  Problem Relation Age of Onset   Heart disease Mother    COPD Father    Colon cancer Sister    Clotting disorder Sister    Diabetes Sister    Kidney disease Sister    Heart disease Brother      Immunization History  Administered Date(s) Administered   PFIZER(Purple Top)SARS-COV-2 Vaccination 11/25/2019, 12/21/2019   Tdap 02/27/2021     Objective: There were no vitals filed for this visit.  [name] is a pleasant 77 y.o. female in NAD. AAO x 3.  Vascular Examination: Faintly palpable pedal pulses. Pedal hair present b/l. CFT <3 seconds b/l. Trace edema b/l. No pain with calf compression b/l. Skin temperature gradient WNL b/l.   Neurological Examination: Sensation grossly intact b/l with 10 gram monofilament. Vibratory sensation intact b/l.   Dermatological Examination: Pedal skin with normal turgor, texture and tone b/l. Toenails 1-5 b/l thick, discolored, elongated with subungual debris and pain on dorsal palpation. No hyperkeratotic lesions noted b/l.   Musculoskeletal Examination: Dropfoot right foot. Muscle strength 5/5 to all LE muscle groups of left foot. Utilizes rollator for ambulation assistance.  Radiographs: None  Last A1c:       No data to display          Assessment: 1. Pain due to onychomycosis of toenails of both feet   2. Foot drop, right   3. Clotting disorder (HCC)     Plan: -Patient was evaluated today. All questions/concerns addressed on today's visit. -Patient to continue soft, supportive shoe gear daily. -Toenails 1-5 b/l were debrided in length and girth with sterile nail nippers and dremel without iatrogenic bleeding.  -Patient/POA to call should there be question/concern in the interim.  Return in about 3 months (around 12/31/2023).  Freddie Breech,  DPM      [place] LOCATION: [address] [place] [address]                   Office [phone]   [facility] LOCATION: [address] [place], [place] [address] Office [phone]

## 2023-10-13 ENCOUNTER — Encounter: Payer: Self-pay | Admitting: Pulmonary Disease

## 2023-10-29 ENCOUNTER — Encounter: Payer: Self-pay | Admitting: Pulmonary Disease

## 2023-11-14 ENCOUNTER — Ambulatory Visit: Payer: Medicare PPO | Admitting: Cardiovascular Disease

## 2023-11-24 ENCOUNTER — Telehealth: Payer: Self-pay | Admitting: Cardiology

## 2023-11-24 NOTE — Telephone Encounter (Signed)
   Pre-operative Risk Assessment    Patient Name: [name]  DOB: [birth year] MRN: [id number]   Date of last office visit: n/a Date of next office visit: 12/18/23   Request for Surgical Clearance    Procedure:  Right total hip Arthroplasty  Date of Surgery:  Clearance TBD                                Surgeon:  Osa Blase, MD Surgeon's Group or Practice Name:  [facility] Phone number:  [phone] Fax number:  [phone]   Type of Clearance Requested:  Pharmacy, Warfarin    Type of Anesthesia:  Not Indicated   Additional requests/questions:    Signed, Fletcher Humble   11/24/2023, 4:00 PM

## 2023-12-18 ENCOUNTER — Ambulatory Visit: Attending: Cardiology | Admitting: Cardiology

## 2023-12-18 ENCOUNTER — Encounter: Payer: Self-pay | Admitting: Cardiology

## 2023-12-18 ENCOUNTER — Ambulatory Visit: Payer: Medicare PPO | Admitting: Cardiology

## 2023-12-18 VITALS — BP 146/74 | HR 84 | Ht 64.0 in | Wt 193.8 lb

## 2023-12-18 DIAGNOSIS — Z01810 Encounter for preprocedural cardiovascular examination: Secondary | ICD-10-CM

## 2023-12-18 DIAGNOSIS — E7800 Pure hypercholesterolemia, unspecified: Secondary | ICD-10-CM

## 2023-12-18 DIAGNOSIS — I340 Nonrheumatic mitral (valve) insufficiency: Secondary | ICD-10-CM

## 2023-12-18 DIAGNOSIS — I10 Essential (primary) hypertension: Secondary | ICD-10-CM

## 2023-12-18 NOTE — Progress Notes (Signed)
 Cardiology Office Note:    Date:  12/18/2023   ID:  [name], DOB Jan 06, 1946, MRN [id number]  PCP:  Tiffany Ran, MD   [facility] Providers Cardiologist:  None     Referring MD: Tiffany Ran, MD   Chief Complaint  Patient presents with   Pre operative clearance    Patient needing preoperative clearance for hip replacement surgery. Meds reviewed.     History of Present Illness:    [name] is a 78 y.o. female with a hx of hypertension, hyperlipidemia, factor V Leyden mutation, lupus anticoagulant positive on Coumadin, diabetes, CVA 2010 who presents for preop evaluation.  Has right hip osteoarthritis, surgery being planned.  Has a history of stroke in 2010 resulting in right-sided weakness.  Had left hip surgery in 2022 with no issues.  She denies chest pain or shortness of breath, denies any personal history of heart disease or heart attacks.  EMR reports mild MR from an echo back in 2010, could not find actual echo results.  Primary care physician manages Coumadin.  Past Medical History:  Diagnosis Date   Allergic rhinitis    Clotting disorder (HCC)    Factor 5   Diabetes mellitus without complication (HCC)    no meds-diet controlled   Factor 5 Leiden mutation, heterozygous (HCC)    Family history of adverse reaction to anesthesia    Daughter was "paralyzed" for several days after 1 surgery.   Heart murmur    mild mitral regurg-echo 2010   Hyperlipidemia    Hypertension    Lupus anticoagulant positive    Obesity    Pre-diabetes    Primary localized osteoarthritis of left hip 03/30/2019   Stroke [facility]) 2010   Some right sided weakness.  Gait issues.   Wears dentures    partial upper    Past Surgical History:  Procedure Laterality Date   ABDOMINAL HYSTERECTOMY  1981   APPENDECTOMY     AXILLARY LYMPH NODE BIOPSY Left 05/16/2014   Procedure: EXCISION 2CM LEFT AXILLARY MASS;  Surgeon: Tiffany Loron, MD;  Location: [facility];   Service: General;  Laterality: Left;  Left axillary sebaceous cyst excision   CATARACT EXTRACTION W/PHACO Left 04/11/2020   Procedure: CATARACT EXTRACTION PHACO AND INTRAOCULAR LENS PLACEMENT (IOC) LEFT DIABETIC TORIC LENS  6.05 00:34.9;  Surgeon: Tiffany Manila, MD;  Location: [facility];  Service: Ophthalmology;  Laterality: Left;   CATARACT EXTRACTION W/PHACO Right 12/12/2020   Procedure: CATARACT EXTRACTION PHACO AND INTRAOCULAR LENS PLACEMENT (IOC) RIGHT TORIC LENS;  Surgeon: Tiffany Manila, MD;  Location: [facility];  Service: Ophthalmology;  Laterality: Right;  6.55 0:41.1   DILATION AND CURETTAGE OF UTERUS     SKIN CANCER EXCISION  2016   anlke   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   Procedure: TOTAL HIP ARTHROPLASTY;  Surgeon: Tiffany Lucy, MD;  Location: [facility];  Service: Orthopedics;  Laterality: Left;    Current Medications: Current Meds  Medication Sig   acetaminophen (TYLENOL) 650 MG CR tablet Take 1,300 mg by mouth every 8 (eight) hours.   amLODipine (NORVASC) 5 MG tablet Take 5 mg by mouth 2 (two) times daily.    B Complex Vitamins (B COMPLEX PO) Take 1 tablet by mouth daily.    BIOTIN PO Take 1 tablet by mouth daily.    cholecalciferol (VITAMIN D) 1000 UNITS tablet Take 1,000 Units by mouth daily.    denosumab (PROLIA) 60 MG/ML SOSY injection Inject 60 mg into  the skin every 6 (six) months.   escitalopram (LEXAPRO) 10 MG tablet Take 10 mg by mouth daily.   ezetimibe (ZETIA) 10 MG tablet Take 5 mg by mouth 3 (three) times a week.    fexofenadine (ALLEGRA) 60 MG tablet Take 60 mg by mouth 2 (two) times daily as needed for allergies or rhinitis.   fluticasone (FLONASE) 50 MCG/ACT nasal spray Place 2 sprays into both nostrils daily as needed for allergies.    lisinopril (PRINIVIL,ZESTRIL) 10 MG tablet Take 10 mg by mouth 2 (two) times daily.    Magnesium 250 MG TABS Take 250 mg by mouth daily.    simvastatin (ZOCOR) 20 MG tablet Take 20 mg by mouth at  bedtime.    vitamin B-12 (CYANOCOBALAMIN) 1000 MCG tablet Take 1,000 mcg by mouth daily.   warfarin (COUMADIN) 5 MG tablet Take 5 mg by mouth daily. Take 5 mg by mouth daily     Allergies:   Baclofen, Codeine, Duloxetine hcl, Influenza vac split quad, Nitrofurantoin, and Sertraline   Social History   Socioeconomic History   Marital status: Divorced    Spouse name: Not on file   Number of children: 2   Years of education: Not on file   Highest education level: Not on file  Occupational History   Occupation: Retired  Tobacco Use   Smoking status: Former    Current packs/day: 0.00    Types: Cigarettes    Quit date: 03/14/2009    Years since quitting: 14.7   Smokeless tobacco: Never  Vaping Use   Vaping status: Never Used  Substance and Sexual Activity   Alcohol use: No    Alcohol/week: 0.0 standard drinks of alcohol   Drug use: No   Sexual activity: Not on file  Other Topics Concern   Not on file  Social History Narrative   Not on file   Social Drivers of Health   Financial Resource Strain: Not on file  Food Insecurity: Not on file  Transportation Needs: Not on file  Physical Activity: Not on file  Stress: Not on file  Social Connections: Not on file     Family History: The patient's family history includes COPD in her father; Clotting disorder in her sister; Colon cancer in her sister; Diabetes in her sister; Heart disease in her brother and mother; Kidney disease in her sister.  ROS:   Please see the history of present illness.     All other systems reviewed and are negative.  EKGs/Labs/Other Studies Reviewed:    The following studies were reviewed today:  EKG Interpretation Date/Time:  Thursday December 18 2023 10:12:51 EST Ventricular Rate:  84 PR Interval:  116 QRS Duration:  74 QT Interval:  386 QTC Calculation: 456 R Axis:   3  Text Interpretation: Normal sinus rhythm Low voltage QRS Nonspecific ST and T wave abnormality Confirmed by Debbe Odea  [phone]) on 12/18/2023 10:26:47 AM    Recent Labs: No results found for requested labs within last 365 days.  Recent Lipid Panel    Component Value Date/Time   CHOL  08/31/2009 0550    135        ATP III CLASSIFICATION:  <200     mg/dL   Desirable  295-284  mg/dL   Borderline High  >=132    mg/dL   High          TRIG 81 08/31/2009 0550   HDL 28 (L) 08/31/2009 0550   CHOLHDL 4.8 08/31/2009 0550  VLDL 16 08/31/2009 0550   LDLCALC  08/31/2009 0550    91        Total Cholesterol/HDL:CHD Risk Coronary Heart Disease Risk Table                     Men   Women  1/2 Average Risk   3.4   3.3  Average Risk       5.0   4.4  2 X Average Risk   9.6   7.1  3 X Average Risk  23.4   11.0        Use the calculated Patient Ratio above and the CHD Risk Table to determine the patient's CHD Risk.        ATP III CLASSIFICATION (LDL):  <100     mg/dL   Optimal  782-956  mg/dL   Near or Above                    Optimal  130-159  mg/dL   Borderline  213-086  mg/dL   High  >578     mg/dL   Very High     Risk Assessment/Calculations:         Physical Exam:    VS:  BP (!) 146/74   Pulse 84   Ht 5\' 4"  (1.626 m)   Wt 193 lb 12.8 oz (87.9 kg)   SpO2 93%   BMI 33.27 kg/m     Wt Readings from Last 3 Encounters:  12/18/23 193 lb 12.8 oz (87.9 kg)  02/27/21 180 lb (81.6 kg)  12/12/20 182 lb (82.6 kg)     GEN:  Well nourished, well developed in no acute distress HEENT: Normal NECK: No JVD; No carotid bruits CARDIAC: RRR, no murmurs, rubs, gallops RESPIRATORY:  Clear to auscultation without rales, wheezing or rhonchi  ABDOMEN: Soft, non-tender, non-distended MUSCULOSKELETAL:  No edema; No deformity  SKIN: Warm and dry NEUROLOGIC:  Alert and oriented x 3 PSYCHIATRIC:  Normal affect   ASSESSMENT:    1. Pre-procedural cardiovascular examination   2. Primary hypertension   3. Pure hypercholesterolemia   4. Mitral valve insufficiency, unspecified etiology    PLAN:    In order of  problems listed above:  Preop exam, denies chest pain.  History of mild MR.  Obtain echocardiogram, okay for surgical procedure if no significant structural abnormalities noted. Hypertension, BP controlled at home.  Continue lisinopril 10 mg daily, Norvasc 5 mg daily. Hyperlipidemia, continue Zocor 20 mg daily, obtain lipid profile from PCP. Mild MR, follow-up with echo as above.  Follow-up after cardiac testing.     Medication Adjustments/Labs and Tests Ordered: Current medicines are reviewed at length with the patient today.  Concerns regarding medicines are outlined above.  Orders Placed This Encounter  Procedures   EKG 12-Lead   ECHOCARDIOGRAM COMPLETE   No orders of the defined types were placed in this encounter.   Patient Instructions  Medication Instructions:  No changes *If you need a refill on your cardiac medications before your next appointment, please call your pharmacy*   Lab Work: None ordered If you have labs (blood work) drawn today and your tests are completely normal, you will receive your results only by: MyChart Message (if you have MyChart) OR A paper copy in the mail If you have any lab test that is abnormal or we need to change your treatment, we will call you to review the results.   Testing/Procedures: Your physician has requested  that you have an echocardiogram. Echocardiography is a painless test that uses sound waves to create images of your heart. It provides your doctor with information about the size and shape of your heart and how well your heart's chambers and valves are working.   You may receive an ultrasound enhancing agent through an IV if needed to better visualize your heart during the echo. This procedure takes approximately one hour.  There are no restrictions for this procedure.  This will take place at [address] [facility] [address]) [address], [place] [address]  Please note: We ask at that you not bring children with you  during ultrasound (echo/ vascular) testing. Due to room size and safety concerns, children are not allowed in the ultrasound rooms during exams. Our front office staff cannot provide observation of children in our lobby area while testing is being conducted. An adult accompanying a patient to their appointment will only be allowed in the ultrasound room at the discretion of the ultrasound technician under special circumstances. We apologize for any inconvenience.    Follow-Up: At [facility], you and your health needs are our priority.  As part of our continuing mission to provide you with exceptional heart care, we have created designated Provider Care Teams.  These Care Teams include your primary Cardiologist (physician) and Advanced Practice Providers (APPs -  Physician Assistants and Nurse Practitioners) who all work together to provide you with the care you need, when you need it.  We recommend signing up for the patient portal called "MyChart".  Sign up information is provided on this After Visit Summary.  MyChart is used to connect with patients for Virtual Visits (Telemedicine).  Patients are able to view lab/test results, encounter notes, upcoming appointments, etc.  Non-urgent messages can be sent to your provider as well.   To learn more about what you can do with MyChart, go to [email or web address].    Your next appointment:   4 month(s)  Provider:   You will see one of the following Advanced Practice Providers on your designated Care Team:   Nicolasa Ducking, NP Eula Listen, PA-C Cadence Fransico Michael, PA-C Charlsie Quest, NP Carlos Levering, NP  Other Instructions We will look into seeing if you can be seen in our coumadin clinic.        Signed, Debbe Odea, MD  12/18/2023 11:14 AM    [facility]

## 2023-12-18 NOTE — Patient Instructions (Signed)
 Medication Instructions:  No changes *If you need a refill on your cardiac medications before your next appointment, please call your pharmacy*   Lab Work: None ordered If you have labs (blood work) drawn today and your tests are completely normal, you will receive your results only by: MyChart Message (if you have MyChart) OR A paper copy in the mail If you have any lab test that is abnormal or we need to change your treatment, we will call you to review the results.   Testing/Procedures: Your physician has requested that you have an echocardiogram. Echocardiography is a painless test that uses sound waves to create images of your heart. It provides your doctor with information about the size and shape of your heart and how well your heart's chambers and valves are working.   You may receive an ultrasound enhancing agent through an IV if needed to better visualize your heart during the echo. This procedure takes approximately one hour.  There are no restrictions for this procedure.  This will take place at [address] [facility] [address], [place] [address]  Please note: We ask at that you not bring children with you during ultrasound (echo/ vascular) testing. Due to room size and safety concerns, children are not allowed in the ultrasound rooms during exams. Our front office staff cannot provide observation of children in our lobby area while testing is being conducted. An adult accompanying a patient to their appointment will only be allowed in the ultrasound room at the discretion of the ultrasound technician under special circumstances. We apologize for any inconvenience.    Follow-Up: At [facility], you and your health needs are our priority.  As part of our continuing mission to provide you with exceptional heart care, we have created designated Provider Care Teams.  These Care Teams include your primary Cardiologist (physician) and Advanced Practice  Providers (APPs -  Physician Assistants and Nurse Practitioners) who all work together to provide you with the care you need, when you need it.  We recommend signing up for the patient portal called "MyChart".  Sign up information is provided on this After Visit Summary.  MyChart is used to connect with patients for Virtual Visits (Telemedicine).  Patients are able to view lab/test results, encounter notes, upcoming appointments, etc.  Non-urgent messages can be sent to your provider as well.   To learn more about what you can do with MyChart, go to [email or web address].    Your next appointment:   4 month(s)  Provider:   You will see one of the following Advanced Practice Providers on your designated Care Team:   Nicolasa Ducking, NP Eula Listen, PA-C Cadence Fransico Michael, PA-C Charlsie Quest, NP Carlos Levering, NP  Other Instructions We will look into seeing if you can be seen in our coumadin clinic.

## 2023-12-31 ENCOUNTER — Ambulatory Visit
Admission: RE | Admit: 2023-12-31 | Discharge: 2023-12-31 | Disposition: A | Discharge status: Home / Self Care | Source: Ambulatory Visit | Attending: Cardiology | Admitting: Cardiology

## 2023-12-31 DIAGNOSIS — E119 Type 2 diabetes mellitus without complications: Secondary | ICD-10-CM | POA: Diagnosis not present

## 2023-12-31 DIAGNOSIS — R011 Cardiac murmur, unspecified: Secondary | ICD-10-CM | POA: Insufficient documentation

## 2023-12-31 DIAGNOSIS — E785 Hyperlipidemia, unspecified: Secondary | ICD-10-CM | POA: Insufficient documentation

## 2023-12-31 DIAGNOSIS — I340 Nonrheumatic mitral (valve) insufficiency: Secondary | ICD-10-CM | POA: Diagnosis present

## 2023-12-31 LAB — ECHOCARDIOGRAM COMPLETE
AR max vel: 2.19 cm2
AV Area VTI: 2.35 cm2
AV Area mean vel: 2 cm2
AV Mean grad: 3 mmHg
AV Peak grad: 5 mmHg
Ao pk vel: 1.12 m/s
Area-P 1/2: 3.21 cm2
MV VTI: 3.1 cm2
S' Lateral: 2.4 cm

## 2023-12-31 NOTE — Progress Notes (Signed)
*  PRELIMINARY RESULTS* Echocardiogram 2D Echocardiogram has been performed.  Cristela Blue 12/31/2023, 11:18 AM

## 2024-01-01 ENCOUNTER — Ambulatory Visit (INDEPENDENT_AMBULATORY_CARE_PROVIDER_SITE_OTHER): Payer: Medicare Other | Admitting: Podiatry

## 2024-01-01 DIAGNOSIS — Z91198 Patient's noncompliance with other medical treatment and regimen for other reason: Secondary | ICD-10-CM

## 2024-01-01 NOTE — Progress Notes (Signed)
 1. Failure to attend appointment with reason given    Patient had hip surgery and is still having pain. Will call back to reschedule

## 2024-01-02 ENCOUNTER — Encounter: Payer: Self-pay | Admitting: *Deleted

## 2024-01-09 ENCOUNTER — Ambulatory Visit: Payer: Medicare PPO | Admitting: Cardiology

## 2024-01-28 DIAGNOSIS — D689 Coagulation defect, unspecified: Secondary | ICD-10-CM | POA: Diagnosis not present

## 2024-01-28 DIAGNOSIS — Z7901 Long term (current) use of anticoagulants: Secondary | ICD-10-CM | POA: Diagnosis not present

## 2024-02-03 DIAGNOSIS — Z7901 Long term (current) use of anticoagulants: Secondary | ICD-10-CM | POA: Diagnosis not present

## 2024-02-06 ENCOUNTER — Telehealth: Payer: Self-pay | Admitting: Cardiology

## 2024-02-06 NOTE — Telephone Encounter (Signed)
   Pre-operative Risk Assessment    Patient Name: [name]  DOB: 1946-08-14 MRN: [id number]   Date of last office visit: 12/18/23 Date of next office visit: 04/19/24   Request for Surgical Clearance    Procedure:  Right Total Hip Arthroplasty  Date of Surgery:  Clearance TBD                                Surgeon:  Osa Blase, MD Surgeon's Group or Practice Name:  [facility] Phone number:  [phone] Fax number:  [phone]   Type of Clearance Requested:   Hold warfarin   Type of Anesthesia:  Not Indicated   Additional requests/questions:    SignedFletcher Humble   02/06/2024, 8:24 AM

## 2024-02-06 NOTE — Telephone Encounter (Signed)
   Patient Name: [name]  DOB: 15-Feb-1946 MRN: [id number]  Primary Cardiologist: Constancia Delton, MD  Chart reviewed as part of pre-operative protocol coverage. Given past medical history and time since last visit, based on ACC/AHA guidelines, PSALM ARMAN is at acceptable risk for the planned procedure without further cardiovascular testing.   Based on recent echocardiogram result, Dr. Junnie Olives commented "Echo shows normal systolic function, no significant structural abnormalities.  Okay for surgical procedure from a cardiac perspective."  I will route this recommendation to the requesting party via Epic fax function and remove from pre-op pool.  Please call with questions.  Laurenashley Viar, [place] 02/06/2024, 8:36 AM

## 2024-02-06 NOTE — Telephone Encounter (Signed)
 Patient is on Coumadin  for factor V Leiden and positive lupus anticoagulant.  Coumadin  level is being managed by PCP, patient will need to contact her Coumadin  clinic to discuss coming off of Coumadin  around the time of surgery.  She may require Lovenox  bridging.

## 2024-02-21 DIAGNOSIS — D689 Coagulation defect, unspecified: Secondary | ICD-10-CM | POA: Diagnosis not present

## 2024-02-21 DIAGNOSIS — Z7901 Long term (current) use of anticoagulants: Secondary | ICD-10-CM | POA: Diagnosis not present

## 2024-03-25 DIAGNOSIS — R7301 Impaired fasting glucose: Secondary | ICD-10-CM | POA: Diagnosis not present

## 2024-03-25 DIAGNOSIS — Z7901 Long term (current) use of anticoagulants: Secondary | ICD-10-CM | POA: Diagnosis not present

## 2024-03-25 DIAGNOSIS — D689 Coagulation defect, unspecified: Secondary | ICD-10-CM | POA: Diagnosis not present

## 2024-03-29 DIAGNOSIS — M1611 Unilateral primary osteoarthritis, right hip: Secondary | ICD-10-CM | POA: Diagnosis not present

## 2024-03-30 ENCOUNTER — Ambulatory Visit
Admission: RE | Admit: 2024-03-30 | Discharge: 2024-03-30 | Disposition: A | Discharge status: Home / Self Care | Payer: Medicare Other | Source: Ambulatory Visit | Attending: Internal Medicine

## 2024-03-31 NOTE — Care Plan (Signed)
 Ortho Bundle Case Management Note  Patient Details  Name: [name] MRN: [id number] Date of Birth: 10/20/1945   met with patient and son in the office for H&P. will discharge to home with family to assist. rolling walker ordered for home use. [facility]. [facility]. discharge instructions discussed and questions answered. Patient and MD in agreement with plan. Choice offered                   DME Arranged:  Walker rolling DME Agency:  [employer]  HH Arranged:  PT HH Agency:  [facility]  Additional Comments: Please contact me with any questions of if this plan should need to change.  Cornelia Dieter,  RN,BSN,MHA,CCM  [facility]  [phone] 03/31/2024, 3:44 PM

## 2024-04-05 ENCOUNTER — Other Ambulatory Visit (HOSPITAL_COMMUNITY)

## 2024-04-06 ENCOUNTER — Encounter (HOSPITAL_COMMUNITY): Payer: Self-pay

## 2024-04-06 NOTE — Pre-Procedure Instructions (Addendum)
 Surgical Instructions   Your procedure is scheduled on April 13, 2024. Report to [facility] Main Entrance A at 10:30 A.M., then check in with the Admitting office. Any questions or running late day of surgery: call [phone]  Questions prior to your surgery date: call [phone], Monday-Friday, 8am-4pm. If you experience any cold or flu symptoms such as cough, fever, chills, shortness of breath, etc. between now and your scheduled surgery, please notify us  at the above number.     Remember:  Do not eat after midnight the night before your surgery  You may drink clear liquids until 9:30 AM the morning of your surgery.   Clear liquids allowed are: Water , Non-Citrus Juices (without pulp), Carbonated Beverages, Clear Tea (no milk, honey, etc.), Black Coffee Only (NO MILK, CREAM OR POWDERED CREAMER of any kind), and Gatorade.   Patient Instructions  The night before surgery:  No food after midnight. ONLY clear liquids after midnight  The day of surgery (if you have diabetes): Drink ONE (1) 12 oz G2 given to you in your pre admission testing appointment by 9:30 AM the morning of surgery. Drink in one sitting. Do not sip.  This drink was given to you during your hospital  pre-op appointment visit.  Nothing else to drink after completing the  12 oz bottle of G2.         If you have questions, please contact your surgeon's office.   Take these medicines the morning of surgery with A SIP OF WATER : acetaminophen  (TYLENOL )  amLODipine  (NORVASC )  escitalopram  (LEXAPRO )    May take these medicines IF NEEDED: fexofenadine (ALLEGRA)  fluticasone  (FLONASE ) nasal spray    Please follow your providers instructions on stopping your warfarin (COUMADIN ) and starting your Lovenox  injections.   One week prior to surgery, STOP taking any Aspirin (unless otherwise instructed by your surgeon) Aleve, Naproxen, Ibuprofen, Motrin, Advil, Goody's, BC's, all herbal medications, fish oil, and  non-prescription vitamins.                     Do NOT Smoke (Tobacco/Vaping) for 24 hours prior to your procedure.  If you use a CPAP at night, you may bring your mask/headgear for your overnight stay.   You will be asked to remove any contacts, glasses, piercing's, hearing aid's, dentures/partials prior to surgery. Please bring cases for these items if needed.    Patients discharged the day of surgery will not be allowed to drive home, and someone needs to stay with them for 24 hours.  SURGICAL WAITING ROOM VISITATION Patients may have no more than 2 support people in the waiting area - these visitors may rotate.   Pre-op nurse will coordinate an appropriate time for 1 ADULT support person, who may not rotate, to accompany patient in pre-op.  Children under the age of 74 must have an adult with them who is not the patient and must remain in the main waiting area with an adult.  If the patient needs to stay at the hospital during part of their recovery, the visitor guidelines for inpatient rooms apply.  Please refer to the [facility] website for the visitor guidelines for any additional information.   If you received a COVID test during your pre-op visit  it is requested that you wear a mask when out in public, stay away from anyone that may not be feeling well and notify your surgeon if you develop symptoms. If you have been in contact with anyone that has  tested positive in the last 10 days please notify you surgeon.      Pre-operative 5 CHG Bathing Instructions   You can play a key role in reducing the risk of infection after surgery. Your skin needs to be as free of germs as possible. You can reduce the number of germs on your skin by washing with CHG (chlorhexidine  gluconate) soap before surgery. CHG is an antiseptic soap that kills germs and continues to kill germs even after washing.   DO NOT use if you have an allergy to chlorhexidine /CHG or antibacterial soaps. If your skin  becomes reddened or irritated, stop using the CHG and notify one of our RNs at [phone].   Please shower with the CHG soap starting 4 days before surgery using the following schedule:     Please keep in mind the following:  DO NOT shave, including legs and underarms, starting the day of your first shower.   You may shave your face at any point before/day of surgery.  Place clean sheets on your bed the day you start using CHG soap. Use a clean washcloth (not used since being washed) for each shower. DO NOT sleep with pets once you start using the CHG.   CHG Shower Instructions:  Wash your face and private area with normal soap. If you choose to wash your hair, wash first with your normal shampoo.  After you use shampoo/soap, rinse your hair and body thoroughly to remove shampoo/soap residue.  Turn the water  OFF and apply about 3 tablespoons (45 ml) of CHG soap to a CLEAN washcloth.  Apply CHG soap ONLY FROM YOUR NECK DOWN TO YOUR TOES (washing for 3-5 minutes)  DO NOT use CHG soap on face, private areas, open wounds, or sores.  Pay special attention to the area where your surgery is being performed.  If you are having back surgery, having someone wash your back for you may be helpful. Wait 2 minutes after CHG soap is applied, then you may rinse off the CHG soap.  Pat dry with a clean towel  Put on clean clothes/pajamas   If you choose to wear lotion, please use ONLY the CHG-compatible lotions that are listed below.  Additional instructions for the day of surgery: DO NOT APPLY any lotions, deodorants, cologne, or perfumes.   Do not bring valuables to the hospital. [facility] is not responsible for any belongings/valuables. Do not wear nail polish, gel polish, artificial nails, or any other type of covering on natural nails (fingers and toes) Do not wear jewelry or makeup Put on clean/comfortable clothes.  Please brush your teeth.  Ask your nurse before applying any prescription  medications to the skin.     CHG Compatible Lotions   Aveeno Moisturizing lotion  Cetaphil Moisturizing Cream  Cetaphil Moisturizing Lotion  Clairol Herbal Essence Moisturizing Lotion, Dry Skin  Clairol Herbal Essence Moisturizing Lotion, Extra Dry Skin  Clairol Herbal Essence Moisturizing Lotion, Normal Skin  Curel Age Defying Therapeutic Moisturizing Lotion with Alpha Hydroxy  Curel Extreme Care Body Lotion  Curel Soothing Hands Moisturizing Hand Lotion  Curel Therapeutic Moisturizing Cream, Fragrance-Free  Curel Therapeutic Moisturizing Lotion, Fragrance-Free  Curel Therapeutic Moisturizing Lotion, Original Formula  Eucerin Daily Replenishing Lotion  Eucerin Dry Skin Therapy Plus Alpha Hydroxy Crme  Eucerin Dry Skin Therapy Plus Alpha Hydroxy Lotion  Eucerin Original Crme  Eucerin Original Lotion  Eucerin Plus Crme Eucerin Plus Lotion  Eucerin TriLipid Replenishing Lotion  Keri Anti-Bacterial Hand Lotion  Keri Deep Conditioning  Original Lotion Dry Skin Formula Softly Scented  Keri Deep Conditioning Original Lotion, Fragrance Free Sensitive Skin Formula  Keri Lotion Fast Absorbing Fragrance Free Sensitive Skin Formula  Keri Lotion Fast Absorbing Softly Scented Dry Skin Formula  Keri Original Lotion  Keri Skin Renewal Lotion Keri Silky Smooth Lotion  Keri Silky Smooth Sensitive Skin Lotion  Nivea Body Creamy Conditioning Oil  Nivea Body Extra Enriched Lotion  Nivea Body Original Lotion  Nivea Body Sheer Moisturizing Lotion Nivea Crme  Nivea Skin Firming Lotion  NutraDerm 30 Skin Lotion  NutraDerm Skin Lotion  NutraDerm Therapeutic Skin Cream  NutraDerm Therapeutic Skin Lotion  ProShield Protective Hand Cream  Provon moisturizing lotion  Please read over the following fact sheets that you were given.

## 2024-04-07 ENCOUNTER — Other Ambulatory Visit: Payer: Self-pay

## 2024-04-07 ENCOUNTER — Encounter (HOSPITAL_COMMUNITY): Payer: Self-pay

## 2024-04-07 ENCOUNTER — Encounter (HOSPITAL_COMMUNITY)
Admission: RE | Admit: 2024-04-07 | Discharge: 2024-04-07 | Disposition: A | Discharge status: Home / Self Care | Source: Ambulatory Visit | Attending: Orthopedic Surgery | Admitting: Orthopedic Surgery

## 2024-04-07 VITALS — BP 136/60 | HR 91 | Temp 98.2°F | Resp 17 | Ht 64.0 in | Wt 190.0 lb

## 2024-04-07 DIAGNOSIS — Z01812 Encounter for preprocedural laboratory examination: Secondary | ICD-10-CM | POA: Diagnosis not present

## 2024-04-07 DIAGNOSIS — M160 Bilateral primary osteoarthritis of hip: Secondary | ICD-10-CM | POA: Insufficient documentation

## 2024-04-07 DIAGNOSIS — I10 Essential (primary) hypertension: Secondary | ICD-10-CM | POA: Insufficient documentation

## 2024-04-07 DIAGNOSIS — Z8673 Personal history of transient ischemic attack (TIA), and cerebral infarction without residual deficits: Secondary | ICD-10-CM | POA: Diagnosis not present

## 2024-04-07 DIAGNOSIS — E119 Type 2 diabetes mellitus without complications: Secondary | ICD-10-CM | POA: Diagnosis not present

## 2024-04-07 DIAGNOSIS — M329 Systemic lupus erythematosus, unspecified: Secondary | ICD-10-CM | POA: Insufficient documentation

## 2024-04-07 DIAGNOSIS — Z01818 Encounter for other preprocedural examination: Secondary | ICD-10-CM

## 2024-04-07 DIAGNOSIS — D6851 Activated protein C resistance: Secondary | ICD-10-CM | POA: Diagnosis not present

## 2024-04-07 DIAGNOSIS — Z87891 Personal history of nicotine dependence: Secondary | ICD-10-CM | POA: Diagnosis not present

## 2024-04-07 HISTORY — DX: Malignant (primary) neoplasm, unspecified: C80.1

## 2024-04-07 HISTORY — DX: Pneumonia, unspecified organism: J18.9

## 2024-04-07 HISTORY — DX: Disease of blood and blood-forming organs, unspecified: D75.9

## 2024-04-07 LAB — CBC
HCT: 45.8 % (ref 36.0–46.0)
Hemoglobin: 14.6 g/dL (ref 12.0–15.0)
MCH: 31.3 pg (ref 26.0–34.0)
MCHC: 31.9 g/dL (ref 30.0–36.0)
MCV: 98.3 fL (ref 80.0–100.0)
Platelets: 278 10*3/uL (ref 150–400)
RBC: 4.66 MIL/uL (ref 3.87–5.11)
RDW: 13.2 % (ref 11.5–15.5)
WBC: 9.6 10*3/uL (ref 4.0–10.5)
nRBC: 0 % (ref 0.0–0.2)

## 2024-04-07 LAB — SURGICAL PCR SCREEN
MRSA, PCR: NEGATIVE
Staphylococcus aureus: NEGATIVE

## 2024-04-07 NOTE — Progress Notes (Signed)
 BMET hemolyzed. Will need to recollect on DOS. Lab to place repeat order.

## 2024-04-07 NOTE — Progress Notes (Signed)
 PCP - Dr. Oneil Neth Cardiologist - Dr. Redell Cave - sent for cardiac clearance. No cardiologist prior to visit 12/18/2023  PPM/ICD - Denies Device Orders - n/a Rep Notified - n/a  Chest x-ray - n/a EKG - 12/18/2023 Stress Test - Denies ECHO - 12/31/2023 Cardiac Cath - Denies  Sleep Study - Denies CPAP - n/a  Pt is Pre-DM  Last dose of GLP1 agonist- n/a GLP1 instructions: n/a  Blood Thinner Instructions: Per PCP pharmacy orders, Pt has already stopped her Warfarin on 6/23 (last dose 6/22) and started her Lovenox  injections 6/24. She will continue injections until the day before surgery and take none the morning of surgery. PT/PTT to be completed DOS. Aspirin Instructions: n/a  ERAS Protcol - Clear liquids until 0930 morning of surgery PRE-SURGERY Ensure or G2- Ensure given to pt with instructions  COVID TEST- n/a   Anesthesia review: Yes. Cardiac clearance. 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   Patient denies shortness of breath, fever, cough and chest pain at PAT appointment. Pt denies any respiratory illness/infection in the last two months.    All instructions explained to the patient, pts son Mose and daughter Lorn, with a verbal understanding of the material. Patient agrees to go over the instructions while at home for a better understanding. Patient also instructed to self quarantine after being tested for COVID-19. The opportunity to ask questions was provided.

## 2024-04-08 ENCOUNTER — Encounter (HOSPITAL_COMMUNITY): Payer: Self-pay

## 2024-04-08 NOTE — Progress Notes (Signed)
 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 Patient has no personal hx of documented complications.  Patient seen by Cardiology on 12/18/23 for pre op clearance. Patient had no cardiac symptoms. It was recommended she have an echo done which was done 12/31/23 and showed normal LVEF of 60-65%, grade I DD, no significant valvular disease. She was cleared for surgery: Echo shows normal systolic function, no significant structural abnormalities. Okay for surgical procedure from a cardiac perspective   Patient seen by PCP and cleared: she needs her right hip replaced by Dr. Josefina. i consider her cleared from a medical point of view  Patient is on Warfarin due to hx of Factor V Leiden. She will be doing a Lovenox  bridge with following instructions:  Surgery for right THA scheduled 04/13/24  Stop warfarin after dose on 04/05/24  Start enoxaparin  80mg  every 12 hours on 04/06/24  No RX on 04/13/24  On 04/14/24 restart warfarin 2.5mg  QD except 5mg  MWF and enoxaparin  80mg  every 12 hours. Overlap for at least 5 days. Recheck INR 04/19/24. If INR <2, continue enoxaparin  until INR >2   VS: BP 136/60   Pulse 91   Temp 36.8 C   Resp 17   Ht 5' 4 (1.626 m)   Wt 86.2 kg   SpO2 95%   BMI 32.61 kg/m   PROVIDERS: Shayne Anes, MD   LABS: Labs reviewed: Acceptable for surgery. (all labs ordered are listed, but only abnormal results are  displayed)  Labs Reviewed  SURGICAL PCR SCREEN  CBC  TYPE AND SCREEN     IMAGES:   EKG 12/18/23:  Normal sinus rhythm, rate 84 Low voltage QRS Nonspecific ST and T wave abnormality  CV:  Echo 12/31/23:  IMPRESSIONS    1. Left ventricular ejection fraction, by estimation, is 60 to 65%. The left ventricle has normal function. The left ventricle has no regional wall motion abnormalities. Left ventricular diastolic parameters are consistent with Grade I diastolic dysfunction (impaired relaxation).  2. Right ventricular systolic function is normal. The right ventricular size is normal.  3. The mitral valve is normal in structure. No evidence of mitral valve regurgitation. No evidence of mitral stenosis.  4. The aortic valve is normal in structure. There is mild calcification of the aortic valve. Aortic valve regurgitation is not visualized. Aortic valve sclerosis is present, with no evidence of aortic valve stenosis.  5. The inferior vena cava is normal in size with greater than 50% respiratory variability, suggesting right atrial pressure of 3 mmHg.  Past Medical History:  Diagnosis Date   Allergic rhinitis    Blood dyscrasia    Cancer (HCC)    Skin Cancer on Left Leg   Clotting disorder (HCC)    Factor 5   Factor 5 Leiden mutation, heterozygous (HCC)    Family history of adverse reaction to anesthesia    Daughter was paralyzed for several days after  1 surgery.   Heart murmur    mild mitral regurg-echo 2010   Hyperlipidemia    Hypertension    Lupus anticoagulant positive    Obesity    Pneumonia    In High School   Pre-diabetes    Primary localized osteoarthritis of left hip 03/30/2019   Stroke [facility]) 2010   Some right sided weakness.  Gait issues.   Wears dentures    partial upper    Past Surgical History:  Procedure Laterality Date   ABDOMINAL HYSTERECTOMY  10/15/1979   APPENDECTOMY     AXILLARY LYMPH NODE BIOPSY Left 05/16/2014   Procedure: EXCISION  2CM LEFT AXILLARY MASS;  Surgeon: Donnice Bury, MD;  Location: [facility];  Service: General;  Laterality: Left;  Left axillary sebaceous cyst excision   CATARACT EXTRACTION W/PHACO Left 04/11/2020   Procedure: CATARACT EXTRACTION PHACO AND INTRAOCULAR LENS PLACEMENT (IOC) LEFT DIABETIC TORIC LENS  6.05 00:34.9;  Surgeon: Jaye Fallow, MD;  Location: [facility];  Service: Ophthalmology;  Laterality: Left;   CATARACT EXTRACTION W/PHACO Right 12/12/2020   Procedure: CATARACT EXTRACTION PHACO AND INTRAOCULAR LENS PLACEMENT (IOC) RIGHT TORIC LENS;  Surgeon: Jaye Fallow, MD;  Location: [facility];  Service: Ophthalmology;  Laterality: Right;  6.55 0:41.1   DILATION AND CURETTAGE OF UTERUS     SKIN CANCER EXCISION Left 10/14/2014   Ankle   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   Procedure: TOTAL HIP ARTHROPLASTY;  Surgeon: Josefina Chew, MD;  Location: [facility];  Service: Orthopedics;  Laterality: Left;    MEDICATIONS:  acetaminophen  (TYLENOL ) 650 MG CR tablet   amLODipine  (NORVASC ) 10 MG tablet   B Complex Vitamins (B COMPLEX PO)   BIOTIN PO   cholecalciferol  (VITAMIN D ) 1000 UNITS tablet   denosumab  (PROLIA ) 60 MG/ML SOSY injection   escitalopram  (LEXAPRO ) 20 MG tablet   ezetimibe  (ZETIA ) 10 MG tablet   fexofenadine (ALLEGRA) 60 MG tablet   fluticasone  (FLONASE ) 50 MCG/ACT nasal spray   lisinopril  (ZESTRIL ) 20 MG tablet   Magnesium  250 MG TABS   simvastatin  (ZOCOR ) 20 MG tablet   vitamin B-12 (CYANOCOBALAMIN ) 1000 MCG tablet   warfarin (COUMADIN ) 5 MG tablet   No current facility-administered medications for this encounter.   Burnard CHRISTELLA Odis DEVONNA [facility] Short Stay/Anesthesiology [facility] Phone [phone] 04/08/2024 11:49 AM

## 2024-04-12 NOTE — H&P (Signed)
 HIP ARTHROPLASTY ADMISSION H&P  Patient ID: [name] HEADINGS MRN: [id number] DOB/AGE: 10/28/1945 78 y.o.  Chief Complaint: right hip pain.  Planned Procedure Date: 04/13/24 Medical Clearance by Dr. Shayne   Cardiac Clearance by Dr. Karlos   HPI: [name] is a 78 y.o. female who presents for evaluation of Primary osteoarthritis of one hip, righ. The patient has a history of pain and functional disability in the right hip due to arthritis and has failed non-surgical conservative treatments for greater than 12 weeks to include NSAID's and/or analgesics, use of assistive devices, and activity modification.  Onset of symptoms was gradual, starting 4 years ago with rapidlly worsening course since that time. The patient noted no past surgery on the right hip.  Patient currently rates pain at 10 out of 10 with activity. Patient has worsening of pain with activity and weight bearing, pain that interferes with activities of daily living, and pain with passive range of motion.  Patient has evidence of joint space narrowing by imaging studies.  There is no active infection.  Past Medical History:  Diagnosis Date   Allergic rhinitis    Blood dyscrasia    Cancer (HCC)    Skin Cancer on Left Leg   Clotting disorder (HCC)    Factor 5   Factor 5 Leiden mutation, heterozygous (HCC)    Family history of adverse reaction to anesthesia    Daughter was paralyzed for several days after 1 surgery.   Heart murmur    mild mitral regurg-echo 2010   Hyperlipidemia    Hypertension    Lupus anticoagulant positive    Obesity    Pneumonia    In High School   Pre-diabetes    Primary localized osteoarthritis of left hip 03/30/2019   Stroke [facility]) 2010   Some right sided weakness.  Gait issues.   Wears dentures    partial upper   Past Surgical History:  Procedure Laterality Date   ABDOMINAL HYSTERECTOMY  10/15/1979   APPENDECTOMY     AXILLARY LYMPH NODE BIOPSY Left 05/16/2014   Procedure: EXCISION  2CM LEFT AXILLARY MASS;  Surgeon: Donnice Bury, MD;  Location: [facility];  Service: General;  Laterality: Left;  Left axillary sebaceous cyst excision   CATARACT EXTRACTION W/PHACO Left 04/11/2020   Procedure: CATARACT EXTRACTION PHACO AND INTRAOCULAR LENS PLACEMENT (IOC) LEFT DIABETIC TORIC LENS  6.05 00:34.9;  Surgeon: Jaye Fallow, MD;  Location: [facility];  Service: Ophthalmology;  Laterality: Left;   CATARACT EXTRACTION W/PHACO Right 12/12/2020   Procedure: CATARACT EXTRACTION PHACO AND INTRAOCULAR LENS PLACEMENT (IOC) RIGHT TORIC LENS;  Surgeon: Jaye Fallow, MD;  Location: [facility];  Service: Ophthalmology;  Laterality: Right;  6.55 0:41.1   DILATION AND CURETTAGE OF UTERUS     SKIN CANCER EXCISION Left 10/14/2014   Ankle   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   Procedure: TOTAL HIP ARTHROPLASTY;  Surgeon: Josefina Chew, MD;  Location: [facility];  Service: Orthopedics;  Laterality: Left;   Allergies  Allergen Reactions   Baclofen  Other (See Comments)    felt muscles were weaker.   Codeine Other (See Comments)    hallucination   Duloxetine Hcl Other (See Comments)    Cymbalta*  could not tolerate.terrible shakes, breathing problems.heart race   Influenza Virus Vaccine Other (See Comments)    feverish red raised at site. Takes this every year    Nitrofurantoin  Other (See Comments)    headaches whole time on med, didnt feel well  Zoloft [Sertraline] Other (See Comments)     pt didnt' like how she felt.   Prior to Admission medications   Medication Sig Start Date End Date Taking? Authorizing Provider  acetaminophen  (TYLENOL ) 650 MG CR tablet Take 1,300 mg by mouth every 8 (eight) hours.   Yes [provider]  amLODipine  (NORVASC ) 10 MG tablet Take 5 mg by mouth 2 (two) times daily.   Yes [provider]  B Complex Vitamins (B COMPLEX PO) Take 1 tablet by mouth daily.    Yes [provider]  BIOTIN PO  Take 1 tablet by mouth daily.    Yes [provider]  cholecalciferol  (VITAMIN D ) 1000 UNITS tablet Take 1,000 Units by mouth daily.    Yes [provider]  denosumab  (PROLIA ) 60 MG/ML SOSY injection Inject 60 mg into the skin every 6 (six) months.   Yes [provider]  enoxaparin  (LOVENOX ) 80 MG/0.8ML injection Inject 80 mg into the skin in the morning and at bedtime.   Yes [provider]  escitalopram  (LEXAPRO ) 20 MG tablet Take 20 mg by mouth daily. 11/25/14  Yes [provider]  ezetimibe  (ZETIA ) 10 MG tablet Take 5 mg by mouth 3 (three) times a week.    Yes [provider]  fexofenadine (ALLEGRA) 60 MG tablet Take 60 mg by mouth daily.   Yes [provider]  fluticasone  (FLONASE ) 50 MCG/ACT nasal spray Place 2 sprays into both nostrils daily as needed for allergies.  07/24/17  Yes [provider]  lisinopril  (ZESTRIL ) 20 MG tablet Take 10 mg by mouth 2 (two) times daily. 08/22/17  Yes [provider]  Magnesium  250 MG TABS Take 250 mg by mouth daily.    Yes [provider]  simvastatin  (ZOCOR ) 20 MG tablet Take 20 mg by mouth at bedtime.    Yes [provider]  vitamin B-12 (CYANOCOBALAMIN ) 1000 MCG tablet Take 1,000 mcg by mouth daily.   Yes [provider]  warfarin (COUMADIN ) 5 MG tablet Take 2.5-5 mg by mouth See admin instructions. Take per instruction of Coumadin  Clinic Patient not taking: Reported on 04/09/2024    [provider]   Social History   Socioeconomic History   Marital status: Divorced    Spouse name: Not on file   Number of children: 2   Years of education: Not on file   Highest education level: Not on file  Occupational History   Occupation: Retired  Tobacco Use   Smoking status: Former    Current packs/day: 0.00    Types: Cigarettes    Quit date: 03/14/2009    Years since quitting: 15.0   Smokeless tobacco: Never  Vaping Use   Vaping status:  Never Used  Substance and Sexual Activity   Alcohol  use: No    Alcohol /week: 0.0 standard drinks of alcohol    Drug use: No   Sexual activity: Not Currently  Other Topics Concern   Not on file  Social History Narrative   Not on file   Social Drivers of Health   Financial Resource Strain: Not on file  Food Insecurity: Not on file  Transportation Needs: Not on file  Physical Activity: Not on file  Stress: Not on file  Social Connections: Not on file   Family History  Problem Relation Age of Onset   Heart disease Mother    COPD Father    Colon cancer Sister    Clotting disorder Sister    Diabetes Sister  Kidney disease Sister    Heart disease Brother     ROS: Currently denies lightheadedness, dizziness, Fever, chills, CP, SOB.   No personal history of DVT, PE, MI. Hx of CVA No loose teeth. + dentures All other systems have been reviewed and were otherwise currently negative with the exception of those mentioned in the HPI and as above.  Objective: Vitals: Ht: 5' 4 Wt: 192 lbs Temp: 98.9 BP: 120/64 Pulse: 94 O2 92% on room air.   Physical Exam: General: Alert, NAD. Trendelenberg Gait  HEENT: EOMI, Good Neck Extension  Pulm: No increased work of breathing.  Clear B/L A/P w/o crackle or wheeze.  CV: RRR, No m/g/r appreciated  GI: soft, NT, ND Neuro: Neuro without gross focal deficit.  Sensation intact distally Skin: No lesions in the area of chief complaint MSK/Surgical Site: right Hip pain with passive ROM.  Positive Stinchfield.  5/5 strength.  NVI.    Imaging Review Plain radiographs demonstrate severe degenerative joint disease of the right hip.   Preoperative templating of the joint replacement has been completed, documented, and submitted to the Operating Room personnel in order to optimize intra-operative equipment management.  Assessment: Primary osteoarthritis of one hip, right    Plan: Plan for Procedure(s): ARTHROPLASTY, HIP, TOTAL,POSTERIOR  APPROACH  The patient history, physical exam, clinical judgement of the provider and imaging are consistent with end stage degenerative joint disease and total joint arthroplasty is deemed medically necessary. The treatment options including medical management, injection therapy, and arthroplasty were discussed at length. The risks and benefits of Procedure(s): ARTHROPLASTY, HIP, TOTAL,POSTERIOR APPROACH were presented and reviewed.  The risks of nonoperative treatment, versus surgical intervention including but not limited to continued pain, aseptic loosening, stiffness, dislocation/subluxation, infection, bleeding, nerve injury, blood clots, cardiopulmonary complications, morbidity, mortality, among others were discussed. The patient verbalizes understanding and wishes to proceed with the plan.  Patient is being admitted for surgery, pain control, PT, prophylactic antibiotics, VTE prophylaxis, progressive ambulation, ADL's and discharge planning.    The patient does not meet the criteria for TXA which will be used perioperatively.   Baseline Coumadin   will be used postoperatively for DVT prophylaxis in addition to SCDs, and early ambulation. The patient is planning to be discharged home with HHPT in care of family   Shalaine Payson K Tanique Matney, DEVONNA 04/12/2024 1:01 PM

## 2024-04-13 ENCOUNTER — Inpatient Hospital Stay (HOSPITAL_COMMUNITY)
Admission: RE | Admit: 2024-04-13 | Discharge: 2024-04-20 | DRG: 470 | Disposition: A | Discharge status: Home Health | Attending: Orthopedic Surgery | Admitting: Orthopedic Surgery

## 2024-04-13 ENCOUNTER — Encounter (HOSPITAL_COMMUNITY)
Admission: RE | Disposition: A | Discharge status: Home Health | Payer: Self-pay | Source: Home / Self Care | Attending: Orthopedic Surgery

## 2024-04-13 ENCOUNTER — Ambulatory Visit (HOSPITAL_COMMUNITY): Payer: Self-pay | Admitting: Medical

## 2024-04-13 ENCOUNTER — Ambulatory Visit (HOSPITAL_COMMUNITY)

## 2024-04-13 ENCOUNTER — Encounter (HOSPITAL_COMMUNITY): Payer: Self-pay | Admitting: Orthopedic Surgery

## 2024-04-13 ENCOUNTER — Other Ambulatory Visit: Payer: Self-pay

## 2024-04-13 DIAGNOSIS — Z8249 Family history of ischemic heart disease and other diseases of the circulatory system: Secondary | ICD-10-CM

## 2024-04-13 DIAGNOSIS — Z881 Allergy status to other antibiotic agents status: Secondary | ICD-10-CM

## 2024-04-13 DIAGNOSIS — E119 Type 2 diabetes mellitus without complications: Secondary | ICD-10-CM | POA: Diagnosis not present

## 2024-04-13 DIAGNOSIS — R609 Edema, unspecified: Secondary | ICD-10-CM | POA: Diagnosis not present

## 2024-04-13 DIAGNOSIS — Z87891 Personal history of nicotine dependence: Secondary | ICD-10-CM

## 2024-04-13 DIAGNOSIS — Z885 Allergy status to narcotic agent status: Secondary | ICD-10-CM

## 2024-04-13 DIAGNOSIS — Z96642 Presence of left artificial hip joint: Secondary | ICD-10-CM | POA: Diagnosis present

## 2024-04-13 DIAGNOSIS — D6851 Activated protein C resistance: Secondary | ICD-10-CM | POA: Diagnosis present

## 2024-04-13 DIAGNOSIS — N390 Urinary tract infection, site not specified: Secondary | ICD-10-CM | POA: Diagnosis present

## 2024-04-13 DIAGNOSIS — Z79899 Other long term (current) drug therapy: Secondary | ICD-10-CM

## 2024-04-13 DIAGNOSIS — Z833 Family history of diabetes mellitus: Secondary | ICD-10-CM

## 2024-04-13 DIAGNOSIS — Z96643 Presence of artificial hip joint, bilateral: Secondary | ICD-10-CM | POA: Diagnosis not present

## 2024-04-13 DIAGNOSIS — Z841 Family history of disorders of kidney and ureter: Secondary | ICD-10-CM | POA: Diagnosis not present

## 2024-04-13 DIAGNOSIS — I10 Essential (primary) hypertension: Secondary | ICD-10-CM | POA: Diagnosis present

## 2024-04-13 DIAGNOSIS — Z832 Family history of diseases of the blood and blood-forming organs and certain disorders involving the immune mechanism: Secondary | ICD-10-CM

## 2024-04-13 DIAGNOSIS — Z825 Family history of asthma and other chronic lower respiratory diseases: Secondary | ICD-10-CM | POA: Diagnosis not present

## 2024-04-13 DIAGNOSIS — I69351 Hemiplegia and hemiparesis following cerebral infarction affecting right dominant side: Secondary | ICD-10-CM | POA: Diagnosis not present

## 2024-04-13 DIAGNOSIS — R7303 Prediabetes: Secondary | ICD-10-CM | POA: Diagnosis present

## 2024-04-13 DIAGNOSIS — Z800 Family history of malignant neoplasm of digestive organs: Secondary | ICD-10-CM | POA: Diagnosis not present

## 2024-04-13 DIAGNOSIS — M1611 Unilateral primary osteoarthritis, right hip: Secondary | ICD-10-CM

## 2024-04-13 DIAGNOSIS — Z01818 Encounter for other preprocedural examination: Principal | ICD-10-CM

## 2024-04-13 DIAGNOSIS — Z96641 Presence of right artificial hip joint: Secondary | ICD-10-CM | POA: Diagnosis present

## 2024-04-13 DIAGNOSIS — Z7901 Long term (current) use of anticoagulants: Secondary | ICD-10-CM | POA: Diagnosis not present

## 2024-04-13 DIAGNOSIS — Z85828 Personal history of other malignant neoplasm of skin: Secondary | ICD-10-CM | POA: Diagnosis not present

## 2024-04-13 DIAGNOSIS — R197 Diarrhea, unspecified: Secondary | ICD-10-CM | POA: Diagnosis not present

## 2024-04-13 DIAGNOSIS — E785 Hyperlipidemia, unspecified: Secondary | ICD-10-CM | POA: Diagnosis present

## 2024-04-13 DIAGNOSIS — Z888 Allergy status to other drugs, medicaments and biological substances status: Secondary | ICD-10-CM | POA: Diagnosis not present

## 2024-04-13 DIAGNOSIS — Z90710 Acquired absence of both cervix and uterus: Secondary | ICD-10-CM

## 2024-04-13 DIAGNOSIS — Z887 Allergy status to serum and vaccine status: Secondary | ICD-10-CM

## 2024-04-13 HISTORY — PX: TOTAL HIP ARTHROPLASTY: SHX124

## 2024-04-13 LAB — BASIC METABOLIC PANEL WITH GFR
Anion gap: 12 (ref 5–15)
BUN: 16 mg/dL (ref 8–23)
CO2: 22 mmol/L (ref 22–32)
Calcium: 9.2 mg/dL (ref 8.9–10.3)
Chloride: 106 mmol/L (ref 98–111)
Creatinine, Ser: 0.71 mg/dL (ref 0.44–1.00)
GFR, Estimated: >60 mL/min (ref >60)
Glucose, Bld: 120 mg/dL — ABNORMAL HIGH (ref 70–99)
Potassium: 3.7 mmol/L (ref 3.5–5.1)
Sodium: 140 mmol/L (ref 135–145)

## 2024-04-13 LAB — CBC
HCT: 41.8 % (ref 36.0–46.0)
Hemoglobin: 13.5 g/dL (ref 12.0–15.0)
MCH: 31.9 pg (ref 26.0–34.0)
MCHC: 32.3 g/dL (ref 30.0–36.0)
MCV: 98.8 fL (ref 80.0–100.0)
Platelets: 244 10*3/uL (ref 150–400)
RBC: 4.23 MIL/uL (ref 3.87–5.11)
RDW: 13.2 % (ref 11.5–15.5)
WBC: 15.8 10*3/uL — ABNORMAL HIGH (ref 4.0–10.5)
nRBC: 0 % (ref 0.0–0.2)

## 2024-04-13 LAB — PROTIME-INR
INR: 1 (ref 0.8–1.2)
Prothrombin Time: 13.4 s (ref 11.4–15.2)

## 2024-04-13 LAB — GLUCOSE, CAPILLARY: Glucose-Capillary: 148 mg/dL — ABNORMAL HIGH (ref 70–99)

## 2024-04-13 LAB — APTT: aPTT: 31 s (ref 24–36)

## 2024-04-13 LAB — CREATININE, SERUM
Creatinine, Ser: 0.74 mg/dL (ref 0.44–1.00)
GFR, Estimated: >60 mL/min (ref >60)

## 2024-04-13 SURGERY — ARTHROPLASTY, HIP, TOTAL,POSTERIOR APPROACH
Anesthesia: General | Site: Hip | Laterality: Right

## 2024-04-13 MED ORDER — CHLORHEXIDINE GLUCONATE 0.12 % MT SOLN: 15.0000 mL | Freq: Once | OROMUCOSAL | Status: DC

## 2024-04-13 MED ORDER — PHENOL 1.4 % MT LIQD: 1.0000 | OROMUCOSAL | Status: DC | PRN

## 2024-04-13 MED ORDER — DIPHENHYDRAMINE HCL 12.5 MG/5ML PO ELIX
12.5000 mg | ORAL_SOLUTION | ORAL | Status: DC | PRN
  Administered 2024-04-14 – 2024-04-17 (×2): 25 mg via ORAL
  Filled 2024-04-13 (×2): qty 10

## 2024-04-13 MED ORDER — MIDAZOLAM HCL 2 MG/2ML IJ SOLN
INTRAMUSCULAR | Status: AC
  Filled 2024-04-13: qty 2

## 2024-04-13 MED ORDER — BISACODYL 10 MG RE SUPP: 10.0000 mg | Freq: Every day | RECTAL | Status: DC | PRN

## 2024-04-13 MED ORDER — MAGNESIUM CITRATE PO SOLN
1.0000 | Freq: Once | ORAL | Status: DC | PRN
  Filled 2024-04-13: qty 296

## 2024-04-13 MED ORDER — ESCITALOPRAM OXALATE 10 MG PO TABS
20.0000 mg | ORAL_TABLET | Freq: Every day | ORAL | Status: DC
  Administered 2024-04-14 – 2024-04-20 (×7): 20 mg via ORAL
  Filled 2024-04-13 (×7): qty 2

## 2024-04-13 MED ORDER — TRANEXAMIC ACID-NACL 1000-0.7 MG/100ML-% IV SOLN
1000.0000 mg | Freq: Once | INTRAVENOUS | Status: AC
  Administered 2024-04-13: 1000 mg via INTRAVENOUS
  Filled 2024-04-13: qty 100

## 2024-04-13 MED ORDER — 0.9 % SODIUM CHLORIDE (POUR BTL) OPTIME
TOPICAL | Status: DC | PRN
  Administered 2024-04-13: 1000 mL

## 2024-04-13 MED ORDER — CHLORHEXIDINE GLUCONATE 0.12 % MT SOLN
OROMUCOSAL | Status: AC
  Administered 2024-04-13: 15 mL via OROMUCOSAL
  Filled 2024-04-13: qty 15

## 2024-04-13 MED ORDER — CHLORHEXIDINE GLUCONATE 0.12 % MT SOLN: 15.0000 mL | Freq: Once | OROMUCOSAL | Status: AC

## 2024-04-13 MED ORDER — ACETAMINOPHEN 500 MG PO TABS
500.0000 mg | ORAL_TABLET | Freq: Four times a day (QID) | ORAL | Status: AC
  Administered 2024-04-14: 500 mg via ORAL
  Filled 2024-04-13: qty 1

## 2024-04-13 MED ORDER — ENOXAPARIN SODIUM 40 MG/0.4ML IJ SOSY: 40.0000 mg | PREFILLED_SYRINGE | Freq: Two times a day (BID) | INTRAMUSCULAR | Status: DC

## 2024-04-13 MED ORDER — HYDROMORPHONE HCL 1 MG/ML IJ SOLN
0.2500 mg | INTRAMUSCULAR | Status: DC | PRN
  Administered 2024-04-13 (×4): 0.25 mg via INTRAVENOUS

## 2024-04-13 MED ORDER — PROPOFOL 10 MG/ML IV BOLUS
INTRAVENOUS | Status: DC | PRN
  Administered 2024-04-13: 120 mg via INTRAVENOUS

## 2024-04-13 MED ORDER — ROCURONIUM BROMIDE 10 MG/ML (PF) SYRINGE
PREFILLED_SYRINGE | INTRAVENOUS | Status: DC | PRN
  Administered 2024-04-13: 20 mg via INTRAVENOUS
  Administered 2024-04-13: 80 mg via INTRAVENOUS
  Administered 2024-04-13: 10 mg via INTRAVENOUS

## 2024-04-13 MED ORDER — ORAL CARE MOUTH RINSE: 15.0000 mL | Freq: Once | OROMUCOSAL | Status: AC

## 2024-04-13 MED ORDER — POVIDONE-IODINE 10 % EX SWAB: 2.0000 | Freq: Once | CUTANEOUS | Status: DC

## 2024-04-13 MED ORDER — LACTATED RINGERS IV SOLN: INTRAVENOUS | Status: DC

## 2024-04-13 MED ORDER — ONDANSETRON HCL 4 MG/2ML IJ SOLN: 4.0000 mg | Freq: Four times a day (QID) | INTRAMUSCULAR | Status: DC | PRN

## 2024-04-13 MED ORDER — PROPOFOL 10 MG/ML IV BOLUS
INTRAVENOUS | Status: AC
  Filled 2024-04-13: qty 20

## 2024-04-13 MED ORDER — DEXAMETHASONE SODIUM PHOSPHATE 10 MG/ML IJ SOLN
INTRAMUSCULAR | Status: DC | PRN
  Administered 2024-04-13: 5 mg via INTRAVENOUS

## 2024-04-13 MED ORDER — HYDROMORPHONE HCL 1 MG/ML IJ SOLN
INTRAMUSCULAR | Status: AC
  Filled 2024-04-13: qty 0.5

## 2024-04-13 MED ORDER — WARFARIN - PHARMACIST DOSING INPATIENT: Freq: Every day | Status: DC

## 2024-04-13 MED ORDER — SIMVASTATIN 20 MG PO TABS
20.0000 mg | ORAL_TABLET | Freq: Every day | ORAL | Status: DC
  Administered 2024-04-13 – 2024-04-19 (×7): 20 mg via ORAL
  Filled 2024-04-13 (×7): qty 1

## 2024-04-13 MED ORDER — POVIDONE-IODINE 7.5 % EX SOLN
Freq: Once | CUTANEOUS | Status: DC
  Filled 2024-04-13: qty 118

## 2024-04-13 MED ORDER — DOCUSATE SODIUM 100 MG PO CAPS
100.0000 mg | ORAL_CAPSULE | Freq: Two times a day (BID) | ORAL | Status: DC
  Administered 2024-04-14 – 2024-04-20 (×10): 100 mg via ORAL
  Filled 2024-04-13 (×14): qty 1

## 2024-04-13 MED ORDER — CEFAZOLIN SODIUM-DEXTROSE 2-4 GM/100ML-% IV SOLN
2.0000 g | Freq: Four times a day (QID) | INTRAVENOUS | Status: AC
  Administered 2024-04-13 – 2024-04-14 (×2): 2 g via INTRAVENOUS
  Filled 2024-04-13 (×2): qty 100

## 2024-04-13 MED ORDER — HYDROMORPHONE HCL 1 MG/ML IJ SOLN
INTRAMUSCULAR | Status: DC | PRN
  Administered 2024-04-13 (×2): .25 mg via INTRAVENOUS

## 2024-04-13 MED ORDER — LISINOPRIL 10 MG PO TABS
10.0000 mg | ORAL_TABLET | Freq: Two times a day (BID) | ORAL | Status: DC
  Administered 2024-04-14 – 2024-04-20 (×13): 10 mg via ORAL
  Filled 2024-04-13 (×13): qty 1

## 2024-04-13 MED ORDER — ONDANSETRON HCL 4 MG/2ML IJ SOLN
INTRAMUSCULAR | Status: DC | PRN
  Administered 2024-04-13: 4 mg via INTRAVENOUS

## 2024-04-13 MED ORDER — ORAL CARE MOUTH RINSE: 15.0000 mL | Freq: Once | OROMUCOSAL | Status: DC

## 2024-04-13 MED ORDER — WARFARIN SODIUM 2.5 MG PO TABS
2.5000 mg | ORAL_TABLET | ORAL | Status: DC
  Administered 2024-04-15: 2.5 mg via ORAL
  Filled 2024-04-13 (×2): qty 1

## 2024-04-13 MED ORDER — DEXAMETHASONE SODIUM PHOSPHATE 10 MG/ML IJ SOLN
INTRAMUSCULAR | Status: AC
  Filled 2024-04-13: qty 1

## 2024-04-13 MED ORDER — ACETAMINOPHEN 500 MG PO TABS
ORAL_TABLET | ORAL | Status: AC
  Filled 2024-04-13: qty 2

## 2024-04-13 MED ORDER — OXYCODONE HCL 5 MG/5ML PO SOLN: 5.0000 mg | Freq: Once | ORAL | Status: AC | PRN

## 2024-04-13 MED ORDER — FENTANYL CITRATE (PF) 250 MCG/5ML IJ SOLN
INTRAMUSCULAR | Status: DC | PRN
  Administered 2024-04-13: 50 ug via INTRAVENOUS
  Administered 2024-04-13: 25 ug via INTRAVENOUS

## 2024-04-13 MED ORDER — OXYCODONE HCL 5 MG PO TABS
ORAL_TABLET | ORAL | Status: AC
  Filled 2024-04-13: qty 1

## 2024-04-13 MED ORDER — WARFARIN SODIUM 5 MG PO TABS
5.0000 mg | ORAL_TABLET | ORAL | Status: DC
  Administered 2024-04-14 – 2024-04-16 (×2): 5 mg via ORAL
  Filled 2024-04-13 (×2): qty 1

## 2024-04-13 MED ORDER — AMISULPRIDE (ANTIEMETIC) 5 MG/2ML IV SOLN: 10.0000 mg | Freq: Once | INTRAVENOUS | Status: DC | PRN

## 2024-04-13 MED ORDER — AMLODIPINE BESYLATE 5 MG PO TABS
5.0000 mg | ORAL_TABLET | Freq: Two times a day (BID) | ORAL | Status: DC
  Administered 2024-04-14 – 2024-04-20 (×13): 5 mg via ORAL
  Filled 2024-04-13 (×13): qty 1

## 2024-04-13 MED ORDER — CEFAZOLIN SODIUM-DEXTROSE 2-4 GM/100ML-% IV SOLN
INTRAVENOUS | Status: AC
  Filled 2024-04-13: qty 100

## 2024-04-13 MED ORDER — SUGAMMADEX SODIUM 200 MG/2ML IV SOLN
INTRAVENOUS | Status: DC | PRN
  Administered 2024-04-13: 200 mg via INTRAVENOUS

## 2024-04-13 MED ORDER — PHENYLEPHRINE 80 MCG/ML (10ML) SYRINGE FOR IV PUSH (FOR BLOOD PRESSURE SUPPORT)
PREFILLED_SYRINGE | INTRAVENOUS | Status: AC
  Filled 2024-04-13: qty 10

## 2024-04-13 MED ORDER — ACETAMINOPHEN 500 MG PO TABS: 1000.0000 mg | ORAL_TABLET | Freq: Once | ORAL | Status: DC

## 2024-04-13 MED ORDER — METOCLOPRAMIDE HCL 5 MG/ML IJ SOLN: 5.0000 mg | Freq: Three times a day (TID) | INTRAMUSCULAR | Status: DC | PRN

## 2024-04-13 MED ORDER — EZETIMIBE 10 MG PO TABS
5.0000 mg | ORAL_TABLET | ORAL | Status: DC
  Administered 2024-04-14 – 2024-04-19 (×3): 5 mg via ORAL
  Filled 2024-04-13 (×4): qty 1

## 2024-04-13 MED ORDER — POTASSIUM CHLORIDE IN NACL 20-0.9 MEQ/L-% IV SOLN
INTRAVENOUS | Status: AC
  Filled 2024-04-13: qty 1000

## 2024-04-13 MED ORDER — MIDAZOLAM HCL 2 MG/2ML IJ SOLN
INTRAMUSCULAR | Status: DC | PRN
  Administered 2024-04-13: 1 mg via INTRAVENOUS

## 2024-04-13 MED ORDER — HYDROCODONE-ACETAMINOPHEN 5-325 MG PO TABS
1.0000 | ORAL_TABLET | ORAL | Status: DC | PRN
  Administered 2024-04-13 – 2024-04-14 (×2): 2 via ORAL
  Filled 2024-04-13 (×2): qty 2

## 2024-04-13 MED ORDER — OXYCODONE HCL 5 MG PO TABS
5.0000 mg | ORAL_TABLET | Freq: Once | ORAL | Status: AC | PRN
  Administered 2024-04-13: 5 mg via ORAL

## 2024-04-13 MED ORDER — HYDROMORPHONE HCL 1 MG/ML IJ SOLN
INTRAMUSCULAR | Status: AC
  Filled 2024-04-13: qty 1

## 2024-04-13 MED ORDER — POVIDONE-IODINE 10 % EX SWAB
2.0000 | Freq: Once | CUTANEOUS | Status: AC
  Administered 2024-04-13: 2 via TOPICAL

## 2024-04-13 MED ORDER — LIDOCAINE 2% (20 MG/ML) 5 ML SYRINGE
INTRAMUSCULAR | Status: DC | PRN
  Administered 2024-04-13: 80 mg via INTRAVENOUS

## 2024-04-13 MED ORDER — MORPHINE SULFATE (PF) 2 MG/ML IV SOLN: 0.5000 mg | INTRAVENOUS | Status: DC | PRN

## 2024-04-13 MED ORDER — TRANEXAMIC ACID-NACL 1000-0.7 MG/100ML-% IV SOLN
INTRAVENOUS | Status: AC
  Filled 2024-04-13: qty 100

## 2024-04-13 MED ORDER — METOCLOPRAMIDE HCL 5 MG PO TABS: 5.0000 mg | ORAL_TABLET | Freq: Three times a day (TID) | ORAL | Status: DC | PRN

## 2024-04-13 MED ORDER — ENOXAPARIN SODIUM 80 MG/0.8ML IJ SOSY
80.0000 mg | PREFILLED_SYRINGE | Freq: Two times a day (BID) | INTRAMUSCULAR | Status: DC
  Administered 2024-04-14 – 2024-04-20 (×13): 80 mg via SUBCUTANEOUS
  Filled 2024-04-13 (×16): qty 0.8

## 2024-04-13 MED ORDER — CEFAZOLIN SODIUM-DEXTROSE 2-4 GM/100ML-% IV SOLN
2.0000 g | INTRAVENOUS | Status: AC
  Administered 2024-04-13: 2 g via INTRAVENOUS

## 2024-04-13 MED ORDER — ONDANSETRON HCL 4 MG PO TABS: 4.0000 mg | ORAL_TABLET | Freq: Four times a day (QID) | ORAL | Status: DC | PRN

## 2024-04-13 MED ORDER — FENTANYL CITRATE (PF) 250 MCG/5ML IJ SOLN
INTRAMUSCULAR | Status: AC
Start: 2024-04-13 — End: 2024-04-13
  Filled 2024-04-13: qty 5

## 2024-04-13 MED ORDER — POLYETHYLENE GLYCOL 3350 17 G PO PACK: 17.0000 g | PACK | Freq: Every day | ORAL | Status: DC | PRN

## 2024-04-13 MED ORDER — HYDROCODONE-ACETAMINOPHEN 7.5-325 MG PO TABS
1.0000 | ORAL_TABLET | ORAL | Status: DC | PRN
  Administered 2024-04-14: 2 via ORAL
  Filled 2024-04-13: qty 2

## 2024-04-13 MED ORDER — ACETAMINOPHEN 325 MG PO TABS
325.0000 mg | ORAL_TABLET | Freq: Four times a day (QID) | ORAL | Status: DC | PRN
  Administered 2024-04-15 – 2024-04-18 (×3): 650 mg via ORAL
  Filled 2024-04-13 (×3): qty 2

## 2024-04-13 MED ORDER — SODIUM CHLORIDE 0.9 % IV SOLN: 12.5000 mg | INTRAVENOUS | Status: DC | PRN

## 2024-04-13 MED ORDER — MENTHOL 3 MG MT LOZG: 1.0000 | LOZENGE | OROMUCOSAL | Status: DC | PRN

## 2024-04-13 MED ORDER — BUPIVACAINE HCL (PF) 0.25 % IJ SOLN
INTRAMUSCULAR | Status: AC
  Filled 2024-04-13: qty 30

## 2024-04-13 MED ORDER — ONDANSETRON HCL 4 MG/2ML IJ SOLN
INTRAMUSCULAR | Status: AC
  Filled 2024-04-13: qty 2

## 2024-04-13 MED ORDER — ALUM & MAG HYDROXIDE-SIMETH 200-200-20 MG/5ML PO SUSP
30.0000 mL | ORAL | Status: DC | PRN
  Administered 2024-04-18: 30 mL via ORAL
  Filled 2024-04-13 (×2): qty 30

## 2024-04-13 MED ORDER — TRANEXAMIC ACID-NACL 1000-0.7 MG/100ML-% IV SOLN
1000.0000 mg | INTRAVENOUS | Status: AC
  Administered 2024-04-13: 1000 mg via INTRAVENOUS

## 2024-04-13 SURGICAL SUPPLY — 52 items
BAG COUNTER SPONGE SURGICOUNT (BAG) ×1 IMPLANT
BIT DRILL 5/64X5 DISP (BIT) ×1 IMPLANT
BLADE SAW SGTL 73X25 THK (BLADE) ×1 IMPLANT
CLSR STERI-STRIP ANTIMIC 1/2X4 (GAUZE/BANDAGES/DRESSINGS) ×2 IMPLANT
COVER SURGICAL LIGHT HANDLE (MISCELLANEOUS) ×1 IMPLANT
DERMABOND ADVANCED .7 DNX12 (GAUZE/BANDAGES/DRESSINGS) IMPLANT
DRAPE INCISE IOBAN 66X45 STRL (DRAPES) IMPLANT
DRAPE SURG ORHT 6 SPLT 77X108 (DRAPES) ×2 IMPLANT
DRAPE U-SHAPE 47X51 STRL (DRAPES) ×1 IMPLANT
DRSG MEPILEX POST OP 4X12 (GAUZE/BANDAGES/DRESSINGS) IMPLANT
DRSG MEPILEX POST OP 4X8 (GAUZE/BANDAGES/DRESSINGS) IMPLANT
DURAPREP 26ML APPLICATOR (WOUND CARE) ×1 IMPLANT
ELECT CAUTERY BLADE 6.4 (BLADE) ×1 IMPLANT
ELECTRODE REM PT RTRN 9FT ADLT (ELECTROSURGICAL) ×1 IMPLANT
ELIMINATOR HOLE APEX DEPUY (Hips) IMPLANT
GLOVE BIO SURGEON STRL SZ7 (GLOVE) ×2 IMPLANT
GLOVE BIOGEL PI IND STRL 7.0 (GLOVE) ×1 IMPLANT
GLOVE ORTHO TXT STRL SZ7.5 (GLOVE) ×1 IMPLANT
GOWN STRL REUS W/ TWL LRG LVL3 (GOWN DISPOSABLE) ×1 IMPLANT
GOWN STRL REUS W/ TWL XL LVL3 (GOWN DISPOSABLE) ×1 IMPLANT
HEAD M SROM 36MM PLUS 1.5 (Hips) IMPLANT
HOOD PEEL AWAY T7 (MISCELLANEOUS) ×1 IMPLANT
HOOD W/PEELAWAY (MISCELLANEOUS) ×1 IMPLANT
KIT BASIN OR (CUSTOM PROCEDURE TRAY) ×1 IMPLANT
KIT TURNOVER KIT B (KITS) ×1 IMPLANT
LINER NEUTRAL 52X36MM PLUS 4 (Liner) IMPLANT
MANIFOLD NEPTUNE II (INSTRUMENTS) ×1 IMPLANT
NDL 18GX1X1/2 (RX/OR ONLY) (NEEDLE) ×1 IMPLANT
NEEDLE 18GX1X1/2 (RX/OR ONLY) (NEEDLE) ×1 IMPLANT
NS IRRIG 1000ML POUR BTL (IV SOLUTION) ×1 IMPLANT
PACK TOTAL JOINT (CUSTOM PROCEDURE TRAY) ×1 IMPLANT
PAD ARMBOARD POSITIONER FOAM (MISCELLANEOUS) ×2 IMPLANT
PIN SECTOR W/GRIP ACE CUP 52MM (Hips) IMPLANT
PRESSURIZER FEMORAL UNIV (MISCELLANEOUS) IMPLANT
RETRIEVER SUT HEWSON (MISCELLANEOUS) ×1 IMPLANT
SCREW 6.5MMX25MM (Screw) IMPLANT
STEM FEMORAL SZ 5MM STD ACTIS (Stem) IMPLANT
SUCTION TUBE FRAZIER 10FR DISP (SUCTIONS) ×1 IMPLANT
SUT ETHIBOND 5 (SUTURE) ×1 IMPLANT
SUT ETHIBOND NAB CT1 #1 30IN (SUTURE) IMPLANT
SUT STRATAFIX 1PDS 45CM VIOLET (SUTURE) ×1 IMPLANT
SUT VIC AB 0 CT1 27XBRD ANBCTR (SUTURE) ×1 IMPLANT
SUT VIC AB 0 CT1 36 (SUTURE) IMPLANT
SUT VIC AB 2-0 CT1 TAPERPNT 27 (SUTURE) ×1 IMPLANT
SUT VIC AB 3-0 CT1 TAPERPNT 27 (SUTURE) IMPLANT
SUT VIC AB 3-0 SH 8-18 (SUTURE) ×1 IMPLANT
SUTURE FIBERWR#2 38 REV NDL BL (SUTURE) IMPLANT
SYR CONTROL 10ML LL (SYRINGE) ×1 IMPLANT
TOWEL GREEN STERILE (TOWEL DISPOSABLE) ×1 IMPLANT
TOWEL GREEN STERILE FF (TOWEL DISPOSABLE) ×1 IMPLANT
TRAY CATH INTERMITTENT SS 16FR (CATHETERS) IMPLANT
TRAY FOLEY W/BAG SLVR 14FR (SET/KITS/TRAYS/PACK) IMPLANT

## 2024-04-13 NOTE — Progress Notes (Addendum)
 PHARMACY - ANTICOAGULATION CONSULT NOTE  Pharmacy Consult for restart home coumadin  dosing Indication: FVL LT coumadin  use (2010 following stroke)  Allergies  Allergen Reactions   Baclofen  Other (See Comments)    felt muscles were weaker.   Codeine Other (See Comments)    hallucination   Duloxetine Hcl Other (See Comments)    Cymbalta*  could not tolerate.terrible shakes, breathing problems.heart race   Influenza Virus Vaccine Other (See Comments)    feverish red raised at site. Takes this every year    Nitrofurantoin  Other (See Comments)    headaches whole time on med, didnt feel well   Zoloft [Sertraline] Other (See Comments)     pt didnt' like how she felt.    Patient Measurements: Height: 5' 4 (162.6 cm) Weight: 86.2 kg (190 lb) IBW/kg (Calculated) : 54.7 HEPARIN DW (KG): 73.7  Vital Signs: Temp: 98.9 F (37.2 C) (07/01 1553) Temp Source: Oral (07/01 1039) BP: 132/81 (07/01 1553) Pulse Rate: 74 (07/01 1553)  Labs: Recent Labs    04/13/24 1033  APTT 31  LABPROT 13.4  INR 1.0  CREATININE 0.71    Estimated Creatinine Clearance: 61.6 mL/min (by C-G formula based on SCr of 0.71 mg/dL).   Medical History: Past Medical History:  Diagnosis Date   Allergic rhinitis    Blood dyscrasia    Cancer (HCC)    Skin Cancer on Left Leg   Clotting disorder (HCC)    Factor 5   Factor 5 Leiden mutation, heterozygous (HCC)    Family history of adverse reaction to anesthesia    Daughter was paralyzed for several days after 1 surgery.   Heart murmur    mild mitral regurg-echo 2010   Hyperlipidemia    Hypertension    Lupus anticoagulant positive    Obesity    Pneumonia    In High School   Pre-diabetes    Primary localized osteoarthritis of left hip 03/30/2019   Stroke [facility]) 2010   Some right sided weakness.  Gait issues.   Wears dentures    partial upper    Medications:  Medications Prior to Admission  Medication Sig Dispense Refill Last Dose/Taking    acetaminophen  (TYLENOL ) 650 MG CR tablet Take 1,300 mg by mouth every 8 (eight) hours.   04/13/2024 at  9:30 AM   amLODipine  (NORVASC ) 10 MG tablet Take 5 mg by mouth 2 (two) times daily.   04/12/2024   B Complex Vitamins (B COMPLEX PO) Take 1 tablet by mouth daily.    Past Month   BIOTIN PO Take 1 tablet by mouth daily.    Past Month   cholecalciferol  (VITAMIN D ) 1000 UNITS tablet Take 1,000 Units by mouth daily.    Past Month   enoxaparin  (LOVENOX ) 80 MG/0.8ML injection Inject 80 mg into the skin in the morning and at bedtime.   04/12/2024   escitalopram  (LEXAPRO ) 20 MG tablet Take 20 mg by mouth daily.  11 04/13/2024 at  9:30 AM   ezetimibe  (ZETIA ) 10 MG tablet Take 5 mg by mouth 3 (three) times a week.    04/12/2024   fexofenadine (ALLEGRA) 60 MG tablet Take 60 mg by mouth daily.   Past Week   fluticasone  (FLONASE ) 50 MCG/ACT nasal spray Place 2 sprays into both nostrils daily as needed for allergies.   1 Past Week   lisinopril  (ZESTRIL ) 20 MG tablet Take 10 mg by mouth 2 (two) times daily.  2 04/12/2024   Magnesium  250 MG TABS Take 250 mg by  mouth daily.    Past Week   simvastatin  (ZOCOR ) 20 MG tablet Take 20 mg by mouth at bedtime.    04/12/2024   vitamin B-12 (CYANOCOBALAMIN ) 1000 MCG tablet Take 1,000 mcg by mouth daily.   Past Week   denosumab  (PROLIA ) 60 MG/ML SOSY injection Inject 60 mg into the skin every 6 (six) months.   More than a month   [EXPIRED] enoxaparin  (LOVENOX ) 80 MG/0.8ML injection Inject 80 mg into the skin every 12 (twelve) hours.      warfarin (COUMADIN ) 5 MG tablet Take 2.5-5 mg by mouth See admin instructions. Take per instruction of Coumadin  Clinic   04/05/2024    Assessment: 12 YOF presents for a R-THR. Restart hoime coumadin  dosing. 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 Per note from 03/2024 Start enoxaparin  80mg  every 12 hours on 04/06/24  On 04/14/24 restart warfarin 2.5mg  QD except 5mg  MWF and enoxaparin  80mg  every 12 hours. Overlap for at least 5  days. Recheck INR 04/19/24. If INR <2, continue enoxaparin  until INR >2   Goal of Therapy:  INR 2-3 Monitor platelets by anticoagulation protocol: Yes   Plan:  Restart home coumadin  dose (2.5 mg daily on Sunday, Tuesday, Thursday, and Saturday / 5 mg on Monday, Wednesday, and Friday) tonight Lovenox  80 mg [place] q12h until INR >2. Start 7/2 0800   Benedetta Heath BS, PharmD, BCPS Clinical Pharmacist 04/13/2024 4:18 PM  Contact: [phone] after 3 PM  Be curious, not judgmental... -Davina Sprinkles

## 2024-04-13 NOTE — Anesthesia Preprocedure Evaluation (Signed)
 Anesthesia Evaluation  Patient identified by MRN, date of birth, ID band Patient awake    Reviewed: Allergy & Precautions, NPO status , Patient's Chart, lab work & pertinent test results  Airway Mallampati: II  TM Distance: >3 FB Neck ROM: Full    Dental no notable dental hx. (+) Partial Upper, Partial Lower   Pulmonary former smoker   Pulmonary exam normal breath sounds clear to auscultation       Cardiovascular hypertension, Pt. on medications Normal cardiovascular exam Rhythm:Regular Rate:Normal     Neuro/Psych CVA, Residual Symptoms  negative psych ROS   GI/Hepatic negative GI ROS, Neg liver ROS,,,  Endo/Other  diabetes, Type 2    Renal/GU negative Renal ROS  negative genitourinary   Musculoskeletal  (+) Arthritis , Osteoarthritis,    Abdominal  (+) + obese  Peds negative pediatric ROS (+)  Hematology negative hematology ROS (+) Blood dyscrasia (Factor 5 Leiden)   Anesthesia Other Findings   Reproductive/Obstetrics negative OB ROS                             Anesthesia Physical Anesthesia Plan  ASA: III  Anesthesia Plan: General   Post-op Pain Management:    Induction: Intravenous  PONV Risk Score and Plan: 3 and Ondansetron , Treatment may vary due to age or medical condition, Dexamethasone  and Midazolam   Airway Management Planned: Oral ETT  Additional Equipment:   Intra-op Plan:   Post-operative Plan: Extubation in OR  Informed Consent: I have reviewed the patients History and Physical, chart, labs and discussed the procedure including the risks, benefits and alternatives for the proposed anesthesia with the patient or authorized representative who has indicated his/her understanding and acceptance.     Dental advisory given  Plan Discussed with: CRNA  Anesthesia Plan Comments: (  On BID lovenox , last dose was 8pm. Not a candidate for spinal)         Anesthesia Quick Evaluation

## 2024-04-13 NOTE — Op Note (Signed)
 04/13/2024  3:12 PM  PATIENT:  [name]   MRN: [id number]  PRE-OPERATIVE DIAGNOSIS: Right hip primary localized osteoarthritis  POST-OPERATIVE DIAGNOSIS:  same  PROCEDURE:  Procedure(s): RIGHT TOTAL HIP REPLACEMENT, POSTERIOR APPROACH  PREOPERATIVE INDICATIONS:    [name] is an 78 y.o. female who has a diagnosis of right hip osteoarthritis and elected for surgical management after failing conservative treatment.  The risks benefits and alternatives were discussed with the patient including but not limited to the risks of nonoperative treatment, versus surgical intervention including infection, bleeding, nerve injury, periprosthetic fracture, the need for revision surgery, dislocation, leg length discrepancy, blood clots, cardiopulmonary complications, morbidity, mortality, among others, and they were willing to proceed.     OPERATIVE REPORT     SURGEON:  Fonda Olmsted, MD    ASSISTANT:  Army Daring, PA-C, (Present throughout the entire procedure,  necessary for completion of procedure in a timely manner, assisting with retraction, instrumentation, and closure)     ANESTHESIA: General  ESTIMATED BLOOD LOSS: 300 mL    COMPLICATIONS:  None.     UNIQUE ASPECTS OF THE CASE: She did not have as much remaining osteophyte on the side as she did on the other side.  I had excellent access to the acetabulum.  The femoral stem was a little bit more proud than the broach was, so I went with a 1.5 neck ball length, which had excellent stability.  The acetabulum had inferior and posterior rimming osteophytes.  Match the anterior wall almost exactly.  I was very stable posteriorly.  COMPONENTS:  Implant Name: ARMAN DAKIN APEX DEPUY - [id number] Type: Hips Inv. Item: ELIMINATOR HOLE APEX DEPUY Serial No.:  Manufacturer: [employer]PUY [employer] Lot No.: [id number] LRB: Right No. Used: 1 Action: Implanted   Implant Name: PIN SECTOR W/GRIP ACE CUP - [id number] Type: Hips Inv.  Item: PIN SECTOR W/GRIP ACE CUP Serial No.:  Manufacturer: [employer] Lot No.: [id number] LRB: Right No. Used: 1 Action: Implanted   Implant Name: SCREW 6.5MMX25MM - [id number] Type: Screw Inv. Item: SCREW 6.5MMX25MM Serial No.:  Manufacturer: [employer] Lot No.: [id number] LRB: Right No. Used: 1 Action: Implanted   Implant Name: STEM FEMORAL SZ STD ACTIS - [id number] Type: Stem Inv. Item: STEM FEMORAL SZ STD ACTIS Serial No.:  Manufacturer: [employer] Lot No.: [id number] LRB: Right No. Used: 1 Action: Implanted   Implant Name: LINER NEUTRAL 52X36MM PLUS 4 - [id number] Type: Liner Inv. Item: LINER NEUTRAL 52X36MM PLUS 4 Serial No.:  Manufacturer: [employer] Lot No.: [id number] LRB: Right No. Used: 1 Action: Implanted   Implant Name: HEAD M SROM PLUS 1.5 - [id number] Type: Hips Inv. Item: HEAD M SROM PLUS 1.5 Serial No.:  Manufacturer: [employer] Lot No.: [id number] LRB: Right No. Used: 1 Action: Implanted     PROCEDURE IN DETAIL:   The patient was met in the holding area and  identified.  The appropriate hip was identified and marked at the operative site.  The patient was then transported to the OR  and  placed under anesthesia.  At that point, the patient was  placed in the lateral decubitus position with the operative side up and  secured to the operating room table and all bony prominences padded.     The operative lower extremity was prepped from the iliac crest to the distal leg.  Sterile draping was performed.  Time out was performed prior to incision.      A  routine posterolateral approach was utilized via sharp dissection  carried down through the subcutaneous tissue.  Gross bleeders were Bovie coagulated.  The iliotibial band was identified and incised along the length of the skin incision.  Self-retaining retractors were  inserted.  With the hip internally rotated, the short external rotators   were identified. The piriformis and capsule was tagged with Ethibond, and the hip capsule released in a T-type fashion.  The femoral neck was exposed, and I resected the femoral neck using the appropriate jig. This was performed at approximately a thumb's breadth above the lesser trochanter.    I then exposed the deep acetabulum, cleared out any tissue including the ligamentum teres.  A wing retractor was placed.  After adequate visualization, I excised the labrum, and then sequentially reamed.  I placed the trial acetabulum, which seated nicely, and then impacted the real cup into place.  Appropriate version and inclination was confirmed clinically matching their bony anatomy, and also with the use of the jig.  I placed a cancellous screw to augment fixation.  A trial polyethylene liner was placed and the wing retractor removed.    I then prepared the proximal femur using the cookie-cutter, the lateralizing reamer, and then sequentially reamed and broached.  A trial broach, neck, and head was utilized, and I reduced the hip and it was found to have excellent stability with functional range of motion. The trial components were then removed, and the real polyethylene liner was placed.  I then impacted the real femoral prosthesis into place into the appropriate version, slightly anteverted to the normal anatomy, and I impacted the real head ball into place. The hip was then reduced and taken through functional range of motion and found to have excellent stability. Leg lengths were restored.  I then used a 2 mm drill bits to pass the Ethibond suture from the capsule and piriformis through the greater trochanter, and secured this. Excellent posterior capsular repair was achieved. I also closed the T in the capsule.  I then irrigated the hip copiously again, and repaired the fascia with Stratafix, followed by Vicryl for the subcutaneous tissue, Monocryl for the skin, Steri-Strips and sterile gauze. The  wounds were injected. The patient was then awakened and returned to PACU in stable and satisfactory condition. There were no complications.  Fonda Olmsted, MD Orthopedic Surgeon [phone]   04/13/2024 3:12 PM

## 2024-04-13 NOTE — Transfer of Care (Signed)
 Immediate Anesthesia Transfer of Care Note  Patient: [name]  Procedure(s) Performed: RIGHT TOTAL HIP REPLACEMENT, POSTERIOR APPROACH (Right: Hip)  Patient Location: PACU  Anesthesia Type:General  Level of Consciousness: awake, patient cooperative, and responds to stimulation  Airway & Oxygen Therapy: Patient Spontanous Breathing and Patient connected to face mask oxygen  Post-op Assessment: Report given to RN, Post -op Vital signs reviewed and stable, and Patient moving all extremities X 4  Post vital signs: Reviewed and stable  Last Vitals:  Vitals Value Taken Time  BP 132/81 04/13/24 15:53  Temp    Pulse 75 04/13/24 15:56  Resp 18 04/13/24 15:56  SpO2 96 % 04/13/24 15:56  Vitals shown include unfiled device data.  Last Pain:  Vitals:   04/13/24 1101  TempSrc:   PainSc: 0-No pain         Complications: No notable events documented.

## 2024-04-13 NOTE — Discharge Instructions (Signed)

## 2024-04-13 NOTE — Anesthesia Procedure Notes (Signed)
 Procedure Name: Intubation Date/Time: 04/13/2024 1:30 PM  Performed by: TRUE Shackleford J, CRNAPre-anesthesia Checklist: Patient identified, Emergency Drugs available, Suction available and Patient being monitored Patient Re-evaluated:Patient Re-evaluated prior to induction Oxygen Delivery Method: Circle System Utilized Preoxygenation: Pre-oxygenation with 100% oxygen Induction Type: IV induction Ventilation: Mask ventilation without difficulty Grade View: Grade I Tube type: Oral Tube size: 7.0 mm Number of attempts: 1 Airway Equipment and Method: Stylet and Oral airway Placement Confirmation: ETT inserted through vocal cords under direct vision, positive ETCO2 and breath sounds checked- equal and bilateral Secured at: 22 cm Tube secured with: Tape Dental Injury: Teeth and Oropharynx as per pre-operative assessment

## 2024-04-13 NOTE — Interval H&P Note (Signed)
 History and Physical Interval Note:  04/13/2024 11:33 AM  [name] Search  has presented today for surgery, with the diagnosis of Primary osteoarthritis of one hip, right.  The various methods of treatment have been discussed with the patient and family. After consideration of risks, benefits and other options for treatment, the patient has consented to  Procedure(s): ARTHROPLASTY, HIP, TOTAL,POSTERIOR APPROACH (Right) as a surgical intervention.  The patient's history has been reviewed, patient examined, no change in status, stable for surgery.  I have reviewed the patient's chart and labs.  Questions were answered to the patient's satisfaction.  She has had a previous stroke with significant right-sided hemiplegia, and we have discussed the risks for increased risk of instability, and we will do our best to obtain a stable construct.   [name]

## 2024-04-14 ENCOUNTER — Encounter (HOSPITAL_COMMUNITY): Payer: Self-pay | Admitting: Orthopedic Surgery

## 2024-04-14 LAB — GLUCOSE, CAPILLARY
Glucose-Capillary: 109 mg/dL — ABNORMAL HIGH (ref 70–99)
Glucose-Capillary: 117 mg/dL — ABNORMAL HIGH (ref 70–99)

## 2024-04-14 LAB — PROTIME-INR
INR: 1 (ref 0.8–1.2)
Prothrombin Time: 13.9 s (ref 11.4–15.2)

## 2024-04-14 LAB — CBC
HCT: 37 % (ref 36.0–46.0)
Hemoglobin: 12 g/dL (ref 12.0–15.0)
MCH: 31.8 pg (ref 26.0–34.0)
MCHC: 32.4 g/dL (ref 30.0–36.0)
MCV: 98.1 fL (ref 80.0–100.0)
Platelets: 238 10*3/uL (ref 150–400)
RBC: 3.77 MIL/uL — ABNORMAL LOW (ref 3.87–5.11)
RDW: 13.2 % (ref 11.5–15.5)
WBC: 11.8 10*3/uL — ABNORMAL HIGH (ref 4.0–10.5)
nRBC: 0 % (ref 0.0–0.2)

## 2024-04-14 LAB — BASIC METABOLIC PANEL WITH GFR
Anion gap: 10 (ref 5–15)
BUN: 9 mg/dL (ref 8–23)
CO2: 23 mmol/L (ref 22–32)
Calcium: 8 mg/dL — ABNORMAL LOW (ref 8.9–10.3)
Chloride: 105 mmol/L (ref 98–111)
Creatinine, Ser: 0.73 mg/dL (ref 0.44–1.00)
GFR, Estimated: >60 mL/min (ref >60)
Glucose, Bld: 121 mg/dL — ABNORMAL HIGH (ref 70–99)
Potassium: 4.1 mmol/L (ref 3.5–5.1)
Sodium: 138 mmol/L (ref 135–145)

## 2024-04-14 LAB — TYPE AND SCREEN
ABO/RH(D): O NEG
Antibody Screen: POSITIVE

## 2024-04-14 MED ORDER — HYDROCODONE-ACETAMINOPHEN 5-325 MG PO TABS
1.0000 | ORAL_TABLET | ORAL | Status: DC | PRN
  Administered 2024-04-14 – 2024-04-19 (×11): 2 via ORAL
  Filled 2024-04-14 (×13): qty 2

## 2024-04-14 MED ORDER — TRAMADOL HCL 50 MG PO TABS
50.0000 mg | ORAL_TABLET | ORAL | Status: DC | PRN
  Administered 2024-04-14: 50 mg via ORAL
  Filled 2024-04-14 (×3): qty 1

## 2024-04-14 MED ORDER — CHLORHEXIDINE GLUCONATE CLOTH 2 % EX PADS
6.0000 | MEDICATED_PAD | Freq: Every day | CUTANEOUS | Status: DC
  Administered 2024-04-14 – 2024-04-18 (×5): 6 via TOPICAL

## 2024-04-14 NOTE — Progress Notes (Signed)
 Physical Therapy Treatment Patient Details Name: [name] MRN: [id number] DOB: 03/29/1946 Today's Date: 04/14/2024   History of Present Illness [name] is a 78 y.o. female who presented 04/13/24 for elective Rt THA. PMHx: HTN, HLD, CVA, and factor 5 leiden mutation.    PT Comments  Pt greeted supine in bed, agreeable to a bed level session only. She continues to demonstrate guarded Rt hip positioning and reports 9/10 pain. Educated pt on RLE exercises to maintain ROM and strength. She engaged in a couple of AAROM exercises with noted resistance throughout the motion and significantly limited rain. Pt declined further treatment secondary to worsening pain, RN notified. Discussed the importance of engaging in mobility and completing exercises to aid in her recovery. Will continue to follow acutely and advance appropriately.    If plan is discharge home, recommend the following: A lot of help with walking and/or transfers;A lot of help with bathing/dressing/bathroom;Assistance with cooking/housework;Assist for transportation;Help with stairs or ramp for entrance   Can travel by private vehicle        Equipment Recommendations  BSC/3in1    Recommendations for Other Services       Precautions / Restrictions Precautions Precautions: Fall Recall of Precautions/Restrictions: Impaired Restrictions Weight Bearing Restrictions Per Provider Order: Yes RLE Weight Bearing Per Provider Order: Weight bearing as tolerated     Mobility  Bed Mobility Overal bed mobility: Needs Assistance Bed Mobility: Supine to Sit     Supine to sit: HOB elevated, Mod assist, Used rails     General bed mobility comments: Pt refused to attempt d/t pain.    Transfers Overall transfer level: Needs assistance Equipment used: Rolling walker (2 wheels) Transfers: Sit to/from Stand, Bed to chair/wheelchair/BSC Sit to Stand: From elevated surface, Mod assist Stand pivot transfers: Mod assist, +2  safety/equipment, +2 physical assistance (Son helped on pt's left side per her request and CNA moved recliner chair closer to pt.)         General transfer comment: Pt refused to attempt d/t pain.    Ambulation/Gait               General Gait Details: Unable d/t pain   Stairs             Wheelchair Mobility     Tilt Bed    Modified Rankin (Stroke Patients Only)       Balance Overall balance assessment: Needs assistance (Session remained bed level) Sitting-balance support: Feet supported, Bilateral upper extremity supported Sitting balance-Leahy Scale: Fair Sitting balance - Comments: Pt sat EOB with CGA and chronic posterior lean Postural control: Posterior lean Standing balance support: Bilateral upper extremity supported, During functional activity, Reliant on assistive device for balance Standing balance-Leahy Scale: Poor Standing balance comment: Pt dependent on RW and modA x2                            Communication Communication Communication: No apparent difficulties  Cognition Arousal: Alert Behavior During Therapy: Anxious   PT - Cognitive impairments: No apparent impairments                       PT - Cognition Comments: Pt A,Ox4. Pt very fearful of falling. She was anxious to mobilize as she had vaso vagal spells after her left hip replacement. Following commands: Intact      Cueing Cueing Techniques: Verbal cues, Visual cues  Exercises General Exercises - Lower  Extremity Ankle Circles/Pumps: Supine, AROM, Both, 5 reps Quad Sets: Supine, AAROM, Limitations (2 reps) Quad Sets Limitations: pain; pt unable to tolerate, guarding noted throughout. Hip ABduction/ADduction: Supine, AAROM, Right, Limitations (2 reps) Hip Abduction/Adduction Limitations: pain; pt unable to tolerate, guarding noted throughout.    General Comments        Pertinent Vitals/Pain Pain Assessment Pain Assessment: 0-10 Pain Score: 9  Pain  Location: R Hip Pain Descriptors / Indicators: Aching, Discomfort Pain Intervention(s): Premedicated before session, Limited activity within patient's tolerance, Repositioned    Home Living                          Prior Function            PT Goals (current goals can now be found in the care plan section) Acute Rehab PT Goals Patient Stated Goal: Have less pain Progress towards PT goals: Not progressing toward goals - comment (limited by pain)    Frequency    7X/week      PT Plan      Co-evaluation              AM-PAC PT 6 Clicks Mobility   Outcome Measure  Help needed turning from your back to your side while in a flat bed without using bedrails?: A Little Help needed moving from lying on your back to sitting on the side of a flat bed without using bedrails?: A Lot Help needed moving to and from a bed to a chair (including a wheelchair)?: Total Help needed standing up from a chair using your arms (e.g., wheelchair or bedside chair)?: A Lot Help needed to walk in hospital room?: Total Help needed climbing 3-5 steps with a railing? : Total 6 Click Score: 10    End of Session   Activity Tolerance: Patient limited by pain Patient left: in bed;with call bell/phone within reach;with bed alarm set;with nursing/sitter in room Nurse Communication: Mobility status PT Visit Diagnosis: Pain;Difficulty in walking, not elsewhere classified (R26.2);Other abnormalities of gait and mobility (R26.89);Unsteadiness on feet (R26.81) Pain - Right/Left: Right Pain - part of body: Hip     Time: 8746-8696 PT Time Calculation (min) (ACUTE ONLY): 10 min  Charges:    $Therapeutic Exercise: 8-22 mins PT General Charges $$ ACUTE PT VISIT: 1 Visit                     [name], PT, DPT Acute Rehabilitation Services Office: [phone] Secure Chat Preferred  [name] 04/14/2024, 1:09 PM

## 2024-04-14 NOTE — Progress Notes (Signed)
 7/2 I, Nava Song-Martin spoke telephonically with patient's son and daughter Shakeila Pfarr and Allyne Hebert III) to receive verbal consent of MOON Letter.

## 2024-04-14 NOTE — Progress Notes (Addendum)
 Subjective: 1 Day Post-Op s/p Procedure(s): RIGHT TOTAL HIP REPLACEMENT, POSTERIOR APPROACH   Patient is alert, oriented. Patient reports pain as well controlled right now, though was very severe in PACU. Having some itching this morning. No other complaints.   Objective:  PE: VITALS:   Vitals:   04/13/24 1820 04/13/24 2041 04/14/24 0452 04/14/24 0812  BP: (!) 156/72  127/63 (!) 113/56  Pulse: 87  74 70  Resp: 14 18 18 15   Temp: (!) 97.5 F (36.4 C)  98.3 F (36.8 C) 97.9 F (36.6 C)  TempSrc:  Oral Oral Oral  SpO2: 92%  92% 94%  Weight:      Height:       Laying in bed in no acute distress Normal respiratory effort Sensation to foot at baseline Incision CDI Performs 40 deg active FF at the hip  Flicker of movement at right great toe, no ability to dorsfilex though this is normal for patient. Hx of foot drop   LABS  Results for orders placed or performed during the hospital encounter of 04/13/24 (from the past 24 hours)  APTT     Status: None   Collection Time: 04/13/24 10:33 AM  Result Value Ref Range   aPTT 31 24 - 36 seconds  Protime-INR     Status: None   Collection Time: 04/13/24 10:33 AM  Result Value Ref Range   Prothrombin Time 13.4 11.4 - 15.2 seconds   INR 1.0 0.8 - 1.2  Basic metabolic panel per protocol     Status: Abnormal   Collection Time: 04/13/24 10:33 AM  Result Value Ref Range   Sodium 140 135 - 145 mmol/L   Potassium 3.7 3.5 - 5.1 mmol/L   Chloride 106 98 - 111 mmol/L   CO2 22 22 - 32 mmol/L   Glucose, Bld 120 (H) 70 - 99 mg/dL   BUN 16 8 - 23 mg/dL   Creatinine, Ser 9.28 0.44 - 1.00 mg/dL   Calcium 9.2 8.9 - 89.6 mg/dL   GFR, Estimated >39 >39 mL/min   Anion gap 12 5 - 15  Type and screen [facility]     Status: None   Collection Time: 04/13/24 11:15 AM  Result Value Ref Range   ABO/RH(D) O NEG    Antibody Screen POS    Sample Expiration      04/16/2024,2359 Performed at [facility], [address], [place], [place] [address]   Glucose, capillary     Status: Abnormal   Collection Time: 04/13/24  6:23 PM  Result Value Ref Range   Glucose-Capillary 148 (H) 70 - 99 mg/dL  CBC     Status: Abnormal   Collection Time: 04/13/24  6:33 PM  Result Value Ref Range   WBC 15.8 (H) 4.0 - 10.5 K/uL   RBC 4.23 3.87 - 5.11 MIL/uL   Hemoglobin 13.5 12.0 - 15.0 g/dL   HCT 58.1 63.9 - 53.9 %   MCV 98.8 80.0 - 100.0 fL   MCH 31.9 26.0 - 34.0 pg   MCHC 32.3 30.0 - 36.0 g/dL   RDW 86.7 88.4 - 84.4 %   Platelets 244 150 - 400 K/uL   nRBC 0.0 0.0 - 0.2 %  Creatinine, serum     Status: None   Collection Time: 04/13/24  6:33 PM  Result Value Ref Range   Creatinine, Ser 0.74 0.44 - 1.00 mg/dL   GFR, Estimated >39 >39 mL/min  Protime-INR  Status: None   Collection Time: 04/14/24  5:32 AM  Result Value Ref Range   Prothrombin Time 13.9 11.4 - 15.2 seconds   INR 1.0 0.8 - 1.2  CBC     Status: Abnormal   Collection Time: 04/14/24  5:32 AM  Result Value Ref Range   WBC 11.8 (H) 4.0 - 10.5 K/uL   RBC 3.77 (L) 3.87 - 5.11 MIL/uL   Hemoglobin 12.0 12.0 - 15.0 g/dL   HCT 62.9 63.9 - 53.9 %   MCV 98.1 80.0 - 100.0 fL   MCH 31.8 26.0 - 34.0 pg   MCHC 32.4 30.0 - 36.0 g/dL   RDW 86.7 88.4 - 84.4 %   Platelets 238 150 - 400 K/uL   nRBC 0.0 0.0 - 0.2 %  Basic metabolic panel     Status: Abnormal   Collection Time: 04/14/24  5:32 AM  Result Value Ref Range   Sodium 138 135 - 145 mmol/L   Potassium 4.1 3.5 - 5.1 mmol/L   Chloride 105 98 - 111 mmol/L   CO2 23 22 - 32 mmol/L   Glucose, Bld 121 (H) 70 - 99 mg/dL   BUN 9 8 - 23 mg/dL   Creatinine, Ser 9.26 0.44 - 1.00 mg/dL   Calcium 8.0 (L) 8.9 - 10.3 mg/dL   GFR, Estimated >39 >39 mL/min   Anion gap 10 5 - 15    DG HIP UNILAT W OR W/O PELVIS 2-3 VIEWS RIGHT Result Date: 04/13/2024 CLINICAL DATA:  Postop right hip replacement. EXAM: DG HIP (WITH OR WITHOUT PELVIS) 2-3V RIGHT COMPARISON:  None Available. FINDINGS: Right hip arthroplasty in expected  alignment. No periprosthetic lucency or fracture. Recent postsurgical change includes air and edema in the soft tissues. Previous left hip arthroplasty. IMPRESSION: Right hip arthroplasty without immediate postoperative complication. Electronically Signed   By: Andrea Gasman M.D.   On: 04/13/2024 16:43    Assessment/Plan: Principal Problem:   S/P total right hip arthroplasty  1 Day Post-Op s/p Procedure(s): RIGHT TOTAL HIP REPLACEMENT, POSTERIOR APPROACH  Weightbearing: WBAT RLE, up with therapy. Of note, patient had multiple vaso vagal spells when trying to ambulate after her left  hip replacement Insicional and dressing care: Reinforce dressings as needed Orthopedic device(s): patient will need to have on Bioness device prior to ambulating VTE prophylaxis: Bridging back to coumadin  with lovenox  per plan from coumadin  clinic Pain control: Will decrease hydrocodone  and add tramadol  to see if we can reduce itching, ok to given benadryl  as needed for itching Follow - up plan: 2 weeks with Dr. Josefina Dispo: pending PT evals today  Face to Face and HH orders placed. Patient already set up for HHPT with [facility] at discharge.   Contact information:   Army Daring, DEVONNA Tzzxijbd 8-5  After hours and holidays please check [email or web address] for group call information for [facility]  Army MARLA Daring 04/14/2024, 8:34 AM

## 2024-04-14 NOTE — Progress Notes (Signed)
 PHARMACY - ANTICOAGULATION CONSULT NOTE  Pharmacy Consult for restart home Coumadin  dosing Indication: FVL, LAC+, LT Coumadin  use (2010 following stroke)  Allergies  Allergen Reactions   Baclofen  Other (See Comments)    felt muscles were weaker.   Codeine Other (See Comments)    hallucination   Duloxetine Hcl Other (See Comments)    Cymbalta*  could not tolerate.terrible shakes, breathing problems.heart race   Influenza Virus Vaccine Other (See Comments)    feverish red raised at site. Takes this every year    Nitrofurantoin  Other (See Comments)    headaches whole time on med, didnt feel well   Zoloft [Sertraline] Other (See Comments)     pt didnt' like how she felt.    Patient Measurements: Height: 5' 4 (162.6 cm) Weight: 86.2 kg (190 lb) IBW/kg (Calculated) : 54.7 HEPARIN DW (KG): 73.7  Vital Signs: Temp: 97.9 F (36.6 C) (07/02 0812) Temp Source: Oral (07/02 0812) BP: 113/56 (07/02 0812) Pulse Rate: 70 (07/02 0812)  Labs: Recent Labs    04/13/24 1033 04/13/24 1833 04/14/24 0532  HGB  --  13.5 12.0  HCT  --  41.8 37.0  PLT  --  244 238  APTT 31  --   --   LABPROT 13.4  --  13.9  INR 1.0  --  1.0  CREATININE 0.71 0.74 0.73    Estimated Creatinine Clearance: 61.6 mL/min (by C-G formula based on SCr of 0.73 mg/dL).   Assessment: 69 YOF presents for a R-THR. Restart home Coumadin  dosing. She sees Technical sales engineer for [employer]. Per note from 03/2024 Start enoxaparin  80mg  every 12 hours on 04/06/24  On 04/14/24 restart warfarin 2.5mg  QD except 5mg  MWF and enoxaparin  80mg  every 12 hours. Overlap for at least 5 days. Recheck INR 04/19/24. If INR <2, continue enoxaparin  until INR >2.  INR subtherapeutic at 1 as expected - Coumadin  starts tonight. No bleeding noted, Hgb stable, platelets are normal.  Goal of Therapy:  INR 2-3 Monitor platelets by anticoagulation protocol: Yes   Plan:  Coumadin  dose (2.5 mg daily on Sunday, Tuesday, Thursday,  and Saturday / 5 mg on Monday, Wednesday, and Friday)  Lovenox  80 mg SQ q12h until INR >2. Start 7/2 0800  Thank you for involving pharmacy in this patient's care.  Delon Sax, PharmD, BCPS Clinical Pharmacist Clinical phone for 04/14/2024 is [phone] 04/14/2024 1:56 PM

## 2024-04-14 NOTE — Anesthesia Postprocedure Evaluation (Signed)
 Anesthesia Post Note  Patient: [name]  Procedure(s) Performed: RIGHT TOTAL HIP REPLACEMENT, POSTERIOR APPROACH (Right: Hip)     Patient location during evaluation: PACU Anesthesia Type: General Level of consciousness: awake and alert Pain management: pain level controlled Vital Signs Assessment: post-procedure vital signs reviewed and stable Respiratory status: spontaneous breathing, nonlabored ventilation and respiratory function stable Cardiovascular status: blood pressure returned to baseline and stable Postop Assessment: no apparent nausea or vomiting Anesthetic complications: no   No notable events documented.  Last Vitals:  Vitals:   04/14/24 0452 04/14/24 0812  BP: 127/63 (!) 113/56  Pulse: 74 70  Resp: 18 15  Temp: 36.8 C 36.6 C  SpO2: 92% 94%    Last Pain:  Vitals:   04/14/24 0911  TempSrc:   PainSc: 6                  Butler Levander Pinal

## 2024-04-14 NOTE — Evaluation (Signed)
 Physical Therapy Evaluation Patient Details Name: [name] MRN: [id number] DOB: 10-Nov-1945 Today's Date: 04/14/2024  History of Present Illness  [name] is a 78 y.o. female who presented 04/13/24 for elective Rt THA. PMHx: HTN, HLD, CVA, and factor 5 leiden mutation.  Clinical Impression  Pt admitted with above diagnosis. PTA, modI for functional mobility using a walker and modI for ADLs/IADLs. She lives alone in a apartment with a ramped entry. Pt has a CNA who is scheduled to stay with her for 2 weeks. Pt currently with functional limitations due to the deficits listed below (see PT Problem List). She required modA for bed mobility and modA x1-2 for transfers using RW. Pt was anxious and fearful of falling. She guarded her RLE throughout session maintaining hip flexed and adducted and self-limiting weight bearing from none to toe touch. Pt displayed a posterior lean during sitting and standing, which is baseline following her CVA. Pt will benefit from acute skilled PT to increase her independence and safety with mobility to allow discharge.       If plan is discharge home, recommend the following: A lot of help with walking and/or transfers;A lot of help with bathing/dressing/bathroom;Assistance with cooking/housework;Assist for transportation;Help with stairs or ramp for entrance   Can travel by private vehicle        Equipment Recommendations BSC/3in1  Recommendations for Other Services       Functional Status Assessment Patient has had a recent decline in their functional status and demonstrates the ability to make significant improvements in function in a reasonable and predictable amount of time.     Precautions / Restrictions Precautions Precautions: Fall Recall of Precautions/Restrictions: Impaired Precaution/Restrictions Comments: Pt with posterior lean secondary to CVA Restrictions Weight Bearing Restrictions Per Provider Order: Yes RLE Weight Bearing Per Provider  Order: Weight bearing as tolerated      Mobility  Bed Mobility Overal bed mobility: Needs Assistance Bed Mobility: Supine to Sit     Supine to sit: HOB elevated, Mod assist, Used rails     General bed mobility comments: Pt sat up on Lt side of bed with increased time. Assist to bring RLE off EOB, elevate trunk, and scoot hips fwd with use of bed pad. Pt maintained BUE support on bed/bedrail/PT. She demonstrated posterior lean which is baseline for her.    Transfers Overall transfer level: Needs assistance Equipment used: Rolling walker (2 wheels) Transfers: Sit to/from Stand, Bed to chair/wheelchair/BSC Sit to Stand: From elevated surface, Mod assist Stand pivot transfers: Mod assist, +2 safety/equipment, +2 physical assistance (Son helped on pt's left side per her request and CNA moved recliner chair closer to pt.)         General transfer comment: Cued pt on proper hand placement using RW, foot positioning on ground, and emphasis on fwd lean with nose over toes prior to powering up. Pt  stood from raised bed height with modA to power up and increased time to reach upright posture. Pt kept RLE guarded with hip flex and add, lacking floor contact able to progress to toe toe but no weight acceptance noted. Pt transferred to recliner chair on her left with CNA bringing seat closer to her and pt requesting son to support her on the left side to improve her confidence. Pt pivoted on her L foot to turn to chair with PT facilitating hip movement. Good eccentric control.    Ambulation/Gait  General Gait Details: Unable d/t pain  Stairs            Wheelchair Mobility     Tilt Bed    Modified Rankin (Stroke Patients Only)       Balance Overall balance assessment: Needs assistance Sitting-balance support: Feet supported, Bilateral upper extremity supported Sitting balance-Leahy Scale: Fair Sitting balance - Comments: Pt sat EOB with CGA and chronic  posterior lean Postural control: Posterior lean Standing balance support: Bilateral upper extremity supported, During functional activity, Reliant on assistive device for balance Standing balance-Leahy Scale: Poor Standing balance comment: Pt dependent on RW and modA x2                             Pertinent Vitals/Pain Pain Assessment Pain Assessment: 0-10 Pain Score: 6  Pain Location: R Hip Pain Descriptors / Indicators: Aching, Discomfort Pain Intervention(s): Monitored during session    Home Living Family/patient expects to be discharged to:: Private residence Living Arrangements: Alone Available Help at Discharge: Family;Available PRN/intermittently;Personal care attendant Type of Home: Apartment Home Access: Ramped entrance       Home Layout: One level Home Equipment: Shower seat;Hand held shower head;Grab bars - tub/shower;Rolling Walker (2 wheels);Rollator (4 wheels);Cane - single point      Prior Function Prior Level of Function : Independent/Modified Independent;Driving             Mobility Comments: ModI using three wheel walker. Pt had months of limited mobility d/t pain. ADLs Comments: Indep with ADLs. Managed medications. Pt has a pill box but doesn't use them.     Extremity/Trunk Assessment   Upper Extremity Assessment Upper Extremity Assessment: Overall WFL for tasks assessed (Pt with RUE residual weakness secondary to CVA)    Lower Extremity Assessment Lower Extremity Assessment: RLE deficits/detail (LLE overall WFL for tasks assessed) RLE Deficits / Details: Pt in active guarding position of hip flex and hip add. Limited AROM. Grossly 3-/5 strength. RLE: Unable to fully assess due to pain RLE Sensation: WNL RLE Coordination: decreased gross motor    Cervical / Trunk Assessment Cervical / Trunk Assessment: Kyphotic  Communication   Communication Communication: No apparent difficulties    Cognition Arousal: Alert Behavior During  Therapy: Anxious   PT - Cognitive impairments: No apparent impairments                       PT - Cognition Comments: Pt A,Ox4. Pt very fearful of falling. She was anxious to mobilize as she had vaso vagal spells after her left hip replacement. Following commands: Intact       Cueing Cueing Techniques: Verbal cues, Gestural cues, Tactile cues     General Comments General comments (skin integrity, edema, etc.): VSS on RA.    Exercises     Assessment/Plan    PT Assessment Patient needs continued PT services  PT Problem List Decreased strength;Decreased range of motion;Decreased activity tolerance;Decreased balance;Decreased mobility;Decreased knowledge of use of DME;Decreased safety awareness;Pain       PT Treatment Interventions DME instruction;Gait training;Stair training;Functional mobility training;Therapeutic activities;Therapeutic exercise;Balance training;Patient/family education    PT Goals (Current goals can be found in the Care Plan section)  Acute Rehab PT Goals Patient Stated Goal: Have less pain and recieve therapy to get better PT Goal Formulation: With patient/family Time For Goal Achievement: 04/28/24 Potential to Achieve Goals: Good    Frequency 7X/week     Co-evaluation  AM-PAC PT 6 Clicks Mobility  Outcome Measure Help needed turning from your back to your side while in a flat bed without using bedrails?: A Little Help needed moving from lying on your back to sitting on the side of a flat bed without using bedrails?: A Lot Help needed moving to and from a bed to a chair (including a wheelchair)?: Total Help needed standing up from a chair using your arms (e.g., wheelchair or bedside chair)?: A Lot Help needed to walk in hospital room?: Total Help needed climbing 3-5 steps with a railing? : Total 6 Click Score: 10    End of Session Equipment Utilized During Treatment: Gait belt Activity Tolerance: Patient limited by  pain (Treatment limited secondary to patient's anxiety and fears) Patient left: in chair;with call bell/phone within reach;with chair alarm set;with family/visitor present Nurse Communication: Mobility status;Patient requests pain meds PT Visit Diagnosis: Pain;Difficulty in walking, not elsewhere classified (R26.2);Other abnormalities of gait and mobility (R26.89);Unsteadiness on feet (R26.81) Pain - Right/Left: Right Pain - part of body: Hip    Time: 9062-8982 PT Time Calculation (min) (ACUTE ONLY): 40 min   Charges:   PT Evaluation $PT Eval Moderate Complexity: 1 Mod PT Treatments $Therapeutic Activity: 23-37 mins PT General Charges $$ ACUTE PT VISIT: 1 Visit         Randall SAUNDERS, PT, DPT Acute Rehabilitation Services Office: [phone] Secure Chat Preferred  Delon CHRISTELLA Callander 04/14/2024, 10:33 AM

## 2024-04-14 NOTE — Care Management Obs Status (Signed)
 MEDICARE OBSERVATION STATUS NOTIFICATION   Patient Details  Name: [name] MRN: [id number] Date of Birth: 10/17/1945   Medicare Observation Status Notification Given:  Yes    Jon Cruel 04/14/2024, 2:22 PM

## 2024-04-15 DIAGNOSIS — Z8249 Family history of ischemic heart disease and other diseases of the circulatory system: Secondary | ICD-10-CM | POA: Diagnosis not present

## 2024-04-15 DIAGNOSIS — Z90710 Acquired absence of both cervix and uterus: Secondary | ICD-10-CM | POA: Diagnosis not present

## 2024-04-15 DIAGNOSIS — N390 Urinary tract infection, site not specified: Secondary | ICD-10-CM | POA: Diagnosis present

## 2024-04-15 DIAGNOSIS — Z881 Allergy status to other antibiotic agents status: Secondary | ICD-10-CM | POA: Diagnosis not present

## 2024-04-15 DIAGNOSIS — M1611 Unilateral primary osteoarthritis, right hip: Secondary | ICD-10-CM | POA: Diagnosis present

## 2024-04-15 DIAGNOSIS — R197 Diarrhea, unspecified: Secondary | ICD-10-CM | POA: Diagnosis not present

## 2024-04-15 DIAGNOSIS — Z7901 Long term (current) use of anticoagulants: Secondary | ICD-10-CM | POA: Diagnosis not present

## 2024-04-15 DIAGNOSIS — Z79899 Other long term (current) drug therapy: Secondary | ICD-10-CM | POA: Diagnosis not present

## 2024-04-15 DIAGNOSIS — I10 Essential (primary) hypertension: Secondary | ICD-10-CM | POA: Diagnosis present

## 2024-04-15 DIAGNOSIS — Z841 Family history of disorders of kidney and ureter: Secondary | ICD-10-CM | POA: Diagnosis not present

## 2024-04-15 DIAGNOSIS — Z85828 Personal history of other malignant neoplasm of skin: Secondary | ICD-10-CM | POA: Diagnosis not present

## 2024-04-15 DIAGNOSIS — Z832 Family history of diseases of the blood and blood-forming organs and certain disorders involving the immune mechanism: Secondary | ICD-10-CM | POA: Diagnosis not present

## 2024-04-15 DIAGNOSIS — D6851 Activated protein C resistance: Secondary | ICD-10-CM | POA: Diagnosis present

## 2024-04-15 DIAGNOSIS — Z96642 Presence of left artificial hip joint: Secondary | ICD-10-CM | POA: Diagnosis present

## 2024-04-15 DIAGNOSIS — Z825 Family history of asthma and other chronic lower respiratory diseases: Secondary | ICD-10-CM | POA: Diagnosis not present

## 2024-04-15 DIAGNOSIS — Z833 Family history of diabetes mellitus: Secondary | ICD-10-CM | POA: Diagnosis not present

## 2024-04-15 DIAGNOSIS — Z800 Family history of malignant neoplasm of digestive organs: Secondary | ICD-10-CM | POA: Diagnosis not present

## 2024-04-15 DIAGNOSIS — Z87891 Personal history of nicotine dependence: Secondary | ICD-10-CM | POA: Diagnosis not present

## 2024-04-15 DIAGNOSIS — Z887 Allergy status to serum and vaccine status: Secondary | ICD-10-CM | POA: Diagnosis not present

## 2024-04-15 DIAGNOSIS — Z888 Allergy status to other drugs, medicaments and biological substances status: Secondary | ICD-10-CM | POA: Diagnosis not present

## 2024-04-15 DIAGNOSIS — R7303 Prediabetes: Secondary | ICD-10-CM | POA: Diagnosis present

## 2024-04-15 DIAGNOSIS — I69351 Hemiplegia and hemiparesis following cerebral infarction affecting right dominant side: Secondary | ICD-10-CM | POA: Diagnosis not present

## 2024-04-15 DIAGNOSIS — Z885 Allergy status to narcotic agent status: Secondary | ICD-10-CM | POA: Diagnosis not present

## 2024-04-15 DIAGNOSIS — E785 Hyperlipidemia, unspecified: Secondary | ICD-10-CM | POA: Diagnosis present

## 2024-04-15 LAB — CBC
HCT: 36.7 % (ref 36.0–46.0)
Hemoglobin: 11.7 g/dL — ABNORMAL LOW (ref 12.0–15.0)
MCH: 31.6 pg (ref 26.0–34.0)
MCHC: 31.9 g/dL (ref 30.0–36.0)
MCV: 99.2 fL (ref 80.0–100.0)
Platelets: 227 10*3/uL (ref 150–400)
RBC: 3.7 MIL/uL — ABNORMAL LOW (ref 3.87–5.11)
RDW: 13.6 % (ref 11.5–15.5)
WBC: 13 10*3/uL — ABNORMAL HIGH (ref 4.0–10.5)
nRBC: 0 % (ref 0.0–0.2)

## 2024-04-15 LAB — GLUCOSE, CAPILLARY
Glucose-Capillary: 136 mg/dL — ABNORMAL HIGH (ref 70–99)
Glucose-Capillary: 149 mg/dL — ABNORMAL HIGH (ref 70–99)

## 2024-04-15 LAB — PROTIME-INR
INR: 1.2 (ref 0.8–1.2)
Prothrombin Time: 15.5 s — ABNORMAL HIGH (ref 11.4–15.2)

## 2024-04-15 MED ORDER — METHOCARBAMOL 500 MG PO TABS
500.0000 mg | ORAL_TABLET | Freq: Three times a day (TID) | ORAL | Status: DC | PRN
  Administered 2024-04-15 – 2024-04-18 (×6): 500 mg via ORAL
  Filled 2024-04-15 (×7): qty 1

## 2024-04-15 NOTE — Plan of Care (Signed)

## 2024-04-15 NOTE — Progress Notes (Signed)
 Physical Therapy Treatment Patient Details Name: [name] MRN: [id number] DOB: 1946/09/10 Today's Date: 04/15/2024   History of Present Illness [name] is a 78 y.o. female who presented 04/13/24 for elective Rt THA. PMHx: HTN, HLD, CVA, and factor 5 leiden mutation.    PT Comments  Pt greeted supine in bed, pleasant and agreeable to PT session. Her pain appears to be better managed and she is not actively guarding her Rt hip at rest. Pt continues to be anxious to mobilize and fearful of falling. She requests +2 assist for OOB to increase her confidence. Pt has a chronic posterior lean secondary to CVA. She required increased time to complete tasks and multi-modal cueing. Pt took a couple of shuffling steps to transfer from bed>recliner chair while maintaining B knee flex. She needed assist with sequencing and maneuvering RW. Will continue to follow acutely and advance appropriately.     If plan is discharge home, recommend the following: A lot of help with walking and/or transfers;A lot of help with bathing/dressing/bathroom;Assistance with cooking/housework;Assist for transportation;Help with stairs or ramp for entrance   Can travel by private vehicle        Equipment Recommendations  BSC/3in1    Recommendations for Other Services       Precautions / Restrictions Precautions Precautions: Fall Recall of Precautions/Restrictions: Impaired Precaution/Restrictions Comments: Pt with posterior lean which is baseline for her secondary to CVA     Mobility  Bed Mobility Overal bed mobility: Needs Assistance Bed Mobility: Supine to Sit     Supine to sit: HOB elevated, Used rails, Min assist     General bed mobility comments: Pt sat up on L side of bed with increased time. Cues for sequencing. Assist to manage RLE and elevate trunk. Scooted pt fwd til feet flat with use of bed pad.    Transfers Overall transfer level: Needs assistance Equipment used: Rolling walker (2  wheels) Transfers: Sit to/from Stand, Bed to chair/wheelchair/BSC Sit to Stand: Mod assist, +2 physical assistance Stand pivot transfers: Mod assist, +2 physical assistance         General transfer comment: Pt stood from lowest bed height. Cues for proper hand positioning using RW, foot positioning to increase knee flex, fwd lean for nose over toes, and to use momentum to power up. ModA x2 to power up. Pt took increased time to reach upright posture. She initially maintained posterior lean and B knee flex. VC/TC to address posture and faciliate quad activation. Transferred to recliner chair on her left within the room. Good eccentric control, but attempting to sit early.    Ambulation/Gait Ambulation/Gait assistance: Min assist, +2 physical assistance Gait Distance (Feet): 5 Feet Assistive device: Rolling walker (2 wheels) Gait Pattern/deviations: Step-to pattern, Decreased step length - right, Decreased step length - left, Knee flexed in stance - right, Knee flexed in stance - left, Shuffle       General Gait Details: Pt took short slow steps lacking foot clearence. She kept B knee flexed and would increase posterior lean in order to advance LE. Cued to improve upright posture and increased WBing through BUE support on RW to offload RLE. She was turned by pivoting on toes and taking lateral side steps. Pt quickly fatigued and appeared to attempt to sit before reaching recliner chair. Assist to manuever RW and cues for sequencing.   Stairs             Wheelchair Mobility     Tilt Bed  Modified Rankin (Stroke Patients Only)       Balance Overall balance assessment: Needs assistance Sitting-balance support: Feet supported, Bilateral upper extremity supported Sitting balance-Leahy Scale: Fair Sitting balance - Comments: Pt sat EOB with CGA and chronic posterior lean Postural control: Posterior lean Standing balance support: Bilateral upper extremity supported, During  functional activity, Reliant on assistive device for balance Standing balance-Leahy Scale: Poor Standing balance comment: Pt dependent on RW and +2 assist.                            Communication Communication Communication: No apparent difficulties  Cognition Arousal: Alert Behavior During Therapy: Anxious   PT - Cognitive impairments: No apparent impairments                       PT - Cognition Comments: Pt continues to be fearful of falling. She requests that her personal CNA assists throughout the session for increased confidence. Cues for PLB throughout session. Following commands: Intact      Cueing Cueing Techniques: Verbal cues, Visual cues  Exercises      General Comments General comments (skin integrity, edema, etc.): Pt greeted on 0.5L O2, titrated to RA with SpO2 >90% throughout.      Pertinent Vitals/Pain Pain Assessment Pain Assessment: Faces Faces Pain Scale: Hurts even more Pain Location: R Hip Pain Descriptors / Indicators: Aching, Discomfort Pain Intervention(s): RN gave pain meds during session, Monitored during session, Repositioned    Home Living                          Prior Function            PT Goals (current goals can now be found in the care plan section) Acute Rehab PT Goals Patient Stated Goal: Return Home Progress towards PT goals: Progressing toward goals    Frequency    7X/week      PT Plan      Co-evaluation              AM-PAC PT 6 Clicks Mobility   Outcome Measure  Help needed turning from your back to your side while in a flat bed without using bedrails?: A Little Help needed moving from lying on your back to sitting on the side of a flat bed without using bedrails?: A Little Help needed moving to and from a bed to a chair (including a wheelchair)?: Total Help needed standing up from a chair using your arms (e.g., wheelchair or bedside chair)?: Total Help needed to walk in  hospital room?: Total Help needed climbing 3-5 steps with a railing? : Total 6 Click Score: 10    End of Session Equipment Utilized During Treatment: Gait belt Activity Tolerance: Patient tolerated treatment well;Patient limited by fatigue Patient left: in chair;with call bell/phone within reach;with chair alarm set;with family/visitor present Nurse Communication: Mobility status;Other (comment) (purewick removed and encouraged transfers to [facility] throughout the day) PT Visit Diagnosis: Pain;Difficulty in walking, not elsewhere classified (R26.2);Other abnormalities of gait and mobility (R26.89);Unsteadiness on feet (R26.81) Pain - Right/Left: Right Pain - part of body: Hip     Time: 9194-9167 PT Time Calculation (min) (ACUTE ONLY): 27 min  Charges:    $Gait Training: 8-22 mins $Therapeutic Activity: 8-22 mins PT General Charges $$ ACUTE PT VISIT: 1 Visit  Randall SAUNDERS, PT, DPT Acute Rehabilitation Services Office: [phone] Secure Chat Preferred  Delon CHRISTELLA Callander 04/15/2024, 9:08 AM

## 2024-04-15 NOTE — Progress Notes (Signed)
 PHARMACY - ANTICOAGULATION CONSULT NOTE  Pharmacy Consult for restart home Coumadin  dosing Indication: Factor V Leiden, Lupus anticoagulant positive, long term Coumadin  use (2010 following stroke)  Allergies  Allergen Reactions   Baclofen  Other (See Comments)    felt muscles were weaker.   Codeine Other (See Comments)    hallucination   Duloxetine Hcl Other (See Comments)    Cymbalta*  could not tolerate.terrible shakes, breathing problems.heart race   Influenza Virus Vaccine Other (See Comments)    feverish red raised at site. Takes this every year    Nitrofurantoin  Other (See Comments)    headaches whole time on med, didnt feel well   Zoloft [Sertraline] Other (See Comments)     pt didnt' like how she felt.    Patient Measurements: Height: 5' 4 (162.6 cm) Weight: 86.2 kg (190 lb) IBW/kg (Calculated) : 54.7 HEPARIN DW (KG): 73.7  Vital Signs: Temp: 99.4 F (37.4 C) (07/03 0400) Temp Source: Oral (07/03 0400) BP: 122/58 (07/03 0400) Pulse Rate: 85 (07/03 0400)  Labs: Recent Labs    04/13/24 1033 04/13/24 1833 04/13/24 1833 04/14/24 0532 04/15/24 0538  HGB  --  13.5   < > 12.0 11.7*  HCT  --  41.8  --  37.0 36.7  PLT  --  244  --  238 227  APTT 31  --   --   --   --   LABPROT 13.4  --   --  13.9 15.5*  INR 1.0  --   --  1.0 1.2  CREATININE 0.71 0.74  --  0.73  --    < > = values in this interval not displayed.    Estimated Creatinine Clearance: 61.6 mL/min (by C-G formula based on SCr of 0.73 mg/dL).   Assessment: 55 YOF presents for a R-THR. Pharmacy consulted to restart home warfarin dosing. 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 Per note from 03/2024 Start enoxaparin  80mg  every 12 hours on 04/06/24. On 04/14/24 restart warfarin 2.5mg  QD except 5mg  MWF and enoxaparin  80mg  every 12 hours. Overlap for at least 5 days. Recheck INR 04/19/24. If INR <2, continue enoxaparin  until INR >2.  INR subtherapeutic at 1.2 Hgb 11.7, Plt 227 - stable    PTA regimen: 2.5 mg daily on Sunday, Tuesday, Thursday, and Saturday / 5 mg on Monday, Wednesday, and Friday  Goal of Therapy:  INR 2-3 Monitor platelets by anticoagulation protocol: Yes   Plan:  Continue PTA regimen: 2.5mg  PO today (Thursday) --Coumadin  dose (2.5 mg daily on Sunday, Tuesday, Thursday, and Saturday / 5 mg on Monday, Wednesday, and Friday)  Lovenox  80 mg SQ q12h until INR >2 F/u s/sx bleeding and CBC daily  Daily INR   Thank you for involving pharmacy in this patient's care.  Sharyne Glatter, PharmD, BCCCP Clinical Pharmacist 04/15/2024 7:42 AM

## 2024-04-15 NOTE — Progress Notes (Signed)
 Subjective: 2 Days Post-Op s/p Procedure(s): RIGHT TOTAL HIP REPLACEMENT, POSTERIOR APPROACH   Patient is alert, oriented. Frustrated, feels like she was moved to bed too forcefully yesterday and that this really increased her pain. Does not want pain meds increased at this time. No other complaints.   Objective:  PE: VITALS:   Vitals:   04/14/24 0812 04/14/24 1432 04/14/24 2019 04/15/24 0400  BP: (!) 113/56 128/64 (!) 143/64 (!) 122/58  Pulse: 70 79 82 85  Resp: 15 18 19 18   Temp: 97.9 F (36.6 C) 98.6 F (37 C) 98.7 F (37.1 C) 99.4 F (37.4 C)  TempSrc: Oral Oral Oral Oral  SpO2: 94% 92% 92% 93%  Weight:      Height:       Laying in bed in no acute distress Normal respiratory effort Sensation to foot at baseline Incision CDI Performs 40 deg active FF at the hip  Flicker of movement at right great toe, no ability to dorsfilex though this is normal for patient. Hx of foot drop   LABS  Results for orders placed or performed during the hospital encounter of 04/13/24 (from the past 24 hours)  Glucose, capillary     Status: Abnormal   Collection Time: 04/14/24 11:40 AM  Result Value Ref Range   Glucose-Capillary 109 (H) 70 - 99 mg/dL  Glucose, capillary     Status: Abnormal   Collection Time: 04/14/24  4:20 PM  Result Value Ref Range   Glucose-Capillary 117 (H) 70 - 99 mg/dL  CBC     Status: Abnormal   Collection Time: 04/15/24  5:38 AM  Result Value Ref Range   WBC 13.0 (H) 4.0 - 10.5 K/uL   RBC 3.70 (L) 3.87 - 5.11 MIL/uL   Hemoglobin 11.7 (L) 12.0 - 15.0 g/dL   HCT 63.2 63.9 - 53.9 %   MCV 99.2 80.0 - 100.0 fL   MCH 31.6 26.0 - 34.0 pg   MCHC 31.9 30.0 - 36.0 g/dL   RDW 86.3 88.4 - 84.4 %   Platelets 227 150 - 400 K/uL   nRBC 0.0 0.0 - 0.2 %  Protime-INR     Status: Abnormal   Collection Time: 04/15/24  5:38 AM  Result Value Ref Range   Prothrombin Time 15.5 (H) 11.4 - 15.2 seconds   INR 1.2 0.8 - 1.2    DG HIP UNILAT W OR W/O PELVIS 2-3 VIEWS  RIGHT Result Date: 04/13/2024 CLINICAL DATA:  Postop right hip replacement. EXAM: DG HIP (WITH OR WITHOUT PELVIS) 2-3V RIGHT COMPARISON:  None Available. FINDINGS: Right hip arthroplasty in expected alignment. No periprosthetic lucency or fracture. Recent postsurgical change includes air and edema in the soft tissues. Previous left hip arthroplasty. IMPRESSION: Right hip arthroplasty without immediate postoperative complication. Electronically Signed   By: Andrea Gasman M.D.   On: 04/13/2024 16:43    Assessment/Plan: Principal Problem:   S/P total right hip arthroplasty  2 Days Post-Op s/p Procedure(s): RIGHT TOTAL HIP REPLACEMENT, POSTERIOR APPROACH - Low grade fever overnight with SpO2 dropping into high 80's reordered incentive spirometry as it doesn't look like she has one in the room, I have contacted nurses regarding getting this for her and teaching her how to use it  Weightbearing: WBAT RLE, up with therapy. Of note, patient had multiple vaso vagal spells when trying to ambulate after her left  hip replacement.  Insicional and dressing care: Reinforce dressings as needed Orthopedic device(s): patient will need to have on  Bioness device prior to ambulating VTE prophylaxis: Bridging back to coumadin  with lovenox  per plan from coumadin  clinic Pain control: Will decrease hydrocodone  and tramadol  Follow - up plan: 2 weeks with Dr. Josefina Dispo: pending progress with PT. Patient would like to discharge home today, we just need to make sure she is safe from a mobility standpoint  Face to Face and HH orders placed. Patient already set up for HHPT with [facility] at discharge.   Contact information:   Army Daring, DEVONNA [facility] 8-5  After hours and holidays please check [email or web address] for group call information for [facility]  Army MARLA Daring 04/15/2024, 7:30 AM

## 2024-04-15 NOTE — Progress Notes (Signed)
 Physical Therapy Treatment Patient Details Name: [name] MRN: [id number] DOB: 07-01-1946 Today's Date: 04/15/2024   History of Present Illness [name] is a 78 y.o. female who presented 04/13/24 for elective Rt THA. PMHx: HTN, HLD, CVA, and factor 5 leiden mutation.    PT Comments  Pt engaged in sit<>stand training from lowest bed height using RW. She performed 4 reps with min-modA and seated rest breaks in between each bout. Moderate cues for sequencing. Pt took increased time to reach upright posture with VC/TC to increase hip/knee extension. She declined to transfer to recliner chair d/t pain. Educated pt on the importance of mobilizing frequently throughout the day to aid in her recovery. Reviewed RLE HEP and discussed how her CNA could assist her with the exercises. Encouraged pt to sit up in recliner chair for dinner.    If plan is discharge home, recommend the following: A lot of help with walking and/or transfers;A lot of help with bathing/dressing/bathroom;Assistance with cooking/housework;Assist for transportation;Help with stairs or ramp for entrance   Can travel by private vehicle        Equipment Recommendations  BSC/3in1    Recommendations for Other Services       Precautions / Restrictions Precautions Precautions: Fall Recall of Precautions/Restrictions: Impaired Precaution/Restrictions Comments: Pt with posterior lean which is baseline for her secondary to CVA Restrictions Weight Bearing Restrictions Per Provider Order: Yes RLE Weight Bearing Per Provider Order: Weight bearing as tolerated     Mobility  Bed Mobility Overal bed mobility: Needs Assistance Bed Mobility: Supine to Sit, Sit to Supine     Supine to sit: HOB elevated, Used rails, Min assist Sit to supine: Mod assist   General bed mobility comments: Pt sat up on L side of bed with increased time. Cues for sequencing. Assist to manage RLE and elevate trunk. Scooted pt fwd til feet flat with use  of bed pad. Returning to bed pt was fatigued, assisted in controlling trunk down and bringing BLE onto bed. Repositioned with +2 assist and use of bed features and bed pad.    Transfers Overall transfer level: Needs assistance Equipment used: Rolling walker (2 wheels) Transfers: Sit to/from Stand Sit to Stand: Mod assist, Min assist, +2 safety/equipment Stand pivot transfers: +2 physical assistance, Min assist         General transfer comment: Pt refused to transfer to recliner chair d/t pain. Encouraged her to sit up for dinner and stay up for a couple of hours more than once each day. Pt performed sit<>stand from lowest bed height x4 reps. She initially required modA, able to lessen to minA, and as she fatigued required modA again. Cues for proper hand placement. Pt opted to maintain BUE support on RW and had CNA assist in blocking RW. Cued use of momentum and increased fwd lean to power up. Pt requires increased time to reach upright posture and B knee ext. Good eccentric control.    Ambulation/Gait Ambulation/Gait assistance: Min assist, +2 physical assistance Gait Distance (Feet): 5 Feet Assistive device: Rolling walker (2 wheels) Gait Pattern/deviations: Step-to pattern, Decreased step length - right, Decreased step length - left, Knee flexed in stance - right, Knee flexed in stance - left, Shuffle       General Gait Details: Deferred d/t pain   Stairs             Wheelchair Mobility     Tilt Bed    Modified Rankin (Stroke Patients Only)  Balance Overall balance assessment: Needs assistance Sitting-balance support: Feet supported, Bilateral upper extremity supported Sitting balance-Leahy Scale: Fair Sitting balance - Comments: Pt sat EOB with CGA and chronic posterior lean Postural control: Posterior lean Standing balance support: Bilateral upper extremity supported, During functional activity, Reliant on assistive device for balance Standing  balance-Leahy Scale: Poor Standing balance comment: Pt dependent on RW and min-modA.                            Communication Communication Communication: No apparent difficulties  Cognition Arousal: Alert Behavior During Therapy: Anxious   PT - Cognitive impairments: No apparent impairments                       PT - Cognition Comments: Pt continues to be fearful of falling. She requests that her personal CNA assists throughout the session for increased confidence. Cues for PLB throughout session. Following commands: Intact      Cueing Cueing Techniques: Verbal cues, Visual cues  Exercises      General Comments        Pertinent Vitals/Pain Pain Assessment Pain Assessment: 0-10 Pain Score: 9  Faces Pain Scale: Hurts even more Pain Location: R Hip Pain Descriptors / Indicators: Discomfort, Grimacing, Guarding Pain Intervention(s): RN gave pain meds during session, Monitored during session, Limited activity within patient's tolerance, Repositioned    Home Living                          Prior Function            PT Goals (current goals can now be found in the care plan section) Acute Rehab PT Goals Patient Stated Goal: Return Home Progress towards PT goals: Progressing toward goals    Frequency    7X/week      PT Plan      Co-evaluation              AM-PAC PT 6 Clicks Mobility   Outcome Measure  Help needed turning from your back to your side while in a flat bed without using bedrails?: A Little Help needed moving from lying on your back to sitting on the side of a flat bed without using bedrails?: A Little Help needed moving to and from a bed to a chair (including a wheelchair)?: Total Help needed standing up from a chair using your arms (e.g., wheelchair or bedside chair)?: Total Help needed to walk in hospital room?: Total Help needed climbing 3-5 steps with a railing? : Total 6 Click Score: 10    End of  Session Equipment Utilized During Treatment: Gait belt Activity Tolerance: Patient limited by pain;Patient limited by fatigue Patient left: in bed;with call bell/phone within reach;with bed alarm set;with family/visitor present Nurse Communication: Mobility status;Other (comment) (Pt highly encouraged to sit up for dinner in the recliner chair) PT Visit Diagnosis: Pain;Difficulty in walking, not elsewhere classified (R26.2);Other abnormalities of gait and mobility (R26.89);Unsteadiness on feet (R26.81) Pain - Right/Left: Right Pain - part of body: Hip     Time: 8656-8592 PT Time Calculation (min) (ACUTE ONLY): 24 min  Charges:    $Therapeutic Activity: 23-37 mins PT General Charges $$ ACUTE PT VISIT: 1 Visit                     Randall SAUNDERS, PT, DPT Acute Rehabilitation Services Office: [phone] Secure Chat Preferred  Delon HERO  Miles Leyda 04/15/2024, 2:56 PM

## 2024-04-16 LAB — BASIC METABOLIC PANEL WITH GFR
Anion gap: 10 (ref 5–15)
BUN: 8 mg/dL (ref 8–23)
CO2: 25 mmol/L (ref 22–32)
Calcium: 8.3 mg/dL — ABNORMAL LOW (ref 8.9–10.3)
Chloride: 103 mmol/L (ref 98–111)
Creatinine, Ser: 0.64 mg/dL (ref 0.44–1.00)
GFR, Estimated: >60 mL/min (ref >60)
Glucose, Bld: 111 mg/dL — ABNORMAL HIGH (ref 70–99)
Potassium: 4 mmol/L (ref 3.5–5.1)
Sodium: 138 mmol/L (ref 135–145)

## 2024-04-16 LAB — PROTIME-INR
INR: 1.2 (ref 0.8–1.2)
Prothrombin Time: 15.6 s — ABNORMAL HIGH (ref 11.4–15.2)

## 2024-04-16 LAB — CBC
HCT: 35.1 % — ABNORMAL LOW (ref 36.0–46.0)
Hemoglobin: 11.1 g/dL — ABNORMAL LOW (ref 12.0–15.0)
MCH: 31.6 pg (ref 26.0–34.0)
MCHC: 31.6 g/dL (ref 30.0–36.0)
MCV: 100 fL (ref 80.0–100.0)
Platelets: 220 K/uL (ref 150–400)
RBC: 3.51 MIL/uL — ABNORMAL LOW (ref 3.87–5.11)
RDW: 13.3 % (ref 11.5–15.5)
WBC: 12.2 K/uL — ABNORMAL HIGH (ref 4.0–10.5)
nRBC: 0 % (ref 0.0–0.2)

## 2024-04-16 NOTE — Progress Notes (Signed)
 3 Days Post-Op Procedure(s) (LRB): RIGHT TOTAL HIP REPLACEMENT, POSTERIOR APPROACH (Right)  Subjective: Patient is alert, oriented. Still moderate pain today.  Walked 5 ft and able to stand with PT yesterday. Does not want pain meds increased at this time. Unsure whether she's able to go home yet.  No other complaints.   Objective:  PE: VITALS:   Vitals:   04/15/24 1650 04/15/24 2025 04/16/24 0338 04/16/24 0757  BP:  (!) 128/50 (!) 118/58 132/80  Pulse:  92 82 83  Resp:  17 18 16   Temp:  99.7 F (37.6 C) 98.7 F (37.1 C) 100.3 F (37.9 C)  TempSrc:  Oral Oral   SpO2: 96% 95% 93% 100%  Weight:      Height:       Laying in bed in no acute distress Normal respiratory effort Sensation to foot at baseline Incision CDI Performs 40 deg active FF at the hip  Flicker of movement at right great toe, no ability to dorsfilex though this is normal for patient. Hx of foot drop   LABS  Results for orders placed or performed during the hospital encounter of 04/13/24 (from the past 24 hours)  Glucose, capillary     Status: Abnormal   Collection Time: 04/15/24 11:48 AM  Result Value Ref Range   Glucose-Capillary 136 (H) 70 - 99 mg/dL  Glucose, capillary     Status: Abnormal   Collection Time: 04/15/24  5:18 PM  Result Value Ref Range   Glucose-Capillary 149 (H) 70 - 99 mg/dL  Protime-INR     Status: Abnormal   Collection Time: 04/16/24  5:28 AM  Result Value Ref Range   Prothrombin Time 15.6 (H) 11.4 - 15.2 seconds   INR 1.2 0.8 - 1.2  CBC     Status: Abnormal   Collection Time: 04/16/24  5:28 AM  Result Value Ref Range   WBC 12.2 (H) 4.0 - 10.5 K/uL   RBC 3.51 (L) 3.87 - 5.11 MIL/uL   Hemoglobin 11.1 (L) 12.0 - 15.0 g/dL   HCT 64.8 (L) 63.9 - 53.9 %   MCV 100.0 80.0 - 100.0 fL   MCH 31.6 26.0 - 34.0 pg   MCHC 31.6 30.0 - 36.0 g/dL   RDW 86.6 88.4 - 84.4 %   Platelets 220 150 - 400 K/uL   nRBC 0.0 0.0 - 0.2 %  Basic metabolic panel     Status: Abnormal   Collection  Time: 04/16/24  5:28 AM  Result Value Ref Range   Sodium 138 135 - 145 mmol/L   Potassium 4.0 3.5 - 5.1 mmol/L   Chloride 103 98 - 111 mmol/L   CO2 25 22 - 32 mmol/L   Glucose, Bld 111 (H) 70 - 99 mg/dL   BUN 8 8 - 23 mg/dL   Creatinine, Ser 9.35 0.44 - 1.00 mg/dL   Calcium 8.3 (L) 8.9 - 10.3 mg/dL   GFR, Estimated >39 >39 mL/min   Anion gap 10 5 - 15    No results found.   Assessment/Plan: Principal Problem:   S/P total right hip arthroplasty  3 Days Post-Op s/p Procedure(s): RIGHT TOTAL HIP REPLACEMENT, POSTERIOR APPROACH  7/3: Low grade fever overnight with SpO2 dropping into high 80's reordered incentive spirometry as it doesn't look like she has one in the room, I have contacted nurses regarding getting this for her and teaching her how to use it  7/4: Low grade fever again over night to 100.3 per friend at  bedside, since come down.  Pt would like to become more mobile prior to dispo, strongly prefers dispo home as opposed to SNF.  Continue mobilizing with PT/OT.   Weightbearing: WBAT RLE, up with therapy. Of note, patient had multiple vaso vagal spells when trying to ambulate after her left  hip replacement.  Insicional and dressing care: Reinforce dressings as needed Orthopedic device(s): patient will need to have on Bioness device prior to ambulating VTE prophylaxis: Bridging back to coumadin  with lovenox  per plan from coumadin  clinic Pain control: Will decrease hydrocodone  and tramadol  Follow - up plan: 2 weeks with Dr. Josefina Dispo: pending progress with PT. Patient would like to discharge home today, we just need to make sure she is safe from a mobility standpoint  Face to Face and HH orders placed. Patient already set up for HHPT with [facility] at discharge.    Contact information:   Bernarda Mclean, PA-C [facility] 8-5  After hours and holidays please check [email or web address] for group call information for [facility]  Bernarda CHRISTELLA Mclean 04/16/2024, 9:12 AM

## 2024-04-16 NOTE — Progress Notes (Signed)
 Physical Therapy Treatment Patient Details Name: [name] MRN: [id number] DOB: Oct 22, 1945 Today's Date: 04/16/2024   History of Present Illness [name] is a 78 y.o. female who presented 04/13/24 for elective Rt THA. PMHx: HTN, HLD, CVA, and factor 5 leiden mutation.    PT Comments  Pt greeted supine in bed, pleasant and agreeable to PT session. She is making steady progress towards her acute PT goals demonstrated by advanced functional mobility with decreased physical assistance. Pt engaged in two bouts of in-room ambulation using RW with chair follow. She required min-modA for safety and stability. Multi-modal cueing was utilized to improve gait mechanics and sequencing with limited carryover noted. Pt is limited by decreased activity tolerance, anxiety, fear of falling, unsteadiness, and pain. Will continue to follow acutely and advance appropriately.     If plan is discharge home, recommend the following: A lot of help with walking and/or transfers;A lot of help with bathing/dressing/bathroom;Assistance with cooking/housework;Assist for transportation;Help with stairs or ramp for entrance   Can travel by private vehicle        Equipment Recommendations  BSC/3in1    Recommendations for Other Services       Precautions / Restrictions Precautions Precautions: Fall Recall of Precautions/Restrictions: Impaired Precaution/Restrictions Comments: Pt with posterior lean which is baseline for her secondary to CVA Restrictions Weight Bearing Restrictions Per Provider Order: Yes RLE Weight Bearing Per Provider Order: Weight bearing as tolerated     Mobility  Bed Mobility Overal bed mobility: Needs Assistance Bed Mobility: Supine to Sit     Supine to sit: HOB elevated, Used rails, Contact guard     General bed mobility comments: Assist to elevate trunk and scoot fwd til feet flat.    Transfers Overall transfer level: Needs assistance Equipment used: Rolling walker (2  wheels) Transfers: Sit to/from Stand Sit to Stand: Min assist           General transfer comment: Pt stood from lowest bed height x3 reps and recliner chair x2 reps. She demonstrated proper hand placement using RW. Powered up with minA. Increased time to reach upright posture with B knee ext. Good eccentric control.    Ambulation/Gait Ambulation/Gait assistance: Mod assist, Min assist, +2 safety/equipment (chair follow) Gait Distance (Feet): 8 Feet (x2, prolonged seated rest between bouts) Assistive device: Rolling walker (2 wheels) Gait Pattern/deviations: Step-to pattern, Decreased step length - right, Decreased step length - left, Knee flexed in stance - right, Knee flexed in stance - left, Leaning posteriorly, Decreased weight shift to right Gait velocity: decreased     General Gait Details: Pt ambulated within the room by taking short slow steps. In order to advance LE she would lean bkwds prior to lift foot. Cued pt to maintain upright posture and increase WBing into BUE support on RW to offload LE and take steps. Pt self-limited RLE WBing and made flat foot contact with ground. Pt required modA to stabilize trunk as she became increasingly anxious/fearful and fatigued; otherwise, majority minA.   Stairs             Wheelchair Mobility     Tilt Bed    Modified Rankin (Stroke Patients Only)       Balance Overall balance assessment: Needs assistance Sitting-balance support: Feet supported, Bilateral upper extremity supported Sitting balance-Leahy Scale: Fair   Postural control: Posterior lean Standing balance support: Bilateral upper extremity supported, During functional activity, Reliant on assistive device for balance Standing balance-Leahy Scale: Poor Standing balance comment: Pt dependent  on RW and min-modA.                            Communication Communication Communication: No apparent difficulties  Cognition Arousal: Alert Behavior During  Therapy: Anxious   PT - Cognitive impairments: Sequencing, Initiation, Safety/Judgement                       PT - Cognition Comments: Pt is fearful of falling and constantly searches for external support bilat. She becomes overwhelmed during mobility and will attempt to sit without warning or too soon before she reaches a supportive surface. During gait pt's RR increases significantly, cues for PLB technique. Following commands: Intact      Cueing Cueing Techniques: Verbal cues, Tactile cues  Exercises      General Comments General comments (skin integrity, edema, etc.): Pt wearing bioness on RLE throughout session.      Pertinent Vitals/Pain Pain Assessment Pain Assessment: Faces Faces Pain Scale: Hurts little more Pain Location: R Hip Pain Descriptors / Indicators: Discomfort, Guarding Pain Intervention(s): Premedicated before session, Monitored during session, Repositioned    Home Living                          Prior Function            PT Goals (current goals can now be found in the care plan section) Acute Rehab PT Goals Patient Stated Goal: Return Home Progress towards PT goals: Progressing toward goals    Frequency    7X/week      PT Plan      Co-evaluation              AM-PAC PT 6 Clicks Mobility   Outcome Measure  Help needed turning from your back to your side while in a flat bed without using bedrails?: A Little Help needed moving from lying on your back to sitting on the side of a flat bed without using bedrails?: A Little Help needed moving to and from a bed to a chair (including a wheelchair)?: A Little Help needed standing up from a chair using your arms (e.g., wheelchair or bedside chair)?: A Little Help needed to walk in hospital room?: Total Help needed climbing 3-5 steps with a railing? : Total 6 Click Score: 14    End of Session Equipment Utilized During Treatment: Gait belt Activity Tolerance: Patient  tolerated treatment well Patient left: in chair;with call bell/phone within reach;with chair alarm set Nurse Communication: Mobility status PT Visit Diagnosis: Pain;Difficulty in walking, not elsewhere classified (R26.2);Other abnormalities of gait and mobility (R26.89);Unsteadiness on feet (R26.81) Pain - Right/Left: Right Pain - part of body: Hip     Time: 1132-1202 PT Time Calculation (min) (ACUTE ONLY): 30 min  Charges:    $Gait Training: 8-22 mins $Therapeutic Activity: 8-22 mins PT General Charges $$ ACUTE PT VISIT: 1 Visit                     Randall SAUNDERS, PT, DPT Acute Rehabilitation Services Office: [phone] Secure Chat Preferred  Delon CHRISTELLA Callander 04/16/2024, 12:48 PM

## 2024-04-16 NOTE — Plan of Care (Signed)
   Problem: Education: Goal: Knowledge of General Education information will improve Description Including pain rating scale, medication(s)/side effects and non-pharmacologic comfort measures Outcome: Progressing

## 2024-04-16 NOTE — Progress Notes (Deleted)
 Patient scheduled for discharge, but prior to discharging, complained of dizziness after working with physical therapist, which had resolved upon notifying this provider. Patient requested to stay another night afraid that she did not want to leave and then have to return to the hospital in case something happened. She had asked to leave after dialysis in the AM. Dr. Verdene made aware. Ok per provider.

## 2024-04-16 NOTE — Plan of Care (Signed)

## 2024-04-16 NOTE — Progress Notes (Signed)
 PHARMACY - ANTICOAGULATION CONSULT NOTE  Pharmacy Consult for restart home Coumadin  dosing Indication: Factor V Leiden, Lupus anticoagulant positive, long term Coumadin  use (2010 following stroke)  Allergies  Allergen Reactions   Baclofen  Other (See Comments)    felt muscles were weaker.   Codeine Other (See Comments)    hallucination   Duloxetine Hcl Other (See Comments)    Cymbalta*  could not tolerate.terrible shakes, breathing problems.heart race   Influenza Virus Vaccine Other (See Comments)    feverish red raised at site. Takes this every year    Nitrofurantoin  Other (See Comments)    headaches whole time on med, didnt feel well   Zoloft [Sertraline] Other (See Comments)     pt didnt' like how she felt.    Patient Measurements: Height: 5' 4 (162.6 cm) Weight: 86.2 kg (190 lb) IBW/kg (Calculated) : 54.7 HEPARIN DW (KG): 73.7  Vital Signs: Temp: 98.9 F (37.2 C) (07/04 0924) Temp Source: Oral (07/04 0924) BP: 132/80 (07/04 0757) Pulse Rate: 83 (07/04 0757)  Labs: Recent Labs    04/13/24 1033 04/13/24 1033 04/13/24 1833 04/14/24 0532 04/15/24 0538 04/16/24 0528  HGB  --    < > 13.5 12.0 11.7* 11.1*  HCT  --    < > 41.8 37.0 36.7 35.1*  PLT  --    < > 244 238 227 220  APTT 31  --   --   --   --   --   LABPROT 13.4  --   --  13.9 15.5* 15.6*  INR 1.0  --   --  1.0 1.2 1.2  CREATININE 0.71  --  0.74 0.73  --  0.64   < > = values in this interval not displayed.    Estimated Creatinine Clearance: 61.6 mL/min (by C-G formula based on SCr of 0.64 mg/dL).   Assessment: 42 YOF presents for a R-THR. Pharmacy consulted to restart home warfarin dosing. She sees Technical sales engineer for [employer]. Per note from 03/2024 Start enoxaparin  80mg  every 12 hours on 04/06/24. On 04/14/24 restart warfarin 2.5mg  QD except 5mg  MWF and enoxaparin  80mg  every 12 hours. Overlap for at least 5 days. Recheck INR 04/19/24. If INR <2, continue enoxaparin  until INR >2.  7/4  AM: INR remains subtherapeutic at 1.2 following 2 doses of warfarin per patient's PTA regimen. CBC stable. Lack of rise is not out of the realm of possibility with only 2 doses- if does not increase tomorrow would consider an increased dose. No major medication changes inpatient that would cause a decreased INR.   PTA regimen: 2.5 mg daily on Sunday, Tuesday, Thursday, and Saturday / 5 mg on Monday, Wednesday, and Friday  Goal of Therapy:  INR 2-3 Monitor platelets by anticoagulation protocol: Yes   Plan:  Continue PTA regimen: 2.5mg  PO today (Thursday) --Coumadin  dose (2.5 mg daily on Sunday, Tuesday, Thursday, and Saturday / 5 mg on Monday, Wednesday, and Friday)  Lovenox  80 mg SQ q12h until INR >2 F/u s/sx bleeding and CBC daily  Daily INR   Thank you for involving pharmacy in this patient's care.  Massie Fila, PharmD Clinical Pharmacist  04/16/2024 9:58 AM

## 2024-04-16 NOTE — Progress Notes (Signed)
 Physical Therapy Treatment Patient Details Name: [name] MRN: [id number] DOB: 1946-02-05 Today's Date: 04/16/2024   History of Present Illness [name] is a 78 y.o. female who presented 04/13/24 for elective Rt THA. PMHx: HTN, HLD, CVA, and factor 5 leiden mutation.    PT Comments  Today's session focused on gait training. Pt engaged in three bouts of short distance ambulation using RW. She required min-modA and a close chair follow. Pt achieved a maximum of ~57ft before requiring a prolonged seated rest break. She displayed multiple gait abnormalities with limited ability to correct. Pt becomes tachypneic during ambulation and requires cues for PLB technique. She is highly anxious, fearful of falling, and requests +2 assist throughout session to increase her confidence. Caregiver reported she doesn't feel comfortable managing the pt at home. May need to consider post-acute rehab <3 hours/day therapy, pending pt progress.     If plan is discharge home, recommend the following: A lot of help with bathing/dressing/bathroom;Assistance with cooking/housework;Assist for transportation;Help with stairs or ramp for entrance;Two people to help with walking and/or transfers   Can travel by private vehicle        Equipment Recommendations  BSC/3in1    Recommendations for Other Services       Precautions / Restrictions Precautions Precautions: Fall Recall of Precautions/Restrictions: Impaired Restrictions Weight Bearing Restrictions Per Provider Order: Yes RLE Weight Bearing Per Provider Order: Weight bearing as tolerated     Mobility  Bed Mobility Overal bed mobility: Needs Assistance Bed Mobility: Sit to Supine     Supine to sit: HOB elevated, Used rails, Contact guard Sit to supine: Min assist   General bed mobility comments: Assist to bring BLE back into bed and reposition so she was centered on her back.    Transfers Overall transfer level: Needs assistance Equipment  used: Rolling walker (2 wheels) Transfers: Sit to/from Stand, Bed to chair/wheelchair/BSC Sit to Stand: Min assist Stand pivot transfers: Mod assist, +2 safety/equipment         General transfer comment: Pt stood from recliner chair by pushing up with LUE support on armrest and RUE on RW. She takes increased time to stand and presses knees together to aid in power up. Pt reuires minA to stabilize given posterior lean. Cues to adjust LE positioning and increase hip/knee extension. Pt quickly returned to sitting 3 times secondary to fatigue and posterior LOB. Good eccentric control. Pt transferred from chair to bed on her R by taking short slow steps. She was pivoting to her left, assist to manuever RW. Pt attempted to sit before reaching the bed required modA x2 to correct and turn hips so she was fully supported on bed.    Ambulation/Gait Ambulation/Gait assistance: Mod assist, Min assist, +2 safety/equipment (chair follow) Gait Distance (Feet): 8 Feet (x3; prolonged seated rest breaks following each bout) Assistive device: Rolling walker (2 wheels) Gait Pattern/deviations: Step-to pattern, Decreased step length - right, Decreased step length - left, Knee flexed in stance - right, Knee flexed in stance - left, Leaning posteriorly, Decreased weight shift to right Gait velocity: decreased     General Gait Details: Brought pt into the hallway via recliner chair, so she would have a straight away to ambulate. She ambulates with short, slow, labored steps. Pt maintains posterior lean, B knees in flex, RLE staggered infront of LLE. Multi-modal cueing for improved sequencing and technique, with no carryover noted. Assist to manage RW. She quickly fatigued requiring prolonged seated rest breaks. No LOB, but  pt attempting to sit before recliner chair had been moved immediately underneath her.   Stairs             Wheelchair Mobility     Tilt Bed    Modified Rankin (Stroke Patients Only)        Balance Overall balance assessment: Needs assistance Sitting-balance support: Feet supported, Bilateral upper extremity supported Sitting balance-Leahy Scale: Fair Sitting balance - Comments: Pt able to scoot fwd/bkwd in recliner chair with increased time and BUE support. Postural control: Posterior lean (Pt's son reports this was baseline for her secondary to CVA. Caregiver reports this is new.) Standing balance support: Bilateral upper extremity supported, During functional activity, Reliant on assistive device for balance Standing balance-Leahy Scale: Poor Standing balance comment: Pt dependent on RW and min-modA. She requests +2 assist for increased confidence.                            Communication Communication Communication: No apparent difficulties  Cognition Arousal: Alert Behavior During Therapy: Anxious   PT - Cognitive impairments: Sequencing, Initiation, Safety/Judgement                       PT - Cognition Comments: Pt is nervous throughout mobility. She continues to request her caregiver (CNA) assist throughout the session and reports it improves her confidence. As pt fatigues, her fear of falling increases significantly and she appears to become overwhelmed and shuts down to recieving cues and will attempt to sit without warning. Emphasized the importance of communication and discussed safety awareness, with little carryover noted. Following commands: Impaired Following commands impaired: Only follows one step commands consistently, Follows multi-step commands with increased time, Follows one step commands with increased time    Cueing Cueing Techniques: Verbal cues, Tactile cues  Exercises Other Exercises Other Exercises: Reviewed RLE HEP including instruction on hip/knee ROM exercises and how caregiver could assist her in completing these.    General Comments General comments (skin integrity, edema, etc.): Pt wearing bioness on RLE during  session, removed once supine in bed with skin intact underneath. Discussed d/c disposition and caregiver (CNA) voiced concerns about managing pt at home on her own.      Pertinent Vitals/Pain Pain Assessment Pain Assessment: Faces Faces Pain Scale: Hurts even more Pain Location: R Hip Pain Descriptors / Indicators: Discomfort, Guarding, Grimacing Pain Intervention(s): Premedicated before session, Monitored during session, Repositioned    Home Living                          Prior Function            PT Goals (current goals can now be found in the care plan section) Acute Rehab PT Goals Patient Stated Goal: Return Home Progress towards PT goals: Progressing toward goals    Frequency    7X/week      PT Plan      Co-evaluation              AM-PAC PT 6 Clicks Mobility   Outcome Measure  Help needed turning from your back to your side while in a flat bed without using bedrails?: A Little Help needed moving from lying on your back to sitting on the side of a flat bed without using bedrails?: A Little Help needed moving to and from a bed to a chair (including a wheelchair)?: A Little Help needed standing up  from a chair using your arms (e.g., wheelchair or bedside chair)?: A Little Help needed to walk in hospital room?: Total Help needed climbing 3-5 steps with a railing? : Total 6 Click Score: 14    End of Session Equipment Utilized During Treatment: Gait belt Activity Tolerance: Patient tolerated treatment well;Patient limited by fatigue Patient left: in bed;with bed alarm set;with call bell/phone within reach;with family/visitor present Nurse Communication: Mobility status PT Visit Diagnosis: Pain;Difficulty in walking, not elsewhere classified (R26.2);Other abnormalities of gait and mobility (R26.89);Unsteadiness on feet (R26.81) Pain - Right/Left: Right Pain - part of body: Hip     Time: 8646-8572 PT Time Calculation (min) (ACUTE ONLY): 34  min  Charges:    $Gait Training: 23-37 mins PT General Charges $$ ACUTE PT VISIT: 1 Visit                     Randall SAUNDERS, PT, DPT Acute Rehabilitation Services Office: [phone] Secure Chat Preferred  [name] 04/16/2024, 4:14 PM

## 2024-04-16 NOTE — TOC Progression Note (Addendum)
 Transition of Care [facility]) - Progression Note    Patient Details  Name: [name] MRN: [id number] Date of Birth: Oct 12, 1946  [facility] CM/SW Contact  Waddell Barnie Rama, RN Phone Number: 04/16/2024, 9:48 AM  Clinical Narrative:    Per MD note patient is already set up with [facility] for HHPT.  This NCM confirmed with Shylisse. She also needs a bsc,  she has no preference of agency.  NCM made referral to Jermaine with [employer] for bsc.        Expected Discharge Plan and Services                         DME Arranged: Walker rolling DME Agency: [employer]       HH Arranged: PT HH Agency: [facility] ([facility])         Social Determinants of Health (SDOH) Interventions SDOH Screenings   Food Insecurity: No Food Insecurity (04/15/2024)  Housing: Low Risk  (04/15/2024)  Transportation Needs: No Transportation Needs (04/15/2024)  Utilities: Not At Risk (04/15/2024)  Social Connections: Moderately Isolated (04/15/2024)  Tobacco Use: Medium Risk (04/13/2024)    Readmission Risk Interventions     No data to display

## 2024-04-17 LAB — CBC
HCT: 33.2 % — ABNORMAL LOW (ref 36.0–46.0)
Hemoglobin: 10.7 g/dL — ABNORMAL LOW (ref 12.0–15.0)
MCH: 31.3 pg (ref 26.0–34.0)
MCHC: 32.2 g/dL (ref 30.0–36.0)
MCV: 97.1 fL (ref 80.0–100.0)
Platelets: 251 K/uL (ref 150–400)
RBC: 3.42 MIL/uL — ABNORMAL LOW (ref 3.87–5.11)
RDW: 13.2 % (ref 11.5–15.5)
WBC: 9.8 K/uL (ref 4.0–10.5)
nRBC: 0 % (ref 0.0–0.2)

## 2024-04-17 LAB — PROTIME-INR
INR: 1.1 (ref 0.8–1.2)
Prothrombin Time: 15 s (ref 11.4–15.2)

## 2024-04-17 MED ORDER — WARFARIN SODIUM 5 MG PO TABS
5.0000 mg | ORAL_TABLET | Freq: Once | ORAL | Status: AC
  Administered 2024-04-17: 5 mg via ORAL
  Filled 2024-04-17: qty 1

## 2024-04-17 NOTE — Plan of Care (Signed)

## 2024-04-17 NOTE — Progress Notes (Signed)
 PHARMACY - ANTICOAGULATION CONSULT NOTE  Pharmacy Consult for restart home Coumadin  dosing Indication: Factor V Leiden, Lupus anticoagulant positive, long term Coumadin  use (2010 following stroke)  Allergies  Allergen Reactions   Baclofen  Other (See Comments)    felt muscles were weaker.   Codeine Other (See Comments)    hallucination   Duloxetine Hcl Other (See Comments)    Cymbalta*  could not tolerate.terrible shakes, breathing problems.heart race   Influenza Virus Vaccine Other (See Comments)    feverish red raised at site. Takes this every year    Nitrofurantoin  Other (See Comments)    headaches whole time on med, didnt feel well   Zoloft [Sertraline] Other (See Comments)     pt didnt' like how she felt.    Patient Measurements: Height: 5' 4 (162.6 cm) Weight: 86.2 kg (190 lb) IBW/kg (Calculated) : 54.7 HEPARIN DW (KG): 73.7  Vital Signs: Temp: 98.2 F (36.8 C) (07/05 0623) BP: 115/48 (07/05 0623) Pulse Rate: 80 (07/05 0623)  Labs: Recent Labs    04/15/24 0538 04/16/24 0528 04/17/24 0457  HGB 11.7* 11.1* 10.7*  HCT 36.7 35.1* 33.2*  PLT 227 220 251  LABPROT 15.5* 15.6* 15.0  INR 1.2 1.2 1.1  CREATININE  --  0.64  --     Estimated Creatinine Clearance: 61.6 mL/min (by C-G formula based on SCr of 0.64 mg/dL).   Assessment: 29 YOF presents for a R-THR. Pharmacy consulted to restart home warfarin dosing. She sees Technical sales engineer for [employer]. Per note from 03/2024 Start enoxaparin  80mg  every 12 hours on 04/06/24. On 04/14/24 restart warfarin 2.5mg  QD except 5mg  MWF and enoxaparin  80mg  every 12 hours. Overlap for at least 5 days. Recheck INR 04/19/24. If INR <2, continue enoxaparin  until INR >2.  7/4 AM: INR remains subtherapeutic at 1.2 following 2 doses of warfarin per patient's PTA regimen. CBC stable. Lack of rise is not out of the realm of possibility with only 2 doses- if does not increase tomorrow would consider an increased dose. No  major medication changes inpatient that would cause a decreased INR.   PTA regimen: 2.5 mg daily on Sunday, Tuesday, Thursday, and Saturday / 5 mg on Monday, Wednesday, and Friday  Goal of Therapy:  INR 2-3 Monitor platelets by anticoagulation protocol: Yes   Plan:  Coumadin  5 mg po x1 today Lovenox  80 mg SQ q12h until INR >2 F/u s/sx bleeding and CBC daily  Daily INR   Thank you for involving pharmacy in this patient's care.  Benedetta Heath BS, PharmD, BCPS Clinical Pharmacist 04/17/2024 7:27 AM  Contact: [phone] after 3 PM  Be curious, not judgmental... -Davina Sprinkles

## 2024-04-17 NOTE — Progress Notes (Signed)
 Physical Therapy Treatment Patient Details Name: [name] MRN: [id number] DOB: 10-30-1945 Today's Date: 04/17/2024   History of Present Illness [name] is a 78 y.o. female who presented 04/13/24 for elective Rt THA. PMHx: HTN, HLD, CVA, and factor 5 leiden mutation.    PT Comments  Pt greeted supine in bed, pleasant and agreeable to PT session. She largely required CGA-minA for functional mobility. She increased her gait distance ambulating ~62ft using RW with a chair follow. She took a seated rest break and ambulated another ~41ft. Pt is highly motivated to regain her PLOF and d/c home. She maintained upright posture without posterior lean throughout mobility. Pt engaged in a couple of seated LE exercises before requesting to return to bed. Will continue to follow acutely and advance appropriately.      If plan is discharge home, recommend the following: A lot of help with bathing/dressing/bathroom;Assistance with cooking/housework;Assist for transportation;Help with stairs or ramp for entrance;Two people to help with walking and/or transfers   Can travel by private vehicle        Equipment Recommendations  BSC/3in1    Recommendations for Other Services       Precautions / Restrictions Precautions Precautions: Fall Recall of Precautions/Restrictions: Intact Restrictions Weight Bearing Restrictions Per Provider Order: Yes RLE Weight Bearing Per Provider Order: Weight bearing as tolerated     Mobility  Bed Mobility Overal bed mobility: Needs Assistance Bed Mobility: Supine to Sit, Sit to Supine     Supine to sit: HOB elevated, Used rails, Contact guard Sit to supine: Supervision   General bed mobility comments: Pt sat up on L side of bed with increased time and effort. Heavy reliance on bed rail. Assist to elevate trunk and scoot hips fwd. Returning to bed pt brought BLE into bed and repositioned herself in the center.    Transfers Overall transfer level: Needs  assistance Equipment used: Rolling walker (2 wheels) Transfers: Sit to/from Stand Sit to Stand: Min assist           General transfer comment: Pt stood from lowest bed height and recliner chair. Cues for proper hand placement using RW. Powered up with minA. Good eccentric control with sitting.    Ambulation/Gait Ambulation/Gait assistance: Contact guard assist, Min assist, +2 safety/equipment (chair follow) Gait Distance (Feet): 30 Feet (1x30, seated rest, 1x20) Assistive device: Rolling walker (2 wheels) Gait Pattern/deviations: Step-to pattern, Decreased step length - right, Decreased step length - left, Knee flexed in stance - right, Knee flexed in stance - left, Decreased weight shift to right, Antalgic Gait velocity: decreased Gait velocity interpretation: <1.31 ft/sec, indicative of household ambulator   General Gait Details: Pt ambulated with short slow steps. Cues for proximity to RW, upright posture, and hip/knee ext. She continues to take increased time and effort to advance LEs. No LOB. Intermittent assist to manuever RW.   Stairs             Wheelchair Mobility     Tilt Bed    Modified Rankin (Stroke Patients Only)       Balance Overall balance assessment: Needs assistance Sitting-balance support: Feet supported, Single extremity supported Sitting balance-Leahy Scale: Fair Sitting balance - Comments: Pt sat EOB with supervision-CGA.   Standing balance support: Bilateral upper extremity supported, During functional activity, Reliant on assistive device for balance Standing balance-Leahy Scale: Poor Standing balance comment: Pt dependent on RW and CGA-minA.  Communication Communication Communication: No apparent difficulties  Cognition Arousal: Alert Behavior During Therapy: Anxious   PT - Cognitive impairments: Sequencing, Safety/Judgement                         Following commands: Intact       Cueing Cueing Techniques: Verbal cues, Tactile cues  Exercises General Exercises - Lower Extremity Long Arc Quad: Seated, Right, AAROM, 10 reps Hip ABduction/ADduction: Seated, Right, AAROM, 10 reps    General Comments General comments (skin integrity, edema, etc.): Pt wearing bioness and took it off once returned to bed. Spoke to pt's daughter on the phone at the end of the session to detail pt's progress, PT findings, answer questions, and listen to her concerns.      Pertinent Vitals/Pain Pain Assessment Pain Assessment: Faces Faces Pain Scale: Hurts little more Pain Location: R Hip Pain Descriptors / Indicators: Discomfort, Guarding, Grimacing Pain Intervention(s): Premedicated before session, Monitored during session, Limited activity within patient's tolerance    Home Living                          Prior Function            PT Goals (current goals can now be found in the care plan section) Acute Rehab PT Goals Patient Stated Goal: Return Home Progress towards PT goals: Progressing toward goals    Frequency    7X/week      PT Plan      Co-evaluation              AM-PAC PT 6 Clicks Mobility   Outcome Measure  Help needed turning from your back to your side while in a flat bed without using bedrails?: A Little Help needed moving from lying on your back to sitting on the side of a flat bed without using bedrails?: A Little Help needed moving to and from a bed to a chair (including a wheelchair)?: A Little Help needed standing up from a chair using your arms (e.g., wheelchair or bedside chair)?: A Little Help needed to walk in hospital room?: Total Help needed climbing 3-5 steps with a railing? : Total 6 Click Score: 14    End of Session Equipment Utilized During Treatment: Gait belt Activity Tolerance: Patient tolerated treatment well Patient left: in bed;with call bell/phone within reach;with bed alarm set Nurse Communication: Mobility  status;Other (comment) (pt to transfer to/from [facility] and sit up in recliner chair for meals) PT Visit Diagnosis: Pain;Difficulty in walking, not elsewhere classified (R26.2);Other abnormalities of gait and mobility (R26.89);Unsteadiness on feet (R26.81) Pain - Right/Left: Right Pain - part of body: Hip     Time: 0921-1002 PT Time Calculation (min) (ACUTE ONLY): 41 min  Charges:    $Gait Training: 8-22 mins $Therapeutic Exercise: 8-22 mins $Therapeutic Activity: 8-22 mins PT General Charges $$ ACUTE PT VISIT: 1 Visit                     Randall SAUNDERS, PT, DPT Acute Rehabilitation Services Office: [phone] Secure Chat Preferred  Delon CHRISTELLA Callander 04/17/2024, 11:21 AM

## 2024-04-17 NOTE — Progress Notes (Signed)
 Physical Therapy Treatment Patient Details Name: [name] MRN: [id number] DOB: [birth year] Today's Date: 04/17/2024   History of Present Illness [name] is a 78 y.o. female who presented 04/13/24 for elective Rt THA. PMHx: HTN, HLD, CVA, and factor 5 leiden mutation.    PT Comments  Pt progressed mobility ambulating ~10ft twice using RW with CGA and a chair follow. She engaged in transfer training. Pt performed 5 sit<>stands from recliner chair using RW with supervision and one chair>bed transfer using RW with CGA. Cues throughout for sequencing and improved safety awareness. Pt and caregiver have expressed confidence with being able to d/c home with HHPT.       If plan is discharge home, recommend the following: A little help with walking and/or transfers;A little help with bathing/dressing/bathroom;Assistance with cooking/housework;Assist for transportation;Help with stairs or ramp for entrance   Can travel by private vehicle        Equipment Recommendations  BSC/3in1    Recommendations for Other Services       Precautions / Restrictions Precautions Precautions: Fall Recall of Precautions/Restrictions: Intact Restrictions Weight Bearing Restrictions Per Provider Order: Yes RLE Weight Bearing Per Provider Order: Weight bearing as tolerated     Mobility  Bed Mobility Overal bed mobility: Needs Assistance Bed Mobility: Sit to Supine, Rolling Rolling: Used rails, Contact guard assist   Supine to sit: HOB elevated, Used rails, Contact guard Sit to supine: HOB elevated, Used rails, Min assist   General bed mobility comments: Assist to bring RLE back into bed. Pt rolled R/L using bed rail and bridged to reposition herself in the center of the bed.    Transfers Overall transfer level: Needs assistance Equipment used: Rolling walker (2 wheels) Transfers: Sit to/from Stand, Bed to chair/wheelchair/BSC Sit to Stand: Contact guard assist, Supervision   Step pivot  transfers: Contact guard assist       General transfer comment: Pt stood from recliner chair. Cues for proper hand placement. Pt pushed up with LUE on armrest and RUE on RW. No physical assist to power up. CGA-supervision for safety. Good eccentric control with sitting. She transferred from chair>bed by going to the right. Pt side stepped to the right before pivoting to sit on EOB.    Ambulation/Gait Ambulation/Gait assistance: Contact guard assist, +2 safety/equipment (chair follow) Gait Distance (Feet): 30 Feet (x2, seated rest break between bouts) Assistive device: Rolling walker (2 wheels) Gait Pattern/deviations: Step-to pattern, Decreased step length - right, Decreased step length - left, Knee flexed in stance - right, Knee flexed in stance - left, Decreased weight shift to right, Antalgic Gait velocity: decreased Gait velocity interpretation: <1.31 ft/sec, indicative of household ambulator   General Gait Details: Improved upright posture and stability during gait. Pt maintained body inside AD. She has a left-ward drift while manuevering RW secondary to residual RUE weakness from CVA. Pt took slow, small steps with adequate foot clearence.   Stairs             Wheelchair Mobility     Tilt Bed    Modified Rankin (Stroke Patients Only)       Balance Overall balance assessment: Needs assistance Sitting-balance support: Feet supported, Single extremity supported Sitting balance-Leahy Scale: Fair Sitting balance - Comments: Pt sat EOB with supervision-CGA.   Standing balance support: Bilateral upper extremity supported, During functional activity, Reliant on assistive device for balance Standing balance-Leahy Scale: Poor Standing balance comment: Pt dependent on RW and CGA-minA.  Communication Communication Communication: No apparent difficulties  Cognition Arousal: Alert Behavior During Therapy: Anxious   PT - Cognitive  impairments: Sequencing, Safety/Judgement                       PT - Cognition Comments: As pt fatigues her fear of falling and nervousness to mobilize significantly raises. She becomes tachypneic and requires re-direction to increase safety awarness. Following commands: Intact      Cueing Cueing Techniques: Verbal cues, Tactile cues  Exercises General Exercises - Lower Extremity Long Arc Quad: Seated, Right, AAROM, 10 reps Hip ABduction/ADduction: Seated, Right, AAROM, 10 reps    General Comments General comments (skin integrity, edema, etc.): Pt wearing bioness and took it off once returned to bed. Spoke to pt's daughter on the phone at the end of the session to detail pt's progress, PT findings, answer questions, and listen to her concerns.      Pertinent Vitals/Pain Pain Assessment Pain Assessment: Faces Faces Pain Scale: Hurts little more Pain Location: R Hip Pain Descriptors / Indicators: Discomfort, Guarding Pain Intervention(s): Premedicated before session, Monitored during session, Limited activity within patient's tolerance    Home Living                          Prior Function            PT Goals (current goals can now be found in the care plan section) Acute Rehab PT Goals Patient Stated Goal: Return Home Progress towards PT goals: Progressing toward goals    Frequency    7X/week      PT Plan      Co-evaluation              AM-PAC PT 6 Clicks Mobility   Outcome Measure  Help needed turning from your back to your side while in a flat bed without using bedrails?: A Little Help needed moving from lying on your back to sitting on the side of a flat bed without using bedrails?: A Little Help needed moving to and from a bed to a chair (including a wheelchair)?: A Little Help needed standing up from a chair using your arms (e.g., wheelchair or bedside chair)?: A Little Help needed to walk in hospital room?: A Little Help needed  climbing 3-5 steps with a railing? : A Lot 6 Click Score: 17    End of Session Equipment Utilized During Treatment: Gait belt Activity Tolerance: Patient tolerated treatment well Patient left: in bed;with call bell/phone within reach;with family/visitor present Nurse Communication: Mobility status PT Visit Diagnosis: Pain;Difficulty in walking, not elsewhere classified (R26.2);Other abnormalities of gait and mobility (R26.89);Unsteadiness on feet (R26.81) Pain - Right/Left: Right Pain - part of body: Hip     Time: 8752-8677 PT Time Calculation (min) (ACUTE ONLY): 35 min  Charges:    $Gait Training: 8-22 mins $Therapeutic Exercise: 8-22 mins $Therapeutic Activity: 8-22 mins PT General Charges $$ ACUTE PT VISIT: 1 Visit                     Randall SAUNDERS, PT, DPT Acute Rehabilitation Services Office: [phone] Secure Chat Preferred  Delon CHRISTELLA Callander 04/17/2024, 1:35 PM

## 2024-04-17 NOTE — Progress Notes (Signed)
 4 Days Post-Op Procedure(s) (LRB): RIGHT TOTAL HIP REPLACEMENT, POSTERIOR APPROACH (Right)  Subjective: Patient is alert, oriented. Still moderate pain today.  Walked 56ft x4 and able to stand with PT yesterday. Still recommendation of SNF though patient very hesitant, would prefer dispo home with HHPT/HHOT.  Does not want pain meds increased at this time. Unsure whether she's able to go home yet.  No other complaints.   Objective:  PE: VITALS:   Vitals:   04/16/24 1514 04/16/24 2154 04/17/24 0623 04/17/24 0740  BP: (!) 111/54 132/61 (!) 115/48 (!) 114/55  Pulse: 84 80 80 73  Resp: 14 17 18 15   Temp: 98.3 F (36.8 C) 98.3 F (36.8 C) 98.2 F (36.8 C) 98 F (36.7 C)  TempSrc: Oral   Oral  SpO2: 91% 93% 95% 95%  Weight:      Height:       Laying in bed in no acute distress Normal respiratory effort Sensation to foot at baseline Incision CDI Performs 40 deg active FF at the hip  Flicker of movement at right great toe, no ability to dorsfilex though this is normal for patient. Hx of foot drop   LABS  Results for orders placed or performed during the hospital encounter of 04/13/24 (from the past 24 hours)  Protime-INR     Status: None   Collection Time: 04/17/24  4:57 AM  Result Value Ref Range   Prothrombin Time 15.0 11.4 - 15.2 seconds   INR 1.1 0.8 - 1.2  CBC     Status: Abnormal   Collection Time: 04/17/24  4:57 AM  Result Value Ref Range   WBC 9.8 4.0 - 10.5 K/uL   RBC 3.42 (L) 3.87 - 5.11 MIL/uL   Hemoglobin 10.7 (L) 12.0 - 15.0 g/dL   HCT 66.7 (L) 63.9 - 53.9 %   MCV 97.1 80.0 - 100.0 fL   MCH 31.3 26.0 - 34.0 pg   MCHC 32.2 30.0 - 36.0 g/dL   RDW 86.7 88.4 - 84.4 %   Platelets 251 150 - 400 K/uL   nRBC 0.0 0.0 - 0.2 %    No results found.   Assessment/Plan: Principal Problem:   S/P total right hip arthroplasty  4 Days Post-Op s/p Procedure(s): RIGHT TOTAL HIP REPLACEMENT, POSTERIOR APPROACH  7/3: Low grade fever overnight with SpO2 dropping  into high 80's reordered incentive spirometry as it doesn't look like she has one in the room, I have contacted nurses regarding getting this for her and teaching her how to use it  7/4: Low grade fever again over night to 100.3 per friend at bedside, since come down.  Pt would like to become more mobile prior to dispo, strongly prefers dispo home as opposed to SNF.  Continue mobilizing with PT/OT.  7/5: Patient withouth major changes today.  PT still recommendation of likely SNF.  Patient would like to improve further here and then go home with HHPT.  This was discussed with [employer] previously.  Continue working diligently with PT/OT.   Weightbearing: WBAT RLE, up with therapy. Of note, patient had multiple vaso vagal spells when trying to ambulate after her left  hip replacement.  Insicional and dressing care: Reinforce dressings as needed Orthopedic device(s): patient will need to have on Bioness device prior to ambulating VTE prophylaxis: Bridging back to coumadin  with lovenox  per plan from coumadin  clinic Pain control: Will decrease hydrocodone  and tramadol  Follow - up plan: 2 weeks with Dr. Josefina Dispo: pending progress  with PT. Patient would like to discharge home today, we just need to make sure she is safe from a mobility standpoint  Face to Face and HH orders placed. Patient already set up for HHPT with [facility] at discharge.    Contact information:   Bernarda Mclean, PA-C [facility] 8-5  After hours and holidays please check [email or web address] for group call information for [facility]  Bernarda CHRISTELLA Mclean 04/17/2024, 8:22 AM

## 2024-04-18 LAB — CBC
HCT: 32.8 % — ABNORMAL LOW (ref 36.0–46.0)
Hemoglobin: 10.8 g/dL — ABNORMAL LOW (ref 12.0–15.0)
MCH: 32.2 pg (ref 26.0–34.0)
MCHC: 32.9 g/dL (ref 30.0–36.0)
MCV: 97.9 fL (ref 80.0–100.0)
Platelets: 292 K/uL (ref 150–400)
RBC: 3.35 MIL/uL — ABNORMAL LOW (ref 3.87–5.11)
RDW: 13.2 % (ref 11.5–15.5)
WBC: 9.5 K/uL (ref 4.0–10.5)
nRBC: 0 % (ref 0.0–0.2)

## 2024-04-18 LAB — PROTIME-INR
INR: 1.3 — ABNORMAL HIGH (ref 0.8–1.2)
Prothrombin Time: 16.6 s — ABNORMAL HIGH (ref 11.4–15.2)

## 2024-04-18 MED ORDER — WARFARIN SODIUM 3 MG PO TABS
3.0000 mg | ORAL_TABLET | Freq: Once | ORAL | Status: AC
  Administered 2024-04-18: 3 mg via ORAL
  Filled 2024-04-18: qty 1

## 2024-04-18 NOTE — Progress Notes (Signed)
 PHARMACY - ANTICOAGULATION CONSULT NOTE  Pharmacy Consult for restart home Coumadin  dosing Indication: Factor V Leiden, Lupus anticoagulant positive, long term Coumadin  use (2010 following stroke)  Allergies  Allergen Reactions   Baclofen  Other (See Comments)    felt muscles were weaker.   Codeine Other (See Comments)    hallucination   Duloxetine Hcl Other (See Comments)    Cymbalta*  could not tolerate.terrible shakes, breathing problems.heart race   Influenza Virus Vaccine Other (See Comments)    feverish red raised at site. Takes this every year    Nitrofurantoin  Other (See Comments)    headaches whole time on med, didnt feel well   Zoloft [Sertraline] Other (See Comments)     pt didnt' like how she felt.    Patient Measurements: Height: 5' 4 (162.6 cm) Weight: 86.2 kg (190 lb) IBW/kg (Calculated) : 54.7 HEPARIN DW (KG): 73.7  Vital Signs: Temp: 97.4 F (36.3 C) (07/06 0507) Temp Source: Oral (07/06 0507) BP: 140/70 (07/06 0507) Pulse Rate: 80 (07/06 0507)  Labs: Recent Labs    04/16/24 0528 04/17/24 0457 04/18/24 0452  HGB 11.1* 10.7* 10.8*  HCT 35.1* 33.2* 32.8*  PLT 220 251 292  LABPROT 15.6* 15.0 16.6*  INR 1.2 1.1 1.3*  CREATININE 0.64  --   --     Estimated Creatinine Clearance: 61.6 mL/min (by C-G formula based on SCr of 0.64 mg/dL).   Assessment: 4 YOF presents for a R-THR. Pharmacy consulted to restart home warfarin dosing. She sees Technical sales engineer for [employer]. Per note from 03/2024 Start enoxaparin  80mg  every 12 hours on 04/06/24. On 04/14/24 restart warfarin 2.5mg  QD except 5mg  MWF and enoxaparin  80mg  every 12 hours. Overlap for at least 5 days. Recheck INR 04/19/24. If INR <2, continue enoxaparin  until INR >2.  7/4 AM: INR remains subtherapeutic at 1.2 following 2 doses of warfarin per patient's PTA regimen. CBC stable. Lack of rise is not out of the realm of possibility with only 2 doses- if does not increase tomorrow would  consider an increased dose. No major medication changes inpatient that would cause a decreased INR.   PTA regimen: 2.5 mg daily on Sunday, Tuesday, Thursday, and Saturday / 5 mg on Monday, Wednesday, and Friday  Goal of Therapy:  INR 2-3 Monitor platelets by anticoagulation protocol: Yes   Plan:  Coumadin  3 mg po x1 today Lovenox  80 mg SQ q12h until INR >2 F/u s/sx bleeding and CBC daily  Daily INR   Thank you for involving pharmacy in this patient's care.  Benedetta Heath BS, PharmD, BCPS Clinical Pharmacist 04/18/2024 7:40 AM  Contact: [phone] after 3 PM  Be curious, not judgmental... -Davina Sprinkles

## 2024-04-18 NOTE — Progress Notes (Signed)
 5 Days Post-Op Procedure(s) (LRB): RIGHT TOTAL HIP REPLACEMENT, POSTERIOR APPROACH (Right)  Subjective: Patient is alert, oriented. Still moderate pain today.    Had a few episodes of diarrhea last night and some increased urinary frequency, now feels quite tired.  Looking forward to breakfast.  No abdominal pain or dysuria. No fevers overnight.  Walked 30+20+30+64ft with RW with PT yesterday.  Still seems to be recommended SNF though patient very hesitant, would prefer dispo home with HHPT/HHOT.  Does not want pain meds increased at this time.   No chest pain, shortness of breath, or other complaints.   Objective:  PE: VITALS:   Vitals:   04/17/24 0740 04/17/24 1705 04/17/24 2207 04/18/24 0507  BP: (!) 114/55 (!) 128/49 122/62 (!) 140/70  Pulse: 73 80 86 80  Resp: 15 16 16 16   Temp: 98 F (36.7 C) 99 F (37.2 C) 99 F (37.2 C) (!) 97.4 F (36.3 C)  TempSrc: Oral Oral Oral Oral  SpO2: 95% 96% 95% 92%  Weight:      Height:        Laying in bed in no acute distress Normal respiratory effort Sensation to foot at baseline Incision CDI Performs 40 deg active FF at the hip  Flicker of movement at right great toe, no ability to dorsfilex though this is normal for patient. Hx of foot drop    LABS  Results for orders placed or performed during the hospital encounter of 04/13/24 (from the past 24 hours)  Protime-INR     Status: Abnormal   Collection Time: 04/18/24  4:52 AM  Result Value Ref Range   Prothrombin Time 16.6 (H) 11.4 - 15.2 seconds   INR 1.3 (H) 0.8 - 1.2  CBC     Status: Abnormal   Collection Time: 04/18/24  4:52 AM  Result Value Ref Range   WBC 9.5 4.0 - 10.5 K/uL   RBC 3.35 (L) 3.87 - 5.11 MIL/uL   Hemoglobin 10.8 (L) 12.0 - 15.0 g/dL   HCT 67.1 (L) 63.9 - 53.9 %   MCV 97.9 80.0 - 100.0 fL   MCH 32.2 26.0 - 34.0 pg   MCHC 32.9 30.0 - 36.0 g/dL   RDW 86.7 88.4 - 84.4 %   Platelets 292 150 - 400 K/uL   nRBC 0.0 0.0 - 0.2 %    No results  found.   Assessment/Plan: Principal Problem:   S/P total right hip arthroplasty  5 Days Post-Op s/p Procedure(s): RIGHT TOTAL HIP REPLACEMENT, POSTERIOR APPROACH  7/3: Low grade fever overnight with SpO2 dropping into high 80's reordered incentive spirometry as it doesn't look like she has one in the room, I have contacted nurses regarding getting this for her and teaching her how to use it  7/4: Low grade fever again over night to 100.3 per friend at bedside, since come down.  Pt would like to become more mobile prior to dispo, strongly prefers dispo home as opposed to SNF.  Continue mobilizing with PT/OT.  7/5: Patient withouth major changes today.  PT still recommendation of likely SNF.  Patient would like to improve further here and then go home with HHPT.  This was discussed with [employer] previously.  Continue working diligently with PT/OT.  7/6: Notable improvement with mobility from PT notes yesterday.  No diarrhea since last night and no dysuria or abdominal pain.  HDS, afebrile.  Encouraged oral hydration, keeping up with nutritional intake.  Can consider PRN therapy for diarrhea as  needed.  Pt still hoping for dispo home with HHPT/OT over SNF.   Weightbearing: WBAT RLE, up with therapy. Of note, patient had multiple vaso vagal spells when trying to ambulate after her left  hip replacement.  Insicional and dressing care: Reinforce dressings as needed Orthopedic device(s): patient will need to have on Bioness device prior to ambulating VTE prophylaxis: Bridging back to coumadin  with lovenox  per plan from coumadin  clinic Pain control: Will decrease hydrocodone  and tramadol  Follow - up plan: 2 weeks with Dr. Josefina Dispo: pending progress with PT. Patient would like to discharge home today, we just need to make sure she is safe from a mobility standpoint  Face to Face and HH orders placed. Patient already set up for HHPT with [facility] at discharge.    Contact information:    Bernarda Mclean, PA-C [facility] 8-5  After hours and holidays please check [email or web address] for group call information for [facility]  Bernarda CHRISTELLA Mclean 04/18/2024, 7:48 AM

## 2024-04-18 NOTE — Progress Notes (Signed)
 Physical Therapy Treatment Patient Details Name: [name] MRN: [id number] DOB: 03-Mar-1946 Today's Date: 04/18/2024   History of Present Illness [name] is a 78 y.o. female who presented 04/13/24 for elective Rt THA. PMHx: HTN, HLD, CVA, and factor 5 leiden mutation.    PT Comments  PT session limited by fatigue d/t a rough night and reduced PO intake. She required CGA-minA for OOB mobility using RW. Pt demonstrated ability to sit up from a flat bed without physical assist, but heavy reliance on bed rail. She ambulated a short-distance with a close chair follow and quickly fatigued. Pt c/o shakiness with mild B hand (R>L) tremors noted during seated rest. Will continue to follow acutely and advance appropriately.     If plan is discharge home, recommend the following: A little help with bathing/dressing/bathroom;Assistance with cooking/housework;Assist for transportation;Help with stairs or ramp for entrance;A lot of help with walking and/or transfers   Can travel by private vehicle        Equipment Recommendations  BSC/3in1    Recommendations for Other Services       Precautions / Restrictions Precautions Precautions: Fall Recall of Precautions/Restrictions: Intact Restrictions Weight Bearing Restrictions Per Provider Order: Yes RLE Weight Bearing Per Provider Order: Weight bearing as tolerated     Mobility  Bed Mobility Overal bed mobility: Needs Assistance Bed Mobility: Supine to Sit Rolling: Used rails, Supervision         General bed mobility comments: Pt sat up from a flat bed to simulate home environment. Increase time and heavy reliance on bed rail. No physical assist or cues for sequencing.    Transfers Overall transfer level: Needs assistance Equipment used: Rolling walker (2 wheels) Transfers: Sit to/from Stand, Bed to chair/wheelchair/BSC Sit to Stand: Contact guard assist   Step pivot transfers: Contact guard assist       General transfer  comment: Pt stood from lowest bed height. She demonstrated proper hand placement on RW. Powered up without physical assist, CGA for safety/stability. Good eccentric control.    Ambulation/Gait Ambulation/Gait assistance: Contact guard assist, Min assist, +2 safety/equipment (close chair follow) Gait Distance (Feet): 20 Feet (1x20, prolonged seated rest, 1x5) Assistive device: Rolling walker (2 wheels) Gait Pattern/deviations: Step-to pattern, Decreased step length - right, Decreased step length - left, Knee flexed in stance - right, Knee flexed in stance - left, Decreased weight shift to right, Antalgic Gait velocity: decreased Gait velocity interpretation: <1.31 ft/sec, indicative of household ambulator   General Gait Details: Pt quickly fatigued during mobility attempts and c/o shakiness. Once seated observed mild tremor is B hands. Pt drank water  and ate part of a protein bar. She was able to take small steps. Assist to stabilize trunk and manuever RW.   Stairs             Wheelchair Mobility     Tilt Bed    Modified Rankin (Stroke Patients Only)       Balance Overall balance assessment: Needs assistance Sitting-balance support: Feet supported, Single extremity supported, Bilateral upper extremity supported Sitting balance-Leahy Scale: Fair Sitting balance - Comments: Pt sat EOB and scooted fwd with unilat/bilat UE support.   Standing balance support: Bilateral upper extremity supported, During functional activity, Reliant on assistive device for balance Standing balance-Leahy Scale: Poor Standing balance comment: Pt dependent on RW, CGA-minA and a close chair follow.  Communication Communication Communication: No apparent difficulties  Cognition Arousal: Alert Behavior During Therapy: Anxious   PT - Cognitive impairments: Sequencing, Safety/Judgement                       PT - Cognition Comments: Pt nervous to  mobilize after a rough night. Her fear of falling has increased. Pt becomes tachypneic during mobility, cue for PLB technique, and to focus on the task at hand. Following commands: Intact      Cueing Cueing Techniques: Verbal cues, Tactile cues  Exercises      General Comments General comments (skin integrity, edema, etc.): Pt wearing bioness on RLE. VSS on RA.      Pertinent Vitals/Pain Pain Assessment Pain Assessment: Faces Faces Pain Scale: Hurts a little bit Pain Location: R Hip Pain Descriptors / Indicators: Discomfort, Guarding, Aching Pain Intervention(s): Monitored during session, Repositioned    Home Living                          Prior Function            PT Goals (current goals can now be found in the care plan section) Acute Rehab PT Goals Patient Stated Goal: Return Home Progress towards PT goals: Progressing toward goals    Frequency    7X/week      PT Plan      Co-evaluation              AM-PAC PT 6 Clicks Mobility   Outcome Measure  Help needed turning from your back to your side while in a flat bed without using bedrails?: A Little Help needed moving from lying on your back to sitting on the side of a flat bed without using bedrails?: A Little Help needed moving to and from a bed to a chair (including a wheelchair)?: A Little Help needed standing up from a chair using your arms (e.g., wheelchair or bedside chair)?: A Little Help needed to walk in hospital room?: A Little Help needed climbing 3-5 steps with a railing? : A Lot 6 Click Score: 17    End of Session Equipment Utilized During Treatment: Gait belt Activity Tolerance: Patient limited by fatigue Patient left: in chair;with call bell/phone within reach;with chair alarm set Nurse Communication: Mobility status PT Visit Diagnosis: Pain;Difficulty in walking, not elsewhere classified (R26.2);Other abnormalities of gait and mobility (R26.89);Unsteadiness on feet  (R26.81) Pain - Right/Left: Right Pain - part of body: Hip     Time: 8691-8661 PT Time Calculation (min) (ACUTE ONLY): 30 min  Charges:    $Gait Training: 8-22 mins $Therapeutic Activity: 8-22 mins PT General Charges $$ ACUTE PT VISIT: 1 Visit                     Randall SAUNDERS, PT, DPT Acute Rehabilitation Services Office: [phone] Secure Chat Preferred  [name] 04/18/2024, 3:41 PM

## 2024-04-19 ENCOUNTER — Ambulatory Visit: Admitting: Student

## 2024-04-19 LAB — CBC
HCT: 35.2 % — ABNORMAL LOW (ref 36.0–46.0)
Hemoglobin: 11 g/dL — ABNORMAL LOW (ref 12.0–15.0)
MCH: 31.3 pg (ref 26.0–34.0)
MCHC: 31.3 g/dL (ref 30.0–36.0)
MCV: 100 fL (ref 80.0–100.0)
Platelets: 345 K/uL (ref 150–400)
RBC: 3.52 MIL/uL — ABNORMAL LOW (ref 3.87–5.11)
RDW: 13.5 % (ref 11.5–15.5)
WBC: 8.9 K/uL (ref 4.0–10.5)
nRBC: 0.2 % (ref 0.0–0.2)

## 2024-04-19 LAB — URINALYSIS, ROUTINE W REFLEX MICROSCOPIC
Bilirubin Urine: NEGATIVE
Glucose, UA: NEGATIVE mg/dL
Ketones, ur: NEGATIVE mg/dL
Nitrite: POSITIVE — AB
Protein, ur: NEGATIVE mg/dL
Specific Gravity, Urine: 1.009 (ref 1.005–1.030)
pH: 7 (ref 5.0–8.0)

## 2024-04-19 LAB — PROTIME-INR
INR: 1.3 — ABNORMAL HIGH (ref 0.8–1.2)
Prothrombin Time: 16.4 s — ABNORMAL HIGH (ref 11.4–15.2)

## 2024-04-19 MED ORDER — CEPHALEXIN 250 MG PO CAPS
250.0000 mg | ORAL_CAPSULE | Freq: Four times a day (QID) | ORAL | Status: DC
  Administered 2024-04-19 – 2024-04-20 (×3): 250 mg via ORAL
  Filled 2024-04-19 (×4): qty 1

## 2024-04-19 MED ORDER — WARFARIN SODIUM 7.5 MG PO TABS
7.5000 mg | ORAL_TABLET | Freq: Once | ORAL | Status: AC
  Administered 2024-04-19: 7.5 mg via ORAL
  Filled 2024-04-19: qty 1

## 2024-04-19 MED ORDER — SODIUM CHLORIDE 0.9 % IV SOLN
1.0000 g | Freq: Once | INTRAVENOUS | Status: AC
  Administered 2024-04-19: 1 g via INTRAVENOUS
  Filled 2024-04-19: qty 10

## 2024-04-19 NOTE — Progress Notes (Signed)
 Physical Therapy Treatment Patient Details Name: [name] MRN: [id number] DOB: 01-25-1946 Today's Date: 04/19/2024   History of Present Illness [name] is a 79 y.o. female who presented 04/13/24 for elective Rt THA. PMHx: HTN, HLD, CVA, and factor 5 leiden mutation.    PT Comments  Pt supine in bed on arrival.  Pt very concerned about her UTI and need for UA.  Pt having difficulty this session pair bioness to her phone but eventually able to and performed short bout of gt limited but LOB to the L after B UE tremorts and taking hand of RW to reach for furniture.  Pt is at a high risk for falls and continues to benefit from PT post operatively. Follow surgeons directions for d/c disposition.     If plan is discharge home, recommend the following: A little help with bathing/dressing/bathroom;Assistance with cooking/housework;Assist for transportation;Help with stairs or ramp for entrance;A lot of help with walking and/or transfers   Can travel by private vehicle        Equipment Recommendations  BSC/3in1    Recommendations for Other Services       Precautions / Restrictions Precautions Precautions: Fall Recall of Precautions/Restrictions: Intact Precaution/Restrictions Comments: deficits from old CVA affecting her R side. Required Braces or Orthoses: Other Brace Other Brace: R LE bioness for DF. Restrictions Weight Bearing Restrictions Per Provider Order: Yes RLE Weight Bearing Per Provider Order: Weight bearing as tolerated     Mobility  Bed Mobility   Bed Mobility: Supine to Sit Rolling: Used rails, Supervision   Supine to sit: HOB elevated, Used rails, Supervision     General bed mobility comments: Increased time and effort but no physical assistance needed.    Transfers Overall transfer level: Needs assistance Equipment used: Rolling walker (2 wheels) Transfers: Sit to/from Stand Sit to Stand: Min assist           General transfer comment: Cues for  hand placement to push from seated surface, poor eccentric load back to surface of recliner.    Ambulation/Gait Ambulation/Gait assistance: Min assist, Mod assist Gait Distance (Feet): 6 Feet Assistive device: Rolling walker (2 wheels) Gait Pattern/deviations: Step-to pattern, Decreased step length - right, Decreased step length - left, Knee flexed in stance - right, Knee flexed in stance - left, Decreased weight shift to right, Antalgic, Trunk flexed, Shuffle Gait velocity: decreased     General Gait Details: Pt quickly fatigued during mobility attempts and c/o shakiness. UE tremoring noted with LOB to the L and patient letting go of RW to grab the handle on the drawer below her closet.  Pt required mod assistance to correct LOB to the L.  Once in standing, upright, and balance instructed patient in pursed lip breathing.  Brought chair up to patient after LOB and increased fatigue on patient's behalf.   Stairs             Wheelchair Mobility     Tilt Bed    Modified Rankin (Stroke Patients Only)       Balance Overall balance assessment: Needs assistance Sitting-balance support: Feet supported, Single extremity supported, Bilateral upper extremity supported Sitting balance-Leahy Scale: Fair       Standing balance-Leahy Scale: Poor Standing balance comment: LOB left during gt training.                            Communication Communication Communication: No apparent difficulties  Cognition Arousal: Alert Behavior  During Therapy: Anxious   PT - Cognitive impairments: Sequencing, Safety/Judgement                       PT - Cognition Comments: Pt nervous to mobilize after a rough night. Her fear of falling has increased. Pt becomes tachypneic during mobility, cue for PLB technique, and to focus on the task at hand. Following commands: Intact Following commands impaired: Only follows one step commands consistently, Follows multi-step commands  with increased time, Follows one step commands with increased time    Cueing Cueing Techniques: Verbal cues, Tactile cues  Exercises General Exercises - Lower Extremity Heel Slides: Right, 10 reps, Supine, AAROM Hip ABduction/ADduction: Right, AAROM, 10 reps, Supine    General Comments        Pertinent Vitals/Pain Pain Assessment Pain Assessment: Faces Faces Pain Scale: Hurts whole lot Pain Location: R Hip Pain Descriptors / Indicators: Discomfort, Guarding, Aching Pain Intervention(s): Monitored during session, Repositioned    Home Living                          Prior Function            PT Goals (current goals can now be found in the care plan section) Acute Rehab PT Goals Patient Stated Goal: Return Home Potential to Achieve Goals: Fair Progress towards PT goals: Progressing toward goals    Frequency    7X/week      PT Plan      Co-evaluation              AM-PAC PT 6 Clicks Mobility   Outcome Measure  Help needed turning from your back to your side while in a flat bed without using bedrails?: A Little Help needed moving from lying on your back to sitting on the side of a flat bed without using bedrails?: A Little Help needed moving to and from a bed to a chair (including a wheelchair)?: A Little Help needed standing up from a chair using your arms (e.g., wheelchair or bedside chair)?: A Little Help needed to walk in hospital room?: A Lot Help needed climbing 3-5 steps with a railing? : A Lot 6 Click Score: 16    End of Session Equipment Utilized During Treatment: Gait belt Activity Tolerance: Patient limited by fatigue Patient left: in chair;with call bell/phone within reach;with chair alarm set Nurse Communication: Mobility status (informed nurse of +2 assistance and use of gt belt for back to bed.) PT Visit Diagnosis: Pain;Difficulty in walking, not elsewhere classified (R26.2);Other abnormalities of gait and mobility  (R26.89);Unsteadiness on feet (R26.81) Pain - Right/Left: Right Pain - part of body: Hip     Time: 1215-1249 PT Time Calculation (min) (ACUTE ONLY): 34 min  Charges:    $Gait Training: 8-22 mins $Therapeutic Activity: 8-22 mins PT General Charges $$ ACUTE PT VISIT: 1 Visit                     [name] , PTA Acute Rehabilitation Services Office [phone]    [name] 04/19/2024, 1:20 PM

## 2024-04-19 NOTE — Progress Notes (Signed)
 Subjective: 6 Days Post-Op s/p Procedure(s): RIGHT TOTAL HIP REPLACEMENT, POSTERIOR APPROACH   Patient is alert, oriented. Frustrated, feels like she's been sitting in urine for days. Nursing staff has been doing their best to change her as often as possible. States that UTIs present as urinary incontinence for her and she would like an antibiotic to stop this quickly. History of frequent UTIs in the past. Otherwise feels like she's doing better. Diarrhea has resolved. Now she is trying to eat more. Pain so far well controlled.  Objective:  PE: VITALS:   Vitals:   04/18/24 1604 04/18/24 2010 04/19/24 0543 04/19/24 0743  BP: (!) 118/50 139/66 138/60 (!) 130/57  Pulse: 74 82 72 71  Resp: 16 18 17 16   Temp: 99.1 F (37.3 C) 99.3 F (37.4 C) 98 F (36.7 C) 98.1 F (36.7 C)  TempSrc: Oral Oral Oral   SpO2: 97% 92% 99% 97%  Weight:      Height:       Expand All Collapse All         Subjective: 1 Day Post-Op s/p Procedure(s): RIGHT TOTAL HIP REPLACEMENT, POSTERIOR APPROACH    Patient is alert, oriented. Patient reports pain as well controlled right now, though was very severe in PACU. Having some itching this morning. No other complaints.     Objective:  PE: VITALS:         Vitals:    04/13/24 1820 04/13/24 2041 04/14/24 0452 04/14/24 0812  BP: (!) 156/72   127/63 (!) 113/56  Pulse: 87   74 70  Resp: 14 18 18 15   Temp: (!) 97.5 F (36.4 C)   98.3 F (36.8 C) 97.9 F (36.6 C)  TempSrc:   Oral Oral Oral  SpO2: 92%   92% 94%  Weight:          Height:            Laying in bed in no acute distress Normal respiratory effort Sensation to foot at baseline Incision open to the air, sounds like mepilex was taken off in last linen change. Skin cleaned and new mepilex dressing placed.  Performs 40 deg active FF at the hip  Flicker of movement at right great toe, no ability to dorsfilex though this is normal for patient. Hx of foot drop     LABS  Results for  orders placed or performed during the hospital encounter of 04/13/24 (from the past 24 hours)  Protime-INR     Status: Abnormal   Collection Time: 04/19/24  5:00 AM  Result Value Ref Range   Prothrombin Time 16.4 (H) 11.4 - 15.2 seconds   INR 1.3 (H) 0.8 - 1.2  CBC     Status: Abnormal   Collection Time: 04/19/24  5:00 AM  Result Value Ref Range   WBC 8.9 4.0 - 10.5 K/uL   RBC 3.52 (L) 3.87 - 5.11 MIL/uL   Hemoglobin 11.0 (L) 12.0 - 15.0 g/dL   HCT 64.7 (L) 63.9 - 53.9 %   MCV 100.0 80.0 - 100.0 fL   MCH 31.3 26.0 - 34.0 pg   MCHC 31.3 30.0 - 36.0 g/dL   RDW 86.4 88.4 - 84.4 %   Platelets 345 150 - 400 K/uL   nRBC 0.2 0.0 - 0.2 %    No results found.  Assessment/Plan: Principal Problem:   S/P total right hip arthroplasty  6 Days Post-Op s/p Procedure(s): RIGHT TOTAL HIP REPLACEMENT, POSTERIOR APPROACH  Patient feels like she  has a UTI. I will do some chart review to see what she has been treated with in the past. Urine culture has been taken this morning and sent.   Weightbearing: WBAT RLE, up with PT Insicional and dressing care: Reinforce dressings as needed VTE prophylaxis: Bridging back to coumadin  with lovenox  per pharmacy Pain control: continue current regimen Follow - up plan: 2 weeks with Dr. Josefina Dispo: pending passing Stair training with PT.    Contact information:   Army Daring, DEVONNA Tzzxijbd 8-5  After hours and holidays please check [email or web address] for group call information for [facility]  Army MARLA Daring 04/19/2024, 8:30 AM

## 2024-04-19 NOTE — Progress Notes (Signed)
 Physical Therapy Treatment Patient Details Name: [name] MRN: [id number] DOB: [birth year] Today's Date: 04/19/2024   History of Present Illness [name] is a 78 y.o. female who presented 04/13/24 for elective Rt THA. PMHx: HTN, HLD, CVA, and factor 5 leiden mutation.    PT Comments  Patient able to progress ambulation this session despite unable to use her Bioness for Rt ankle DF during gait. She ambulated 40 ft, seated rest, 30 ft with RW and CGA. She reports level entry into home. Her personal aide was present during session and reports pt is walking better than she was prior to surgery. Neither of them has any concerns with discharging home. Patient returned to bed per her request. Continue to recommend following surgeon's directions for discharge plan for therapies.     If plan is discharge home, recommend the following: A little help with bathing/dressing/bathroom;Assistance with cooking/housework;Assist for transportation;Help with stairs or ramp for entrance;A little help with walking and/or transfers   Can travel by private vehicle        Equipment Recommendations  BSC/3in1    Recommendations for Other Services       Precautions / Restrictions Precautions Precautions: Fall Recall of Precautions/Restrictions: Intact Precaution/Restrictions Comments: deficits from old CVA affecting her R side. Required Braces or Orthoses: Other Brace Other Brace: R LE bioness for DF. Restrictions Weight Bearing Restrictions Per Provider Order: Yes RLE Weight Bearing Per Provider Order: Weight bearing as tolerated     Mobility  Bed Mobility Overal bed mobility: Needs Assistance Bed Mobility: Supine to Sit, Sit to Supine Rolling: Used rails, Supervision   Supine to sit: HOB elevated, Used rails, Supervision Sit to supine: HOB elevated, Used rails, Supervision   General bed mobility comments: Increased time and effort but no physical assistance needed.    Transfers Overall  transfer level: Needs assistance Equipment used: Rolling walker (2 wheels) Transfers: Sit to/from Stand, Bed to chair/wheelchair/BSC Sit to Stand: Contact guard assist   Step pivot transfers: Contact guard assist       General transfer comment: Cues for hand placement to push from seated surface and to reach back to surface; poor eccentric load back to surface of recliner.    Ambulation/Gait Ambulation/Gait assistance: Contact guard assist, +2 safety/equipment Gait Distance (Feet): 40 Feet (seated rest, 30) Assistive device: Rolling walker (2 wheels) Gait Pattern/deviations: Step-to pattern, Decreased step length - right, Decreased step length - left, Knee flexed in stance - right, Knee flexed in stance - left, Decreased weight shift to right, Shuffle Gait velocity: decreased     General Gait Details: Pt unable to get bioness for R ankle DF to sync and work. She states she walks at home alot of the time without it and chose to do so now. Initial ~5 ft with poor Rt foot clearance (nearly dragging) that improved after 5 ft. Her personal aide present and followed with chair. Pt required seated rest due to fatigue and SOB.   Stairs             Wheelchair Mobility     Tilt Bed    Modified Rankin (Stroke Patients Only)       Balance Overall balance assessment: Needs assistance Sitting-balance support: Feet supported, Single extremity supported, Bilateral upper extremity supported Sitting balance-Leahy Scale: Fair Sitting balance - Comments: Pt sat EOB and scooted fwd with unilat/bilat UE support.   Standing balance support: Bilateral upper extremity supported, During functional activity, Reliant on assistive device for balance Standing balance-Leahy Scale:  Poor                              Communication Communication Communication: No apparent difficulties  Cognition Arousal: Alert Behavior During Therapy: Anxious   PT - Cognitive impairments:  Safety/Judgement                       PT - Cognition Comments: Pt becomes anxious due to fear of falling. Following commands: Intact Following commands impaired: Only follows one step commands consistently, Follows multi-step commands with increased time, Follows one step commands with increased time    Cueing Cueing Techniques: Verbal cues, Tactile cues  Exercises      General Comments        Pertinent Vitals/Pain Pain Assessment Pain Assessment: Faces Faces Pain Scale: Hurts a little bit Pain Location: R Hip Pain Descriptors / Indicators: Discomfort, Guarding, Aching Pain Intervention(s): Limited activity within patient's tolerance, Monitored during session    Home Living                          Prior Function            PT Goals (current goals can now be found in the care plan section) Acute Rehab PT Goals Patient Stated Goal: Return Home Time For Goal Achievement: 04/28/24 Potential to Achieve Goals: Fair Progress towards PT goals: Progressing toward goals    Frequency    7X/week      PT Plan      Co-evaluation              AM-PAC PT 6 Clicks Mobility   Outcome Measure  Help needed turning from your back to your side while in a flat bed without using bedrails?: A Little Help needed moving from lying on your back to sitting on the side of a flat bed without using bedrails?: A Little Help needed moving to and from a bed to a chair (including a wheelchair)?: A Little Help needed standing up from a chair using your arms (e.g., wheelchair or bedside chair)?: A Little Help needed to walk in hospital room?: A Little Help needed climbing 3-5 steps with a railing? : Total 6 Click Score: 16    End of Session Equipment Utilized During Treatment: Gait belt Activity Tolerance: Patient limited by fatigue Patient left: with call bell/phone within reach;in bed;with family/visitor present Nurse Communication: Mobility status PT Visit  Diagnosis: Pain;Difficulty in walking, not elsewhere classified (R26.2);Other abnormalities of gait and mobility (R26.89);Unsteadiness on feet (R26.81) Pain - Right/Left: Right Pain - part of body: Hip     Time: 8580-8552 PT Time Calculation (min) (ACUTE ONLY): 28 min  Charges:    $Gait Training: 23-37 mins $Therapeutic Activity: 8-22 mins PT General Charges $$ ACUTE PT VISIT: 1 Visit                      Macario RAMAN, PT Acute Rehabilitation Services  Office [phone]    Macario SHAUNNA Soja 04/19/2024, 3:19 PM

## 2024-04-19 NOTE — Care Management Important Message (Signed)
 Important Message  Patient Details  Name: [name] MRN: [id number] Date of Birth: 05-15-1946   Important Message Given:  Yes - Medicare IM     Jon Cruel 04/19/2024, 12:07 PM

## 2024-04-19 NOTE — Progress Notes (Signed)
 PHARMACY - ANTICOAGULATION CONSULT NOTE  Pharmacy Consult for Coumadin  Indication: Factor V Leiden, Lupus anticoagulant positive, long term Coumadin  use (2010 following stroke)  Allergies  Allergen Reactions   Baclofen  Other (See Comments)    felt muscles were weaker.   Codeine Other (See Comments)    hallucination   Duloxetine Hcl Other (See Comments)    Cymbalta*  could not tolerate.terrible shakes, breathing problems.heart race   Influenza Virus Vaccine Other (See Comments)    feverish red raised at site. Takes this every year    Nitrofurantoin  Other (See Comments)    headaches whole time on med, didnt feel well   Zoloft [Sertraline] Other (See Comments)     pt didnt' like how she felt.    Patient Measurements: Height: 5' 4 (162.6 cm) Weight: 86.2 kg (190 lb) IBW/kg (Calculated) : 54.7 HEPARIN DW (KG): 73.7  Vital Signs: Temp: 98.1 F (36.7 C) (07/07 0743) Temp Source: Oral (07/07 0543) BP: 130/57 (07/07 0743) Pulse Rate: 71 (07/07 0743)  Labs: Recent Labs    04/17/24 0457 04/18/24 0452 04/19/24 0500  HGB 10.7* 10.8* 11.0*  HCT 33.2* 32.8* 35.2*  PLT 251 292 345  LABPROT 15.0 16.6* 16.4*  INR 1.1 1.3* 1.3*    Estimated Creatinine Clearance: 61.6 mL/min (by C-G formula based on SCr of 0.64 mg/dL).   Assessment: 26 YOF presents for a R-THR. Pharmacy consulted to restart home warfarin dosing. She sees Technical sales engineer for [employer]. Per note from 03/2024 Start enoxaparin  80mg  every 12 hours on 04/06/24. On 04/14/24 restart warfarin 2.5mg  QD except 5mg  MWF and enoxaparin  80mg  every 12 hours. Overlap for at least 5 days. Recheck INR 04/19/24. If INR <2, continue enoxaparin  until INR >2.  INR 1.3 - rising very slowly on home dose (noted average daily home dose ~3.5 mg). Will likely need increased doses for multiple days since coumadin  held. No drug interactions noted. Hgb stable 10-11s post-op and no bleeding noted.  PTA regimen: 2.5 mg daily on  Sunday, Tuesday, Thursday, and Saturday / 5 mg on Monday, Wednesday, and Friday  Goal of Therapy:  INR 2-3 Monitor platelets by anticoagulation protocol: Yes   Plan:  Coumadin  7.5 mg po x1 today Lovenox  80 mg SQ q12h until INR >2 F/u s/sx bleeding and CBC daily  Daily INR   Thank you for involving pharmacy in this patient's care.  Vito Ralph, PharmD, BCPS Please see amion for complete clinical pharmacist phone list 04/19/2024 10:32 AM   Be curious, not judgmental... -Davina Sprinkles

## 2024-04-20 LAB — CBC
HCT: 34.9 % — ABNORMAL LOW (ref 36.0–46.0)
Hemoglobin: 11.2 g/dL — ABNORMAL LOW (ref 12.0–15.0)
MCH: 31.2 pg (ref 26.0–34.0)
MCHC: 32.1 g/dL (ref 30.0–36.0)
MCV: 97.2 fL (ref 80.0–100.0)
Platelets: 390 K/uL (ref 150–400)
RBC: 3.59 MIL/uL — ABNORMAL LOW (ref 3.87–5.11)
RDW: 13.4 % (ref 11.5–15.5)
WBC: 9.4 K/uL (ref 4.0–10.5)
nRBC: 0.2 % (ref 0.0–0.2)

## 2024-04-20 LAB — PROTIME-INR
INR: 1.3 — ABNORMAL HIGH (ref 0.8–1.2)
Prothrombin Time: 16.6 s — ABNORMAL HIGH (ref 11.4–15.2)

## 2024-04-20 LAB — CREATININE, SERUM
Creatinine, Ser: 0.66 mg/dL (ref 0.44–1.00)
GFR, Estimated: >60 mL/min (ref >60)

## 2024-04-20 MED ORDER — HYDROCODONE-ACETAMINOPHEN 5-325 MG PO TABS: 1.0000 | ORAL_TABLET | ORAL | 0 refills | Status: AC | PRN

## 2024-04-20 MED ORDER — SENNA-DOCUSATE SODIUM 8.6-50 MG PO TABS: 2.0000 | ORAL_TABLET | Freq: Every day | ORAL | 1 refills | Status: AC

## 2024-04-20 MED ORDER — ONDANSETRON HCL 4 MG PO TABS: 4.0000 mg | ORAL_TABLET | Freq: Three times a day (TID) | ORAL | 0 refills | Status: AC | PRN

## 2024-04-20 MED ORDER — WARFARIN SODIUM 7.5 MG PO TABS
7.5000 mg | ORAL_TABLET | Freq: Once | ORAL | Status: DC
  Filled 2024-04-20: qty 1

## 2024-04-20 MED ORDER — CEPHALEXIN 250 MG PO CAPS: 250.0000 mg | ORAL_CAPSULE | Freq: Four times a day (QID) | ORAL | 0 refills | Status: AC

## 2024-04-20 NOTE — Progress Notes (Signed)
     Subjective: 7 Days Post-Op s/p Procedure(s): RIGHT TOTAL HIP REPLACEMENT, POSTERIOR APPROACH   Patient is alert, oriented. Feels significantly better after antibiotic dose yesterday. Feels like she walked better with PT yesterday than she has prior to surgery. Pain so far well controlled. Ready to discharge home today.   Objective:  PE: VITALS:   Vitals:   04/19/24 1534 04/19/24 2053 04/20/24 0521 04/20/24 0750  BP: (!) 138/56 134/62 121/66 115/62  Pulse: 75 78 65 70  Resp: 16 18 15 18   Temp: (!) 97.5 F (36.4 C) 97.8 F (36.6 C) 98 F (36.7 C) 98.2 F (36.8 C)  TempSrc:  Oral Oral Oral  SpO2: 96% 97% 95% 96%  Weight:      Height:        Laying in bed in no acute distress Normal respiratory effort Sensation to foot at baseline Incision CDI  Performs 40 deg active FF at the hip  Flicker of movement at right great toe, no ability to dorsfilex though this is normal for patient. Hx of foot drop     LABS  Results for orders placed or performed during the hospital encounter of 04/13/24 (from the past 24 hours)  Creatinine, serum     Status: None   Collection Time: 04/20/24  6:20 AM  Result Value Ref Range   Creatinine, Ser 0.66 0.44 - 1.00 mg/dL   GFR, Estimated >39 >39 mL/min  Protime-INR     Status: Abnormal   Collection Time: 04/20/24  6:20 AM  Result Value Ref Range   Prothrombin Time 16.6 (H) 11.4 - 15.2 seconds   INR 1.3 (H) 0.8 - 1.2  CBC     Status: Abnormal   Collection Time: 04/20/24  6:20 AM  Result Value Ref Range   WBC 9.4 4.0 - 10.5 K/uL   RBC 3.59 (L) 3.87 - 5.11 MIL/uL   Hemoglobin 11.2 (L) 12.0 - 15.0 g/dL   HCT 65.0 (L) 63.9 - 53.9 %   MCV 97.2 80.0 - 100.0 fL   MCH 31.2 26.0 - 34.0 pg   MCHC 32.1 30.0 - 36.0 g/dL   RDW 86.5 88.4 - 84.4 %   Platelets 390 150 - 400 K/uL   nRBC 0.2 0.0 - 0.2 %    No results found.  Assessment/Plan: Principal Problem:   S/P total right hip arthroplasty  7 Days Post-Op s/p Procedure(s): RIGHT TOTAL  HIP REPLACEMENT, POSTERIOR APPROACH  Urinalysis yesterday + bacteria. One dose rocephin  given yesterday. Will discharge home on keflex  which has worked well for her in the past based on chart review.  Weightbearing: WBAT RLE, up with PT Insicional and dressing care: Reinforce dressings as needed VTE prophylaxis: Bridging back to coumadin  with lovenox  per pharmacy Pain control: continue current regimen Follow - up plan: 2 weeks with Dr. Josefina Dispo: PT note yesterday shows safe for discharge. Ok to discharge home today. OK to work with PT if available but does not need PT prior to discharge home.    Contact information:   Army Daring, DEVONNA Tzzxijbd 8-5  After hours and holidays please check [email or web address] for group call information for [facility]  Army MARLA Daring 04/20/2024, 10:02 AM

## 2024-04-20 NOTE — Progress Notes (Signed)
 PHARMACY - ANTICOAGULATION CONSULT NOTE  Pharmacy Consult for Coumadin  Indication: Factor V Leiden, Lupus anticoagulant positive, long term Coumadin  use (2010 following stroke)  Allergies  Allergen Reactions   Baclofen  Other (See Comments)    felt muscles were weaker.   Codeine Other (See Comments)    hallucination   Duloxetine Hcl Other (See Comments)    Cymbalta*  could not tolerate.terrible shakes, breathing problems.heart race   Influenza Virus Vaccine Other (See Comments)    feverish red raised at site. Takes this every year    Nitrofurantoin  Other (See Comments)    headaches whole time on med, didnt feel well   Zoloft [Sertraline] Other (See Comments)     pt didnt' like how she felt.    Patient Measurements: Height: 5' 4 (162.6 cm) Weight: 86.2 kg (190 lb) IBW/kg (Calculated) : 54.7 HEPARIN DW (KG): 73.7  Vital Signs: Temp: 98.2 F (36.8 C) (07/08 0750) Temp Source: Oral (07/08 0750) BP: 115/62 (07/08 0750) Pulse Rate: 70 (07/08 0750)  Labs: Recent Labs    04/18/24 0452 04/19/24 0500 04/20/24 0620  HGB 10.8* 11.0* 11.2*  HCT 32.8* 35.2* 34.9*  PLT 292 345 390  LABPROT 16.6* 16.4* 16.6*  INR 1.3* 1.3* 1.3*  CREATININE  --   --  0.66    Estimated Creatinine Clearance: 61.6 mL/min (by C-G formula based on SCr of 0.66 mg/dL).   Assessment: 43 YOF presents for a R-THR. Pharmacy consulted to restart home warfarin dosing. She sees Technical sales engineer for [employer] Per note from 03/2024 Start enoxaparin  80mg  every 12 hours on 04/06/24. On 04/14/24 restart warfarin 2.5mg  QD except 5mg  MWF and enoxaparin  80mg  every 12 hours. Overlap for at least 5 days. Recheck INR 04/19/24. If INR <2, continue enoxaparin  until INR >2.  INR remains at 1.3 - rising very slowly on home dose (noted average daily home dose ~3.5 mg). Will likely need increased doses for multiple days since coumadin  held. No drug interactions noted. Hgb stable 10-11s post-op and no bleeding  noted.  PTA regimen: 2.5 mg daily on Sunday, Tuesday, Thursday, and Saturday / 5 mg on Monday, Wednesday, and Friday  Goal of Therapy:  INR 2-3 Monitor platelets by anticoagulation protocol: Yes   Plan:  Repeat Coumadin  7.5 mg po x1 today - give prior to discharge Lovenox  80 mg SQ q12h until INR >2 F/u s/sx bleeding and CBC daily  Daily INR   Merinda Victorino D. Lendell, PharmD, BCPS, BCCCP 04/20/2024, 11:19 AM

## 2024-04-20 NOTE — Progress Notes (Signed)
Explained discharge instructions to patient. Reviewed follow up appointment and next medication administration times. Also reviewed education. Patient verbalized having an understanding for instructions given. All belongings are in the patient's possession. IV removed. No other needs verbalized. Transported downstairs for discharge. 

## 2024-04-20 NOTE — Plan of Care (Signed)
  Problem: Clinical Measurements: Goal: Ability to maintain clinical measurements within normal limits will improve Outcome: Progressing Goal: Will remain free from infection Outcome: Progressing   Problem: Coping: Goal: Level of anxiety will decrease Outcome: Progressing   Problem: Elimination: Goal: Will not experience complications related to bowel motility Outcome: Progressing   Problem: Education: Goal: Knowledge of the prescribed therapeutic regimen will improve Outcome: Progressing

## 2024-04-20 NOTE — Progress Notes (Signed)
 PT Cancellation Note  Patient Details Name: [name] MRN: [id number] DOB: August 24, 1946   Cancelled Treatment:    Reason Eval/Treat Not Completed: (P) Patient declined, no reason specified, pt declining session prior to d/c, pt and pt care person without questions or concerns. Will check back as schedule allows to continue with PT POC.  [name]. PTA Acute Rehabilitation Services Office: [phone]    [name] Boor 04/20/2024, 10:19 AM

## 2024-04-20 NOTE — Discharge Summary (Signed)
 Physician Discharge Summary  Patient ID: [name] MRN: [id number] DOB/AGE: [birth year] 78 y.o.  Admit date: 04/13/2024 Discharge date: 04/20/2024  Admission Diagnoses:  S/P total right hip arthroplasty  Discharge Diagnoses:  Principal Problem:   S/P total right hip arthroplasty   Past Medical History:  Diagnosis Date   Allergic rhinitis    Blood dyscrasia    Cancer (HCC)    Skin Cancer on Left Leg   Clotting disorder (HCC)    Factor 5   Factor 5 Leiden mutation, heterozygous (HCC)    Family history of adverse reaction to anesthesia    Daughter was paralyzed for several days after 1 surgery.   Heart murmur    mild mitral regurg-echo 2010   Hyperlipidemia    Hypertension    Lupus anticoagulant positive    Obesity    Pneumonia    In High School   Pre-diabetes    Primary localized osteoarthritis of left hip 03/30/2019   Stroke [facility]) 2010   Some right sided weakness.  Gait issues.   Wears dentures    partial upper    Surgeries: Procedure(s): RIGHT TOTAL HIP REPLACEMENT, POSTERIOR APPROACH on 04/13/2024   Consultants (if any):   Discharged Condition: Improved  Hospital Course: [name] is an 78 y.o. female who was admitted 04/13/2024 with a diagnosis of right hip osteoarthritis and went to the operating room on 04/13/2024 and underwent the above named procedures.    She was given perioperative antibiotics. Stay was complicated by a UTI, she was given antibiotics for this as well.  Anti-infectives (From admission, onward)    Start     Dose/Rate Route Frequency Ordered Stop   04/20/24 0000  cephALEXin  (KEFLEX ) capsule 250 mg        250 mg Oral Every 6 hours 04/19/24 1204 04/26/24 2359   04/20/24 0000  cephALEXin  (KEFLEX ) 250 MG capsule        250 mg Oral 4 times daily 04/20/24 0715 04/27/24 2359   04/19/24 1000  cefTRIAXone  (ROCEPHIN ) 1 g in sodium chloride  0.9 % 100 mL IVPB        1 g 200 mL/hr over 30 Minutes Intravenous  Once 04/19/24 0854 04/19/24 1023    04/13/24 2130  ceFAZolin  (ANCEF ) IVPB 2g/100 mL premix        2 g 200 mL/hr over 30 Minutes Intravenous Every 6 hours 04/13/24 1813 04/14/24 0739   04/13/24 1045  ceFAZolin  (ANCEF ) IVPB 2g/100 mL premix        2 g 200 mL/hr over 30 Minutes Intravenous On call to O.R. 04/13/24 1031 04/13/24 1333   04/13/24 1040  ceFAZolin  (ANCEF ) 2-4 GM/100ML-% IVPB       Note to Pharmacy: Claudene Craven D: cabinet override      04/13/24 1040 04/13/24 1349     .  She was given sequential compression devices, early ambulation, and lovenox /coumadin  for DVT prophylaxis.  She benefited maximally from the hospital stay. Stay was complicated by UTI as noted above.   Inpatient Morphine  Milligram Equivalents Per Day 7/1 - 7/8   Values displayed are in units of MME/Day    Order Start / End Date 7/1 7/2 7/3 7/4 7/5 7/6 Yesterday Today    oxyCODONE  (Oxy IR/ROXICODONE ) immediate release tablet 5 mg 7/1 - 7/1 7.5 of Unknown -- -- -- -- -- -- --    oxyCODONE  (ROXICODONE ) 5 MG/5ML solution 5 mg 7/1 - 7/1 0 of Unknown -- -- -- -- -- -- --  Group total: 7.5 of Unknown           HYDROmorphone  (DILAUDID ) injection 0.25-0.5 mg 7/1 - 7/1 20 of 40-80 -- -- -- -- -- -- --    fentaNYL  citrate (PF) (SUBLIMAZE ) injection 7/1 - 7/1 *22.5 of 22.5 -- -- -- -- -- -- --    HYDROmorphone  (DILAUDID ) injection 7/1 - 7/1 *10 of 10 -- -- -- -- -- -- --    HYDROcodone -acetaminophen  (NORCO/VICODIN) 5-325 MG per tablet 1-2 tablet 7/1 - 7/2 10 of 10-20 10 of 10-20 -- -- -- -- -- --    HYDROcodone -acetaminophen  (NORCO) 7.5-325 MG per tablet 1-2 tablet 7/1 - 7/2 0 of 15-30 15 of 15-30 -- -- -- -- -- --    morphine  (PF) 2 MG/ML injection 0.5-1 mg 7/1 - No end date 0 of 4.5-9 0 of 18-36 0 of 18-36 0 of 18-36 0 of 18-36 0 of 18-36 0 of 18-36 0 of 18-36    HYDROcodone -acetaminophen  (NORCO/VICODIN) 5-325 MG per tablet 1-2 tablet 7/2 - No end date -- 20 of 20-40 30 of 30-60 20 of 30-60 10 of 30-60 10 of 30-60 20 of 30-60 0 of 30-60    traMADol   (ULTRAM ) tablet 50 mg 7/2 - No end date -- 5 of 20 0 of 30 0 of 30 0 of 30 0 of 30 0 of 30 0 of 30    Daily Totals  * 70 of Unknown (at least 102-171.5) 50 of 83-146 30 of 78-126 20 of 78-126 10 of 78-126 10 of 78-126 20 of 78-126 0 of 78-126  *One-Step medication  Calculation Errors     Order Type Date Details   oxyCODONE  (Oxy IR/ROXICODONE ) immediate release tablet 5 mg Ordered Dose -- Insufficient frequency information   oxyCODONE  (ROXICODONE ) 5 MG/5ML solution 5 mg Ordered Dose -- Insufficient frequency information            Recent vital signs:  Vitals:   04/20/24 0521 04/20/24 0750  BP: 121/66 115/62  Pulse: 65 70  Resp: 15 18  Temp: 98 F (36.7 C) 98.2 F (36.8 C)  SpO2: 95% 96%    Recent laboratory studies:  Lab Results  Component Value Date   HGB 11.2 (L) 04/20/2024   HGB 11.0 (L) 04/19/2024   HGB 10.8 (L) 04/18/2024   Lab Results  Component Value Date   WBC 9.4 04/20/2024   PLT 390 04/20/2024   Lab Results  Component Value Date   INR 1.3 (H) 04/20/2024   Lab Results  Component Value Date   NA 138 04/16/2024   K 4.0 04/16/2024   CL 103 04/16/2024   CO2 25 04/16/2024   BUN 8 04/16/2024   CREATININE 0.66 04/20/2024   GLUCOSE 111 (H) 04/16/2024    Discharge Medications:   Allergies as of 04/20/2024       Reactions   Baclofen  Other (See Comments)   felt muscles were weaker.   Codeine Other (See Comments)   hallucination   Duloxetine Hcl Other (See Comments)   Cymbalta*  could not tolerate.terrible shakes, breathing problems.heart race   Influenza Virus Vaccine Other (See Comments)   feverish red raised at site. Takes this every year    Nitrofurantoin  Other (See Comments)   headaches whole time on med, didnt feel well   Zoloft [sertraline] Other (See Comments)    pt didnt' like how she felt.        Medication List     STOP taking these medications  acetaminophen  650 MG CR tablet Commonly known as: TYLENOL        TAKE these  medications    amLODipine  10 MG tablet Commonly known as: NORVASC  Take 5 mg by mouth 2 (two) times daily.   B COMPLEX PO Take 1 tablet by mouth daily.   BIOTIN PO Take 1 tablet by mouth daily.   cephALEXin  250 MG capsule Commonly known as: KEFLEX  Take 1 capsule (250 mg total) by mouth 4 (four) times daily for 7 days.   cholecalciferol  1000 units tablet Commonly known as: VITAMIN D  Take 1,000 Units by mouth daily.   cyanocobalamin  1000 MCG tablet Commonly known as: VITAMIN B12 Take 1,000 mcg by mouth daily.   denosumab  60 MG/ML Sosy injection Commonly known as: PROLIA  Inject 60 mg into the skin every 6 (six) months.   enoxaparin  80 MG/0.8ML injection Commonly known as: LOVENOX  Inject 80 mg into the skin in the morning and at bedtime. What changed: Another medication with the same name was removed. Continue taking this medication, and follow the directions you see here.   escitalopram  20 MG tablet Commonly known as: LEXAPRO  Take 20 mg by mouth daily.   ezetimibe  10 MG tablet Commonly known as: ZETIA  Take 5 mg by mouth 3 (three) times a week.   fexofenadine 60 MG tablet Commonly known as: ALLEGRA Take 60 mg by mouth daily.   fluticasone  50 MCG/ACT nasal spray Commonly known as: FLONASE  Place 2 sprays into both nostrils daily as needed for allergies.   HYDROcodone -acetaminophen  5-325 MG tablet Commonly known as: NORCO/VICODIN Take 1-2 tablets by mouth every 4 (four) hours as needed for moderate pain (pain score 4-6).   lisinopril  20 MG tablet Commonly known as: ZESTRIL  Take 10 mg by mouth 2 (two) times daily.   Magnesium  250 MG Tabs Take 250 mg by mouth daily.   ondansetron  4 MG tablet Commonly known as: Zofran  Take 1 tablet (4 mg total) by mouth every 8 (eight) hours as needed for nausea or vomiting.   sennosides-docusate sodium  8.6-50 MG tablet Commonly known as: SENOKOT-S Take 2 tablets by mouth daily. For constipation from pain medication. Do not take  if having normal bowel movements   simvastatin  20 MG tablet Commonly known as: ZOCOR  Take 20 mg by mouth at bedtime.   warfarin 5 MG tablet Commonly known as: COUMADIN  Take 2.5-5 mg by mouth See admin instructions. Take per instruction of Coumadin  Clinic               Durable Medical Equipment  (From admission, onward)           Start     Ordered   04/16/24 0953  For home use only DME Bedside commode  Once       Question:  Patient needs a bedside commode to treat with the following condition  Answer:  Weakness   04/16/24 0953            Diagnostic Studies: DG HIP UNILAT W OR W/O PELVIS 2-3 VIEWS RIGHT Result Date: 04/13/2024 CLINICAL DATA:  Postop right hip replacement. EXAM: DG HIP (WITH OR WITHOUT PELVIS) 2-3V RIGHT COMPARISON:  None Available. FINDINGS: Right hip arthroplasty in expected alignment. No periprosthetic lucency or fracture. Recent postsurgical change includes air and edema in the soft tissues. Previous left hip arthroplasty. IMPRESSION: Right hip arthroplasty without immediate postoperative complication. Electronically Signed   By: Andrea Gasman M.D.   On: 04/13/2024 16:43    Disposition: Discharge disposition: 06-Home-Health Care Svc  Follow-up Information     Josefina Chew, MD. Go on 04/28/2024.   Specialty: Orthopedic Surgery Why: Your appointment is scheduled for 10:45. Contact information: [address] [place] [address] [phone]         [facility] Follow up.   Why: HHPT will provide 6 home visits prior to starting outpatient physical therapy        [facility]. Go on 04/29/2024.   Why: your appointment has been scheduled. they will call you with a time Contact information: [phone]        [facility] Follow up.   Why: bedside commode Contact information: [phone]                 Signed: Army MARLA Daring 04/20/2024, 10:06 AM

## 2024-04-22 LAB — TYPE AND SCREEN
ABO/RH(D): O NEG
Antibody Screen: POSITIVE
Unit division: 0
Unit division: 0

## 2024-04-22 LAB — BPAM RBC
Blood Product Expiration Date: 202507272359
Blood Product Expiration Date: 202507272359
Unit Type and Rh: 9500
Unit Type and Rh: 9500

## 2024-04-23 DIAGNOSIS — M1610 Unilateral primary osteoarthritis, unspecified hip: Secondary | ICD-10-CM | POA: Diagnosis not present

## 2024-04-23 DIAGNOSIS — I69351 Hemiplegia and hemiparesis following cerebral infarction affecting right dominant side: Secondary | ICD-10-CM | POA: Diagnosis not present

## 2024-04-23 DIAGNOSIS — R011 Cardiac murmur, unspecified: Secondary | ICD-10-CM | POA: Diagnosis not present

## 2024-04-23 DIAGNOSIS — C4490 Unspecified malignant neoplasm of skin, unspecified: Secondary | ICD-10-CM | POA: Diagnosis not present

## 2024-04-23 DIAGNOSIS — D689 Coagulation defect, unspecified: Secondary | ICD-10-CM | POA: Diagnosis not present

## 2024-04-23 DIAGNOSIS — Z471 Aftercare following joint replacement surgery: Secondary | ICD-10-CM | POA: Diagnosis not present

## 2024-04-23 DIAGNOSIS — R7301 Impaired fasting glucose: Secondary | ICD-10-CM | POA: Diagnosis not present

## 2024-04-23 DIAGNOSIS — N390 Urinary tract infection, site not specified: Secondary | ICD-10-CM | POA: Diagnosis not present

## 2024-04-23 DIAGNOSIS — D759 Disease of blood and blood-forming organs, unspecified: Secondary | ICD-10-CM | POA: Diagnosis not present

## 2024-04-23 DIAGNOSIS — J309 Allergic rhinitis, unspecified: Secondary | ICD-10-CM | POA: Diagnosis not present

## 2024-04-23 DIAGNOSIS — Z96641 Presence of right artificial hip joint: Secondary | ICD-10-CM | POA: Diagnosis not present

## 2024-04-23 DIAGNOSIS — D682 Hereditary deficiency of other clotting factors: Secondary | ICD-10-CM | POA: Diagnosis not present

## 2024-04-23 DIAGNOSIS — E785 Hyperlipidemia, unspecified: Secondary | ICD-10-CM | POA: Diagnosis not present

## 2024-04-23 DIAGNOSIS — Z7901 Long term (current) use of anticoagulants: Secondary | ICD-10-CM | POA: Diagnosis not present

## 2024-04-24 DIAGNOSIS — Z7982 Long term (current) use of aspirin: Secondary | ICD-10-CM | POA: Diagnosis not present

## 2024-04-24 DIAGNOSIS — D6859 Other primary thrombophilia: Secondary | ICD-10-CM | POA: Diagnosis not present

## 2024-04-24 DIAGNOSIS — R791 Abnormal coagulation profile: Secondary | ICD-10-CM | POA: Diagnosis not present

## 2024-04-24 DIAGNOSIS — I10 Essential (primary) hypertension: Secondary | ICD-10-CM | POA: Diagnosis not present

## 2024-04-24 DIAGNOSIS — N3090 Cystitis, unspecified without hematuria: Secondary | ICD-10-CM | POA: Diagnosis not present

## 2024-04-24 DIAGNOSIS — N39490 Overflow incontinence: Secondary | ICD-10-CM | POA: Diagnosis not present

## 2024-04-24 DIAGNOSIS — M329 Systemic lupus erythematosus, unspecified: Secondary | ICD-10-CM | POA: Diagnosis not present

## 2024-04-24 DIAGNOSIS — Z7901 Long term (current) use of anticoagulants: Secondary | ICD-10-CM | POA: Diagnosis not present

## 2024-04-25 DIAGNOSIS — D6859 Other primary thrombophilia: Secondary | ICD-10-CM | POA: Diagnosis not present

## 2024-04-25 DIAGNOSIS — M329 Systemic lupus erythematosus, unspecified: Secondary | ICD-10-CM | POA: Diagnosis not present

## 2024-04-25 DIAGNOSIS — N390 Urinary tract infection, site not specified: Secondary | ICD-10-CM | POA: Diagnosis not present

## 2024-04-25 DIAGNOSIS — N39490 Overflow incontinence: Secondary | ICD-10-CM | POA: Diagnosis not present

## 2024-04-27 DIAGNOSIS — J309 Allergic rhinitis, unspecified: Secondary | ICD-10-CM | POA: Diagnosis not present

## 2024-04-27 DIAGNOSIS — N390 Urinary tract infection, site not specified: Secondary | ICD-10-CM | POA: Diagnosis not present

## 2024-04-27 DIAGNOSIS — C4490 Unspecified malignant neoplasm of skin, unspecified: Secondary | ICD-10-CM | POA: Diagnosis not present

## 2024-04-27 DIAGNOSIS — D682 Hereditary deficiency of other clotting factors: Secondary | ICD-10-CM | POA: Diagnosis not present

## 2024-04-27 DIAGNOSIS — R011 Cardiac murmur, unspecified: Secondary | ICD-10-CM | POA: Diagnosis not present

## 2024-04-27 DIAGNOSIS — Z471 Aftercare following joint replacement surgery: Secondary | ICD-10-CM | POA: Diagnosis not present

## 2024-04-27 DIAGNOSIS — E785 Hyperlipidemia, unspecified: Secondary | ICD-10-CM | POA: Diagnosis not present

## 2024-04-27 DIAGNOSIS — I69351 Hemiplegia and hemiparesis following cerebral infarction affecting right dominant side: Secondary | ICD-10-CM | POA: Diagnosis not present

## 2024-04-27 DIAGNOSIS — D759 Disease of blood and blood-forming organs, unspecified: Secondary | ICD-10-CM | POA: Diagnosis not present

## 2024-04-28 DIAGNOSIS — M1611 Unilateral primary osteoarthritis, right hip: Secondary | ICD-10-CM | POA: Diagnosis not present

## 2024-04-29 ENCOUNTER — Ambulatory Visit

## 2024-04-29 DIAGNOSIS — N390 Urinary tract infection, site not specified: Secondary | ICD-10-CM | POA: Diagnosis not present

## 2024-04-29 DIAGNOSIS — R011 Cardiac murmur, unspecified: Secondary | ICD-10-CM | POA: Diagnosis not present

## 2024-04-29 DIAGNOSIS — J309 Allergic rhinitis, unspecified: Secondary | ICD-10-CM | POA: Diagnosis not present

## 2024-04-29 DIAGNOSIS — D682 Hereditary deficiency of other clotting factors: Secondary | ICD-10-CM | POA: Diagnosis not present

## 2024-04-29 DIAGNOSIS — C4490 Unspecified malignant neoplasm of skin, unspecified: Secondary | ICD-10-CM | POA: Diagnosis not present

## 2024-04-29 DIAGNOSIS — E785 Hyperlipidemia, unspecified: Secondary | ICD-10-CM | POA: Diagnosis not present

## 2024-04-29 DIAGNOSIS — D759 Disease of blood and blood-forming organs, unspecified: Secondary | ICD-10-CM | POA: Diagnosis not present

## 2024-04-29 DIAGNOSIS — I69351 Hemiplegia and hemiparesis following cerebral infarction affecting right dominant side: Secondary | ICD-10-CM | POA: Diagnosis not present

## 2024-05-03 ENCOUNTER — Encounter

## 2024-05-03 ENCOUNTER — Ambulatory Visit

## 2024-05-03 DIAGNOSIS — M1611 Unilateral primary osteoarthritis, right hip: Secondary | ICD-10-CM | POA: Diagnosis not present

## 2024-05-04 DIAGNOSIS — I69351 Hemiplegia and hemiparesis following cerebral infarction affecting right dominant side: Secondary | ICD-10-CM | POA: Diagnosis not present

## 2024-05-04 DIAGNOSIS — E785 Hyperlipidemia, unspecified: Secondary | ICD-10-CM | POA: Diagnosis not present

## 2024-05-04 DIAGNOSIS — C4490 Unspecified malignant neoplasm of skin, unspecified: Secondary | ICD-10-CM | POA: Diagnosis not present

## 2024-05-04 DIAGNOSIS — N390 Urinary tract infection, site not specified: Secondary | ICD-10-CM | POA: Diagnosis not present

## 2024-05-04 DIAGNOSIS — R011 Cardiac murmur, unspecified: Secondary | ICD-10-CM | POA: Diagnosis not present

## 2024-05-04 DIAGNOSIS — J309 Allergic rhinitis, unspecified: Secondary | ICD-10-CM | POA: Diagnosis not present

## 2024-05-04 DIAGNOSIS — D759 Disease of blood and blood-forming organs, unspecified: Secondary | ICD-10-CM | POA: Diagnosis not present

## 2024-05-04 DIAGNOSIS — D682 Hereditary deficiency of other clotting factors: Secondary | ICD-10-CM | POA: Diagnosis not present

## 2024-05-05 ENCOUNTER — Ambulatory Visit

## 2024-05-06 DIAGNOSIS — N390 Urinary tract infection, site not specified: Secondary | ICD-10-CM | POA: Diagnosis not present

## 2024-05-06 DIAGNOSIS — D759 Disease of blood and blood-forming organs, unspecified: Secondary | ICD-10-CM | POA: Diagnosis not present

## 2024-05-06 DIAGNOSIS — R011 Cardiac murmur, unspecified: Secondary | ICD-10-CM | POA: Diagnosis not present

## 2024-05-06 DIAGNOSIS — E785 Hyperlipidemia, unspecified: Secondary | ICD-10-CM | POA: Diagnosis not present

## 2024-05-06 DIAGNOSIS — C4490 Unspecified malignant neoplasm of skin, unspecified: Secondary | ICD-10-CM | POA: Diagnosis not present

## 2024-05-06 DIAGNOSIS — D682 Hereditary deficiency of other clotting factors: Secondary | ICD-10-CM | POA: Diagnosis not present

## 2024-05-06 DIAGNOSIS — J309 Allergic rhinitis, unspecified: Secondary | ICD-10-CM | POA: Diagnosis not present

## 2024-05-06 DIAGNOSIS — Z471 Aftercare following joint replacement surgery: Secondary | ICD-10-CM | POA: Diagnosis not present

## 2024-05-06 DIAGNOSIS — I69351 Hemiplegia and hemiparesis following cerebral infarction affecting right dominant side: Secondary | ICD-10-CM | POA: Diagnosis not present

## 2024-05-10 ENCOUNTER — Ambulatory Visit

## 2024-05-10 DIAGNOSIS — D759 Disease of blood and blood-forming organs, unspecified: Secondary | ICD-10-CM | POA: Diagnosis not present

## 2024-05-10 DIAGNOSIS — E785 Hyperlipidemia, unspecified: Secondary | ICD-10-CM | POA: Diagnosis not present

## 2024-05-10 DIAGNOSIS — R011 Cardiac murmur, unspecified: Secondary | ICD-10-CM | POA: Diagnosis not present

## 2024-05-10 DIAGNOSIS — D682 Hereditary deficiency of other clotting factors: Secondary | ICD-10-CM | POA: Diagnosis not present

## 2024-05-10 DIAGNOSIS — I69351 Hemiplegia and hemiparesis following cerebral infarction affecting right dominant side: Secondary | ICD-10-CM | POA: Diagnosis not present

## 2024-05-10 DIAGNOSIS — N390 Urinary tract infection, site not specified: Secondary | ICD-10-CM | POA: Diagnosis not present

## 2024-05-10 DIAGNOSIS — J309 Allergic rhinitis, unspecified: Secondary | ICD-10-CM | POA: Diagnosis not present

## 2024-05-10 DIAGNOSIS — C4490 Unspecified malignant neoplasm of skin, unspecified: Secondary | ICD-10-CM | POA: Diagnosis not present

## 2024-05-12 ENCOUNTER — Encounter

## 2024-05-12 DIAGNOSIS — R011 Cardiac murmur, unspecified: Secondary | ICD-10-CM | POA: Diagnosis not present

## 2024-05-12 DIAGNOSIS — D682 Hereditary deficiency of other clotting factors: Secondary | ICD-10-CM | POA: Diagnosis not present

## 2024-05-12 DIAGNOSIS — N390 Urinary tract infection, site not specified: Secondary | ICD-10-CM | POA: Diagnosis not present

## 2024-05-12 DIAGNOSIS — J309 Allergic rhinitis, unspecified: Secondary | ICD-10-CM | POA: Diagnosis not present

## 2024-05-12 DIAGNOSIS — Z471 Aftercare following joint replacement surgery: Secondary | ICD-10-CM | POA: Diagnosis not present

## 2024-05-12 DIAGNOSIS — C4490 Unspecified malignant neoplasm of skin, unspecified: Secondary | ICD-10-CM | POA: Diagnosis not present

## 2024-05-12 DIAGNOSIS — D759 Disease of blood and blood-forming organs, unspecified: Secondary | ICD-10-CM | POA: Diagnosis not present

## 2024-05-12 DIAGNOSIS — E785 Hyperlipidemia, unspecified: Secondary | ICD-10-CM | POA: Diagnosis not present

## 2024-05-12 DIAGNOSIS — I69351 Hemiplegia and hemiparesis following cerebral infarction affecting right dominant side: Secondary | ICD-10-CM | POA: Diagnosis not present

## 2024-05-13 DIAGNOSIS — N390 Urinary tract infection, site not specified: Secondary | ICD-10-CM | POA: Diagnosis not present

## 2024-05-13 DIAGNOSIS — C4490 Unspecified malignant neoplasm of skin, unspecified: Secondary | ICD-10-CM | POA: Diagnosis not present

## 2024-05-13 DIAGNOSIS — D759 Disease of blood and blood-forming organs, unspecified: Secondary | ICD-10-CM | POA: Diagnosis not present

## 2024-05-13 DIAGNOSIS — R011 Cardiac murmur, unspecified: Secondary | ICD-10-CM | POA: Diagnosis not present

## 2024-05-13 DIAGNOSIS — D682 Hereditary deficiency of other clotting factors: Secondary | ICD-10-CM | POA: Diagnosis not present

## 2024-05-13 DIAGNOSIS — J309 Allergic rhinitis, unspecified: Secondary | ICD-10-CM | POA: Diagnosis not present

## 2024-05-13 DIAGNOSIS — I69351 Hemiplegia and hemiparesis following cerebral infarction affecting right dominant side: Secondary | ICD-10-CM | POA: Diagnosis not present

## 2024-05-13 DIAGNOSIS — E785 Hyperlipidemia, unspecified: Secondary | ICD-10-CM | POA: Diagnosis not present

## 2024-05-17 ENCOUNTER — Encounter

## 2024-05-17 DIAGNOSIS — J309 Allergic rhinitis, unspecified: Secondary | ICD-10-CM | POA: Diagnosis not present

## 2024-05-17 DIAGNOSIS — N390 Urinary tract infection, site not specified: Secondary | ICD-10-CM | POA: Diagnosis not present

## 2024-05-17 DIAGNOSIS — R011 Cardiac murmur, unspecified: Secondary | ICD-10-CM | POA: Diagnosis not present

## 2024-05-17 DIAGNOSIS — I69351 Hemiplegia and hemiparesis following cerebral infarction affecting right dominant side: Secondary | ICD-10-CM | POA: Diagnosis not present

## 2024-05-17 DIAGNOSIS — D682 Hereditary deficiency of other clotting factors: Secondary | ICD-10-CM | POA: Diagnosis not present

## 2024-05-17 DIAGNOSIS — E785 Hyperlipidemia, unspecified: Secondary | ICD-10-CM | POA: Diagnosis not present

## 2024-05-17 DIAGNOSIS — D759 Disease of blood and blood-forming organs, unspecified: Secondary | ICD-10-CM | POA: Diagnosis not present

## 2024-05-17 DIAGNOSIS — C4490 Unspecified malignant neoplasm of skin, unspecified: Secondary | ICD-10-CM | POA: Diagnosis not present

## 2024-05-19 ENCOUNTER — Encounter

## 2024-05-19 DIAGNOSIS — D759 Disease of blood and blood-forming organs, unspecified: Secondary | ICD-10-CM | POA: Diagnosis not present

## 2024-05-19 DIAGNOSIS — I69351 Hemiplegia and hemiparesis following cerebral infarction affecting right dominant side: Secondary | ICD-10-CM | POA: Diagnosis not present

## 2024-05-19 DIAGNOSIS — N390 Urinary tract infection, site not specified: Secondary | ICD-10-CM | POA: Diagnosis not present

## 2024-05-19 DIAGNOSIS — C4490 Unspecified malignant neoplasm of skin, unspecified: Secondary | ICD-10-CM | POA: Diagnosis not present

## 2024-05-19 DIAGNOSIS — R011 Cardiac murmur, unspecified: Secondary | ICD-10-CM | POA: Diagnosis not present

## 2024-05-19 DIAGNOSIS — J309 Allergic rhinitis, unspecified: Secondary | ICD-10-CM | POA: Diagnosis not present

## 2024-05-19 DIAGNOSIS — Z471 Aftercare following joint replacement surgery: Secondary | ICD-10-CM | POA: Diagnosis not present

## 2024-05-19 DIAGNOSIS — D682 Hereditary deficiency of other clotting factors: Secondary | ICD-10-CM | POA: Diagnosis not present

## 2024-05-19 DIAGNOSIS — E785 Hyperlipidemia, unspecified: Secondary | ICD-10-CM | POA: Diagnosis not present

## 2024-05-20 DIAGNOSIS — N390 Urinary tract infection, site not specified: Secondary | ICD-10-CM | POA: Diagnosis not present

## 2024-05-20 DIAGNOSIS — I69351 Hemiplegia and hemiparesis following cerebral infarction affecting right dominant side: Secondary | ICD-10-CM | POA: Diagnosis not present

## 2024-05-20 DIAGNOSIS — R011 Cardiac murmur, unspecified: Secondary | ICD-10-CM | POA: Diagnosis not present

## 2024-05-20 DIAGNOSIS — D759 Disease of blood and blood-forming organs, unspecified: Secondary | ICD-10-CM | POA: Diagnosis not present

## 2024-05-20 DIAGNOSIS — C4490 Unspecified malignant neoplasm of skin, unspecified: Secondary | ICD-10-CM | POA: Diagnosis not present

## 2024-05-20 DIAGNOSIS — Z471 Aftercare following joint replacement surgery: Secondary | ICD-10-CM | POA: Diagnosis not present

## 2024-05-20 DIAGNOSIS — J309 Allergic rhinitis, unspecified: Secondary | ICD-10-CM | POA: Diagnosis not present

## 2024-05-20 DIAGNOSIS — E785 Hyperlipidemia, unspecified: Secondary | ICD-10-CM | POA: Diagnosis not present

## 2024-05-20 DIAGNOSIS — D682 Hereditary deficiency of other clotting factors: Secondary | ICD-10-CM | POA: Diagnosis not present

## 2024-05-21 DIAGNOSIS — R011 Cardiac murmur, unspecified: Secondary | ICD-10-CM | POA: Diagnosis not present

## 2024-05-21 DIAGNOSIS — Z471 Aftercare following joint replacement surgery: Secondary | ICD-10-CM | POA: Diagnosis not present

## 2024-05-21 DIAGNOSIS — D759 Disease of blood and blood-forming organs, unspecified: Secondary | ICD-10-CM | POA: Diagnosis not present

## 2024-05-21 DIAGNOSIS — J309 Allergic rhinitis, unspecified: Secondary | ICD-10-CM | POA: Diagnosis not present

## 2024-05-21 DIAGNOSIS — D682 Hereditary deficiency of other clotting factors: Secondary | ICD-10-CM | POA: Diagnosis not present

## 2024-05-21 DIAGNOSIS — I69351 Hemiplegia and hemiparesis following cerebral infarction affecting right dominant side: Secondary | ICD-10-CM | POA: Diagnosis not present

## 2024-05-21 DIAGNOSIS — E785 Hyperlipidemia, unspecified: Secondary | ICD-10-CM | POA: Diagnosis not present

## 2024-05-21 DIAGNOSIS — N390 Urinary tract infection, site not specified: Secondary | ICD-10-CM | POA: Diagnosis not present

## 2024-05-21 DIAGNOSIS — C4490 Unspecified malignant neoplasm of skin, unspecified: Secondary | ICD-10-CM | POA: Diagnosis not present

## 2024-05-23 NOTE — Progress Notes (Deleted)
   Cardiology Clinic Note   Date: 05/23/2024 ID: ROBYNN MARCEL, DOB May 14, 1946, MRN [id number]  Primary Cardiologist:  Redell Cave, MD  Chief Complaint   ALVIN RUBANO is a 78 y.o. female who presents to the clinic today for ***  Patient Profile   LIDIE GLADE is followed by Dr. Cave for the history outlined below.      Past medical history significant for: Hypertension. Hyperlipidemia.*** Factor V Leiden. Lupus anticoagulant. T2DM. CVA.  In summary, patient was first evaluated by Dr. Cave on 12/18/2023 for preoperative risk assessment prior to hip replacement.  It was noted in the EMR reported mild MR from an echo back in 2010 but results were not available.  Patient has a history of factor V Leiden on Coumadin  managed by PCP.  Patient underwent echo on 12/31/2023 which showed EF 60 to 65%, no RWMA, Grade I DD, normal RV size/function, mild aortic valve calcification/sclerosis without stenosis.     History of Present Illness    Today, patient ***  Hypertension BP today*** - Continue lisinopril , amlodipine .  Hyperlipidemia ***  Factor V Leiden/lupus anticoagulant Patient denies spontaneous bleeding concerns. - Continue Coumadin  managed by PCP.  ROS: All other systems reviewed and are otherwise negative except as noted in History of Present Illness.  EKGs/Labs Reviewed        04/16/2024: BUN 8; Potassium 4.0; Sodium 138 04/20/2024: Creatinine, Ser 0.66   04/20/2024: Hemoglobin 11.2; WBC 9.4   No results found for requested labs within last 365 days.   No results found for requested labs within last 365 days.  ***  Risk Assessment/Calculations    {Does this patient have ATRIAL FIBRILLATION?:[phone]} No BP recorded.  {Refresh Note OR Click here to enter BP  :1}***        Physical Exam    VS:  There were no vitals taken for this visit. , BMI There is no height or weight on file to calculate BMI.  GEN: Well nourished, well developed, in no  acute distress. Neck: No JVD or carotid bruits. Cardiac: *** RRR. *** No murmur. No rubs or gallops.   Respiratory:  Respirations regular and unlabored. Clear to auscultation without rales, wheezing or rhonchi. GI: Soft, nontender, nondistended. Extremities: Radials/DP/PT 2+ and equal bilaterally. No clubbing or cyanosis. No edema ***  Skin: Warm and dry, no rash. Neuro: Strength intact.  Assessment & Plan   ***  Disposition: ***     {Are you ordering a CV Procedure (e.g. stress test, cath, DCCV, TEE, etc)?   Press F2        :[id number]}   Signed, Barnie HERO. Seferino Oscar, DNP, NP-C

## 2024-05-25 ENCOUNTER — Encounter

## 2024-05-25 ENCOUNTER — Ambulatory Visit: Attending: Student | Admitting: Student

## 2024-05-25 DIAGNOSIS — E785 Hyperlipidemia, unspecified: Secondary | ICD-10-CM | POA: Diagnosis not present

## 2024-05-25 DIAGNOSIS — J309 Allergic rhinitis, unspecified: Secondary | ICD-10-CM | POA: Diagnosis not present

## 2024-05-25 DIAGNOSIS — D759 Disease of blood and blood-forming organs, unspecified: Secondary | ICD-10-CM | POA: Diagnosis not present

## 2024-05-25 DIAGNOSIS — R011 Cardiac murmur, unspecified: Secondary | ICD-10-CM | POA: Diagnosis not present

## 2024-05-25 DIAGNOSIS — I69351 Hemiplegia and hemiparesis following cerebral infarction affecting right dominant side: Secondary | ICD-10-CM | POA: Diagnosis not present

## 2024-05-25 DIAGNOSIS — C4490 Unspecified malignant neoplasm of skin, unspecified: Secondary | ICD-10-CM | POA: Diagnosis not present

## 2024-05-25 DIAGNOSIS — N390 Urinary tract infection, site not specified: Secondary | ICD-10-CM | POA: Diagnosis not present

## 2024-05-25 DIAGNOSIS — D682 Hereditary deficiency of other clotting factors: Secondary | ICD-10-CM | POA: Diagnosis not present

## 2024-05-26 DIAGNOSIS — D759 Disease of blood and blood-forming organs, unspecified: Secondary | ICD-10-CM | POA: Diagnosis not present

## 2024-05-26 DIAGNOSIS — I69351 Hemiplegia and hemiparesis following cerebral infarction affecting right dominant side: Secondary | ICD-10-CM | POA: Diagnosis not present

## 2024-05-26 DIAGNOSIS — E785 Hyperlipidemia, unspecified: Secondary | ICD-10-CM | POA: Diagnosis not present

## 2024-05-26 DIAGNOSIS — C4490 Unspecified malignant neoplasm of skin, unspecified: Secondary | ICD-10-CM | POA: Diagnosis not present

## 2024-05-26 DIAGNOSIS — N390 Urinary tract infection, site not specified: Secondary | ICD-10-CM | POA: Diagnosis not present

## 2024-05-26 DIAGNOSIS — D682 Hereditary deficiency of other clotting factors: Secondary | ICD-10-CM | POA: Diagnosis not present

## 2024-05-26 DIAGNOSIS — R011 Cardiac murmur, unspecified: Secondary | ICD-10-CM | POA: Diagnosis not present

## 2024-05-26 DIAGNOSIS — Z471 Aftercare following joint replacement surgery: Secondary | ICD-10-CM | POA: Diagnosis not present

## 2024-05-26 DIAGNOSIS — J309 Allergic rhinitis, unspecified: Secondary | ICD-10-CM | POA: Diagnosis not present

## 2024-05-27 ENCOUNTER — Encounter

## 2024-05-27 DIAGNOSIS — C4490 Unspecified malignant neoplasm of skin, unspecified: Secondary | ICD-10-CM | POA: Diagnosis not present

## 2024-05-27 DIAGNOSIS — I69351 Hemiplegia and hemiparesis following cerebral infarction affecting right dominant side: Secondary | ICD-10-CM | POA: Diagnosis not present

## 2024-05-27 DIAGNOSIS — E785 Hyperlipidemia, unspecified: Secondary | ICD-10-CM | POA: Diagnosis not present

## 2024-05-27 DIAGNOSIS — D759 Disease of blood and blood-forming organs, unspecified: Secondary | ICD-10-CM | POA: Diagnosis not present

## 2024-05-27 DIAGNOSIS — N390 Urinary tract infection, site not specified: Secondary | ICD-10-CM | POA: Diagnosis not present

## 2024-05-27 DIAGNOSIS — Z471 Aftercare following joint replacement surgery: Secondary | ICD-10-CM | POA: Diagnosis not present

## 2024-05-27 DIAGNOSIS — R011 Cardiac murmur, unspecified: Secondary | ICD-10-CM | POA: Diagnosis not present

## 2024-05-27 DIAGNOSIS — J309 Allergic rhinitis, unspecified: Secondary | ICD-10-CM | POA: Diagnosis not present

## 2024-05-27 DIAGNOSIS — D682 Hereditary deficiency of other clotting factors: Secondary | ICD-10-CM | POA: Diagnosis not present

## 2024-06-01 ENCOUNTER — Encounter

## 2024-06-01 DIAGNOSIS — D759 Disease of blood and blood-forming organs, unspecified: Secondary | ICD-10-CM | POA: Diagnosis not present

## 2024-06-01 DIAGNOSIS — Z7901 Long term (current) use of anticoagulants: Secondary | ICD-10-CM | POA: Diagnosis not present

## 2024-06-01 DIAGNOSIS — E785 Hyperlipidemia, unspecified: Secondary | ICD-10-CM | POA: Diagnosis not present

## 2024-06-01 DIAGNOSIS — I69351 Hemiplegia and hemiparesis following cerebral infarction affecting right dominant side: Secondary | ICD-10-CM | POA: Diagnosis not present

## 2024-06-01 DIAGNOSIS — D689 Coagulation defect, unspecified: Secondary | ICD-10-CM | POA: Diagnosis not present

## 2024-06-01 DIAGNOSIS — Z471 Aftercare following joint replacement surgery: Secondary | ICD-10-CM | POA: Diagnosis not present

## 2024-06-01 DIAGNOSIS — J309 Allergic rhinitis, unspecified: Secondary | ICD-10-CM | POA: Diagnosis not present

## 2024-06-01 DIAGNOSIS — N390 Urinary tract infection, site not specified: Secondary | ICD-10-CM | POA: Diagnosis not present

## 2024-06-01 DIAGNOSIS — D682 Hereditary deficiency of other clotting factors: Secondary | ICD-10-CM | POA: Diagnosis not present

## 2024-06-01 DIAGNOSIS — C4490 Unspecified malignant neoplasm of skin, unspecified: Secondary | ICD-10-CM | POA: Diagnosis not present

## 2024-06-01 DIAGNOSIS — R011 Cardiac murmur, unspecified: Secondary | ICD-10-CM | POA: Diagnosis not present

## 2024-06-02 DIAGNOSIS — E785 Hyperlipidemia, unspecified: Secondary | ICD-10-CM | POA: Diagnosis not present

## 2024-06-02 DIAGNOSIS — Z471 Aftercare following joint replacement surgery: Secondary | ICD-10-CM | POA: Diagnosis not present

## 2024-06-02 DIAGNOSIS — N390 Urinary tract infection, site not specified: Secondary | ICD-10-CM | POA: Diagnosis not present

## 2024-06-02 DIAGNOSIS — R011 Cardiac murmur, unspecified: Secondary | ICD-10-CM | POA: Diagnosis not present

## 2024-06-02 DIAGNOSIS — J309 Allergic rhinitis, unspecified: Secondary | ICD-10-CM | POA: Diagnosis not present

## 2024-06-02 DIAGNOSIS — D682 Hereditary deficiency of other clotting factors: Secondary | ICD-10-CM | POA: Diagnosis not present

## 2024-06-02 DIAGNOSIS — I69351 Hemiplegia and hemiparesis following cerebral infarction affecting right dominant side: Secondary | ICD-10-CM | POA: Diagnosis not present

## 2024-06-02 DIAGNOSIS — C4490 Unspecified malignant neoplasm of skin, unspecified: Secondary | ICD-10-CM | POA: Diagnosis not present

## 2024-06-02 DIAGNOSIS — D759 Disease of blood and blood-forming organs, unspecified: Secondary | ICD-10-CM | POA: Diagnosis not present

## 2024-06-03 ENCOUNTER — Encounter

## 2024-06-03 DIAGNOSIS — I69351 Hemiplegia and hemiparesis following cerebral infarction affecting right dominant side: Secondary | ICD-10-CM | POA: Diagnosis not present

## 2024-06-03 DIAGNOSIS — R011 Cardiac murmur, unspecified: Secondary | ICD-10-CM | POA: Diagnosis not present

## 2024-06-03 DIAGNOSIS — N390 Urinary tract infection, site not specified: Secondary | ICD-10-CM | POA: Diagnosis not present

## 2024-06-03 DIAGNOSIS — D682 Hereditary deficiency of other clotting factors: Secondary | ICD-10-CM | POA: Diagnosis not present

## 2024-06-03 DIAGNOSIS — J309 Allergic rhinitis, unspecified: Secondary | ICD-10-CM | POA: Diagnosis not present

## 2024-06-03 DIAGNOSIS — D759 Disease of blood and blood-forming organs, unspecified: Secondary | ICD-10-CM | POA: Diagnosis not present

## 2024-06-03 DIAGNOSIS — E785 Hyperlipidemia, unspecified: Secondary | ICD-10-CM | POA: Diagnosis not present

## 2024-06-03 DIAGNOSIS — C4490 Unspecified malignant neoplasm of skin, unspecified: Secondary | ICD-10-CM | POA: Diagnosis not present

## 2024-06-07 DIAGNOSIS — C4490 Unspecified malignant neoplasm of skin, unspecified: Secondary | ICD-10-CM | POA: Diagnosis not present

## 2024-06-07 DIAGNOSIS — D682 Hereditary deficiency of other clotting factors: Secondary | ICD-10-CM | POA: Diagnosis not present

## 2024-06-07 DIAGNOSIS — D759 Disease of blood and blood-forming organs, unspecified: Secondary | ICD-10-CM | POA: Diagnosis not present

## 2024-06-07 DIAGNOSIS — N390 Urinary tract infection, site not specified: Secondary | ICD-10-CM | POA: Diagnosis not present

## 2024-06-07 DIAGNOSIS — R011 Cardiac murmur, unspecified: Secondary | ICD-10-CM | POA: Diagnosis not present

## 2024-06-07 DIAGNOSIS — J309 Allergic rhinitis, unspecified: Secondary | ICD-10-CM | POA: Diagnosis not present

## 2024-06-07 DIAGNOSIS — Z471 Aftercare following joint replacement surgery: Secondary | ICD-10-CM | POA: Diagnosis not present

## 2024-06-07 DIAGNOSIS — E785 Hyperlipidemia, unspecified: Secondary | ICD-10-CM | POA: Diagnosis not present

## 2024-06-07 DIAGNOSIS — I69351 Hemiplegia and hemiparesis following cerebral infarction affecting right dominant side: Secondary | ICD-10-CM | POA: Diagnosis not present

## 2024-06-08 ENCOUNTER — Encounter

## 2024-06-09 DIAGNOSIS — J309 Allergic rhinitis, unspecified: Secondary | ICD-10-CM | POA: Diagnosis not present

## 2024-06-09 DIAGNOSIS — R011 Cardiac murmur, unspecified: Secondary | ICD-10-CM | POA: Diagnosis not present

## 2024-06-09 DIAGNOSIS — C4490 Unspecified malignant neoplasm of skin, unspecified: Secondary | ICD-10-CM | POA: Diagnosis not present

## 2024-06-09 DIAGNOSIS — E785 Hyperlipidemia, unspecified: Secondary | ICD-10-CM | POA: Diagnosis not present

## 2024-06-09 DIAGNOSIS — D682 Hereditary deficiency of other clotting factors: Secondary | ICD-10-CM | POA: Diagnosis not present

## 2024-06-09 DIAGNOSIS — N390 Urinary tract infection, site not specified: Secondary | ICD-10-CM | POA: Diagnosis not present

## 2024-06-09 DIAGNOSIS — D759 Disease of blood and blood-forming organs, unspecified: Secondary | ICD-10-CM | POA: Diagnosis not present

## 2024-06-09 DIAGNOSIS — Z471 Aftercare following joint replacement surgery: Secondary | ICD-10-CM | POA: Diagnosis not present

## 2024-06-09 DIAGNOSIS — I69351 Hemiplegia and hemiparesis following cerebral infarction affecting right dominant side: Secondary | ICD-10-CM | POA: Diagnosis not present

## 2024-06-10 ENCOUNTER — Encounter

## 2024-06-10 DIAGNOSIS — C4490 Unspecified malignant neoplasm of skin, unspecified: Secondary | ICD-10-CM | POA: Diagnosis not present

## 2024-06-10 DIAGNOSIS — J309 Allergic rhinitis, unspecified: Secondary | ICD-10-CM | POA: Diagnosis not present

## 2024-06-10 DIAGNOSIS — Z471 Aftercare following joint replacement surgery: Secondary | ICD-10-CM | POA: Diagnosis not present

## 2024-06-10 DIAGNOSIS — D682 Hereditary deficiency of other clotting factors: Secondary | ICD-10-CM | POA: Diagnosis not present

## 2024-06-10 DIAGNOSIS — E785 Hyperlipidemia, unspecified: Secondary | ICD-10-CM | POA: Diagnosis not present

## 2024-06-10 DIAGNOSIS — N390 Urinary tract infection, site not specified: Secondary | ICD-10-CM | POA: Diagnosis not present

## 2024-06-10 DIAGNOSIS — D759 Disease of blood and blood-forming organs, unspecified: Secondary | ICD-10-CM | POA: Diagnosis not present

## 2024-06-10 DIAGNOSIS — I69351 Hemiplegia and hemiparesis following cerebral infarction affecting right dominant side: Secondary | ICD-10-CM | POA: Diagnosis not present

## 2024-06-10 DIAGNOSIS — R011 Cardiac murmur, unspecified: Secondary | ICD-10-CM | POA: Diagnosis not present

## 2024-06-15 ENCOUNTER — Encounter: Admitting: Physical Therapy

## 2024-06-15 DIAGNOSIS — D682 Hereditary deficiency of other clotting factors: Secondary | ICD-10-CM | POA: Diagnosis not present

## 2024-06-15 DIAGNOSIS — R011 Cardiac murmur, unspecified: Secondary | ICD-10-CM | POA: Diagnosis not present

## 2024-06-15 DIAGNOSIS — D759 Disease of blood and blood-forming organs, unspecified: Secondary | ICD-10-CM | POA: Diagnosis not present

## 2024-06-15 DIAGNOSIS — N390 Urinary tract infection, site not specified: Secondary | ICD-10-CM | POA: Diagnosis not present

## 2024-06-15 DIAGNOSIS — Z471 Aftercare following joint replacement surgery: Secondary | ICD-10-CM | POA: Diagnosis not present

## 2024-06-15 DIAGNOSIS — C4490 Unspecified malignant neoplasm of skin, unspecified: Secondary | ICD-10-CM | POA: Diagnosis not present

## 2024-06-15 DIAGNOSIS — J309 Allergic rhinitis, unspecified: Secondary | ICD-10-CM | POA: Diagnosis not present

## 2024-06-15 DIAGNOSIS — I69351 Hemiplegia and hemiparesis following cerebral infarction affecting right dominant side: Secondary | ICD-10-CM | POA: Diagnosis not present

## 2024-06-15 DIAGNOSIS — E785 Hyperlipidemia, unspecified: Secondary | ICD-10-CM | POA: Diagnosis not present

## 2024-06-16 DIAGNOSIS — J309 Allergic rhinitis, unspecified: Secondary | ICD-10-CM | POA: Diagnosis not present

## 2024-06-16 DIAGNOSIS — D682 Hereditary deficiency of other clotting factors: Secondary | ICD-10-CM | POA: Diagnosis not present

## 2024-06-16 DIAGNOSIS — D759 Disease of blood and blood-forming organs, unspecified: Secondary | ICD-10-CM | POA: Diagnosis not present

## 2024-06-16 DIAGNOSIS — E785 Hyperlipidemia, unspecified: Secondary | ICD-10-CM | POA: Diagnosis not present

## 2024-06-16 DIAGNOSIS — R011 Cardiac murmur, unspecified: Secondary | ICD-10-CM | POA: Diagnosis not present

## 2024-06-16 DIAGNOSIS — I69351 Hemiplegia and hemiparesis following cerebral infarction affecting right dominant side: Secondary | ICD-10-CM | POA: Diagnosis not present

## 2024-06-16 DIAGNOSIS — C4490 Unspecified malignant neoplasm of skin, unspecified: Secondary | ICD-10-CM | POA: Diagnosis not present

## 2024-06-16 DIAGNOSIS — N390 Urinary tract infection, site not specified: Secondary | ICD-10-CM | POA: Diagnosis not present

## 2024-06-17 ENCOUNTER — Encounter

## 2024-06-17 DIAGNOSIS — N390 Urinary tract infection, site not specified: Secondary | ICD-10-CM | POA: Diagnosis not present

## 2024-06-17 DIAGNOSIS — Z471 Aftercare following joint replacement surgery: Secondary | ICD-10-CM | POA: Diagnosis not present

## 2024-06-17 DIAGNOSIS — R011 Cardiac murmur, unspecified: Secondary | ICD-10-CM | POA: Diagnosis not present

## 2024-06-17 DIAGNOSIS — I69351 Hemiplegia and hemiparesis following cerebral infarction affecting right dominant side: Secondary | ICD-10-CM | POA: Diagnosis not present

## 2024-06-17 DIAGNOSIS — J309 Allergic rhinitis, unspecified: Secondary | ICD-10-CM | POA: Diagnosis not present

## 2024-06-17 DIAGNOSIS — D759 Disease of blood and blood-forming organs, unspecified: Secondary | ICD-10-CM | POA: Diagnosis not present

## 2024-06-17 DIAGNOSIS — D682 Hereditary deficiency of other clotting factors: Secondary | ICD-10-CM | POA: Diagnosis not present

## 2024-06-17 DIAGNOSIS — E785 Hyperlipidemia, unspecified: Secondary | ICD-10-CM | POA: Diagnosis not present

## 2024-06-17 DIAGNOSIS — C4490 Unspecified malignant neoplasm of skin, unspecified: Secondary | ICD-10-CM | POA: Diagnosis not present

## 2024-06-22 ENCOUNTER — Encounter

## 2024-06-24 ENCOUNTER — Encounter

## 2024-06-29 ENCOUNTER — Encounter

## 2024-07-01 ENCOUNTER — Encounter

## 2024-07-06 ENCOUNTER — Encounter

## 2024-07-08 ENCOUNTER — Encounter

## 2024-07-13 ENCOUNTER — Encounter

## 2024-07-15 ENCOUNTER — Encounter

## 2024-07-20 ENCOUNTER — Encounter

## 2024-07-22 ENCOUNTER — Encounter

## 2024-07-27 ENCOUNTER — Encounter

## 2024-07-29 ENCOUNTER — Encounter

## 2024-08-01 DIAGNOSIS — D6851 Activated protein C resistance: Secondary | ICD-10-CM | POA: Diagnosis not present

## 2024-08-01 DIAGNOSIS — I69351 Hemiplegia and hemiparesis following cerebral infarction affecting right dominant side: Secondary | ICD-10-CM | POA: Diagnosis not present

## 2024-08-01 DIAGNOSIS — I10 Essential (primary) hypertension: Secondary | ICD-10-CM | POA: Diagnosis not present

## 2024-08-01 DIAGNOSIS — D689 Coagulation defect, unspecified: Secondary | ICD-10-CM | POA: Diagnosis not present

## 2024-08-01 DIAGNOSIS — E785 Hyperlipidemia, unspecified: Secondary | ICD-10-CM | POA: Diagnosis not present

## 2024-08-01 DIAGNOSIS — F325 Major depressive disorder, single episode, in full remission: Secondary | ICD-10-CM | POA: Diagnosis not present

## 2024-08-01 DIAGNOSIS — E669 Obesity, unspecified: Secondary | ICD-10-CM | POA: Diagnosis not present

## 2024-08-01 DIAGNOSIS — R32 Unspecified urinary incontinence: Secondary | ICD-10-CM | POA: Diagnosis not present

## 2024-08-01 DIAGNOSIS — I2510 Atherosclerotic heart disease of native coronary artery without angina pectoris: Secondary | ICD-10-CM | POA: Diagnosis not present

## 2024-08-03 ENCOUNTER — Encounter

## 2024-08-05 ENCOUNTER — Encounter

## 2024-08-10 ENCOUNTER — Encounter

## 2024-09-07 DIAGNOSIS — Z7901 Long term (current) use of anticoagulants: Secondary | ICD-10-CM | POA: Diagnosis not present

## 2024-09-07 DIAGNOSIS — D689 Coagulation defect, unspecified: Secondary | ICD-10-CM | POA: Diagnosis not present

## 2024-10-04 ENCOUNTER — Other Ambulatory Visit (HOSPITAL_COMMUNITY): Payer: Self-pay | Admitting: Internal Medicine

## 2024-10-05 ENCOUNTER — Encounter: Payer: Self-pay | Admitting: Internal Medicine

## 2024-10-08 ENCOUNTER — Encounter: Payer: Self-pay | Admitting: Internal Medicine

## 2024-10-08 ENCOUNTER — Telehealth (HOSPITAL_COMMUNITY): Payer: Self-pay

## 2024-10-08 NOTE — Telephone Encounter (Signed)
 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
